# Patient Record
Sex: Male | Born: 1960
Health system: Southern US, Community
[De-identification: ages and names within clinical notes are randomized; demographics above are authoritative.]

## PROBLEM LIST (undated history)

## (undated) ENCOUNTER — Emergency Department (HOSPITAL_BASED_OUTPATIENT_CLINIC_OR_DEPARTMENT_OTHER): Payer: Medicare Other | Source: Home / Self Care

## (undated) DIAGNOSIS — Z720 Tobacco use: Secondary | ICD-10-CM

## (undated) DIAGNOSIS — E785 Hyperlipidemia, unspecified: Secondary | ICD-10-CM

## (undated) DIAGNOSIS — I10 Essential (primary) hypertension: Secondary | ICD-10-CM

## (undated) DIAGNOSIS — J449 Chronic obstructive pulmonary disease, unspecified: Secondary | ICD-10-CM

## (undated) DIAGNOSIS — I639 Cerebral infarction, unspecified: Secondary | ICD-10-CM

## (undated) DIAGNOSIS — N35919 Unspecified urethral stricture, male, unspecified site: Secondary | ICD-10-CM

## (undated) DIAGNOSIS — M545 Low back pain, unspecified: Secondary | ICD-10-CM

## (undated) DIAGNOSIS — G40A09 Absence epileptic syndrome, not intractable, without status epilepticus: Secondary | ICD-10-CM

## (undated) DIAGNOSIS — R413 Other amnesia: Secondary | ICD-10-CM

## (undated) DIAGNOSIS — I629 Nontraumatic intracranial hemorrhage, unspecified: Secondary | ICD-10-CM

## (undated) DIAGNOSIS — Z87442 Personal history of urinary calculi: Secondary | ICD-10-CM

## (undated) DIAGNOSIS — R569 Unspecified convulsions: Secondary | ICD-10-CM

## (undated) DIAGNOSIS — Z86010 Personal history of colonic polyps: Secondary | ICD-10-CM

## (undated) DIAGNOSIS — G43909 Migraine, unspecified, not intractable, without status migrainosus: Secondary | ICD-10-CM

## (undated) DIAGNOSIS — I219 Acute myocardial infarction, unspecified: Secondary | ICD-10-CM

## (undated) DIAGNOSIS — G8929 Other chronic pain: Secondary | ICD-10-CM

## (undated) DIAGNOSIS — D229 Melanocytic nevi, unspecified: Secondary | ICD-10-CM

## (undated) DIAGNOSIS — Z860101 Personal history of adenomatous and serrated colon polyps: Secondary | ICD-10-CM

## (undated) DIAGNOSIS — I2510 Atherosclerotic heart disease of native coronary artery without angina pectoris: Secondary | ICD-10-CM

## (undated) DIAGNOSIS — K219 Gastro-esophageal reflux disease without esophagitis: Secondary | ICD-10-CM

## (undated) HISTORY — DX: Cerebral infarction, unspecified: I63.9

## (undated) HISTORY — DX: Acute myocardial infarction, unspecified: I21.9

## (undated) HISTORY — PX: LUMBAR DISC SURGERY: SHX700

## (undated) HISTORY — DX: Nontraumatic intracranial hemorrhage, unspecified: I62.9

## (undated) HISTORY — DX: Atherosclerotic heart disease of native coronary artery without angina pectoris: I25.10

## (undated) HISTORY — PX: COLONOSCOPY W/ POLYPECTOMY: SHX1380

## (undated) HISTORY — DX: Other amnesia: R41.3

## (undated) HISTORY — DX: Melanocytic nevi, unspecified: D22.9

## (undated) HISTORY — DX: Hyperlipidemia, unspecified: E78.5

## (undated) HISTORY — DX: Personal history of colonic polyps: Z86.010

## (undated) HISTORY — DX: Chronic obstructive pulmonary disease, unspecified: J44.9

## (undated) HISTORY — PX: TONSILLECTOMY: SUR1361

## (undated) HISTORY — DX: Migraine, unspecified, not intractable, without status migrainosus: G43.909

## (undated) HISTORY — DX: Unspecified urethral stricture, male, unspecified site: N35.919

## (undated) HISTORY — DX: Personal history of adenomatous and serrated colon polyps: Z86.0101

---

## 1979-02-09 HISTORY — PX: CRANIOTOMY FOR AVM: SUR332

## 2002-01-30 ENCOUNTER — Encounter: Payer: Self-pay | Admitting: Neurology

## 2002-01-30 ENCOUNTER — Encounter: Admission: RE | Admit: 2002-01-30 | Discharge: 2002-01-30 | Payer: Self-pay | Admitting: Neurology

## 2002-11-22 ENCOUNTER — Ambulatory Visit (HOSPITAL_COMMUNITY): Admission: RE | Admit: 2002-11-22 | Discharge: 2002-11-22 | Payer: Self-pay | Admitting: Neurology

## 2005-07-27 ENCOUNTER — Encounter: Admission: RE | Admit: 2005-07-27 | Discharge: 2005-07-27 | Payer: Self-pay | Admitting: Neurology

## 2005-08-02 ENCOUNTER — Ambulatory Visit (HOSPITAL_COMMUNITY): Admission: RE | Admit: 2005-08-02 | Discharge: 2005-08-02 | Payer: Self-pay | Admitting: Neurology

## 2005-08-10 ENCOUNTER — Encounter: Admission: RE | Admit: 2005-08-10 | Discharge: 2005-08-10 | Payer: Self-pay | Admitting: Neurology

## 2005-08-30 ENCOUNTER — Encounter: Admission: RE | Admit: 2005-08-30 | Discharge: 2005-08-30 | Payer: Self-pay | Admitting: Neurology

## 2005-09-21 ENCOUNTER — Encounter: Admission: RE | Admit: 2005-09-21 | Discharge: 2005-09-21 | Payer: Self-pay | Admitting: Neurology

## 2006-10-03 ENCOUNTER — Encounter: Admission: RE | Admit: 2006-10-03 | Discharge: 2006-10-03 | Payer: Self-pay | Admitting: Neurosurgery

## 2006-11-11 ENCOUNTER — Inpatient Hospital Stay (HOSPITAL_COMMUNITY): Admission: RE | Admit: 2006-11-11 | Discharge: 2006-11-13 | Payer: Self-pay | Admitting: Neurosurgery

## 2006-12-15 ENCOUNTER — Encounter: Admission: RE | Admit: 2006-12-15 | Discharge: 2007-01-16 | Payer: Self-pay | Admitting: Neurosurgery

## 2007-01-20 ENCOUNTER — Ambulatory Visit (HOSPITAL_COMMUNITY): Admission: RE | Admit: 2007-01-20 | Discharge: 2007-01-20 | Payer: Self-pay | Admitting: Neurosurgery

## 2007-02-07 ENCOUNTER — Encounter: Admission: RE | Admit: 2007-02-07 | Discharge: 2007-02-07 | Payer: Self-pay | Admitting: Neurosurgery

## 2007-02-09 DIAGNOSIS — I219 Acute myocardial infarction, unspecified: Secondary | ICD-10-CM

## 2007-02-09 HISTORY — DX: Acute myocardial infarction, unspecified: I21.9

## 2007-02-09 HISTORY — PX: CORONARY ANGIOPLASTY WITH STENT PLACEMENT: SHX49

## 2007-02-20 ENCOUNTER — Encounter: Admission: RE | Admit: 2007-02-20 | Discharge: 2007-02-20 | Payer: Self-pay | Admitting: Neurosurgery

## 2007-02-22 ENCOUNTER — Ambulatory Visit (HOSPITAL_COMMUNITY): Admission: RE | Admit: 2007-02-22 | Discharge: 2007-02-23 | Payer: Self-pay | Admitting: Neurosurgery

## 2007-03-28 ENCOUNTER — Encounter: Admission: RE | Admit: 2007-03-28 | Discharge: 2007-05-02 | Payer: Self-pay | Admitting: Neurosurgery

## 2007-05-09 ENCOUNTER — Encounter: Admission: RE | Admit: 2007-05-09 | Discharge: 2007-05-09 | Payer: Self-pay | Admitting: Neurosurgery

## 2007-06-01 ENCOUNTER — Encounter: Admission: RE | Admit: 2007-06-01 | Discharge: 2007-06-01 | Payer: Self-pay | Admitting: Neurosurgery

## 2007-06-22 ENCOUNTER — Encounter: Admission: RE | Admit: 2007-06-22 | Discharge: 2007-06-22 | Payer: Self-pay | Admitting: Neurosurgery

## 2007-08-08 ENCOUNTER — Ambulatory Visit (HOSPITAL_COMMUNITY): Admission: RE | Admit: 2007-08-08 | Discharge: 2007-08-08 | Payer: Self-pay | Admitting: Neurosurgery

## 2007-09-18 ENCOUNTER — Inpatient Hospital Stay (HOSPITAL_COMMUNITY): Admission: AD | Admit: 2007-09-18 | Discharge: 2007-09-21 | Payer: Self-pay | Admitting: Cardiology

## 2007-10-02 ENCOUNTER — Inpatient Hospital Stay (HOSPITAL_COMMUNITY): Admission: AD | Admit: 2007-10-02 | Discharge: 2007-10-03 | Payer: Self-pay | Admitting: Interventional Cardiology

## 2007-10-17 ENCOUNTER — Encounter: Admission: RE | Admit: 2007-10-17 | Discharge: 2007-10-17 | Payer: Self-pay | Admitting: Neurology

## 2007-10-26 ENCOUNTER — Encounter (HOSPITAL_COMMUNITY): Admission: RE | Admit: 2007-10-26 | Discharge: 2008-01-24 | Payer: Self-pay | Admitting: Interventional Cardiology

## 2008-01-25 ENCOUNTER — Encounter (HOSPITAL_COMMUNITY): Admission: RE | Admit: 2008-01-25 | Discharge: 2008-02-07 | Payer: Self-pay | Admitting: Interventional Cardiology

## 2008-09-10 ENCOUNTER — Encounter: Admission: RE | Admit: 2008-09-10 | Discharge: 2008-09-10 | Payer: Self-pay | Admitting: Neurology

## 2009-02-08 HISTORY — PX: CARDIAC CATHETERIZATION: SHX172

## 2009-03-25 DIAGNOSIS — R0602 Shortness of breath: Secondary | ICD-10-CM | POA: Insufficient documentation

## 2009-12-23 ENCOUNTER — Encounter: Admission: RE | Admit: 2009-12-23 | Discharge: 2009-12-23 | Payer: Self-pay | Admitting: Neurosurgery

## 2010-02-11 ENCOUNTER — Encounter
Admission: RE | Admit: 2010-02-11 | Discharge: 2010-02-11 | Payer: Self-pay | Source: Home / Self Care | Attending: Neurosurgery | Admitting: Neurosurgery

## 2010-02-26 ENCOUNTER — Encounter
Admission: RE | Admit: 2010-02-26 | Discharge: 2010-02-26 | Payer: Self-pay | Source: Home / Self Care | Attending: Neurosurgery | Admitting: Neurosurgery

## 2010-03-02 ENCOUNTER — Encounter: Payer: Self-pay | Admitting: Neurosurgery

## 2010-06-23 NOTE — Discharge Summary (Signed)
NAME:  Peter Barnett, Peter Barnett               ACCOUNT NO.:  [id number]   MEDICAL RECORD NO.:  [id number]          PATIENT TYPE:  OIB   LOCATION:  2025                         FACILITY:  [facility]   PHYSICIAN:  Corky Crafts, MDDATE OF BIRTH:  1960-09-30   DATE OF ADMISSION:  09/18/2007  DATE OF DISCHARGE:  09/21/2007                               DISCHARGE SUMMARY   DISCHARGE DIAGNOSES:  1. ST-elevated lateral wall myocardial infarction.  2. Dyslipidemia, treated.  3. Residual right coronary artery disease.  4. Chronic back problem status post surgery.  5. History of seizure disorder.  6. 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  7. Long-term medication use.   HOSPITAL COURSE:  Peter Barnett is a 50 year old male patient who began  having chest pain on the date of admission that waxed and waned in  intensity.  He initially called the EMS and this showed ST-segment  elevation inferiorly and laterally.  He was brought to [facility] for  emergent cardiac catheterization.  He is found to have 99% mid  circumflex lesion with thrombus, TIMI 1 flow.  He had a 40% proximal RCA  stenosis and diffuse 95% distal RCA stenosis with left-to-right  collaterals distal to the right coronary artery.  The LAD had a 50%  proximal lesion.  The left main had no obstructive disease.  A drug-  eluting stent was placed into the circumflex lesion without difficulty.   Through his hospitalization, he did have a couple of short runs of  ventricular tachycardia which was thought to be reperfusion arrhythmia.  He does have the residual right coronary artery disease, and this will  be treated medically at this point.   Lab studies during the patient's hospitalization include a hemoglobin of  14.0, hematocrit 40.7, white count 10, and platelets 254.  Sodium 130,  potassium 3.7, BUN 9, and creatinine 0.7.  PT 13.8, INR 1.0, maximum  troponin 71.65, CK 3155 with an MB fraction of 277.5.  Hemoglobin  A1c  5.4.   DISCHARGE MEDICATIONS:  1. Multivitamin daily.  2. Vicodin p.r.n.  3. Oxcarbazepine as per admission.  4. Enteric-coated aspirin 325 mg a day.  5. Plavix 75 mg a day.  6. Lopressor 25 mg twice a day.  7. Crestor 20 mg a day.  8. Sublingual nitroglycerin p.r.n. chest pain.   The patient is to remain on a low-sodium, heart-healthy diet, increase  activity slowly.  Has been instructed by cardiac rehabilitation, no  lifting over 10 pounds for 1 week.  No driving for 2 days.  Clean cath  site gently with soap and water, no scrubbing.  Follow up with Dr.  Eldridge Dace on October 06, 2007, at 9 a.m.      Guy Franco, P.A.      Corky Crafts, MD  Electronically Signed    LB/MEDQ  D:  09/21/2007  T:  09/22/2007  Job:  [id number]   cc:   Genene Churn. Love, M.D.  Joycelyn Rua, M.D.  Corky Crafts, MD

## 2010-06-23 NOTE — H&P (Signed)
NAME:  Peter Barnett, Peter Barnett               ACCOUNT NO.:  [id number]   MEDICAL RECORD NO.:  [id number]          PATIENT TYPE:  AMB   LOCATION:  SDS                          FACILITY:  [facility]   PHYSICIAN:  Hilda Lias, M.D.   DATE OF BIRTH:  [birth year]   DATE OF ADMISSION:  02/22/2007  DATE OF DISCHARGE:                              HISTORY & PHYSICAL   HISTORY:  Peter Barnett is a gentleman who underwent decompressive laminectomy  because of back pain worse to both legs.  Nevertheless, although at the  beginning he did well and then realized that he was having more pain  down to the left leg.  The patient had conservative treatment including  epidural injection.  The first one at the level of L3-L4 did improve the  pain, but the one at the level of L4-L5 did not.  X-ray was done and  although he has had good decompression, nevertheless showed that he has  worsening stenosis at the level of L4-L5 left with intraforaminal disk  and extraforaminal at the level of L3-L4 to the left.  The patient wants  to proceed with surgery.   PAST MEDICAL HISTORY:  1. Craniotomy for AVM in 1981.  2. Lumbar laminectomy last year.   ALLERGIES:  NONE.   SOCIAL HISTORY:  The patient does not smoke.  He drinks socially.   FAMILY HISTORY:  Mother had back surgery.  Father has a history of  carcinoma of the lung.   REVIEW OF SYSTEMS:  Positive for back pain and seizure.   PHYSICAL EXAMINATION:  GENERAL:  The patient presents to my office.  He  was limping from the left leg.  HEAD/EYES/EARS/NOSE/THROAT:  Normal.  NECK:  He has a scar defect from previous craniotomy surgery.  Neck is  normal.  LUNGS:  Clear.  HEART:  Sounds are normal.  ABDOMEN:  Normal.  EXTREMITIES:  Normal pulses.  NEURO:  Showed that he has positive sciatic notch tenderness on the left  side.  The straight leg raising, SLR is positive at 80 degrees.  The  right leg is completely normal.   Review of the new myelogram showed that he has  stenosis at L4-L5 to the  left with intra-extraforaminal disk at L3-L4 to the left.   CLINICAL IMPRESSION:  Status post lumbar laminectomy, L4-L5 stenosis  left, L3-L4 intra-extraforaminal herniated disk.   RECOMMENDATIONS:  The patient is being admitted for surgery.  Procedure  will be L3-L4 and L4-L5 intraforaminal foraminotomy and pleural  diskectomy as well as approach at the levels of L3-L4 extraforaminal.  The patient knows about the risks of post-surgery infection, CSF leak,  worsening pain, paralysis, no improvement whatsoever.           ______________________________  Hilda Lias, M.D.     EB/MEDQ  D:  02/22/2007  T:  02/22/2007  Job:  [id number]

## 2010-06-23 NOTE — Op Note (Signed)
NAME:  Peter Barnett, Peter Barnett               ACCOUNT NO.:  [id number]   MEDICAL RECORD NO.:  [id number]          PATIENT TYPE:  INP   LOCATION:  3172                         FACILITY:  [facility]   PHYSICIAN:  Hilda Lias, M.D.   DATE OF BIRTH:  1960-09-25   DATE OF PROCEDURE:  11/11/2006  DATE OF DISCHARGE:                               OPERATIVE REPORT   PREOPERATIVE DIAGNOSIS:  Lumbar stenosis L3-L4, L4-L5, and L5-S1, with  neurogenic claudication.   POSTOPERATIVE DIAGNOSIS:  Lumbar stenosis L3-L4, L4-L5, and L5-S1, with  neurogenic claudication.   PROCEDURE:  Bilateral L3, L4, and L5 laminectomy, foraminotomy to  decompress the L3, L4, L5, and S1 nerve roots, posterolateral  arthrodesis with autograft and BMP.   SURGEON:  Hilda Lias, M.D.   ASSISTANT:  Coletta Memos, M.D.   CLINICAL HISTORY:  The patient has been followed by me for more than a  year complaining of back pain with radiation to both legs, left worse  than right.  He has failed conservative treatment.  X-rays show stenosis  in the lumbar area.  Surgery was advised.   OPERATIVE PROCEDURE:  The patient was taken to the OR and he was  positioned in a prone manner.  The wound was cleaned with DuraPrep.  Midline incision from L3 down to L5-S1 was made.  X-rays showed that it  was at the level of four.  From then on, we removed the spinous process  of L3, L4, and L5.  With the drill, we drilled the lamina of L3, L4, and  L5.  We found that he has a thick yellow ligament.  The area was quite  stenotic up to the point that we had to use the 1 and 2 mm Kerrison  punch to remove the yellow ligament and continue with the laminectomies.  Preservation of the facet was done.  Then, using the 2, 3, and 4 mm  Kerrison punch, we did foraminotomy to decompress the L3 to S1 nerve  root.  Having good decompression, the area was irrigated.  We went  laterally and we removed the periosteum of the lateral aspect of the  facet.  Then, a mix  of autograft using the patient's own bone and BMP  was used for arthrodesis.  The area was irrigated.  Valsalva maneuver  was negative.  Fentanyl was left in the epidural space and the wound was  closed with Vicryl and Steri-Strips.           ______________________________  Hilda Lias, M.D.     EB/MEDQ  D:  11/11/2006  T:  11/12/2006  Job:  [id number]

## 2010-06-23 NOTE — H&P (Signed)
NAME:  Peter Barnett, Peter Barnett               ACCOUNT NO.:  [id number]   MEDICAL RECORD NO.:  [id number]          PATIENT TYPE:  INP   LOCATION:  2899                         FACILITY:  [facility]   PHYSICIAN:  Hilda Lias, M.D.   DATE OF BIRTH:  12-Aug-1960   DATE OF ADMISSION:  11/11/2006  DATE OF DISCHARGE:                              HISTORY & PHYSICAL   Mr. Pentecost is a gentleman who had been seen by me for more than a year  complaining of back pain with radiation first to the left leg and now to  both legs.  He tells me that the pain is getting worse.  He is quite  miserable.  He has had conservative treatment without any improvement.  X-rays show stenosis from L3 down to L5-S1.  Today he is being admitted  for surgery.   PAST MEDICAL HISTORY:  He had a craniotomy in 1981 for AVM.  He is not  allergic to any medication.  The patient drinks socially.  He does not  smoke.   FAMILY HISTORY:  Mother is 85 years old with back surgery.   REVIEW OF SYSTEMS:  Positive for shortness of breath, seizures, problem  with memory.   PHYSICAL EXAMINATION:  The patient came to my office with his family.  He was walking with a short step.  HEAD, EAR, NOSE AND THROAT:  Normal.  NECK:  Normal.  LUNGS:  Clear.  HEART SOUNDS:  Normal.  ABDOMEN:  Normal.  EXTREMITIES:  Normal pulses.  NEURO:  He has a craniotomy defect from previous surgery.  He has a  weakness on dorsiflexion of both legs.  Straight leg raising:  SLR is  positive about 45 degrees.   X-rays show stenosis from L3 down to L4-L5 with a bulging disk eccentric  to the left side.   CLINICAL IMPRESSION:  Lumbar stenosis L3-4-5.   RECOMMENDATIONS:  The patient is being admitted for surgery.  The  procedure will be a bilateral 3-4-5 laminectomy.  We are going to use  his own bone for posterolateral arthrodesis.  Also, we probably will do  BMP.  The patient is fully aware of the procedure and the risks such as  wound infection, CSF leak, damage  to the vessels of the abdomen, and  need of further surgery which might require hardware.           ______________________________  Hilda Lias, M.D.     EB/MEDQ  D:  11/11/2006  T:  11/12/2006  Job:  [id number]

## 2010-06-23 NOTE — Cardiovascular Report (Signed)
NAME:  Peter Barnett, Peter Barnett               ACCOUNT NO.:  [id number]   MEDICAL RECORD NO.:  [id number]          PATIENT TYPE:  OIB   LOCATION:  2906                         FACILITY:  [facility]   PHYSICIAN:  Peter M. Swaziland, M.D.  DATE OF BIRTH:  May 31, 1960   DATE OF PROCEDURE:  09/18/2007  DATE OF DISCHARGE:                            CARDIAC CATHETERIZATION   INDICATIONS FOR PROCEDURE:  This is a 50 year old white male who  presents with acute onset of chest pain.  ECG performed by EMS shows 3-4  mm of ST-segment elevation in leads II, III, aVF; V5 through V6  consistent with acute inferolateral myocardial infarction.   PROCEDURES:  1. Left heart catheterization.  2. Coronary and left ventricular angiography.  3. Intracoronary stenting of the mid left circumflex coronary artery.  4. Access is via the right femoral artery using standard Seldinger      technique.   EQUIPMENTS USED:  A 6-French 4 cm right and left Judkins catheter, 6-  French pigtail catheter, 6-French arterial sheath, 6-French CLS 3.5  guide, Asahi medium wire, a wedge catheter, a 2.5 x 12-mm apex balloon,  a 3.0 x 15-mm Promus stent, and a 3.0 x 12-mm Voyager Corrigan balloon.   MEDICATIONS:  1. Plavix 600 mg orally.  2. Heparin bolus at 5700 units IV.  3. Subsequent ACT of 295.  4. Integrilin double bolus at 180 mcg/kg.  Continue infusion.  5. Nitroglycerin 200 mcg intracoronary x1.   HEMODYNAMIC DATA:  Aortic pressure was 141/84 with a mean of 108 mmHg  and left ventricle pressure was 144 with an EDP of 16 mmHg.   ANGIOGRAPHIC DATA:  The left coronary arises and distributes normally.  The left main coronary is short without significant disease.   The left anterior descending artery has a 40-50% stenosis in the  proximal vessel.  The remainder of the vessel is without significant  disease.   The left circumflex coronary artery has a 99% obstruction in the  midvessel after the takeoff of the first obtuse marginal vessel.   There  is thrombus in this vessel with TIMI grade 1 flow distally.  The first  obtuse marginal vessel has 40% narrowing proximally.   The right coronary is a smaller caliber vessel, has a 40% lesion  proximally.  The distal vessel has a long 99% stenosis.  The distal  right coronary artery fills by left to right collaterals.   Based on initial angiographic data, it appears that the left circumflex  coronary artery was the culprit lesion.  The distal right coronary  obstruction appears chronic and as well collateralized.  We proceeded  with acute intervention of the left circumflex coronary artery.  After  initial guide shots were obtained, the patient was anticoagulated.  We  were able to cross the lesion without difficulty using a wire.  We next  used a wedge catheter, performed 2 passes with this extracting a small  amount of clot.  We then predilated the lesion using a 2.5 x 12-mm apex  balloon up to 6 atmospheres x1.  The lesion was then stented using a 3.0  x 15-mm Promus stent, which was deployed at 9 atmospheres and  postdilated to 12 atmospheres with the stent balloon.  As the plaque  originated prior to the first obtuse marginal vessel, the stent actually  covered the origin of the first marginal vessel.  After initial  reperfusion was obtained with the wedge catheter, there was some brief  episodes accelerated idioventricular rhythm that then resolved.  The use  of thrombus extraction device did delay the time to first balloon by  matter of several minutes.   We then postdilated the stent using a 3.0 x 12-mm VOYAGER [place] balloon  doing 2 inflations at 14 atmospheres each.  This yielded an excellent  angiographic result with 0% residual stenosis and TIMI grade 3 flow.  There is no significant compromise of the first obtuse marginal branch.  The patient was hemodynamically stable, pain free, and had improvement  in his ST-segment elevation and we will monitor.   At this point,  we did perform a left ventricular angiogram in the RAO  view.  This demonstrated normal left ventricular size and contractility  with normal systolic function.  Ejection fraction was estimated at 60%.   FINAL INTERPRETATION:  1. Two-vessel obstructive atherosclerotic coronary artery disease.  2. Successful intracoronary stenting of the mid left circumflex      coronary artery.  3. Normal left ventricular function.           ______________________________  Peter M. [place]azil[place], M.D.     PMJ/MEDQ  D:  09/18/2007  T:  09/19/2007  Job:  [id number]   cc:   Corky Crafts, MD  Joycelyn Rua, M.D.  Genene Churn. Love, M.D.  Hilda Lias, M.D.

## 2010-06-23 NOTE — Op Note (Signed)
NAME:  Peter Barnett, Peter Barnett               ACCOUNT NO.:  [id number]   MEDICAL RECORD NO.:  [id number]          PATIENT TYPE:  OIB   LOCATION:  3022                         FACILITY:  [facility]   PHYSICIAN:  Hilda Lias, M.D.   DATE OF BIRTH:  12/22/1960   DATE OF PROCEDURE:  02/22/2007  DATE OF DISCHARGE:                               OPERATIVE REPORT   PREOPERATIVE DIAGNOSIS:  Left L3-4, L4-5 foraminal stenosis with  herniated disk status post lumbar laminectomy.   POSTOPERATIVE DIAGNOSIS:  Left L3-4, L4-5 foraminal stenosis with  herniated disk status post lumbar laminectomy.   PROCEDURE:  Left L3-4 extraforaminal diskectomy, left L4-5 and L3-4  intraforaminal decompression.  Microscopic video.   SURGEON:  Hilda Lias, M.D.   ASSISTANT:  Dr. Venetia Maxon.   CLINICAL HISTORY:  The patient underwent a lumbar laminectomy several  months ago because of claudication.  Although he did well now he is  complaining of worsening of the pain down to the left leg.  He had  failed conservative treatment including epidural injection.  X-rays  showed probable worsening of his herniated disk at the foramen at the L3-  4 with a foraminal stenosis at the L4-5.  Surgery was advised.   PROCEDURE IN DETAIL:  The patient was taken to the OR and he was  positioned in a prone manner.  The skin was cleaned with DuraPrep.  Midline incision following the previous wound was made and the muscle  retracted on the left side.  With the microscope we were able to  identify the opening in the midline and we did our retraction laterally.  X-rays showed that indeed we were at the level of L3-4 and  intraforaminal at the L4-5.  With the microscope we drilled the lamina  of L4-5, we identified the L4 and L5 nerve root.  There was quite some  narrowing compromising the L5 nerve root.  Using the 1 and 2 mm Kerrison  punch and later on a 3 mm we were able to decompress the L4 and L5 nerve  root.  Then our attention was at the  intraforaminal space at the level  of L3-4.  Retraction was made and the inferior transverse ligament was  also excised.  The L3 nerve root was found.  Indeed there were two  components, one calcified and one soft, at that level compromising the  takeoff of L3.  Incision was made and diskectomy was achieved to  decompress the L3 nerve root.  At the end we had a good decompression.  The area was irrigated.  Fentanyl and Depo-Medrol were left in the  pleural space and the wound was closed with Vicryl and Steri-Strips.           ______________________________  Hilda Lias, M.D.     EB/MEDQ  D:  02/22/2007  T:  02/22/2007  Job:  [id number]

## 2010-06-23 NOTE — H&P (Signed)
NAME:  Peter Barnett, Peter Barnett               ACCOUNT NO.:  [id number]   MEDICAL RECORD NO.:  [id number]          PATIENT TYPE:  OIB   LOCATION:  2906                         FACILITY:  [facility]   PHYSICIAN:  Peter M. Swaziland, M.D.  DATE OF BIRTH:  06-Jan-1961   DATE OF ADMISSION:  09/18/2007  DATE OF DISCHARGE:                              HISTORY & PHYSICAL   HISTORY OF PRESENT ILLNESS:  Peter Barnett is a 50 year old white male, who  presented by [facility] today with an acute episode of chest  pain.  The pain started while he was watching a race this afternoon  approximately 3:00 p.m.  This pain waxed and waned in intensity.  His  pain was central his chest radiating to his left arm.  Initial ECG by  EMS showed mild ST elevation inferiorly.  When the patient got into the  ambulance, his pain worsened and ECG showed 3-4 mm of ST-segment  elevation in leads II, III, aVF, V5, and V6.  His pain on the way to  emergency room subsequently subsided, was 1/10 on arrival to the cath  lab.  The patient apparently had a stress test done approximately 2  years ago, some abnormality was seen.  There was discussion about  possible cardiac catheterization, but this was apparently not pursued  due to some concern over his neurologic status.   PAST MEDICAL HISTORY:  He has had previous surgery to clip an AV  malformation in his brain in [place] in 1981.  He has a history of  seizure disorder.  He has had two back surgeries this past year.   MEDICATIONS:  Vicodin 5/500 mg t.i.d., oxcarbazepine 600 mg t.i.d.,  multivitamin daily, aspirin daily.  The patient apparently stopped  Topamax 2 weeks ago.  He saw Dr. Sandria Manly today and was started on new  medication, but he has not yet filled it.   ALLERGIES:  He has no known allergies.   SOCIAL HISTORY:  The patient is disabled due to his back.  He denies  tobacco or alcohol use.  He is married.   FAMILY HISTORY:  Unknown.   REVIEW OF SYSTEMS:  He has had  some recent diarrhea.  Wife reports that  his cholesterol was as high as 250.  The patient has had recent problems  with short-term memory loss.   PHYSICAL EXAMINATION:  The patient is a well-developed white male in no  apparent distress.  Blood pressure 130/70, pulse 75 and regular,  temperature is 99.4.  His HEENT is unremarkable.  He has no JVD or  bruits.  Lungs are clear.  Cardiac exam reveals regular rate and rhythm  without gallop, murmur, rub, or click.  Abdomen is soft and nontender.  He has no masses or bruits.  Femoral and pedal pulses were 2+ and  symmetric.  Neurologic exam is nonfocal.   LABORATORY DATA:  ECG is as noted in HPI.   IMPRESSION:  1. Acute inferolateral myocardial infarction.  2. 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  3. History of seizure disorder.  4. Hypercholesterolemia.  5.  Chronic back problems status post surgery.   PLAN:  We will undergo emergent cardiac catheterization with potential  percutaneous catheter intervention.  The patient will be treated with  aggressive lipid-lowering therapy, aspirin, Plavix, and beta blocker.           ______________________________  Peter M. [place], M.D.     PMJ/MEDQ  D:  09/18/2007  T:  09/19/2007  Job:  [id number]   cc:   Genene Churn. Love, M.D.  Joycelyn Rua, M.D.  Corky Crafts, MD

## 2010-10-01 ENCOUNTER — Other Ambulatory Visit: Payer: Self-pay | Admitting: Neurosurgery

## 2010-10-01 DIAGNOSIS — M5410 Radiculopathy, site unspecified: Secondary | ICD-10-CM

## 2010-10-05 ENCOUNTER — Ambulatory Visit
Admission: RE | Admit: 2010-10-05 | Discharge: 2010-10-05 | Disposition: A | Discharge status: Home / Self Care | Payer: Medicare Other | Source: Ambulatory Visit | Attending: Neurosurgery | Admitting: Neurosurgery

## 2010-10-05 ENCOUNTER — Other Ambulatory Visit: Payer: Self-pay | Admitting: Neurosurgery

## 2010-10-05 DIAGNOSIS — M5410 Radiculopathy, site unspecified: Secondary | ICD-10-CM

## 2010-10-05 MED ORDER — IOHEXOL 300 MG/ML  SOLN: 1.0000 mL | Freq: Once | INTRAMUSCULAR | Status: DC | PRN

## 2010-10-05 MED ORDER — METHYLPREDNISOLONE ACETATE 40 MG/ML INJ SUSP (RADIOLOG
120.0000 mg | Freq: Once | INTRAMUSCULAR | Status: AC
  Administered 2010-10-05: 120 mg via EPIDURAL

## 2010-10-05 MED ORDER — IOHEXOL 180 MG/ML  SOLN
1.0000 mL | Freq: Once | INTRAMUSCULAR | Status: AC | PRN
  Administered 2010-10-05: 1 mL via EPIDURAL

## 2010-10-05 MED ORDER — TRIAMCINOLONE ACETONIDE 40 MG/ML IJ SUSP (RADIOLOGY): 40.0000 mg | Freq: Once | INTRAMUSCULAR | Status: DC

## 2010-10-28 LAB — CBC
MCHC: 34.8
RBC: 5.21
WBC: 9.1

## 2010-11-06 LAB — COMPREHENSIVE METABOLIC PANEL
ALT: 55 — ABNORMAL HIGH
AST: 198 — ABNORMAL HIGH
CO2: 21
Calcium: 8.5
GFR calc Af Amer: >60
GFR calc non Af Amer: >60
Potassium: 3.6
Sodium: 131 — ABNORMAL LOW

## 2010-11-06 LAB — CBC
HCT: 41.6
Hemoglobin: 14.2
MCHC: 34.3
MCV: 88.8
RBC: 4.68
RDW: 13.5
WBC: 10.1

## 2010-11-06 LAB — TROPONIN I
Troponin I: 32.65
Troponin I: 71.65

## 2010-11-06 LAB — BASIC METABOLIC PANEL
CO2: 23
CO2: 24
Calcium: 8.9
Chloride: 100
Creatinine, Ser: 0.7
Creatinine, Ser: 0.76
GFR calc Af Amer: >60
Glucose, Bld: 118 — ABNORMAL HIGH
Potassium: 3.7

## 2010-11-06 LAB — LIPID PANEL
HDL: 32 — ABNORMAL LOW
Triglycerides: 156 — ABNORMAL HIGH
VLDL: 31

## 2010-11-06 LAB — CK TOTAL AND CKMB (NOT AT ARMC)
CK, MB: 234 — ABNORMAL HIGH
Total CK: 1764 — ABNORMAL HIGH
Total CK: 3155 — ABNORMAL HIGH
Total CK: 974 — ABNORMAL HIGH

## 2010-11-06 LAB — URINE CULTURE
Colony Count: NO GROWTH
Special Requests: NEGATIVE

## 2010-11-06 LAB — URINALYSIS, ROUTINE W REFLEX MICROSCOPIC
Nitrite: NEGATIVE
Specific Gravity, Urine: 1.023
pH: 6

## 2010-11-19 LAB — CBC
HCT: 49.6
Hemoglobin: 17
MCV: 89
Platelets: 231
WBC: 7.6

## 2010-11-19 LAB — BASIC METABOLIC PANEL
BUN: 10
Chloride: 110
GFR calc non Af Amer: >60
Potassium: 4.5
Sodium: 137

## 2010-12-29 ENCOUNTER — Other Ambulatory Visit: Payer: Self-pay | Admitting: Neurosurgery

## 2010-12-29 DIAGNOSIS — M5410 Radiculopathy, site unspecified: Secondary | ICD-10-CM

## 2011-01-04 ENCOUNTER — Ambulatory Visit
Admission: RE | Admit: 2011-01-04 | Discharge: 2011-01-04 | Disposition: A | Discharge status: Home / Self Care | Payer: Medicare Other | Source: Ambulatory Visit | Attending: Neurosurgery | Admitting: Neurosurgery

## 2011-01-04 DIAGNOSIS — M5410 Radiculopathy, site unspecified: Secondary | ICD-10-CM

## 2011-01-04 MED ORDER — METHYLPREDNISOLONE ACETATE 40 MG/ML INJ SUSP (RADIOLOG
120.0000 mg | Freq: Once | INTRAMUSCULAR | Status: AC
  Administered 2011-01-04: 120 mg via EPIDURAL

## 2011-01-04 MED ORDER — IOHEXOL 180 MG/ML  SOLN
1.0000 mL | Freq: Once | INTRAMUSCULAR | Status: AC | PRN
  Administered 2011-01-04: 1 mL via EPIDURAL

## 2011-01-14 DIAGNOSIS — I2510 Atherosclerotic heart disease of native coronary artery without angina pectoris: Secondary | ICD-10-CM | POA: Insufficient documentation

## 2011-01-14 DIAGNOSIS — Q282 Arteriovenous malformation of cerebral vessels: Secondary | ICD-10-CM | POA: Insufficient documentation

## 2011-01-14 DIAGNOSIS — I6789 Other cerebrovascular disease: Secondary | ICD-10-CM

## 2011-01-14 DIAGNOSIS — G40909 Epilepsy, unspecified, not intractable, without status epilepticus: Secondary | ICD-10-CM | POA: Insufficient documentation

## 2011-01-14 DIAGNOSIS — K635 Polyp of colon: Secondary | ICD-10-CM | POA: Insufficient documentation

## 2011-01-14 DIAGNOSIS — Z9889 Other specified postprocedural states: Secondary | ICD-10-CM | POA: Insufficient documentation

## 2011-01-14 DIAGNOSIS — Z8774 Personal history of (corrected) congenital malformations of heart and circulatory system: Secondary | ICD-10-CM | POA: Insufficient documentation

## 2011-10-07 ENCOUNTER — Emergency Department (HOSPITAL_COMMUNITY): Payer: Medicare Other

## 2011-10-07 ENCOUNTER — Observation Stay (HOSPITAL_COMMUNITY)
Admission: EM | Admit: 2011-10-07 | Discharge: 2011-10-08 | Disposition: A | Discharge status: Home / Self Care | Payer: Medicare Other | Attending: Interventional Cardiology | Admitting: Interventional Cardiology

## 2011-10-07 ENCOUNTER — Encounter (HOSPITAL_COMMUNITY): Payer: Self-pay

## 2011-10-07 DIAGNOSIS — M545 Low back pain, unspecified: Secondary | ICD-10-CM | POA: Insufficient documentation

## 2011-10-07 DIAGNOSIS — R0989 Other specified symptoms and signs involving the circulatory and respiratory systems: Secondary | ICD-10-CM | POA: Insufficient documentation

## 2011-10-07 DIAGNOSIS — R202 Paresthesia of skin: Secondary | ICD-10-CM | POA: Diagnosis present

## 2011-10-07 DIAGNOSIS — I10 Essential (primary) hypertension: Secondary | ICD-10-CM | POA: Insufficient documentation

## 2011-10-07 DIAGNOSIS — G40909 Epilepsy, unspecified, not intractable, without status epilepticus: Secondary | ICD-10-CM | POA: Insufficient documentation

## 2011-10-07 DIAGNOSIS — I2510 Atherosclerotic heart disease of native coronary artery without angina pectoris: Principal | ICD-10-CM | POA: Insufficient documentation

## 2011-10-07 DIAGNOSIS — G8929 Other chronic pain: Secondary | ICD-10-CM | POA: Insufficient documentation

## 2011-10-07 DIAGNOSIS — M79602 Pain in left arm: Secondary | ICD-10-CM | POA: Diagnosis present

## 2011-10-07 DIAGNOSIS — G43909 Migraine, unspecified, not intractable, without status migrainosus: Secondary | ICD-10-CM

## 2011-10-07 DIAGNOSIS — I252 Old myocardial infarction: Secondary | ICD-10-CM | POA: Insufficient documentation

## 2011-10-07 DIAGNOSIS — R0609 Other forms of dyspnea: Secondary | ICD-10-CM | POA: Insufficient documentation

## 2011-10-07 DIAGNOSIS — R413 Other amnesia: Secondary | ICD-10-CM

## 2011-10-07 DIAGNOSIS — E7800 Pure hypercholesterolemia, unspecified: Secondary | ICD-10-CM | POA: Insufficient documentation

## 2011-10-07 HISTORY — DX: Other chronic pain: G89.29

## 2011-10-07 HISTORY — DX: Acute myocardial infarction, unspecified: I21.9

## 2011-10-07 HISTORY — DX: Other amnesia: R41.3

## 2011-10-07 HISTORY — DX: Low back pain: M54.5

## 2011-10-07 HISTORY — DX: Absence epileptic syndrome, not intractable, without status epilepticus: G40.A09

## 2011-10-07 HISTORY — DX: Low back pain, unspecified: M54.50

## 2011-10-07 HISTORY — DX: Migraine, unspecified, not intractable, without status migrainosus: G43.909

## 2011-10-07 HISTORY — DX: Essential (primary) hypertension: I10

## 2011-10-07 HISTORY — DX: Unspecified convulsions: R56.9

## 2011-10-07 LAB — CBC WITH DIFFERENTIAL/PLATELET
Basophils Absolute: 0 10*3/uL (ref 0.0–0.1)
Basophils Relative: 0 % (ref 0–1)
Eosinophils Absolute: 0.1 10*3/uL (ref 0.0–0.7)
Eosinophils Relative: 1 % (ref 0–5)
HCT: 44.3 % (ref 39.0–52.0)
Hemoglobin: 15.6 g/dL (ref 13.0–17.0)
MCH: 30.2 pg (ref 26.0–34.0)
MCHC: 35.2 g/dL (ref 30.0–36.0)
MCV: 85.9 fL (ref 78.0–100.0)
Monocytes Absolute: 1.2 10*3/uL — ABNORMAL HIGH (ref 0.1–1.0)
Monocytes Relative: 14 % — ABNORMAL HIGH (ref 3–12)
Neutro Abs: 6 10*3/uL (ref 1.7–7.7)
RDW: 13.1 % (ref 11.5–15.5)

## 2011-10-07 LAB — COMPREHENSIVE METABOLIC PANEL
AST: 21 U/L (ref 0–37)
Albumin: 4 g/dL (ref 3.5–5.2)
BUN: 10 mg/dL (ref 6–23)
Calcium: 9.9 mg/dL (ref 8.4–10.5)
Chloride: 100 mEq/L (ref 96–112)
Creatinine, Ser: 0.92 mg/dL (ref 0.50–1.35)
Total Bilirubin: 0.3 mg/dL (ref 0.3–1.2)

## 2011-10-07 LAB — POCT I-STAT TROPONIN I: Troponin i, poc: 0 ng/mL (ref 0.00–0.08)

## 2011-10-07 MED ORDER — HYDROCODONE-ACETAMINOPHEN 5-325 MG PO TABS
2.0000 | ORAL_TABLET | Freq: Once | ORAL | Status: AC
  Administered 2011-10-07: 2 via ORAL
  Filled 2011-10-07: qty 2

## 2011-10-07 MED ORDER — NITROGLYCERIN 0.4 MG SL SUBL
0.4000 mg | SUBLINGUAL_TABLET | SUBLINGUAL | Status: DC | PRN
  Administered 2011-10-07: 0.4 mg via SUBLINGUAL

## 2011-10-07 MED ORDER — HYDROCODONE-ACETAMINOPHEN 10-325 MG PO TABS: 1.0000 | ORAL_TABLET | ORAL | Status: DC

## 2011-10-07 NOTE — ED Notes (Signed)
Per Dr. Romeo Apple, resident - pt to receive nitro in attempt to alleviated pt's c/o left arm numbness. Will continue to monitor.

## 2011-10-07 NOTE — ED Provider Notes (Signed)
History     CSN: [id number]  Arrival date & time 10/07/11  1804   None     Chief Complaint  Patient presents with  . Numbness    "tingling and numbness" to LUE - states similar to his previous MI (2008 or 2009 - pt unsure); denies any other symptoms at this time; states took ASA 325 mg PTA of EMS to residence     (Consider location/radiation/quality/duration/timing/severity/associated sxs/prior treatment) The history is provided by the patient.   the patient is a 51 year old male with a history of MI status post PCI who presents with left arm pain and numbness/tingling consistent with his previous heart attack. The patient notes that his symptoms began at approximately 3:30 PM this afternoon while at rest on his couch. He notes that the pain has persisted until now. He rates his pain as a 4/10. He states that his symptoms are mild to moderate severity and constant in duration. He has associated symptoms of nausea and lightheadedness. He denies any relieving symptoms.  Past Medical History  Diagnosis Date  . HTN (hypertension)   . High cholesterol   . MI (myocardial infarction) 2009    "just had arm pain; never any chest pain"  . Exertional dyspnea 10/07/2011  . 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 Headache 10/07/2011    "3-4 times/month"  . Pneumonia ~ 2010    "2 years in a row; May both times"  . Seizures 1981- present    petit mal  . Petit-mal epilepsy   . Chronic lower back pain     Past Surgical History  Procedure Date  . Brain surgery   . Craniotomy for avm 1981  . Tonsillectomy     "I was little"  . Lumbar disc surgery 2008; 2009  . Coronary angioplasty with stent placement 2009    "3"    History reviewed. No pertinent family history.  History  Substance Use Topics  . Smoking status: Former Smoker -- 3.0 packs/day for 27 years    Types: Cigarettes    Quit date: 02/08/2001  . Smokeless tobacco: Never Used  . Alcohol  Use: 4.2 oz/week    7 Cans of beer per week      Review of Systems  Constitutional: Negative for fever.  HENT: Negative for rhinorrhea, drooling and neck pain.   Eyes: Negative for pain.  Respiratory: Negative for cough and shortness of breath.   Cardiovascular: Negative for chest pain and leg swelling.  Gastrointestinal: Positive for nausea. Negative for vomiting, abdominal pain and diarrhea.  Genitourinary: Negative for dysuria and hematuria.  Musculoskeletal: Negative for gait problem.  Skin: Negative for color change.  Neurological: Positive for light-headedness. Negative for numbness and headaches.  Hematological: Negative for adenopathy.  Psychiatric/Behavioral: Negative for behavioral problems.  All other systems reviewed and are negative.    Allergies  Review of patient's allergies indicates no known allergies.  Home Medications   Current Outpatient Rx  Name Route Sig Dispense Refill  . ASPIRIN 325 MG PO TABS Oral Take 325 mg by mouth daily.    Marland Kitchen VITAMIN D 1000 UNITS PO TABS Oral Take 1,000 Units by mouth daily.    Marland Kitchen HYDROCODONE-ACETAMINOPHEN 10-500 MG PO TABS Oral Take 1 tablet by mouth every 6 (six) hours as needed. For pain    . METOPROLOL TARTRATE 25 MG PO TABS Oral Take 25 mg by mouth 2 (two) times daily.    Marland Kitchen OXCARBAZEPINE  600 MG PO TABS Oral Take 600 mg by mouth 3 (three) times daily.    Marland Kitchen SIMVASTATIN 40 MG PO TABS Oral Take 40 mg by mouth every evening.      BP 106/68  Pulse 49  Temp 98.8 F (37.1 C) (Oral)  Resp 16  SpO2 97%  Physical Exam  Nursing note and vitals reviewed. Constitutional: He is oriented to person, place, and time. He appears well-developed and well-nourished.  HENT:  Head: Normocephalic and atraumatic.  Right Ear: External ear normal.  Left Ear: External ear normal.  Nose: Nose normal.  Mouth/Throat: Oropharynx is clear and moist. No oropharyngeal exudate.  Eyes: Conjunctivae and EOM are normal. Pupils are equal, round, and  reactive to light.  Neck: Normal range of motion. Neck supple.  Cardiovascular: Normal rate, regular rhythm, normal heart sounds and intact distal pulses.  Exam reveals no gallop and no friction rub.   No murmur heard. Pulmonary/Chest: Effort normal and breath sounds normal. No respiratory distress. He has no wheezes.  Abdominal: Soft. Bowel sounds are normal. He exhibits no distension. There is no tenderness. There is no rebound and no guarding.  Musculoskeletal: Normal range of motion. He exhibits no edema and no tenderness.       No ttp of LUE. 2+ distal pulses.  Neurological: He is alert and oriented to person, place, and time.  Skin: Skin is warm and dry.  Psychiatric: He has a normal mood and affect. His behavior is normal.    ED Course  Procedures (including critical care time)  Labs Reviewed  CBC WITH DIFFERENTIAL - Abnormal; Notable for the following:    Monocytes Relative 14 (*)     Monocytes Absolute 1.2 (*)     All other components within normal limits  COMPREHENSIVE METABOLIC PANEL - Abnormal; Notable for the following:    Sodium 134 (*)     Glucose, Bld 117 (*)     All other components within normal limits  POCT I-STAT TROPONIN I   Dg Chest Port 1 View  10/07/2011  *RADIOLOGY REPORT*  Clinical Data: Pain.  PORTABLE CHEST - 1 VIEW  Comparison: Plain film chest 09/18/2007.  Findings: Lungs clear.  Heart size normal.  No pneumothorax or pleural effusion.  IMPRESSION: No acute disease.   Original Report Authenticated By: Bernadene Bell. D'ALESSIO, M.D.      1. Left arm pain   2. Arm paresthesia, left       Date: 10/07/2011  Rate: 67  Rhythm: normal sinus rhythm  QRS Axis: left  Intervals: normal  ST/T Wave abnormalities: normal  Conduction Disutrbances:none  Narrative Interpretation: No ST or T wave changes cw ischemia.   Old EKG Reviewed: unchanged   MDM  11:40 PM 51 y.o. male w hx of HTN, MI s/p PCI (3 stents) on ASA who pw left arm aching and numbness that  began at approx 3:30 pm today while at rest watching tv. Pt notes pain has persisted. Pt AFVSS here, 4/10 left arm pain. He chewed 325mg  ASA at home pta. Will get labs, nitro tabs, plan for cp r/o.   Labs thus far non-contrib. Consulted cards who will admit for cp r/o.   Clinical Impression 1. Left arm pain   2. Arm paresthesia, left            Purvis Sheffield, MD 10/07/11 2341

## 2011-10-07 NOTE — ED Notes (Addendum)
Pt presents w/ c/o left arm numbness and generalized body aches, pt states this feeling is similar to that he experienced in 2008 w/ his last MI. Pt admits nausea - no vomiting - and dizziness/lightheadedness. Pt A&Ox4, skin warm and dry - pt states reports feeling much better at present, states he took an ASA at 1600. Pt denies any needs/requests at this time. Cardiac monitor intact.

## 2011-10-07 NOTE — ED Provider Notes (Signed)
I saw and evaluated the patient, reviewed the resident's note and I agree with the findings including ECG and plan.  Patient essentially asymptomatic again now however have symptoms strikingly similar to his prior heart attack.[address] Cards Eldridge Dace in past  Hurman Horn, MD 10/09/11 [phone]

## 2011-10-07 NOTE — H&P (Signed)
History and Physical  Patient ID: Peter Barnett MRN: [id number], SOB: Jul 15, 1960 51 y.o. Date of Encounter: 10/07/2011, 9:37 PM  Primary Physician: Shawnie Pons, MD Primary Cardiologist: Dr. Eldridge Dace  Chief Complaint: left arm pain  HPI: 51 y.o. male w/ PMHx significant for CAD s/p STEMI in 09/2007 who presented to [facility] on 10/07/2011 with complaints of left arm pain. His admission in 09/2007 was similar in that in was an episode of left arm pain that led to the STEMI and subsequent stenting of his CFX. His current episode started at 3:00 pm after eating a large meal of pasta. Left arm pain and then subsequent "tingling" feeling all over. Mild lightheadedness assoc with symptoms. Also felt mildly nauseated. Left arm pain continued until arrival in the ED when it was abating. Completely resolved with 1 nitro. Currently feels back to baseline.  He reports approx 2 years ago he was admitted to [facility] center with symptoms of shortness of breath (not present with this episode) and underwent a diagnostic catheterization ("normal" per pt, no stents placed).   He also reports have a pharm stress test done at Dr. Hoyle Barr office around 2009-2010.  No exertional symptoms recently. No CHF symptoms. No LE swelling. Compliant with his medications. Hasn't seen a cardiologist in over 2 years.  Last seizure (petit mal) a few weeks ago.  EKG revealed NSR with no acute ST changes, relatively unchanged from prior in 2009. CXR was without acute cardiopulmonary abnormalities. Initial labs benign.  PMH: 1. CAD s/p STEMI in 09/2007, emergently cath'd and found to have 40% prox LAD, 40% prox and 99% distal RCA with collaterals, and 99% LCX which was felt to be the culprit lesion --> thrombectomy and Promus stent. EF 60% by LVgram Reports staged procedure with subsequent stent 3 weeks later, unable to see cath report in EPIC. Cath done ~2011 at [facility] which was "normal" per pt. 2. HTN 3.  Seizure d/o, petite mal 4. S/p craniotomy 1981 for AVM 5. Lower back pain s/p laminectomy and injections    Home Meds: Prior to Admission medications   Medication Sig Start Date End Date Taking? Authorizing Provider  aspirin 325 MG tablet Take 325 mg by mouth daily.   Yes Historical Provider, MD  cholecalciferol (VITAMIN D) 1000 UNITS tablet Take 1,000 Units by mouth daily.   Yes Historical Provider, MD  HYDROcodone-acetaminophen (LORTAB) 10-500 MG per tablet Take 1 tablet by mouth every 6 (six) hours as needed. For pain   Yes Historical Provider, MD  metoprolol tartrate (LOPRESSOR) 25 MG tablet Take 25 mg by mouth 2 (two) times daily.   Yes Historical Provider, MD  oxcarbazepine (TRILEPTAL) 600 MG tablet Take 600 mg by mouth 3 (three) times daily.   Yes Historical Provider, MD  simvastatin (ZOCOR) 40 MG tablet Take 40 mg by mouth every evening.   Yes Historical Provider, MD    Allergies: No Known Allergies  SH: Married, on disability for seizures Quit smoking 10 yrs ago 1 beer/night No illicits  History reviewed. No pertinent family history. Father died from mesothelioma, had CAD on autopsy M without CAD Siblings without CAD  Review of Systems: General: negative for chills, fever, night sweats or weight changes.  Cardiovascular: see HPI. No orthopnea, PND, LE swelling  Dermatological: negative for rash Respiratory: negative for cough or wheezing Urologic: negative for hematuria Abdominal: negative for nausea, vomiting, diarrhea, bright red blood per rectum, melena, or hematemesis Neurologic: negative for visual changes, syncope, or dizziness All other  systems reviewed and are otherwise negative except as noted above.  Labs:   Lab Results  Component Value Date   WBC 8.5 10/07/2011   HGB 15.6 10/07/2011   HCT 44.3 10/07/2011   MCV 85.9 10/07/2011   PLT 162 10/07/2011    Lab 10/07/11 1845  NA 134*  K 3.6  CL 100  CO2 24  BUN 10  CREATININE 0.92  CALCIUM 9.9  PROT  7.0  BILITOT 0.3  ALKPHOS 98  ALT 32  AST 21  GLUCOSE 117*   No results found for this basename: CKTOTAL:4,CKMB:4,TROPONINI:4 in the last 72 hours Lab Results  Component Value Date   CHOL  Value: 196        ATP III CLASSIFICATION:  <200     mg/dL   Desirable  161-096  mg/dL   Borderline High  >=045    mg/dL   High 05/17/8117   HDL 32* 09/19/2007   LDLCALC  Value: 133        Total Cholesterol/HDL:CHD Risk Coronary Heart Disease Risk Table                     Men   Women  1/2 Average Risk   3.4   3.3* 09/19/2007   TRIG 156* 09/19/2007   No results found for this basename: DDIMER    Radiology/Studies:  Dg Chest Port 1 View  10/07/2011  *RADIOLOGY REPORT*  Clinical Data: Pain.  PORTABLE CHEST - 1 VIEW  Comparison: Plain film chest 09/18/2007.  Findings: Lungs clear.  Heart size normal.  No pneumothorax or pleural effusion.  IMPRESSION: No acute disease.   Original Report Authenticated By: Bernadene Bell. D'ALESSIO, M.D.      EKG: sinus, LAD, PRWP with q in V6, no ST or TW changes, unchanged from 2009  Physical Exam: Blood pressure 106/68, pulse 58, temperature 98.8 F (37.1 C), temperature source Oral, resp. rate 13, SpO2 97.00%. General: Well developed, well nourished, in no acute distress. Sitting comfortably Head: Normocephalic, atraumatic, sclera non-icteric, nares are without discharge Neck: Supple. Negative for carotid bruits. JVD not elevated. Lungs: Clear bilaterally to auscultation without wheezes, rales, or rhonchi. Breathing is unlabored. Heart: RRR with S1 S2. No murmurs, rubs, or gallops appreciated. Abdomen: Soft, non-tender, non-distended with normoactive bowel sounds. No rebound/guarding. No obvious abdominal masses. Msk:  Strength and tone appear normal for age. Extremities: No edema. No clubbing or cyanosis. Distal pedal pulses are 2+ and equal bilaterally. Neuro: Alert and oriented X 3. Moves all extremities spontaneously. Psych:  Responds to questions appropriately with a  normal affect.   Problem List 1. L arm pain, r/o ACS 2. CAD s/p STEMI 3. HTN 4. Hyperlipidemia 5. H/o seizure disorder 6. H/o LBP  ASSESSMENT AND PLAN:  51 yo with h/o CAD s/p STEMI presenting with left arm pain, similar symptoms to his STEMI in 2009. Encouragingly, initial troponin and EKG do not demonstrate active ischemia. Currently chest pain free and back to baseline.  Will rule out active ischemia with serial ekgs and troponin. Continue ASA, statin (repeat lipid panel), beta blocker. Do not feel that anti-coagulation is currently indicated so will hold heparin/full lovenox for now but will start if recurrent symptoms, EKG changes, or + troponin.  Continue home pain medication for LBP and anti-seizure medication.  NPO at midnight for possible procedure in AM (pharmacologic stress MPI/echo or cath) depending on overnight events and labs.  [facility] to follow up in the AM.  Signed, Akacia Boltz C.  MD 10/07/2011, 9:37 PM

## 2011-10-08 LAB — CK TOTAL AND CKMB (NOT AT ARMC)
Relative Index: INVALID (ref 0.0–2.5)
Total CK: 73 U/L (ref 7–232)

## 2011-10-08 LAB — BASIC METABOLIC PANEL
BUN: 13 mg/dL (ref 6–23)
CO2: 20 mEq/L (ref 19–32)
Chloride: 102 mEq/L (ref 96–112)
Creatinine, Ser: 0.87 mg/dL (ref 0.50–1.35)

## 2011-10-08 LAB — LIPID PANEL
Cholesterol: 153 mg/dL (ref 0–200)
HDL: 41 mg/dL (ref >39)
Total CHOL/HDL Ratio: 3.7 RATIO

## 2011-10-08 LAB — TROPONIN I: Troponin I: <0.3 ng/mL (ref <0.30)

## 2011-10-08 MED ORDER — NITROGLYCERIN 0.4 MG SL SUBL: 0.4000 mg | SUBLINGUAL_TABLET | SUBLINGUAL | Status: DC | PRN

## 2011-10-08 MED ORDER — ACETAMINOPHEN 325 MG PO TABS: 650.0000 mg | ORAL_TABLET | ORAL | Status: DC | PRN

## 2011-10-08 MED ORDER — SODIUM CHLORIDE 0.9 % IJ SOLN: 3.0000 mL | INTRAMUSCULAR | Status: DC | PRN

## 2011-10-08 MED ORDER — VITAMIN D3 25 MCG (1000 UNIT) PO TABS
1000.0000 [IU] | ORAL_TABLET | Freq: Every day | ORAL | Status: DC
  Administered 2011-10-08: 1000 [IU] via ORAL
  Filled 2011-10-08: qty 1

## 2011-10-08 MED ORDER — SODIUM CHLORIDE 0.9 % IV SOLN: 250.0000 mL | INTRAVENOUS | Status: DC | PRN

## 2011-10-08 MED ORDER — SIMVASTATIN 40 MG PO TABS
40.0000 mg | ORAL_TABLET | Freq: Every evening | ORAL | Status: DC
  Filled 2011-10-08: qty 1

## 2011-10-08 MED ORDER — ASPIRIN 325 MG PO TABS
325.0000 mg | ORAL_TABLET | Freq: Every day | ORAL | Status: DC
  Administered 2011-10-08: 325 mg via ORAL
  Filled 2011-10-08: qty 1

## 2011-10-08 MED ORDER — ONDANSETRON HCL 4 MG/2ML IJ SOLN: 4.0000 mg | Freq: Four times a day (QID) | INTRAMUSCULAR | Status: DC | PRN

## 2011-10-08 MED ORDER — SODIUM CHLORIDE 0.9 % IJ SOLN
3.0000 mL | Freq: Two times a day (BID) | INTRAMUSCULAR | Status: DC
  Administered 2011-10-08: 3 mL via INTRAVENOUS

## 2011-10-08 MED ORDER — HYDROCODONE-ACETAMINOPHEN 10-325 MG PO TABS: 1.0000 | ORAL_TABLET | Freq: Four times a day (QID) | ORAL | Status: DC | PRN

## 2011-10-08 MED ORDER — OXCARBAZEPINE 300 MG PO TABS
600.0000 mg | ORAL_TABLET | Freq: Three times a day (TID) | ORAL | Status: DC
  Administered 2011-10-08: 600 mg via ORAL
  Filled 2011-10-08 (×3): qty 2

## 2011-10-08 MED ORDER — METOPROLOL TARTRATE 25 MG PO TABS
25.0000 mg | ORAL_TABLET | Freq: Two times a day (BID) | ORAL | Status: DC
  Filled 2011-10-08 (×3): qty 1

## 2011-10-08 MED ORDER — ENOXAPARIN SODIUM 40 MG/0.4ML ~~LOC~~ SOLN
40.0000 mg | SUBCUTANEOUS | Status: DC
  Administered 2011-10-08: 40 mg via SUBCUTANEOUS
  Filled 2011-10-08: qty 0.4

## 2011-10-08 NOTE — Progress Notes (Signed)
Pts assessment unchanged from this am. He is stable for d/c. Instructions reviewed with wife. D/c'd to private vehicle via wheelchair

## 2011-10-08 NOTE — Discharge Summary (Signed)
Patient ID: KIN GALBRAITH MRN: [id number] DOB/AGE: July 23, 1960 51 y.o.  Admit date: 10/07/2011 Discharge date: 10/08/2011  Primary Discharge Diagnosis CAD Secondary Discharge Diagnosis:chest tightness, prior MI  Significant Diagnostic Studies: none  Consults: None  Hospital Course: 51 y/o who has a history of MI and prior multivessel stents.  He had sonme arm pain and "tingling" in his body that prompted the visit to the ER.  The arm pain was similar to his prior angina.  THe tingling was different.  His sx resolved.  He thinks he may have had some improvement with NTG.  While in the hospital, he has not had any sx.  Prior to admission, he has been walking regularly and working hard in the yard without any issues.  He had a cath a few years ago at another hospital.  He was told that a small stent was occluded but he had developed collaterals.  The large vessels were patent and his sx have been managed well,.  He is willing to have a stress test but prefers to have it as an outpatient.  He wants to leave as soon as possible.  LDL 96.  He should have LDL closer to 70.  He has been following with a different cardiologist.  He will call our office if he has any further symptoms.  Discharge Exam: Blood pressure 114/73, pulse 53, temperature 97.6 F (36.4 C), temperature source Oral, resp. rate 17, SpO2 96.00%.   [place]/AT RRR, S1S2 CTA bilaterally Obese No edema No focal neurologic deficits  Labs:   Lab Results  Component Value Date   WBC 8.5 10/07/2011   HGB 15.6 10/07/2011   HCT 44.3 10/07/2011   MCV 85.9 10/07/2011   PLT 162 10/07/2011    Lab 10/08/11 0545 10/07/11 1845  NA 136 --  K 4.1 --  CL 102 --  CO2 20 --  BUN 13 --  CREATININE 0.87 --  CALCIUM 9.5 --  PROT -- 7.0  BILITOT -- 0.3  ALKPHOS -- 98  ALT -- 32  AST -- 21  GLUCOSE 95 --   Lab Results  Component Value Date   CKTOTAL 73 10/08/2011   CKMB 1.9 10/08/2011   TROPONINI <0.30 10/08/2011    Lab Results    Component Value Date   CHOL 153 10/08/2011   CHOL  Value: 196        ATP III CLASSIFICATION:  <200     mg/dL   Desirable  409-811  mg/dL   Borderline High  >=914    mg/dL   High 7/82/9562   Lab Results  Component Value Date   HDL 41 10/08/2011   HDL 32* 09/19/2007   Lab Results  Component Value Date   LDLCALC 96 10/08/2011   LDLCALC  Value: 133        Total Cholesterol/HDL:CHD Risk Coronary Heart Disease Risk Table                     Men   Women  1/2 Average Risk   3.4   3.3* 09/19/2007   Lab Results  Component Value Date   TRIG 79 10/08/2011   TRIG 156* 09/19/2007   Lab Results  Component Value Date   CHOLHDL 3.7 10/08/2011   CHOLHDL 6.1 09/19/2007   No results found for this basename: LDLDIRECT      Radiology:CXR shows no active disease EKG: NSR, no significant ST segment changes  FOLLOW UP PLANS AND APPOINTMENTS  Medication List  As  of 10/08/2011 11:08 AM   TAKE these medications         aspirin 325 MG tablet   Take 325 mg by mouth daily.      cholecalciferol 1000 UNITS tablet   Commonly known as: VITAMIN D   Take 1,000 Units by mouth daily.      HYDROcodone-acetaminophen 10-500 MG per tablet   Commonly known as: LORTAB   Take 1 tablet by mouth every 6 (six) hours as needed. For pain      metoprolol tartrate 25 MG tablet   Commonly known as: LOPRESSOR   Take 25 mg by mouth 2 (two) times daily.      nitroGLYCERIN 0.4 MG SL tablet   Commonly known as: NITROSTAT   Place 1 tablet (0.4 mg total) under the tongue every 5 (five) minutes as needed for chest pain (CP or SOB).      oxcarbazepine 600 MG tablet   Commonly known as: TRILEPTAL   Take 600 mg by mouth 3 (three) times daily.      simvastatin 40 MG tablet   Commonly known as: ZOCOR   Take 40 mg by mouth every evening.           Follow-up Information    Follow up with Corky Crafts., MD. (for stress test -9/3 8:30)    Contact information:   [address] [place]  [address] [phone]          BRING ALL MEDICATIONS WITH YOU TO FOLLOW UP APPOINTMENTS  Time spent with patient to include physician time:25 minutes Signed: Rosealynn Mateus S. 10/08/2011, 11:08 AM

## 2012-02-25 ENCOUNTER — Other Ambulatory Visit: Payer: Self-pay | Admitting: Neurosurgery

## 2012-02-25 DIAGNOSIS — M549 Dorsalgia, unspecified: Secondary | ICD-10-CM

## 2012-02-29 ENCOUNTER — Other Ambulatory Visit: Payer: Self-pay | Admitting: Neurosurgery

## 2012-02-29 ENCOUNTER — Ambulatory Visit
Admission: RE | Admit: 2012-02-29 | Discharge: 2012-02-29 | Disposition: A | Discharge status: Home / Self Care | Payer: Medicare Other | Source: Ambulatory Visit | Attending: Neurosurgery | Admitting: Neurosurgery

## 2012-02-29 DIAGNOSIS — M549 Dorsalgia, unspecified: Secondary | ICD-10-CM

## 2012-02-29 MED ORDER — IOHEXOL 180 MG/ML  SOLN
1.0000 mL | Freq: Once | INTRAMUSCULAR | Status: AC | PRN
  Administered 2012-02-29: 1 mL via EPIDURAL

## 2012-02-29 MED ORDER — METHYLPREDNISOLONE ACETATE 40 MG/ML INJ SUSP (RADIOLOG
120.0000 mg | Freq: Once | INTRAMUSCULAR | Status: AC
  Administered 2012-02-29: 120 mg via EPIDURAL

## 2012-03-16 ENCOUNTER — Other Ambulatory Visit: Payer: Self-pay | Admitting: Neurosurgery

## 2012-03-16 DIAGNOSIS — M549 Dorsalgia, unspecified: Secondary | ICD-10-CM

## 2012-03-24 ENCOUNTER — Other Ambulatory Visit: Payer: Medicare Other

## 2012-03-31 ENCOUNTER — Other Ambulatory Visit: Payer: Self-pay | Admitting: Neurosurgery

## 2012-03-31 ENCOUNTER — Ambulatory Visit
Admission: RE | Admit: 2012-03-31 | Discharge: 2012-03-31 | Disposition: A | Discharge status: Home / Self Care | Payer: Medicare Other | Source: Ambulatory Visit | Attending: Neurosurgery | Admitting: Neurosurgery

## 2012-03-31 DIAGNOSIS — M549 Dorsalgia, unspecified: Secondary | ICD-10-CM

## 2012-03-31 MED ORDER — METHYLPREDNISOLONE ACETATE 40 MG/ML INJ SUSP (RADIOLOG: 120.0000 mg | Freq: Once | INTRAMUSCULAR | Status: DC

## 2012-03-31 MED ORDER — IOHEXOL 180 MG/ML  SOLN: 1.0000 mL | Freq: Once | INTRAMUSCULAR | Status: AC | PRN

## 2012-04-10 ENCOUNTER — Other Ambulatory Visit: Payer: Self-pay | Admitting: Neurology

## 2012-04-10 DIAGNOSIS — M8580 Other specified disorders of bone density and structure, unspecified site: Secondary | ICD-10-CM

## 2012-04-21 ENCOUNTER — Ambulatory Visit
Admission: RE | Admit: 2012-04-21 | Discharge: 2012-04-21 | Disposition: A | Discharge status: Home / Self Care | Payer: Medicare Other | Source: Ambulatory Visit | Attending: Neurology | Admitting: Neurology

## 2012-04-21 DIAGNOSIS — M8580 Other specified disorders of bone density and structure, unspecified site: Secondary | ICD-10-CM

## 2012-10-06 ENCOUNTER — Ambulatory Visit (INDEPENDENT_AMBULATORY_CARE_PROVIDER_SITE_OTHER): Payer: Medicare Other | Admitting: Neurology

## 2012-10-06 ENCOUNTER — Encounter: Payer: Self-pay | Admitting: Neurology

## 2012-10-06 VITALS — BP 139/83 | HR 58 | Ht 71.0 in | Wt 253.0 lb

## 2012-10-06 DIAGNOSIS — Z9889 Other specified postprocedural states: Secondary | ICD-10-CM

## 2012-10-06 DIAGNOSIS — R569 Unspecified convulsions: Secondary | ICD-10-CM

## 2012-10-06 DIAGNOSIS — R413 Other amnesia: Secondary | ICD-10-CM | POA: Insufficient documentation

## 2012-10-06 NOTE — Progress Notes (Signed)
History of Present Illness:  Peter Barnett is a 52 years old right-handed Caucasian male, accompanied by his wife of 12 years, he was patient's of Dr. Sandria Manly, last clinical visit was in February 2014, he has been followed by our clinic for partial seizure,   He suffered complex partial seizure, in 1981, was diagnosed with right frontal AV malformation, had a right frontal craniotomy by Dr. Ronelle Nigh at [facility] in 1981, he seizure has been under good control, but he continued to have occasional staring spells, which was considered due to simple partial or complex partial seizure, over the years, he has tried different medications, Dilantin caused gingiva hypertrophy, Keppra cause mood disorder, Topamax complains of memory trouble,  He has 3 staring spell in 6 months per wife, no body shaking movement. This morning,  he woke up noticed his left leg was jumping, there was no post event confusion, this is the first time, he experienced similar complaints, reported previous abnormal EEG  He has been taking oxcarbazepine, currently taking 600 mg 3 times during the daytime, half tablets at nighttime, most recent laboratory evaluation was in April 10 2012, 23,  normal range was 10-35,  He could not have MRI of the brain because mental plate at right frontal craniotomy site, he was also seen by Dr. Marcelino Freestone for his chronic low back pain, left lumbar radiculopathy, he continued to complains of shooting pain from his left lower back to his left leg, worsening by prolonged walking, he had 2 lumbar surgery, most recent one was in 2010, this was evaluated by lumbar myelogram.  He is receiving some epidural injection which has been beneficial  He had 10 years of education, quit high school because he never was good at school, and did not like school, works at [employer] job all his life, went on disability since 1981 after his right frontal craniotomy, he stays at home, doing household chores, patient stated he  never had good memory all his life, wife noticed worsening memory over the past few years, especially over the past 1 year, he forgot why he went to kitchen, he could not get all the items on the list for grocery shopping, after shower, he could not remember whether he has shampooed his hair or not, but he is still driving, not getting lost, but need direction from his wife, wife continued to notice occasionally staring spells, about 3 spells in 6 months, he staring off into space, there was no body jerking movement. There was no family history of memory loss   Review of Systems  Out of a complete 14 system review, the patient complains of only the following symptoms, and all other reviewed systems are negative.  Cardiovascular: Chest pain   Neurological: Memory loss   Confusion   Numbness     Social History  He  does not work. He lives at home with his wife of  12  years, Peter Barnett. He has two children. Consumes 2 cups of coffee a day. He quit smoking in 2002.  Consumes 5 beer weekly, denies drug use. He is right handed.  Inhaled Tobacco Use: former smoker  Family History  Mother is living. Father died at 83 with mesothelioma.  Past Medical History  Positive for heart disease  with MI 09/18/2007 and PTCA x3., Chronic back pain with lumbar spinal stenosis at L4-5 status post surgery x2., and high cholesterol.  History of seizures with his last generalized major motor seizure in October 2006 which was nocturnal.  Surgical History    Right craniotomy for AVM in 1981 With multiple clips.Marland Kitchen PTCA and stenting 09/18/2007. Lumbar spine surgery x2, 11/11/2006 02/22/2007 and a heart cath in January 2011.  PHYSICAL EXAMINATOINS:  Generalized: In no acute distress  Neck: Supple, no carotid bruits   Cardiac: Regular rate rhythm  Pulmonary: Clear to auscultation bilaterally  Musculoskeletal: No deformity  Neurological examination  Mentation: Alert oriented to time, place, history taking, and causual  conversation, MMSE was 22/30, he is not oriented to day and office name, missed 1/3 recall, has difficulty spell WORLD backward, could not write a full sentence.  Cranial nerve II-XII: Pupils were equal round reactive to light extraocular movements were full, visual field were full on confrontational test. facial sensation and strength were normal. hearing was intact to finger rubbing bilaterally. Uvula tongue midline.  head turning and shoulder shrug and were normal and symmetric.Tongue protrusion into cheek strength was normal.  Motor: normal tone, bulk and strength.  Sensory: Intact to fine touch, pinprick, preserved vibratory sensation, and proprioception at toes.  Coordination: Normal finger to nose, heel-to-shin bilaterally there was no truncal ataxia  Gait: Rising up from seated position without assistance, normal stance, without trunk ataxia, moderate stride, good arm swing, smooth turning, mild difficulty with left tiptoe walking  Romberg signs: Negative  Deep tendon reflexes: Brachioradialis 2/2, biceps 2/2, triceps 2/2, patellar 2/2, Achilles 2/2, plantar responses were flexor bilaterally.  Assessment and plan:  52 Years old right-handed Caucasian male, with past medical history of right AVM, status post right frontal craniotomy history of simple vs. complex partial seizure, now with a worsening memory trouble, continued occasionally staring spells,   1. His worsening memory trouble could due to history of right frontal craniotomy, complex partial seizure. 2  Laboratory evaluation for treatment cause,  including thyroid function,.   3 EEG  4 return to clinic with Peter Barnett in 6-9 months.

## 2012-10-11 ENCOUNTER — Other Ambulatory Visit (INDEPENDENT_AMBULATORY_CARE_PROVIDER_SITE_OTHER): Payer: Self-pay

## 2012-10-11 ENCOUNTER — Ambulatory Visit (INDEPENDENT_AMBULATORY_CARE_PROVIDER_SITE_OTHER): Payer: Medicare Other | Admitting: Radiology

## 2012-10-11 DIAGNOSIS — Z9889 Other specified postprocedural states: Secondary | ICD-10-CM

## 2012-10-11 DIAGNOSIS — R413 Other amnesia: Secondary | ICD-10-CM

## 2012-10-11 DIAGNOSIS — R569 Unspecified convulsions: Secondary | ICD-10-CM

## 2012-10-11 DIAGNOSIS — Z0289 Encounter for other administrative examinations: Secondary | ICD-10-CM

## 2012-10-13 LAB — THYROID PANEL WITH TSH
T4, Total: 7.3 ug/dL (ref 4.5–12.0)
TSH: 1.27 u[IU]/mL (ref 0.450–4.500)

## 2012-10-13 LAB — CBC
HCT: 42 % (ref 37.5–51.0)
MCH: 29.6 pg (ref 26.6–33.0)
MCV: 86 fL (ref 79–97)
Platelets: 153 10*3/uL (ref 150–379)
RBC: 4.86 x10E6/uL (ref 4.14–5.80)
RDW: 14 % (ref 12.3–15.4)
WBC: 7.7 10*3/uL (ref 3.4–10.8)

## 2012-10-13 LAB — COMPREHENSIVE METABOLIC PANEL
ALT: 27 IU/L (ref 0–44)
AST: 13 IU/L (ref 0–40)
Albumin/Globulin Ratio: 2.4 (ref 1.1–2.5)
Alkaline Phosphatase: 95 IU/L (ref 39–117)
BUN/Creatinine Ratio: 14 (ref 9–20)
Calcium: 9.3 mg/dL (ref 8.7–10.2)
Creatinine, Ser: 0.72 mg/dL — ABNORMAL LOW (ref 0.76–1.27)
GFR calc non Af Amer: 107 mL/min/{1.73_m2} (ref >59)
Globulin, Total: 1.7 g/dL (ref 1.5–4.5)
Potassium: 4.4 mmol/L (ref 3.5–5.2)
Sodium: 133 mmol/L — ABNORMAL LOW (ref 134–144)

## 2012-10-13 LAB — RPR: RPR: NONREACTIVE

## 2012-10-13 LAB — VITAMIN B12: Vitamin B-12: 339 pg/mL (ref 211–946)

## 2012-10-16 NOTE — Progress Notes (Signed)
Quick Note:  I called pt with normal lab results. He asked about EEG results. (relayed not available as yet). ______

## 2012-10-20 NOTE — Procedures (Signed)
HISTORY: 52 year old male, with past medical history of right AVM, status post right frontal craniectomy with history of simple vs. complex partial seizure, now with worsening memory trouble, continue with occasionally staring spells,  TECHNIQUE:  16 channel EEG was performed based on standard 10-16 international system. One channel was dedicated to EKG, which has demonstrates sinus rhythm of 54 beats per minutes.  Upon awakening, the posterior background activity was in alpha range 8Hz , with amplitude of 25 microvoltage, reactive to eye opening and closure.  There was no evidence of epilepsy for discharge.  Photic stimulation was performed, which induced a symmetric photic driving.  Hyperventilation was not performed   No sleep was achieved.  CONCLUSION: This is a  normal awake EEG.  There is no electrodiagnostic evidence of epileptiform discharge

## 2012-11-06 ENCOUNTER — Telehealth: Payer: Self-pay | Admitting: Neurology

## 2012-11-07 NOTE — Telephone Encounter (Signed)
Patient calling for results of EEG.

## 2012-11-08 NOTE — Telephone Encounter (Signed)
Please call pt for normal EEG

## 2012-11-13 NOTE — Telephone Encounter (Signed)
Called patient and let him know is EEG was normal.

## 2012-12-29 ENCOUNTER — Telehealth: Payer: Self-pay

## 2012-12-29 MED ORDER — METOPROLOL TARTRATE 25 MG PO TABS: 25.0000 mg | ORAL_TABLET | Freq: Two times a day (BID) | ORAL | Status: DC

## 2012-12-29 NOTE — Telephone Encounter (Signed)
Refilled

## 2013-04-09 ENCOUNTER — Ambulatory Visit: Payer: Medicare Other | Admitting: Nurse Practitioner

## 2013-04-19 ENCOUNTER — Ambulatory Visit: Payer: Medicare Other | Admitting: Nurse Practitioner

## 2013-04-28 ENCOUNTER — Encounter (HOSPITAL_BASED_OUTPATIENT_CLINIC_OR_DEPARTMENT_OTHER): Payer: Self-pay | Admitting: Emergency Medicine

## 2013-04-28 ENCOUNTER — Emergency Department (HOSPITAL_BASED_OUTPATIENT_CLINIC_OR_DEPARTMENT_OTHER)
Admission: EM | Admit: 2013-04-28 | Discharge: 2013-04-29 | Disposition: A | Discharge status: Home / Self Care | Payer: Medicare Other | Attending: Emergency Medicine | Admitting: Emergency Medicine

## 2013-04-28 DIAGNOSIS — E7800 Pure hypercholesterolemia, unspecified: Secondary | ICD-10-CM | POA: Insufficient documentation

## 2013-04-28 DIAGNOSIS — Z7982 Long term (current) use of aspirin: Secondary | ICD-10-CM | POA: Insufficient documentation

## 2013-04-28 DIAGNOSIS — I10 Essential (primary) hypertension: Secondary | ICD-10-CM | POA: Insufficient documentation

## 2013-04-28 DIAGNOSIS — Z9861 Coronary angioplasty status: Secondary | ICD-10-CM | POA: Insufficient documentation

## 2013-04-28 DIAGNOSIS — Z8669 Personal history of other diseases of the nervous system and sense organs: Secondary | ICD-10-CM | POA: Insufficient documentation

## 2013-04-28 DIAGNOSIS — I252 Old myocardial infarction: Secondary | ICD-10-CM | POA: Insufficient documentation

## 2013-04-28 DIAGNOSIS — Z87891 Personal history of nicotine dependence: Secondary | ICD-10-CM | POA: Insufficient documentation

## 2013-04-28 DIAGNOSIS — Z8701 Personal history of pneumonia (recurrent): Secondary | ICD-10-CM | POA: Insufficient documentation

## 2013-04-28 DIAGNOSIS — Z79899 Other long term (current) drug therapy: Secondary | ICD-10-CM | POA: Insufficient documentation

## 2013-04-28 DIAGNOSIS — N390 Urinary tract infection, site not specified: Secondary | ICD-10-CM

## 2013-04-28 DIAGNOSIS — G8929 Other chronic pain: Secondary | ICD-10-CM | POA: Insufficient documentation

## 2013-04-28 LAB — CBC WITH DIFFERENTIAL/PLATELET
BASOS PCT: 0 % (ref 0–1)
Basophils Absolute: 0 10*3/uL (ref 0.0–0.1)
EOS ABS: 0 10*3/uL (ref 0.0–0.7)
EOS PCT: 0 % (ref 0–5)
HCT: 47.5 % (ref 39.0–52.0)
Hemoglobin: 16.3 g/dL (ref 13.0–17.0)
LYMPHS ABS: 1.1 10*3/uL (ref 0.7–4.0)
Lymphocytes Relative: 8 % — ABNORMAL LOW (ref 12–46)
MCH: 30.5 pg (ref 26.0–34.0)
MCHC: 34.3 g/dL (ref 30.0–36.0)
MCV: 88.8 fL (ref 78.0–100.0)
MONOS PCT: 11 % (ref 3–12)
Monocytes Absolute: 1.6 10*3/uL — ABNORMAL HIGH (ref 0.1–1.0)
NEUTROS PCT: 81 % — AB (ref 43–77)
Neutro Abs: 12.2 10*3/uL — ABNORMAL HIGH (ref 1.7–7.7)
PLATELETS: 174 10*3/uL (ref 150–400)
RBC: 5.35 MIL/uL (ref 4.22–5.81)
RDW: 13.5 % (ref 11.5–15.5)
WBC: 15 10*3/uL — ABNORMAL HIGH (ref 4.0–10.5)

## 2013-04-28 LAB — URINALYSIS, ROUTINE W REFLEX MICROSCOPIC
GLUCOSE, UA: NEGATIVE mg/dL
KETONES UR: 40 mg/dL — AB
Nitrite: POSITIVE — AB
PROTEIN: 100 mg/dL — AB
Specific Gravity, Urine: 1.019 (ref 1.005–1.030)
UROBILINOGEN UA: 1 mg/dL (ref 0.0–1.0)
pH: 7 (ref 5.0–8.0)

## 2013-04-28 LAB — BASIC METABOLIC PANEL
BUN: 14 mg/dL (ref 6–23)
CALCIUM: 9.8 mg/dL (ref 8.4–10.5)
CO2: 24 meq/L (ref 19–32)
Chloride: 101 mEq/L (ref 96–112)
Creatinine, Ser: 0.9 mg/dL (ref 0.50–1.35)
GFR calc Af Amer: >90 mL/min (ref >90)
GFR calc non Af Amer: >90 mL/min (ref >90)
GLUCOSE: 92 mg/dL (ref 70–99)
Potassium: 4.2 mEq/L (ref 3.7–5.3)
SODIUM: 140 meq/L (ref 137–147)

## 2013-04-28 LAB — URINE MICROSCOPIC-ADD ON

## 2013-04-28 MED ORDER — CEPHALEXIN 500 MG PO CAPS: 500.0000 mg | ORAL_CAPSULE | Freq: Four times a day (QID) | ORAL | Status: DC

## 2013-04-28 MED ORDER — DEXTROSE 5 % IV SOLN
1.0000 g | Freq: Once | INTRAVENOUS | Status: AC
  Administered 2013-04-28: 1 g via INTRAVENOUS

## 2013-04-28 MED ORDER — CEFTRIAXONE SODIUM 1 G IJ SOLR
INTRAMUSCULAR | Status: AC
  Filled 2013-04-28: qty 10

## 2013-04-28 MED ORDER — SODIUM CHLORIDE 0.9 % IV BOLUS (SEPSIS)
500.0000 mL | Freq: Once | INTRAVENOUS | Status: AC
  Administered 2013-04-28: 500 mL via INTRAVENOUS

## 2013-04-28 NOTE — Discharge Instructions (Signed)
Return if you are worse especially increased pain, inability to keep down antibiotics, or uncontrolled fever. Otherwise followup with Dr. Karsten Ro as scheduled.    Urinary Tract Infection Urinary tract infections (UTIs) can develop anywhere along your urinary tract. Your urinary tract is your body's drainage system for removing wastes and extra water. Your urinary tract includes two kidneys, two ureters, a bladder, and a urethra. Your kidneys are a pair of bean-shaped organs. Each kidney is about the size of your fist. They are located below your ribs, one on each side of your spine. CAUSES Infections are caused by microbes, which are microscopic organisms, including fungi, viruses, and bacteria. These organisms are so small that they can only be seen through a microscope. Bacteria are the microbes that most commonly cause UTIs. SYMPTOMS  Symptoms of UTIs may vary by age and gender of the patient and by the location of the infection. Symptoms in young women typically include a frequent and intense urge to urinate and a painful, burning feeling in the bladder or urethra during urination. Older women and men are more likely to be tired, shaky, and weak and have muscle aches and abdominal pain. A fever may mean the infection is in your kidneys. Other symptoms of a kidney infection include pain in your back or sides below the ribs, nausea, and vomiting. DIAGNOSIS To diagnose a UTI, your caregiver will ask you about your symptoms. Your caregiver also will ask to provide a urine sample. The urine sample will be tested for bacteria and white blood cells. White blood cells are made by your body to help fight infection. TREATMENT  Typically, UTIs can be treated with medication. Because most UTIs are caused by a bacterial infection, they usually can be treated with the use of antibiotics. The choice of antibiotic and length of treatment depend on your symptoms and the type of bacteria causing your infection. HOME  CARE INSTRUCTIONS  If you were prescribed antibiotics, take them exactly as your caregiver instructs you. Finish the medication even if you feel better after you have only taken some of the medication.  Drink enough water and fluids to keep your urine clear or pale yellow.  Avoid caffeine, tea, and carbonated beverages. They tend to irritate your bladder.  Empty your bladder often. Avoid holding urine for long periods of time.  Empty your bladder before and after sexual intercourse.  After a bowel movement, women should cleanse from front to back. Use each tissue only once. SEEK MEDICAL CARE IF:   You have back pain.  You develop a fever.  Your symptoms do not begin to resolve within 3 days. SEEK IMMEDIATE MEDICAL CARE IF:   You have severe back pain or lower abdominal pain.  You develop chills.  You have nausea or vomiting.  You have continued burning or discomfort with urination. MAKE SURE YOU:   Understand these instructions.  Will watch your condition.  Will get help right away if you are not doing well or get worse. Document Released: 11/04/2004 Document Revised: 07/27/2011 Document Reviewed: 03/05/2011 [facility] Patient Information 2014 [place].

## 2013-04-28 NOTE — ED Notes (Signed)
Pt reports freq urination and pain that has progress to blood in urine hx urethreal stricture

## 2013-04-28 NOTE — ED Provider Notes (Signed)
CSN: [id number]     Arrival date & time 04/28/13  2003 History   This chart was scribed for Shaune Pollack, MD by Maree Erie, ED Scribe. The patient was seen in room MH05/MH05. Patient's care was started at 9:22 PM.      Chief Complaint  Patient presents with  . Dysuria      Patient is a 53 y.o. male presenting with hematuria. The history is provided by the patient. No language interpreter was used.  Hematuria This is a new problem. The current episode started 2 days ago. The problem occurs constantly. The problem has been gradually worsening. Nothing relieves the symptoms.    HPI Comments: Peter Barnett is a 53 y.o. male who presents to the Emergency Department complaining of worsening, constant hematuria that began two days ago. He describes the blood as dark as well as some episodes of bright red blood. He reports associated suprapubic abdominal pain and difficulty initiating urination. He was seen by Dr. Karsten Ro yesterday who checked the ureter and stated that he had some scar tissue that should have been cleared up by the light but did not need a stricture. He has had his ureter stretched in the past when he has had similar symptoms. The hematuria worsened today and he was told by his Urologist to come to the ED or UC. He denies being put on antibiotics. He takes aspirin daily but denies taking any other anticoagulants. He denies testicular pain. He has a past medical history of HTN, hypercholesteremia, Mi, brain tumor and petit-mal epilepsy.   Past Medical History  Diagnosis Date  . HTN (hypertension)   . High cholesterol   . MI (myocardial infarction) 2009    "just had arm pain; never any chest pain"  . Exertional dyspnea 10/07/2011  . 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 Headache(784.0) 10/07/2011    "3-4 times/month"  . Pneumonia ~ 2010    "2 years in a row; May both times"  . Seizures 1981- present    petit mal  . Petit-mal epilepsy    . Chronic lower back pain    Past Surgical History  Procedure Laterality Date  . Brain surgery    . Craniotomy for avm  1981  . Tonsillectomy      "I was little"  . Lumbar disc surgery  2008; 2009  . Coronary angioplasty with stent placement  2009    "3"   History reviewed. No pertinent family history. History  Substance Use Topics  . Smoking status: Former Smoker -- 3.00 packs/day for 27 years    Types: Cigarettes    Quit date: 02/08/2001  . Smokeless tobacco: Never Used  . Alcohol Use: 0.6 oz/week    1 Cans of beer per week    Review of Systems  Constitutional: Positive for fever.  Genitourinary: Positive for dysuria, hematuria and difficulty urinating.  All other systems reviewed and are negative.      Allergies  Review of patient's allergies indicates no known allergies.  Home Medications   Current Outpatient Rx  Name  Route  Sig  Dispense  Refill  . aspirin 325 MG tablet   Oral   Take 325 mg by mouth daily.         . cholecalciferol (VITAMIN D) 1000 UNITS tablet   Oral   Take 1,000 Units by mouth daily.         Marland Kitchen HYDROcodone-acetaminophen (LORTAB) 10-500 MG  per tablet   Oral   Take 1 tablet by mouth every 6 (six) hours as needed. For pain         . metoprolol tartrate (LOPRESSOR) 25 MG tablet   Oral   Take 1 tablet (25 mg total) by mouth 2 (two) times daily.   60 tablet   11   . EXPIRED: nitroGLYCERIN (NITROSTAT) 0.4 MG SL tablet   Sublingual   Place 1 tablet (0.4 mg total) under the tongue every 5 (five) minutes as needed for chest pain (CP or SOB).   25 tablet   6   . oxcarbazepine (TRILEPTAL) 600 MG tablet   Oral   Take 600 mg by mouth 3 (three) times daily.         . simvastatin (ZOCOR) 40 MG tablet   Oral   Take 40 mg by mouth every evening.          Triage Vitals: BP 144/79  Pulse 96  Temp(Src) 100.5 F (38.1 C) (Oral)  Resp 18  Wt 250 lb (113.399 kg)  SpO2 100%  Physical Exam  Nursing note and vitals  reviewed. Constitutional: He is oriented to person, place, and time. He appears well-developed and well-nourished.  HENT:  Head: Normocephalic and atraumatic.  Eyes: Conjunctivae and EOM are normal. Pupils are equal, round, and reactive to light.  Neck: Normal range of motion. Neck supple.  Cardiovascular: Normal rate, regular rhythm and normal heart sounds.   Pulmonary/Chest: Effort normal and breath sounds normal. No respiratory distress. He has no wheezes. He has no rales. He exhibits no tenderness.  Abdominal: Soft. Bowel sounds are normal. There is no tenderness. There is no rebound and no guarding.  Musculoskeletal: Normal range of motion. He exhibits no edema.  Lymphadenopathy:    He has no cervical adenopathy.  Neurological: He is alert and oriented to person, place, and time.  Skin: Skin is warm and dry. No rash noted.  Psychiatric: He has a normal mood and affect.    ED Course  Procedures (including critical care time)  DIAGNOSTIC STUDIES: Oxygen Saturation is 100% on room air, normal by my interpretation.    COORDINATION OF CARE: 9:30 PM -Will order IV fluids, blood culture, CBC, BMP, and Urinalysis with urine microscopic-add on. Patient verbalizes understanding and agrees with treatment plan.    Labs Review Labs Reviewed  URINALYSIS, ROUTINE W REFLEX MICROSCOPIC - Abnormal; Notable for the following:    Color, Urine RED (*)    APPearance TURBID (*)    Hgb urine dipstick LARGE (*)    Bilirubin Urine LARGE (*)    Ketones, ur 40 (*)    Protein, ur 100 (*)    Nitrite POSITIVE (*)    Leukocytes, UA LARGE (*)    All other components within normal limits  CBC WITH DIFFERENTIAL - Abnormal; Notable for the following:    WBC 15.0 (*)    Neutrophils Relative % 81 (*)    Neutro Abs 12.2 (*)    Lymphocytes Relative 8 (*)    Monocytes Absolute 1.6 (*)    All other components within normal limits  URINE MICROSCOPIC-ADD ON - Abnormal; Notable for the following:     Bacteria, UA FIELD OBSCURED BY RBC'S (*)    All other components within normal limits  CULTURE, BLOOD (ROUTINE X 2)  CULTURE, BLOOD (ROUTINE X 2)  BASIC METABOLIC PANEL   Imaging Review No results found.   EKG Interpretation None      MDM  Final diagnoses:  None  53 y.o. Male with uti symptoms and recent instrumentation by urology.  Now with fever and pain with urination.  Urinalysis c.w. uti with tntc rbc and 21-50 wbc.  Patient given iv hydration and rocephin.  Patient feels improved.  Taking po, first dose of antibiotics in and urine being cultured.  Given recent instrumentation, I will discuss with urology.  Discussed with Dr. Joesphine Bare and plan op tx with keflex.  Patient to f/u with urology and patient advised regarding return precautions.  Patient and wife voice understanding.   I personally performed the services described in this documentation, which was scribed in my presence. The recorded information has been reviewed and considered.   Shaune Pollack, MD 04/29/13 [id number]

## 2013-04-29 NOTE — ED Notes (Signed)
rx x 1 given for keflex- d/c home with ride

## 2013-05-01 ENCOUNTER — Telehealth (HOSPITAL_BASED_OUTPATIENT_CLINIC_OR_DEPARTMENT_OTHER): Payer: Self-pay | Admitting: Emergency Medicine

## 2013-05-02 LAB — URINE CULTURE: SPECIAL REQUESTS: NORMAL

## 2013-05-05 LAB — CULTURE, BLOOD (ROUTINE X 2)
Culture: NO GROWTH
Culture: NO GROWTH

## 2013-05-12 ENCOUNTER — Ambulatory Visit (INDEPENDENT_AMBULATORY_CARE_PROVIDER_SITE_OTHER): Payer: Medicare Other | Admitting: Internal Medicine

## 2013-05-12 VITALS — BP 136/78 | HR 56 | Temp 97.6°F | Resp 16 | Ht 73.0 in | Wt 251.0 lb

## 2013-05-12 DIAGNOSIS — R319 Hematuria, unspecified: Secondary | ICD-10-CM

## 2013-05-12 DIAGNOSIS — R109 Unspecified abdominal pain: Secondary | ICD-10-CM

## 2013-05-12 DIAGNOSIS — N390 Urinary tract infection, site not specified: Secondary | ICD-10-CM

## 2013-05-12 LAB — POCT CBC
GRANULOCYTE PERCENT: 76.8 % (ref 37–80)
HCT, POC: 43.8 % (ref 43.5–53.7)
Hemoglobin: 14.2 g/dL (ref 14.1–18.1)
Lymph, poc: 2 (ref 0.6–3.4)
MCH: 29.6 pg (ref 27–31.2)
MCHC: 32.4 g/dL (ref 31.8–35.4)
MCV: 91.4 fL (ref 80–97)
MID (cbc): 0.8 (ref 0–0.9)
MPV: 7 fL (ref 0–99.8)
PLATELET COUNT, POC: 313 10*3/uL (ref 142–424)
POC Granulocyte: 9.1 — AB (ref 2–6.9)
POC LYMPH %: 16.6 % (ref 10–50)
POC MID %: 6.6 % (ref 0–12)
RBC: 4.79 M/uL (ref 4.69–6.13)
RDW, POC: 13.4 %
WBC: 11.9 10*3/uL — AB (ref 4.6–10.2)

## 2013-05-12 LAB — POCT URINALYSIS DIPSTICK
BILIRUBIN UA: NEGATIVE
Glucose, UA: NEGATIVE
Ketones, UA: NEGATIVE
Nitrite, UA: POSITIVE
Protein, UA: 100
SPEC GRAV UA: 1.02
Urobilinogen, UA: 0.2
pH, UA: 6.5

## 2013-05-12 LAB — POCT UA - MICROSCOPIC ONLY
Casts, Ur, LPF, POC: NEGATIVE
Crystals, Ur, HPF, POC: NEGATIVE
Mucus, UA: NEGATIVE
Yeast, UA: NEGATIVE

## 2013-05-12 MED ORDER — HYDROCODONE-ACETAMINOPHEN 5-325 MG PO TABS: 1.0000 | ORAL_TABLET | Freq: Four times a day (QID) | ORAL | Status: DC | PRN

## 2013-05-12 MED ORDER — CEFTRIAXONE SODIUM 1 G IJ SOLR
1.0000 g | Freq: Once | INTRAMUSCULAR | Status: AC
  Administered 2013-05-12: 1 g via INTRAMUSCULAR

## 2013-05-12 MED ORDER — CIPROFLOXACIN HCL 500 MG PO TABS: 500.0000 mg | ORAL_TABLET | Freq: Two times a day (BID) | ORAL | Status: DC

## 2013-05-12 NOTE — Patient Instructions (Signed)
Urinary Tract Infection  Urinary tract infections (UTIs) can develop anywhere along your urinary tract. Your urinary tract is your body's drainage system for removing wastes and extra water. Your urinary tract includes two kidneys, two ureters, a bladder, and a urethra. Your kidneys are a pair of bean-shaped organs. Each kidney is about the size of your fist. They are located below your ribs, one on each side of your spine.  CAUSES  Infections are caused by microbes, which are microscopic organisms, including fungi, viruses, and bacteria. These organisms are so small that they can only be seen through a microscope. Bacteria are the microbes that most commonly cause UTIs.  SYMPTOMS   Symptoms of UTIs may vary by age and gender of the patient and by the location of the infection. Symptoms in young women typically include a frequent and intense urge to urinate and a painful, burning feeling in the bladder or urethra during urination. Older women and men are more likely to be tired, shaky, and weak and have muscle aches and abdominal pain. A fever may mean the infection is in your kidneys. Other symptoms of a kidney infection include pain in your back or sides below the ribs, nausea, and vomiting.  DIAGNOSIS  To diagnose a UTI, your caregiver will ask you about your symptoms. Your caregiver also will ask to provide a urine sample. The urine sample will be tested for bacteria and white blood cells. White blood cells are made by your body to help fight infection.  TREATMENT   Typically, UTIs can be treated with medication. Because most UTIs are caused by a bacterial infection, they usually can be treated with the use of antibiotics. The choice of antibiotic and length of treatment depend on your symptoms and the type of bacteria causing your infection.  HOME CARE INSTRUCTIONS   If you were prescribed antibiotics, take them exactly as your caregiver instructs you. Finish the medication even if you feel better after you  have only taken some of the medication.   Drink enough water and fluids to keep your urine clear or pale yellow.   Avoid caffeine, tea, and carbonated beverages. They tend to irritate your bladder.   Empty your bladder often. Avoid holding urine for long periods of time.   Empty your bladder before and after sexual intercourse.   After a bowel movement, women should cleanse from front to back. Use each tissue only once.  SEEK MEDICAL CARE IF:    You have back pain.   You develop a fever.   Your symptoms do not begin to resolve within 3 days.  SEEK IMMEDIATE MEDICAL CARE IF:    You have severe back pain or lower abdominal pain.   You develop chills.   You have nausea or vomiting.   You have continued burning or discomfort with urination.  MAKE SURE YOU:    Understand these instructions.   Will watch your condition.   Will get help right away if you are not doing well or get worse.  Document Released: 11/04/2004 Document Revised: 07/27/2011 Document Reviewed: 03/05/2011  [employer] Patient Information 2014 [employer].

## 2013-05-12 NOTE — Progress Notes (Signed)
Subjective:    Patient ID: Peter Barnett, male    DOB: [birth year], 53 y.o.   MRN: [id number]  HPI Patient presents with bi-lateral flank pain since Thursday night.  Patient complains of kidney pain, dark urine, pain and burning with urination.  Patient passed some kidney stones on his own approximately 14 years ago.  Patient was seen by urologist 2 weeks ago.  Urologist said he had urethral stricture.  The next day he went to ER with fever, pain, and bloody urine.  ER gave patient  IV rocephin and patient was sent home with oral antibiotic.  Patient still seeing blood with urine and having pain. Aches in back and over bladder and has painful urination. Has hx of urethral stricture. Last culture grew E.Coli, sensitive to all Review of Systems     Objective:   Physical Exam  Constitutional: He is oriented to person, place, and time. He appears well-developed and well-nourished. No distress.  HENT:  Head: Normocephalic.  Right Ear: External ear normal.  Left Ear: External ear normal.  Eyes: EOM are normal. No scleral icterus.  Neck: Normal range of motion.  Cardiovascular: Normal rate, regular rhythm and normal heart sounds.   Pulmonary/Chest: Effort normal and breath sounds normal.  Abdominal: Soft. Bowel sounds are normal.  Neurological: He is alert and oriented to person, place, and time. No cranial nerve deficit. He exhibits normal muscle tone. Coordination normal.  Psychiatric: He has a normal mood and affect.   Results for orders placed in visit on 05/12/13  POCT URINALYSIS DIPSTICK      Result Value Ref Range   Color, UA yellow     Clarity, UA cloudy     Glucose, UA neg     Bilirubin, UA neg     Ketones, UA neg     Spec Grav, UA 1.020     Blood, UA large     pH, UA 6.5     Protein, UA 100     Urobilinogen, UA 0.2     Nitrite, UA positive     Leukocytes, UA moderate (2+)    POCT UA - MICROSCOPIC ONLY      Result Value Ref Range   WBC, Ur, HPF, POC TNTC     RBC,  urine, microscopic 20-25     Bacteria, U Microscopic 1+     Mucus, UA neg     Epithelial cells, urine per micros 2-4     Crystals, Ur, HPF, POC neg     Casts, Ur, LPF, POC neg     Yeast, UA neg    POCT CBC      Result Value Ref Range   WBC 11.9 (*) 4.6 - 10.2 K/uL   Lymph, poc 2.0  0.6 - 3.4   POC LYMPH PERCENT 16.6  10 - 50 %L   MID (cbc) 0.8  0 - 0.9   POC MID % 6.6  0 - 12 %M   POC Granulocyte 9.1 (*) 2 - 6.9   Granulocyte percent 76.8  37 - 80 %G   RBC 4.79  4.69 - 6.13 M/uL   Hemoglobin 14.2  14.1 - 18.1 g/dL   HCT, POC 43.8  43.5 - 53.7 %   MCV 91.4  80 - 97 fL   MCH, POC 29.6  27 - 31.2 pg   MCHC 32.4  31.8 - 35.4 g/dL   RDW, POC 13.4     Platelet Count, POC 313  142 - 424 K/uL  MPV 7.0  0 - 99.8 fL   CS urine       Assessment & Plan:  UTI/Early Pyelonephritis Rocephin/Cipro/HC See urology Monday

## 2013-05-15 LAB — URINE CULTURE

## 2013-06-01 ENCOUNTER — Telehealth: Payer: Self-pay | Admitting: *Deleted

## 2013-06-01 MED ORDER — OXCARBAZEPINE 600 MG PO TABS: 600.0000 mg | ORAL_TABLET | Freq: Three times a day (TID) | ORAL | Status: DC

## 2013-06-01 NOTE — Telephone Encounter (Signed)
I called the patient back.  He said he was a Dr Erling Cruz patient and the pharmacy was trying to submit it under Dr Tressia Danas name, so it did not go through the system electronically.  He says he does not use [facility], he uses [facility].  I verified drug and dose with the patient.  Rx has been sent.  Patient is aware.

## 2013-06-01 NOTE — Telephone Encounter (Signed)
Patient at [facility] still waiting on RX for seizure medication.  Please call pt and advise..thanks

## 2013-08-09 ENCOUNTER — Encounter: Payer: Self-pay | Admitting: Nurse Practitioner

## 2013-08-09 ENCOUNTER — Ambulatory Visit (INDEPENDENT_AMBULATORY_CARE_PROVIDER_SITE_OTHER): Payer: Medicare Other | Admitting: Nurse Practitioner

## 2013-08-09 VITALS — BP 124/78 | HR 49 | Ht 71.0 in | Wt 254.6 lb

## 2013-08-09 DIAGNOSIS — R569 Unspecified convulsions: Secondary | ICD-10-CM

## 2013-08-09 DIAGNOSIS — Z9889 Other specified postprocedural states: Secondary | ICD-10-CM

## 2013-08-09 DIAGNOSIS — Z5181 Encounter for therapeutic drug level monitoring: Secondary | ICD-10-CM

## 2013-08-09 DIAGNOSIS — R413 Other amnesia: Secondary | ICD-10-CM

## 2013-08-09 LAB — COMPREHENSIVE METABOLIC PANEL
A/G RATIO: 1.7 (ref 1.1–2.5)
ALT: 32 IU/L (ref 0–44)
AST: 26 IU/L (ref 0–40)
Albumin: 4.1 g/dL (ref 3.5–5.5)
Alkaline Phosphatase: 100 IU/L (ref 39–117)
BUN/Creatinine Ratio: 14 (ref 9–20)
BUN: 10 mg/dL (ref 6–24)
CHLORIDE: 103 mmol/L (ref 96–108)
CO2: 21 mmol/L (ref 18–29)
Calcium: 9.5 mg/dL (ref 8.7–10.2)
Creatinine, Ser: 0.74 mg/dL — ABNORMAL LOW (ref 0.76–1.27)
GFR calc Af Amer: 121 mL/min/{1.73_m2} (ref >59)
GFR, EST NON AFRICAN AMERICAN: 105 mL/min/{1.73_m2} (ref >59)
GLUCOSE: 112 mg/dL — AB (ref 65–99)
Globulin, Total: 2.4 g/dL (ref 1.5–4.5)
POTASSIUM: 4.9 mmol/L (ref 3.5–5.2)
SODIUM: 134 mmol/L (ref 134–144)
TOTAL PROTEIN: 6.5 g/dL (ref 6.0–8.5)
Total Bilirubin: 0.3 mg/dL (ref 0.0–1.2)

## 2013-08-09 LAB — CBC WITH DIFFERENTIAL/PLATELET
BASOS ABS: 0 10*3/uL (ref 0.0–0.2)
Basos: 0 %
EOS ABS: 0.2 10*3/uL (ref 0.0–0.4)
Eos: 2 %
HEMATOCRIT: 43.1 % (ref 37.5–51.0)
Hemoglobin: 15.3 g/dL (ref 12.6–17.7)
LYMPHS ABS: 2 10*3/uL (ref 0.7–3.1)
Lymphs: 25 %
MCH: 30.6 pg (ref 26.6–33.0)
MCHC: 35.5 g/dL (ref 31.5–35.7)
MCV: 86 fL (ref 79–97)
Monocytes Absolute: 0.8 10*3/uL (ref 0.1–0.9)
Monocytes: 9 %
NEUTROS ABS: 5.1 10*3/uL (ref 1.4–7.0)
Neutrophils Relative %: 64 %
RBC: 5 x10E6/uL (ref 4.14–5.80)
RDW: 14.8 % (ref 12.3–15.4)
WBC: 8 10*3/uL (ref 3.4–10.8)

## 2013-08-09 MED ORDER — OXCARBAZEPINE 600 MG PO TABS: 600.0000 mg | ORAL_TABLET | Freq: Three times a day (TID) | ORAL | Status: DC

## 2013-08-09 NOTE — Patient Instructions (Signed)
Continue Trileptal at current dose will refill Check labs today Followup yearly and when necessary

## 2013-08-09 NOTE — Progress Notes (Signed)
[facility]  PATIENT: Peter Barnett DOB: 03/18/1960   REASON FOR VISIT:follow up for seizure disorder, memory loss,    HISTORY OF PRESENT ILLNESS:Peter Barnett is a 53 years old right-handed Caucasian male, accompanied by his wife of 13 years, he was patient's of Dr. Erling Cruz, last clinical visit was in February 2014, he has been followed by our clinic for partial seizure,  He suffered complex partial seizure, in 1981, was diagnosed with right frontal AV malformation, had a right frontal craniotomy by Dr. Lesly Dukes at [facility] in 1981, he seizure has been under good control, but he continued to have occasional staring spells, which was considered due to simple partial or complex partial seizure, over the years, he has tried different medications, Dilantin caused gingiva hypertrophy, Keppra cause mood disorder, Topamax complains of memory trouble,  He has 3 staring spell in 6 months per wife, no body shaking movement. This morning, he woke up noticed his left leg was jumping, there was no post event confusion, this is the first time, he experienced similar complaints, reported previous abnormal EEG  He has been taking oxcarbazepine, currently taking 600 mg 3 times during the daytime, half tablets at nighttime, most recent laboratory evaluation was in April 10 2012, 23, normal range was 10-35,  He could not have MRI of the brain because mental plate at right frontal craniotomy site, he was also seen by Dr. Joya Salm for his chronic low back pain, left lumbar radiculopathy, he continued to complains of shooting pain from his left lower back to his left leg, worsening by prolonged walking, he had 2 lumbar surgery, most recent one was in 2010, this was evaluated by lumbar myelogram. He is receiving some epidural injection which has been beneficial  He had 10 years of education, quit high school because he never was good at school, and did not like school, works at [employer] job all  his life, went on disability since 1981 after his right frontal craniotomy, he stays at home, doing household chores, patient stated he never had good memory all his life, wife noticed worsening memory over the past few years, especially over the past 1 year, he forgot why he went to kitchen, he could not get all the items on the list for grocery shopping, after shower, he could not remember whether he has shampooed his hair or not, but he is still driving, not getting lost, but need direction from his wife, wife continued to notice occasionally staring spells, about 3 spells in 6 months, he staring off into space, there was no body jerking movement. There was no family history of memory loss  UPDATE: 08/09/2013 patient returns for followup. He has not had any recent seizure activity. EEG done last year was read as normal. He denies any side effects of Trileptal. 2 UTIs in the last 6 months. Labs to rule out organic causes of memory loss were negative. He continues to complain with memory loss however his memory score is stable. Has history of craniotomy in 1981. He has been on disability for many years. He returns for reevaluation  REVIEW OF SYSTEMS: Full 14 system review of systems performed and notable only for those listed, all others are neg:  Constitutional: N/A  Cardiovascular: N/A  Ear/Nose/Throat: N/A  Skin: N/A  Eyes: N/A  Respiratory: N/A  Gastroitestinal: N/A  Hematology/Lymphatic: N/A  Endocrine: N/A Musculoskeletal:N/A  Allergy/Immunology: N/A  Neurological: Memory loss Psychiatric: N/A Sleep : NA   ALLERGIES: No Known  Allergies  HOME MEDICATIONS: Outpatient Prescriptions Prior to Visit  Medication Sig Dispense Refill  . aspirin 325 MG tablet Take 325 mg by mouth daily.      . cholecalciferol (VITAMIN D) 1000 UNITS tablet Take 1,000 Units by mouth daily.      Marland Kitchen HYDROcodone-acetaminophen (LORTAB) 10-500 MG per tablet Take 1 tablet by mouth every 6 (six) hours as needed. For  pain      . metoprolol tartrate (LOPRESSOR) 25 MG tablet Take 1 tablet (25 mg total) by mouth 2 (two) times daily.  60 tablet  11  . oxcarbazepine (TRILEPTAL) 600 MG tablet Take 1 tablet (600 mg total) by mouth 3 (three) times daily. And one half at bedtime  105 tablet  3  . simvastatin (ZOCOR) 40 MG tablet Take 40 mg by mouth every evening.      . nitroGLYCERIN (NITROSTAT) 0.4 MG SL tablet Place 1 tablet (0.4 mg total) under the tongue every 5 (five) minutes as needed for chest pain (CP or SOB).  25 tablet  6  . cephALEXin (KEFLEX) 500 MG capsule Take 1 capsule (500 mg total) by mouth 4 (four) times daily.  20 capsule  0  . ciprofloxacin (CIPRO) 500 MG tablet Take 1 tablet (500 mg total) by mouth 2 (two) times daily.  20 tablet  0  . HYDROcodone-acetaminophen (NORCO/VICODIN) 5-325 MG per tablet Take 1 tablet by mouth every 6 (six) hours as needed.  30 tablet  0   No facility-administered medications prior to visit.    PAST MEDICAL HISTORY: Past Medical History  Diagnosis Date  . HTN (hypertension)   . High cholesterol   . MI (myocardial infarction) 2009    "just had arm pain; never any chest pain"  . Exertional dyspnea 10/07/2011  . 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 Headache(784.0) 10/07/2011    "3-4 times/month"  . Pneumonia ~ 2010    "2 years in a row; May both times"  . Seizures 1981- present    petit mal  . Petit-mal epilepsy   . Chronic lower back pain     PAST SURGICAL HISTORY: Past Surgical History  Procedure Laterality Date  . Brain surgery    . Craniotomy for avm  1981  . Tonsillectomy      "I was little"  . Lumbar disc surgery  2008; 2009  . Coronary angioplasty with stent placement  2009    "3"    FAMILY HISTORY: Family History  Problem Relation Age of Onset  . Hyperlipidemia Mother   . Cancer Father     SOCIAL HISTORY: History   Social History  . Marital Status: Married    Spouse Name: [name]    Number of  Children: 2  . Years of Education: 10 th   Occupational History  .      retired   Social History Main Topics  . Smoking status: Former Smoker -- 3.00 packs/day for 27 years    Types: Cigarettes    Quit date: 02/08/2001  . Smokeless tobacco: Never Used  . Alcohol Use: 0.6 oz/week    1 Cans of beer per week  . Drug Use: No  . Sexual Activity: Yes   Other Topics Concern  . Not on file   Social History Narrative   Patient lives at home with his wife [name]).    Retired.   Education 10th grade.   Right handed.   Caffeine two cups of  tea and two cups of coffee.     PHYSICAL EXAM  Filed Vitals:   08/09/13 1047  BP: 124/78  Pulse: 49  Height: 5\' 11"  (1.803 m)  Weight: 254 lb 9.6 oz (115.486 kg)   Body mass index is 35.53 kg/(m^2). Generalized: In no acute distress  Neck: Supple, no carotid bruits  Cardiac: Regular rate rhythm  Pulmonary: Clear to auscultation bilaterally  Musculoskeletal: No deformity  Neurological examination  Mentation: Alert oriented to time, place, history taking, and causual conversation, MMSE was 22/30, missing items in calculation and 3 of 3 recall .  Cranial nerve II-XII: Pupils were equal round reactive to light extraocular movements were full, visual field were full on confrontational test. facial sensation and strength were normal. hearing was intact to finger rubbing bilaterally. Uvula tongue midline. head turning and shoulder shrug and were normal and symmetric.Tongue protrusion into cheek strength was normal.  Motor: normal tone, bulk and strength. No focal weakness Sensory: Intact to fine touch, pinprick, preserved vibratory sensation, and proprioception at toes.  Coordination: Normal finger to nose, heel-to-shin bilaterally  Gait: Rising up from seated position without assistance, normal stance, without trunk ataxia, moderate stride, good arm swing, smooth turning, mild difficulty with left tiptoe walking , no assistive device Romberg signs:  Negative  Deep tendon reflexes: Brachioradialis 2/2, biceps 2/2, triceps 2/2, patellar 2/2, Achilles 2/2, plantar responses were flexor bilaterally.   DIAGNOSTIC DATA (LABS, IMAGING, TESTING) - I reviewed patient records, labs, notes, testing and imaging myself where available.  Lab Results  Component Value Date   WBC 11.9* 05/12/2013   HGB 14.2 05/12/2013   HCT 43.8 05/12/2013   MCV 91.4 05/12/2013   PLT 174 04/28/2013      Component Value Date/Time   NA 140 04/28/2013 2150   NA 133* 10/11/2012 0818   K 4.2 04/28/2013 2150   CL 101 04/28/2013 2150   CO2 24 04/28/2013 2150   GLUCOSE 92 04/28/2013 2150   GLUCOSE 99 10/11/2012 0818   BUN 14 04/28/2013 2150   BUN 10 10/11/2012 0818   CREATININE 0.90 04/28/2013 2150   CALCIUM 9.8 04/28/2013 2150   PROT 5.8* 10/11/2012 0818   PROT 7.0 10/07/2011 1845   ALBUMIN 4.0 10/07/2011 1845   AST 13 10/11/2012 0818   ALT 27 10/11/2012 0818   ALKPHOS 95 10/11/2012 0818   BILITOT 0.3 10/11/2012 0818   GFRNONAA >90 04/28/2013 2150   GFRAA >90 04/28/2013 2150    Lab Results  Component Value Date   VITAMINB12 339 10/11/2012   Lab Results  Component Value Date   TSH 1.270 10/11/2012      ASSESSMENT AND PLAN  53 y.o. year old male  has a past medical history of right AVM, status post right craniotomy, history of complex partial seizures and memory trouble. Labs to evaluate memory loss were  normal. Memory score is stable from last year. No seizure activity since last seen.  Continue Trileptal at current dose will refill Check labs today Followup yearly and when necessary Peter Barnett, [facility], [facility], APRN  [facility] [address] [place], [place] [address] [phone]

## 2013-08-22 ENCOUNTER — Encounter: Payer: Self-pay | Admitting: *Deleted

## 2013-08-24 ENCOUNTER — Encounter: Payer: Self-pay | Admitting: Interventional Cardiology

## 2013-08-29 ENCOUNTER — Encounter: Payer: Self-pay | Admitting: Family Medicine

## 2013-08-29 ENCOUNTER — Ambulatory Visit (INDEPENDENT_AMBULATORY_CARE_PROVIDER_SITE_OTHER): Payer: Medicare Other | Admitting: Family Medicine

## 2013-08-29 VITALS — BP 130/81 | HR 54 | Temp 98.0°F | Resp 18 | Ht 73.0 in | Wt 250.0 lb

## 2013-08-29 DIAGNOSIS — Z860100 Personal history of colon polyps, unspecified: Secondary | ICD-10-CM

## 2013-08-29 DIAGNOSIS — I872 Venous insufficiency (chronic) (peripheral): Secondary | ICD-10-CM

## 2013-08-29 DIAGNOSIS — E785 Hyperlipidemia, unspecified: Secondary | ICD-10-CM | POA: Insufficient documentation

## 2013-08-29 DIAGNOSIS — R569 Unspecified convulsions: Secondary | ICD-10-CM

## 2013-08-29 DIAGNOSIS — I10 Essential (primary) hypertension: Secondary | ICD-10-CM

## 2013-08-29 DIAGNOSIS — Z860101 Personal history of adenomatous and serrated colon polyps: Secondary | ICD-10-CM | POA: Insufficient documentation

## 2013-08-29 NOTE — Progress Notes (Signed)
Pre visit review using our clinic review tool, if applicable. No additional management support is needed unless otherwise documented below in the visit note. 

## 2013-08-29 NOTE — Progress Notes (Addendum)
Office Note 08/29/2013  CC:  Chief Complaint  Patient presents with  . Establish Care   HPI:  Peter Barnett is a 53 y.o. White male who is here to establish care. Patient's most recent primary MD: [facility] in [place]..  Dr. Irish Lack is cardiologist. Neuro: Dr. Krista Blue at [facility].  Neurosurgeon: Dr. Joya Salm.  Urologist: [facility]. GI: [facility] in [place].  Old records in EPIC/HL EMR reviewed prior to or during today's visit. His only acute complaint is mention of some intermittent mild swelling in both lower legs, worse towards end of day, goes down at night.  No SOB.  No cough.  No edema in UE's.  Past Medical History  Diagnosis Date  . HTN (hypertension)   . High cholesterol   . MI (myocardial infarction) 2009    "just had arm pain; never any chest pain"  . Exertional dyspnea 10/07/2011  . 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 Headache(784.0) 10/07/2011    "3-4 times/month"  . Pneumonia ~ 2010    "2 years in a row; May both times"  . Seizures 1981- present    petit mal  . Petit-mal epilepsy   . Chronic lower back pain     Still with recurrent left sided LBP with paresthesias down left leg --primarily with activities: pain pill use is avg <90 tabs per mo  . Urethral stricture     Recurrent dilation has been required [facility] urology)    Past Surgical History  Procedure Laterality Date  . Craniotomy for avm  1981    Dx'd after pt began having seizures.  . Tonsillectomy      "I was little"  . Lumbar disc surgery  2008; 2009    Dr. Joya Salm  . Coronary angioplasty with stent placement  2009    "3"  . Colonoscopy w/ polypectomy  age 25    [facility] MD--repeat due     Family History  Problem Relation Age of Onset  . Hyperlipidemia Mother   . Cancer Father     History   Social History  . Marital Status: Married    Spouse Name: [name]    Number of Children: 2  . Years of Education:  10 th   Occupational History  .      retired   Social History Main Topics  . Smoking status: Former Smoker -- 3.00 packs/day for 27 years    Types: Cigarettes    Quit date: 02/08/2001  . Smokeless tobacco: Never Used  . Alcohol Use: 0.6 oz/week    1 Cans of beer per week  . Drug Use: No  . Sexual Activity: Yes   Other Topics Concern  . Not on file   Social History Narrative   Patient lives at home with his wife [name]).   Two adult children.   Retired from [employer] approx age 88 due to medical reasons (disabled due to memory issues, seizure d/o and chronic LBP).   Education 10th grade.   Right handed.   Caffeine two cups of tea and two cups of coffee.   Tobacco 45 pack-yr hx, quit approx age 46.  Alcohol: 1-2 beers per night.     No hx of alc or drug problems.   No exercise.   Diet: regular    Outpatient Encounter Prescriptions as of 08/29/2013  Medication Sig  . aspirin 325 MG tablet Take 325 mg by mouth daily.  . cholecalciferol (  VITAMIN D) 1000 UNITS tablet Take 1,000 Units by mouth daily.  Marland Kitchen HYDROcodone-acetaminophen (LORTAB) 10-500 MG per tablet Take 1 tablet by mouth every 6 (six) hours as needed. For pain  . metoprolol tartrate (LOPRESSOR) 25 MG tablet Take 1 tablet (25 mg total) by mouth 2 (two) times daily.  Marland Kitchen oxcarbazepine (TRILEPTAL) 600 MG tablet Take 1 tablet (600 mg total) by mouth 3 (three) times daily. And one half at bedtime  . simvastatin (ZOCOR) 40 MG tablet Take 40 mg by mouth every evening.  . nitroGLYCERIN (NITROSTAT) 0.4 MG SL tablet Place 1 tablet (0.4 mg total) under the tongue every 5 (five) minutes as needed for chest pain (CP or SOB).    No Known Allergies  ROS Review of Systems  Constitutional: Negative for fever and fatigue.  HENT: Negative for congestion and sore throat.   Eyes: Negative for visual disturbance.  Respiratory: Negative for cough.   Cardiovascular: Negative for chest pain.  Gastrointestinal: Negative for nausea and  abdominal pain.  Genitourinary: Negative for dysuria.  Musculoskeletal: Negative for back pain and joint swelling.  Skin: Negative for rash.  Neurological: Negative for weakness and headaches.  Hematological: Negative for adenopathy.  Psychiatric/Behavioral: Negative for sleep disturbance.    PE; Blood pressure 130/81, pulse 54, temperature 98 F (36.7 C), temperature source Temporal, resp. rate 18, height 6\' 1"  (1.854 m), weight 250 lb (113.399 kg), SpO2 94.00%. Gen: Alert, well appearing.  Patient is oriented to person, place, time, and situation. QAS:TMHD: no injection, icteris, swelling, or exudate.  EOMI, PERRLA. Mouth: lips without lesion/swelling.  Oral mucosa pink and moist. Oropharynx without erythema, exudate, or swelling.  CV: RRR, no m/r/g.   LUNGS: CTA bilat, nonlabored resps, good aeration in all lung fields. EXT: no clubbing or cyanosis.  Trace pitting edema in pretibial regions and ankles bilat.  No rash, no skin breakdown, no tenderness.  No asymmetry.   Pertinent labs:  None today Recent labs:   Chemistry      Component Value Date/Time   NA 134 08/09/2013 1127   NA 140 04/28/2013 2150   K 4.9 08/09/2013 1127   CL 103 08/09/2013 1127   CO2 21 08/09/2013 1127   BUN 10 08/09/2013 1127   BUN 14 04/28/2013 2150   CREATININE 0.74* 08/09/2013 1127      Component Value Date/Time   CALCIUM 9.5 08/09/2013 1127   ALKPHOS 100 08/09/2013 1127   AST 26 08/09/2013 1127   ALT 32 08/09/2013 1127   BILITOT 0.3 08/09/2013 1127     Lab Results  Component Value Date   WBC 8.0 08/09/2013   HGB 15.3 08/09/2013   HCT 43.1 08/09/2013   MCV 86 08/09/2013   PLT 174 04/28/2013     ASSESSMENT AND PLAN:   New pt, here to establish care.  Obtain old PCP and GI records.  1) HTN; The current medical regimen is effective;  continue present plan and medications.  2) Seizure d/o: petit mal.  Approp control and f/u by neuro.  Continue trileptal.   Recent monitoring labs normal.  3) CAD: asymptomatic.   Continue aspirin, beta blocker, statin.  4) LE venous insufficiency edema: very mild. Discussed low sodium diet today.  5) hx of adenomatous colon polyps: pt unclear on exactly when his colonoscopy was/when recall is supposed to be. Will review records when they are sent.  An After Visit Summary was printed and given to the patient.  Return for appt for CPE 2 months (fasting).

## 2013-08-30 ENCOUNTER — Telehealth: Payer: Self-pay | Admitting: Family Medicine

## 2013-08-30 NOTE — Telephone Encounter (Signed)
Relevant patient education assigned to patient using Emmi. ° °

## 2013-09-03 ENCOUNTER — Encounter: Payer: Self-pay | Admitting: Family Medicine

## 2013-10-22 ENCOUNTER — Ambulatory Visit: Payer: Medicare Other | Admitting: Interventional Cardiology

## 2013-10-25 ENCOUNTER — Encounter: Payer: Self-pay | Admitting: Interventional Cardiology

## 2013-10-25 ENCOUNTER — Ambulatory Visit (INDEPENDENT_AMBULATORY_CARE_PROVIDER_SITE_OTHER): Payer: Medicare Other | Admitting: Interventional Cardiology

## 2013-10-25 VITALS — BP 122/70 | HR 52 | Ht 70.0 in | Wt 244.0 lb

## 2013-10-25 DIAGNOSIS — I252 Old myocardial infarction: Secondary | ICD-10-CM | POA: Insufficient documentation

## 2013-10-25 DIAGNOSIS — R5381 Other malaise: Secondary | ICD-10-CM

## 2013-10-25 DIAGNOSIS — I2510 Atherosclerotic heart disease of native coronary artery without angina pectoris: Secondary | ICD-10-CM | POA: Insufficient documentation

## 2013-10-25 DIAGNOSIS — R5383 Other fatigue: Secondary | ICD-10-CM

## 2013-10-25 DIAGNOSIS — E785 Hyperlipidemia, unspecified: Secondary | ICD-10-CM

## 2013-10-25 DIAGNOSIS — I10 Essential (primary) hypertension: Secondary | ICD-10-CM

## 2013-10-25 MED ORDER — METOPROLOL TARTRATE 25 MG PO TABS: 12.5000 mg | ORAL_TABLET | Freq: Two times a day (BID) | ORAL | Status: DC

## 2013-10-25 NOTE — Patient Instructions (Addendum)
Your physician has recommended you make the following change in your medication:   1. Decrease Metoprolol 25 mg to 1/2 tablet twice a day.  Your physician wants you to follow-up in: [place] DR. VARANASI. You will receive a reminder letter in the mail two months in advance. If you don't receive a letter, please call our office to schedule the follow-up appointment.

## 2013-10-25 NOTE — Progress Notes (Signed)
Patient ID: [name], male   DOB: [birth year], 53 y.o.   MRN: [id number]    [place], [place]  [address] Phone: [phone] Fax:  [phone]  Date:  10/25/2013   ID:  Peter Barnett, DOB 1960/11/23, MRN [id number]  PCP:  Tammi Sou, MD      History of Present Illness: Peter Barnett is a 53 y.o. male with premature atherosclerosis. He has had several DES. He has had several episodes of sharp pain. Chest Pain:  Mild DOE with exercise. Limited by back pain. One episode of chest pain that lasted an hour. Used NTG with relief. c/o Chest pain.  c/o Leg edema improving.  Denies : Palpitations Dizziness.  Trouble with meds.  Cough.  Heartburn.    [employer] work is most strenuous activity.  He has fatigue.    Wt Readings from Last 3 Encounters:  10/25/13 244 lb (110.678 kg)  08/29/13 250 lb (113.399 kg)  08/09/13 254 lb 9.6 oz (115.486 kg)     Past Medical History  Diagnosis Date  . HTN (hypertension)   . High cholesterol   . MI (myocardial infarction) 2009    "just had arm pain; never any chest pain"  . Exertional dyspnea 10/07/2011  . 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 Headache(784.0) 10/07/2011     a couple per month  . Pneumonia ~ 2010    "2 years in a row; May both times"  . Petit-mal epilepsy 1981-present  . Chronic lower back pain     Still with recurrent left sided LBP with paresthesias down left leg --primarily with activities: pain pill use is avg <90 tabs per mo  . Urethral stricture since 1981    Recurrent dilation has been required [facility] urology)  . History of adenomatous polyp of colon 02/2009    Recall 5 yrs ([facility])    Current Outpatient Prescriptions  Medication Sig Dispense Refill  . docusate sodium (COLACE) 250 MG capsule Take 250 mg by mouth daily.      Marland Kitchen aspirin 325 MG tablet Take 325 mg by mouth daily.      . cholecalciferol (VITAMIN D) 1000 UNITS  tablet Take 1,000 Units by mouth daily.      Marland Kitchen HYDROcodone-acetaminophen (LORTAB) 10-500 MG per tablet Take 1 tablet by mouth every 6 (six) hours as needed. For pain      . metoprolol tartrate (LOPRESSOR) 25 MG tablet Take 1 tablet (25 mg total) by mouth 2 (two) times daily.  60 tablet  11  . nitroGLYCERIN (NITROSTAT) 0.4 MG SL tablet Place 1 tablet (0.4 mg total) under the tongue every 5 (five) minutes as needed for chest pain (CP or SOB).  25 tablet  6  . oxcarbazepine (TRILEPTAL) 600 MG tablet Take 1 tablet (600 mg total) by mouth 3 (three) times daily. And one half at bedtime  105 tablet  11  . simvastatin (ZOCOR) 40 MG tablet Take 40 mg by mouth every evening.       No current facility-administered medications for this visit.    Allergies:   No Known Allergies  Social History:  The patient  reports that he quit smoking about 12 years ago. His smoking use included Cigarettes. He has a 81 pack-year smoking history. He has never used smokeless tobacco. He reports that he drinks about .6 ounces of alcohol per week. He reports that he does not  use illicit drugs.   Family History:  The patient's family history includes Cancer in his father; Hyperlipidemia in his mother.   ROS:  Please see the history of present illness.  No nausea, vomiting.  No fevers, chills.  No focal weakness.  No dysuria.    All other systems reviewed and negative.   PHYSICAL EXAM: VS:  BP 122/70  Pulse 52  Ht 5\' 10"  (1.778 m)  Wt 244 lb (110.678 kg)  BMI 35.01 kg/m2 Well nourished, well developed, in no acute distress HEENT: normal Neck: no JVD, no carotid bruits Cardiac:  normal S1, S2; RRR;  Lungs:  clear to auscultation bilaterally, no wheezing, rhonchi or rales Abd: soft, nontender, no hepatomegaly Ext: no edema Skin: warm and dry Neuro:   no focal abnormalities noted  EKG:  NSR, NSST     ASSESSMENT AND PLAN:  Coronary atherosclerosis of native coronary artery  Refill Nitroglycerin Tablet Sublingual,  0.4 MG, 1 tablet under the tongue, Sublingual, as directed., 30 days, 25, Refills 3 IMAGING: EKG    Stegall,Amy 10/19/2012 08:38:31 AM > Wiley Magan,JAY 10/19/2012 09:08:06 AM > NSR, LAD, no ST segment changes   Notes: No angina recently. Used NTG once several months ago but none since. KNown distal RCA/PDA occlusion from prior cath.   If NTG use gets more ffrequent, would plan on cardaic cath.  Known CTO of distal RCA.     2. Mixed hyperlipidemia  Continue Zetia Tablet, 10 MG, 1 tablet, Orally, Once a day Continue Crestor Tablet, 20 MG, TAKE 1 TABLET BY MOUTH EVERY DAY Notes: LDL target <100. To be checked by PMD.     3. Leg pain  IMAGING: EC Lower Extremity Doppler 2014 showed no significant LE arterial diesase.  Likely from back problem.       Notes: Eval for PAD given h/o early atherosclerosis.    4. Bilateral leg edema  Notes: Elevate legs at night.    5.  Fatigue: Decrease metoprolol to 12.5 mg BID.   Preventive Medicine  Adult topics discussed:  Exercise: 5 days a week, at least 30 minutes of aerobic exercise.      Signed, Mina Marble, MD, [facility] 10/25/2013 2:10 PM

## 2013-10-30 ENCOUNTER — Encounter: Payer: Self-pay | Admitting: Family Medicine

## 2013-10-31 ENCOUNTER — Encounter: Payer: Medicare Other | Admitting: Family Medicine

## 2013-10-31 ENCOUNTER — Encounter: Payer: Self-pay | Admitting: Nurse Practitioner

## 2013-10-31 ENCOUNTER — Ambulatory Visit (INDEPENDENT_AMBULATORY_CARE_PROVIDER_SITE_OTHER): Payer: Medicare Other | Admitting: Nurse Practitioner

## 2013-10-31 VITALS — BP 133/82 | HR 50 | Temp 98.5°F | Resp 18 | Ht 70.0 in | Wt 247.0 lb

## 2013-10-31 DIAGNOSIS — Z0000 Encounter for general adult medical examination without abnormal findings: Secondary | ICD-10-CM

## 2013-10-31 DIAGNOSIS — E785 Hyperlipidemia, unspecified: Secondary | ICD-10-CM

## 2013-10-31 DIAGNOSIS — Z23 Encounter for immunization: Secondary | ICD-10-CM

## 2013-10-31 DIAGNOSIS — I252 Old myocardial infarction: Secondary | ICD-10-CM

## 2013-10-31 DIAGNOSIS — I10 Essential (primary) hypertension: Secondary | ICD-10-CM

## 2013-10-31 DIAGNOSIS — Z6835 Body mass index (BMI) 35.0-35.9, adult: Secondary | ICD-10-CM

## 2013-10-31 DIAGNOSIS — Z87891 Personal history of nicotine dependence: Secondary | ICD-10-CM

## 2013-10-31 LAB — LIPID PANEL
CHOLESTEROL: 158 mg/dL (ref 0–200)
HDL: 37.8 mg/dL — ABNORMAL LOW (ref >39.00)
LDL Cholesterol: 95 mg/dL (ref 0–99)
NONHDL: 120.2
TRIGLYCERIDES: 128 mg/dL (ref 0.0–149.0)
Total CHOL/HDL Ratio: 4
VLDL: 25.6 mg/dL (ref 0.0–40.0)

## 2013-10-31 LAB — PSA: PSA: 1.23 ng/mL (ref 0.10–4.00)

## 2013-10-31 LAB — VITAMIN B12: Vitamin B-12: 317 pg/mL (ref 211–911)

## 2013-10-31 LAB — VITAMIN D 25 HYDROXY (VIT D DEFICIENCY, FRACTURES): VITD: 36.01 ng/mL (ref 30.00–100.00)

## 2013-10-31 LAB — HEMOGLOBIN A1C: HEMOGLOBIN A1C: 5.8 % (ref 4.6–6.5)

## 2013-10-31 LAB — MAGNESIUM: Magnesium: 2 mg/dL (ref 1.5–2.5)

## 2013-10-31 LAB — TSH: TSH: 1.33 u[IU]/mL (ref 0.35–4.50)

## 2013-10-31 MED ORDER — COQ10 50 MG PO CAPS: 50.0000 mg | ORAL_CAPSULE | Freq: Every day | ORAL | Status: DC

## 2013-10-31 NOTE — Progress Notes (Signed)
Pre visit review using our clinic review tool, if applicable. No additional management support is needed unless otherwise documented below in the visit note. 

## 2013-10-31 NOTE — Patient Instructions (Signed)
Start CoQ10.  Please make diet changes for best health. See handout. Do it for 6 weeks & come back for weight check. You will feel better.  Develop lifelong habits of exercise most days of the week: take a 30 minute walk. The benefits include weight loss, lower risk for heart disease, diabetes, stroke, high blood pressure, lower rates of depression & dementia, better sleep quality & bone health.  See [facility].  Schedule colonoscopy f/u for next year.  We will call with lab results.  Nice to meet you.

## 2013-11-01 ENCOUNTER — Telehealth: Payer: Self-pay | Admitting: Nurse Practitioner

## 2013-11-01 LAB — HIV ANTIBODY (ROUTINE TESTING W REFLEX): HIV: NONREACTIVE

## 2013-11-01 LAB — HEPATITIS C ANTIBODY: HCV AB: NEGATIVE

## 2013-11-01 NOTE — Progress Notes (Signed)
Subjective:     Peter Barnett is a 53 y.o. male and is here for a comprehensive physical exam. The patient reports no problems. He is a new patient to Dr Anitra Lauth. I am seeing him today in Dr Idelle Leech absence. Peter Barnett is accompanied by his wife. 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 He is followed by cardiology who also treats HTN.  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 He has had seizures since then, but no episodes in last year. He takes trileptal & is followed by neurology. 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 He smoked for >25 yrs, 2 ppd: Quit in "03. He does not exercise, but is active a couple days /week doing yard work.  He lost a few pounds since last visit  2 mos ago. He is retired.  History   Social History  . Marital Status: Married    Spouse Name: Peter Barnett    Number of Children: 2  . Years of Education: 10 th   Occupational History  .      retired   Social History Main Topics  . Smoking status: Former Smoker -- 3.00 packs/day for 27 years    Types: Cigarettes    Quit date: 02/08/2001  . Smokeless tobacco: Never Used  . Alcohol Use: 0.6 oz/week    1 Cans of beer per week  . Drug Use: No  . Sexual Activity: Yes   Other Topics Concern  . Not on file   Social History Narrative   Patient lives at home with his wife Peter Barnett).   Two adult children.   Retired from [employer] approx age 23 due to medical reasons (disabled due to memory issues, seizure d/o and chronic LBP).   Education 10th grade.   Right handed.   Caffeine two cups of tea and two cups of coffee.   Tobacco 45 pack-yr hx, quit approx age 89.  Alcohol: 1-2 beers per night.     No hx of alc or drug problems.   No exercise.   Diet: regular   Health Maintenance  Topic Date Due  . Influenza Vaccine  09/09/2014  . Colonoscopy  02/12/2019  . Tetanus/tdap  11/01/2023    The following portions of the patient's history were reviewed  and updated as appropriate: allergies, current medications, past family history, past medical history, past social history, past surgical history and problem list.  Review of Systems Pertinent items are noted in HPI.   Objective:    BP 133/82  Pulse 50  Temp(Src) 98.5 F (36.9 C) (Oral)  Resp 18  Ht 5\' 10"  (1.778 m)  Wt 247 lb (112.038 kg)  BMI 35.44 kg/m2  SpO2 95% General appearance: alert, cooperative, appears stated age and no distress Head: Normocephalic, without obvious abnormality, atraumatic Eyes: negative findings: lids and lashes normal, conjunctivae and sclerae normal, corneas clear and pupils equal, round, reactive to light and accomodation Ears: normal TM's and external ear canals both ears Throat: lips, mucosa, and tongue normal; teeth and gums normal Neck: no adenopathy, no carotid bruit, supple, symmetrical, trachea midline and thyroid not enlarged, symmetric, no tenderness/mass/nodules Lungs: clear to auscultation bilaterally Heart: regular rate and rhythm, S1, S2 normal, no murmur, click, rub or gallop Abdomen: soft, non-tender; bowel sounds normal; no masses,  no organomegaly Extremities: edema +1 pitting edema bilat LE, from feet to few inches above ankles. Pulses: 2+ and symmetric Skin: .5 cm lesion back-multi-colored  light & dark brown-looks like seborrheic keratosis. Cluster of dk brown nevi L lower back about 2 cm. Lymph nodes: Cervical, supraclavicular, and axillary nodes normal. Neurologic: Grossly normal    Assessment:  1. Other and unspecified hyperlipidemia - Lipid panel - Coenzyme Q10 (COQ10) 50 MG CAPS; Take 50 mg by mouth daily.  Dispense: 90 capsule; Refill: 2  2. Essential hypertension, benign - Microalbumin / creatinine urine ratio Daily exercise, weight loss  3. Preventative health care ! Needs prostate exam at f/u appt. - Lipid panel - Vitamin B12 - Vit D  25 hydroxy (rtn osteoporosis monitoring) - Magnesium - TSH - Hemoglobin A1c -  HIV antibody - Hepatitis C antibody - PSA - Microalbumin / creatinine urine ratio  4. BMI 35.0-35.9,adult - Lipid panel - TSH - Hemoglobin A1c Gave diet plan w/grocery list F/u 6 weeks.  5. History of smoking 25-50 pack years - CT CHEST LOW DOSE SCREENING W/O CM; Future  6. History of MI (myocardial infarction) On statin - Coenzyme Q10 (COQ10) 50 MG CAPS; Take 50 mg by mouth daily.  Dispense: 90 capsule; Refill: 2  7. Need for prophylactic vaccination and inoculation against influenza - Tdap vaccine greater than or equal to 7yo IM  F/u 6 weeks-will need prostate/genitalia exam-did not do today.

## 2013-11-01 NOTE — Telephone Encounter (Signed)
pls call pt: Advise needs to start Vit D 1000 D3 daily with meal. Start B complex daily. He is pre-diabetic, so diet changes are important to prevent becoming diabetic. Make changes discussed in office. All other labs look good.

## 2013-11-06 NOTE — Telephone Encounter (Signed)
Spoke with pt, advised lab results. Pt understood. 

## 2013-11-06 NOTE — Telephone Encounter (Signed)
Left message for pt to call back  °

## 2013-11-12 ENCOUNTER — Ambulatory Visit
Admission: RE | Admit: 2013-11-12 | Discharge: 2013-11-12 | Disposition: A | Discharge status: Home / Self Care | Payer: Medicare Other | Source: Ambulatory Visit | Attending: Nurse Practitioner | Admitting: Nurse Practitioner

## 2013-11-12 DIAGNOSIS — Z87891 Personal history of nicotine dependence: Secondary | ICD-10-CM

## 2013-11-14 ENCOUNTER — Encounter: Payer: Self-pay | Admitting: Family Medicine

## 2013-12-05 ENCOUNTER — Encounter: Payer: Self-pay | Admitting: Family Medicine

## 2013-12-05 ENCOUNTER — Ambulatory Visit (INDEPENDENT_AMBULATORY_CARE_PROVIDER_SITE_OTHER): Payer: Medicare Other | Admitting: Family Medicine

## 2013-12-05 VITALS — BP 128/79 | HR 56 | Temp 98.4°F | Resp 18 | Ht 70.0 in | Wt 246.0 lb

## 2013-12-05 DIAGNOSIS — Z125 Encounter for screening for malignant neoplasm of prostate: Secondary | ICD-10-CM

## 2013-12-05 NOTE — Assessment & Plan Note (Signed)
DRE normal today. Recent PSA normal. This completes his annual health maintenance exam: no charge today.

## 2013-12-05 NOTE — Progress Notes (Signed)
Pre visit review using our clinic review tool, if applicable. No additional management support is needed unless otherwise documented below in the visit note. 

## 2013-12-05 NOTE — Progress Notes (Signed)
OFFICE VISIT  12/05/2013   CC:  Chief Complaint  Patient presents with  . Follow-up    finish CPE, needs prostate exam.     HPI:    Patient is a 53 y.o. Caucasian male who presents for prostate cancer screening to finish his recent annual health maintenance exam.  He had a full CPE 10/31/13 in my absence--was seen by FNP Nicky Pugh and DRE was deferred at that time. Lab Results  Component Value Date   PSA 1.23 10/31/2013   Extensive routine fasting labs at time of CPE were all normal.  Has intermittent sx's of urethral stricture that he gets dilatation for sometimes.  No sx's of this currently.  Nocturia x 2.  No urinary frequency.  He does have some urinary hesitancy, starts dribbling---but then says this is not a consistent symptom.  No dysuria, no urgency, no hematuria.  Past Medical History  Diagnosis Date  . HTN (hypertension)   . CAD (coronary artery disease)     Premature; stents + known distal RCA/PDA occlusion  . MI (myocardial infarction) 2009    "just had arm pain; never any chest pain"  . Exertional dyspnea 10/07/2011  . 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 Headache(784.0) 10/07/2011     a couple per month  . Pneumonia ~ 2010    "2 years in a row; May both times"  . Petit-mal epilepsy 1981-present  . Chronic lower back pain     Still with recurrent left sided LBP with paresthesias down left leg --primarily with activities: pain pill use is avg <90 tabs per mo  . Urethral stricture since 1981    Recurrent dilation has been required [facility] urology)  . History of adenomatous polyp of colon 02/2009    Recall 5 yrs ([facility])  . COPD (chronic obstructive pulmonary disease)     NOTED on CT chest 11/2013 done for lung ca screening    Past Surgical History  Procedure Laterality Date  . Craniotomy for avm  1981    Dx'd after pt began having seizures.  . Tonsillectomy      "I was little"  . Lumbar  disc surgery  2008; 2009    Dr. Joya Salm  . Coronary angioplasty with stent placement  2009    3 DES; still with known distal RCA/PDA occlusion  . Colonoscopy w/ polypectomy  02/2009    Recall 5 yr (aden poly, hyperplast polyp, int and ext hemorr)    Outpatient Prescriptions Prior to Visit  Medication Sig Dispense Refill  . aspirin 325 MG tablet Take 325 mg by mouth daily.      . cholecalciferol (VITAMIN D) 1000 UNITS tablet Take 1,000 Units by mouth daily.      . Coenzyme Q10 (COQ10) 50 MG CAPS Take 50 mg by mouth daily.  90 capsule  2  . docusate sodium (COLACE) 250 MG capsule Take 250 mg by mouth daily.      Marland Kitchen HYDROcodone-acetaminophen (LORTAB) 10-500 MG per tablet Take 1 tablet by mouth every 6 (six) hours as needed. For pain      . metoprolol tartrate (LOPRESSOR) 25 MG tablet Take 0.5 tablets (12.5 mg total) by mouth 2 (two) times daily.  60 tablet  11  . oxcarbazepine (TRILEPTAL) 600 MG tablet Take 1 tablet (600 mg total) by mouth 3 (three) times daily. And one half at bedtime  105 tablet  11  . simvastatin (ZOCOR) 40 MG  tablet Take 40 mg by mouth every evening.      . nitroGLYCERIN (NITROSTAT) 0.4 MG SL tablet Place 1 tablet (0.4 mg total) under the tongue every 5 (five) minutes as needed for chest pain (CP or SOB).  25 tablet  6   No facility-administered medications prior to visit.    No Known Allergies  ROS As per HPI  PE: Blood pressure 128/79, pulse 56, temperature 98.4 F (36.9 C), temperature source Temporal, resp. rate 18, height 5\' 10"  (1.778 m), weight 246 lb (111.585 kg), SpO2 96.00%. Gen: Alert, well appearing.  Patient is oriented to person, place, time, and situation. Rectal exam: negative without mass, lesions or tenderness, PROSTATE EXAM: smooth and symmetric without nodules or tenderness.   IMPRESSION AND PLAN:  Prostate cancer screening DRE normal today. Recent PSA normal. This completes his annual health maintenance exam: no charge today.  An After  Visit Summary was printed and given to the patient.  FOLLOW UP: Return in about 1 year (around 12/06/2014) for annual CPE (fasting).

## 2014-01-02 ENCOUNTER — Encounter: Payer: Self-pay | Admitting: Neurology

## 2014-02-26 ENCOUNTER — Encounter: Payer: Self-pay | Admitting: Family Medicine

## 2014-03-02 ENCOUNTER — Encounter: Payer: Self-pay | Admitting: Family Medicine

## 2014-03-20 ENCOUNTER — Ambulatory Visit (INDEPENDENT_AMBULATORY_CARE_PROVIDER_SITE_OTHER): Payer: Managed Care, Other (non HMO) | Admitting: Family Medicine

## 2014-03-20 VITALS — BP 124/80 | HR 73 | Temp 98.7°F | Resp 16 | Ht 70.0 in | Wt 245.5 lb

## 2014-03-20 DIAGNOSIS — R071 Chest pain on breathing: Secondary | ICD-10-CM

## 2014-03-20 DIAGNOSIS — R0789 Other chest pain: Secondary | ICD-10-CM

## 2014-03-20 MED ORDER — PREDNISONE 20 MG PO TABS: 20.0000 mg | ORAL_TABLET | Freq: Every day | ORAL | Status: DC

## 2014-03-20 MED ORDER — CYCLOBENZAPRINE HCL 10 MG PO TABS: ORAL_TABLET | ORAL | Status: DC

## 2014-03-20 NOTE — Progress Notes (Addendum)
Peter Barnett - 54 y.o. male MRN [id number]  Date of birth: 08-06-1960  SUBJECTIVE:  Including CC & ROS.  Patient is a pleasant 54 year old male presenting today with bilateral axillary chest wall pain. Pain started on Saturday after lifting approximately 20 pound box care in approximately 10 feet. Shreds pain as a sharp sensation with certain movements. Feels more comfortable sitting up and leaning forward. Has attempted to take some hydrocodone with minimal pain relief. Pain was exacerbated again yesterday after lifting another object. Past medical history and risk factors include coronary artery disease status post LAD and RCA stents in 2009, hyperlipidemia, hypertension, former tobacco abuser. Patient denies that this current pain feels anything like his previous MI. He reports his previous he did not have any chest pain and only had discomfort in his left arm. Concerning cardiac associated symptoms patient denies any shortness of breath, central chest pain, radiation of pain to his left arm or jaw, no nausea, no diaphoresis, or dizziness.   ROS:  Constitutional:  No fever, chills, or fatigue.  Respiratory:  No shortness of breath, cough, or wheezing Cardiovascular:  No palpitations, chest pain or syncope Gastrointestinal:  No nausea, no abdominal pain Review of systems otherwise negative except for what is stated in HPI  HISTORY: Past Medical, Surgical, Social, and Family History Reviewed & Updated per EMR. Pertinent Historical Findings include: See HPI  PHYSICAL EXAM:  VS: BP:124/80 mmHg  HR:73bpm  TEMP:98.7 F (37.1 C)(Oral)  RESP:97 %  HT:5\' 10"  (177.8 cm)   WT:245 lb 8 oz (111.358 kg)  BMI:35.3 PHYSICAL EXAM: General:  Alert and oriented, No acute distress.   HENT:  Normocephalic, Oral mucosa is moist.   Respiratory:  Lungs are clear to auscultation, Respirations are non-labored, Symmetrical chest wall expansion.   Cardiovascular:  Normal rate, Regular rhythm, No murmur, Good  pulses equal in all extremities, No edema.   Gastrointestinal:  Soft, Non-tender, Non-distended, Normal bowel sounds, No organomegaly.   Integumentary:  Warm, Dry, No rash.   MSK:  Patient has mild tenderness to palpation over lateral ribs 7,8,9  Bilaterally. He has mild pain with thoracic rotation particularly on the left side. Mild sourness with laying flat and arms over head.  Neurologic:  Alert, Oriented, No focal defects Psychiatric:  Cooperative, Appropriate mood & affect.     ASSESSMENT & PLAN:  Patient's a 54 year old gentleman who presented today with bilateral intercostal and latissimus dorsi soreness has been present for the past 5 days. Patient related his pain to started right after doing some lifting over the weekend. Denied any associated cardiac symptoms of shortness of breath, central chest pain, radiation of pain. Patient's symptoms were really exacerbated by doing some other lifting on Tuesday. He is tried pain medication with no significant relief. Given the patient's history of cardiac disease recommended ordering an   EKG to evaluate heart. EKG showed normal sinus rhythm with no ST segment elevation or T-wave abnormalities. Compared to 10/2013  This point in time as suspect the majority of his symptoms are from muster skeletal strain. In order to avoid NSAIDs place patient on a 5 day prednisone  burst 20 mg a day. Also provided when necessary muscle relaxers for additional symptomatic relief. Advised patient if symptoms do not continue to improve or resolve over the next week I recommend seeing his cardiologist for possible stress test versus LHC.   We spent greater than 50% of our 25 minute office visit in counseling education regarding the patient current clinical problem,  risks and benefits of treatment options, and recommend plans for treatment or further evaluation

## 2014-03-28 ENCOUNTER — Other Ambulatory Visit: Payer: Self-pay | Admitting: Family Medicine

## 2014-03-28 ENCOUNTER — Ambulatory Visit (INDEPENDENT_AMBULATORY_CARE_PROVIDER_SITE_OTHER): Payer: Managed Care, Other (non HMO) | Admitting: Family Medicine

## 2014-03-28 ENCOUNTER — Telehealth: Payer: Self-pay | Admitting: Family Medicine

## 2014-03-28 ENCOUNTER — Ambulatory Visit (HOSPITAL_BASED_OUTPATIENT_CLINIC_OR_DEPARTMENT_OTHER)
Admission: RE | Admit: 2014-03-28 | Discharge: 2014-03-28 | Disposition: A | Discharge status: Home / Self Care | Payer: Medicare HMO | Source: Ambulatory Visit | Attending: Family Medicine | Admitting: Family Medicine

## 2014-03-28 ENCOUNTER — Encounter: Payer: Self-pay | Admitting: Family Medicine

## 2014-03-28 VITALS — BP 139/85 | HR 57 | Temp 97.8°F | Resp 18 | Ht 70.0 in | Wt 245.0 lb

## 2014-03-28 DIAGNOSIS — N200 Calculus of kidney: Secondary | ICD-10-CM | POA: Diagnosis not present

## 2014-03-28 DIAGNOSIS — M7918 Myalgia, other site: Secondary | ICD-10-CM

## 2014-03-28 DIAGNOSIS — I252 Old myocardial infarction: Secondary | ICD-10-CM | POA: Diagnosis not present

## 2014-03-28 DIAGNOSIS — R109 Unspecified abdominal pain: Secondary | ICD-10-CM

## 2014-03-28 DIAGNOSIS — R1011 Right upper quadrant pain: Secondary | ICD-10-CM | POA: Diagnosis not present

## 2014-03-28 LAB — CBC WITH DIFFERENTIAL/PLATELET
Basophils Absolute: 0 10*3/uL (ref 0.0–0.1)
Basophils Relative: 0.5 % (ref 0.0–3.0)
Eosinophils Absolute: 0.1 10*3/uL (ref 0.0–0.7)
Eosinophils Relative: 1.3 % (ref 0.0–5.0)
HCT: 46.6 % (ref 39.0–52.0)
Hemoglobin: 15.6 g/dL (ref 13.0–17.0)
Lymphocytes Relative: 28.1 % (ref 12.0–46.0)
Lymphs Abs: 2.9 10*3/uL (ref 0.7–4.0)
MCHC: 33.6 g/dL (ref 30.0–36.0)
MCV: 88.8 fl (ref 78.0–100.0)
MONOS PCT: 9.7 % (ref 3.0–12.0)
Monocytes Absolute: 1 10*3/uL (ref 0.1–1.0)
NEUTROS PCT: 60.4 % (ref 43.0–77.0)
Neutro Abs: 6.2 10*3/uL (ref 1.4–7.7)
Platelets: 228 10*3/uL (ref 150.0–400.0)
RBC: 5.24 Mil/uL (ref 4.22–5.81)
RDW: 13.9 % (ref 11.5–15.5)
WBC: 10.3 10*3/uL (ref 4.0–10.5)

## 2014-03-28 LAB — COMPREHENSIVE METABOLIC PANEL
ALT: 32 U/L (ref 0–53)
AST: 18 U/L (ref 0–37)
Albumin: 4.2 g/dL (ref 3.5–5.2)
Alkaline Phosphatase: 78 U/L (ref 39–117)
BILIRUBIN TOTAL: 0.5 mg/dL (ref 0.2–1.2)
BUN: 12 mg/dL (ref 6–23)
CO2: 27 mEq/L (ref 19–32)
CREATININE: 0.92 mg/dL (ref 0.40–1.50)
Calcium: 9.5 mg/dL (ref 8.4–10.5)
Chloride: 103 mEq/L (ref 96–112)
GFR: 90.91 mL/min (ref >60.00)
Glucose, Bld: 83 mg/dL (ref 70–99)
Potassium: 4 mEq/L (ref 3.5–5.1)
Sodium: 137 mEq/L (ref 135–145)
Total Protein: 6.6 g/dL (ref 6.0–8.3)

## 2014-03-28 MED ORDER — METOPROLOL TARTRATE 25 MG PO TABS: 12.5000 mg | ORAL_TABLET | Freq: Two times a day (BID) | ORAL | Status: DC

## 2014-03-28 MED ORDER — OXYCODONE HCL 5 MG PO TABS: ORAL_TABLET | ORAL | Status: DC

## 2014-03-28 NOTE — Assessment & Plan Note (Signed)
From an exam standpoint this seems to be musculoskeletal in origin, but there is no history of trauma or strain (he states he had lifted a "small box" a couple of days before feeling the onset of his pain, but it was nothing that he felt a strain after).   No skin changes suggestive of shingles. Would like to have UA but pt unable to urinate for me today. Will check noncontrast CT to r/o stone in renal pelvis. Check CBC w/diff and CMET today as well. I do not find anything to support any lung or heart abnormality as the etiology of this pain.

## 2014-03-28 NOTE — Progress Notes (Signed)
OFFICE VISIT  03/28/2014   CC:  Chief Complaint  Patient presents with  . Follow-up  . Flank Pain    saw at Urgent but still hurting on R side.    HPI:    Patient is a 54 y.o. Caucasian male who presents for pain on right side of body. Saw MD at [facility] on [place] on 03/20/14 and I reviewed this note and associated w/u today. Had onset of constant pain in both anterior inferior rib areas about 10d ago "out of the blue", R>>L, constant, severity is "above 10/10", seems to keep getting worse, hurts to take a deep breath.  The patient is difficult historian--can't seem to answer any question with certainty.  NO nausea, no diaphoresis, no sense of SOB or DOE or worsening of the CP with walking.   No fevers.  No skin changes.  No change in his chronic back pain.  No abd pain.  Past Medical History  Diagnosis Date  . HTN (hypertension)   . CAD (coronary artery disease)     Premature; stents + known distal RCA/PDA occlusion  . MI (myocardial infarction) 2009    "just had arm pain; never any chest pain"  . Exertional dyspnea 10/07/2011  . 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 Headache(784.0) 10/07/2011     a couple per month  . Pneumonia ~ 2010    "2 years in a row; May both times"  . Petit-mal epilepsy 1981-present  . Chronic lower back pain     Still with recurrent left sided LBP with paresthesias down left leg --primarily with activities: pain pill use is avg <90 tabs per mo  . Urethral stricture since 1981    Recurrent dilation has been required [facility] urology)  . History of adenomatous polyp of colon 02/2009;02/2014    Recall 5 yrs ([facility])  . COPD (chronic obstructive pulmonary disease)     NOTED on CT chest 11/2013 done for lung ca screening  . Hyperlipidemia     Past Surgical History  Procedure Laterality Date  . Craniotomy for avm  1981    Dx'd after pt began having seizures.  . Tonsillectomy      "I was little"  .  Lumbar disc surgery  2008; 2009    Dr. Joya Salm  . Coronary angioplasty with stent placement  2009    3 DES; still with known distal RCA/PDA occlusion  . Colonoscopy w/ polypectomy  02/2009;02/2014    Recall 5 yr (aden poly, hyperplast polyp, int and ext hemorr).  Next recall is 02/2019.    Outpatient Prescriptions Prior to Visit  Medication Sig Dispense Refill  . aspirin 325 MG tablet Take 325 mg by mouth daily.    . cholecalciferol (VITAMIN D) 1000 UNITS tablet Take 1,000 Units by mouth daily.    Marland Kitchen docusate sodium (COLACE) 250 MG capsule Take 250 mg by mouth daily.    Marland Kitchen HYDROcodone-acetaminophen (LORTAB) 10-500 MG per tablet Take 1 tablet by mouth every 6 (six) hours as needed. For pain    . metoprolol tartrate (LOPRESSOR) 25 MG tablet Take 0.5 tablets (12.5 mg total) by mouth 2 (two) times daily. 60 tablet 11  . oxcarbazepine (TRILEPTAL) 600 MG tablet Take 1 tablet (600 mg total) by mouth 3 (three) times daily. And one half at bedtime 105 tablet 11  . simvastatin (ZOCOR) 40 MG tablet Take 40 mg by mouth every evening.    . Coenzyme Q10 (  COQ10) 50 MG CAPS Take 50 mg by mouth daily. (Patient not taking: Reported on 03/28/2014) 90 capsule 2  . cyclobenzaprine (FLEXERIL) 10 MG tablet 1/2 to 1 tablet twice daily PRN muscle spasm (Patient not taking: Reported on 03/28/2014) 20 tablet 0  . nitroGLYCERIN (NITROSTAT) 0.4 MG SL tablet Place 1 tablet (0.4 mg total) under the tongue every 5 (five) minutes as needed for chest pain (CP or SOB). 25 tablet 6  . predniSONE (DELTASONE) 20 MG tablet Take 1 tablet (20 mg total) by mouth daily with breakfast. (Patient not taking: Reported on 03/28/2014) 7 tablet 0   No facility-administered medications prior to visit.    No Known Allergies  ROS As per HPI  PE: Blood pressure 139/85, pulse 57, temperature 97.8 F (36.6 C), temperature source Temporal, resp. rate 18, height 5\' 10"  (1.778 m), weight 245 lb (111.131 kg), SpO2 96 %. Gen: Alert, well appearing.   Patient is oriented to person, place, time, and situation. Sits leaning over a little bit, leaning a bit towards the left CV: RRR, no m/r/g.   LUNGS: CTA bilat, nonlabored resps, good aeration in all lung fields. Chest wall: no tenderness or rash. He has a small area of mild tenderness in RUQ of abdomen and right flank inferior to the lower ribs, but he repeatedly says "it feels deep".  His tenderness resolved with lying supine.   ABD: soft, w/out distention. BS normal, without mass, HSM, or bruit.  LABS:  None today (patient unable to produce a urine sample)  IMPRESSION AND PLAN:  Right flank pain From an exam standpoint this seems to be musculoskeletal in origin, but there is no history of trauma or strain (he states he had lifted a "small box" a couple of days before feeling the onset of his pain, but it was nothing that he felt a strain after).   No skin changes suggestive of shingles. Would like to have UA but pt unable to urinate for me today. Will check noncontrast CT to r/o stone in renal pelvis. Check CBC w/diff and CMET today as well. I do not find anything to support any lung or heart abnormality as the etiology of this pain.    An After Visit Summary was printed and given to the patient.  FOLLOW UP: Return for to be determined based on w/u.

## 2014-03-28 NOTE — Telephone Encounter (Signed)
SW patient, he is okay with going to a sports medicine doctor or orthopedist

## 2014-03-28 NOTE — Progress Notes (Signed)
Pre visit review using our clinic review tool, if applicable. No additional management support is needed unless otherwise documented below in the visit note. 

## 2014-03-28 NOTE — Telephone Encounter (Signed)
Patient would also like Oxycodone for short term pain.

## 2014-03-28 NOTE — Telephone Encounter (Signed)
Oxycodone rx printed. Referral to Dr. Tamala Julian in Sports [facility] ordered.

## 2014-03-29 ENCOUNTER — Other Ambulatory Visit: Payer: Self-pay | Admitting: *Deleted

## 2014-03-29 MED ORDER — METOPROLOL TARTRATE 25 MG PO TABS: 12.5000 mg | ORAL_TABLET | Freq: Two times a day (BID) | ORAL | Status: DC

## 2014-03-31 ENCOUNTER — Encounter: Payer: Self-pay | Admitting: Family Medicine

## 2014-04-02 NOTE — Telephone Encounter (Signed)
LM for patient to CB to schedule appt with Dr. Tamala Julian

## 2014-04-03 ENCOUNTER — Other Ambulatory Visit: Payer: Self-pay | Admitting: Family Medicine

## 2014-04-03 MED ORDER — METOPROLOL TARTRATE 25 MG PO TABS: 12.5000 mg | ORAL_TABLET | Freq: Two times a day (BID) | ORAL | Status: DC

## 2014-04-05 ENCOUNTER — Other Ambulatory Visit: Payer: Self-pay | Admitting: Nurse Practitioner

## 2014-05-16 ENCOUNTER — Other Ambulatory Visit: Payer: Self-pay | Admitting: Neurosurgery

## 2014-05-16 DIAGNOSIS — M48061 Spinal stenosis, lumbar region without neurogenic claudication: Secondary | ICD-10-CM

## 2014-06-05 ENCOUNTER — Encounter: Payer: Self-pay | Admitting: Radiology

## 2014-06-05 ENCOUNTER — Ambulatory Visit
Admission: RE | Admit: 2014-06-05 | Discharge: 2014-06-05 | Disposition: A | Discharge status: Home / Self Care | Payer: Medicare HMO | Source: Ambulatory Visit | Attending: Neurosurgery | Admitting: Neurosurgery

## 2014-06-05 ENCOUNTER — Other Ambulatory Visit: Payer: Self-pay | Admitting: Radiology

## 2014-06-05 VITALS — BP 107/60 | HR 56

## 2014-06-05 DIAGNOSIS — M48061 Spinal stenosis, lumbar region without neurogenic claudication: Secondary | ICD-10-CM

## 2014-06-05 DIAGNOSIS — Z9889 Other specified postprocedural states: Secondary | ICD-10-CM

## 2014-06-05 MED ORDER — DIAZEPAM 5 MG PO TABS
10.0000 mg | ORAL_TABLET | Freq: Once | ORAL | Status: AC
  Administered 2014-06-05: 10 mg via ORAL

## 2014-06-05 MED ORDER — ONDANSETRON HCL 4 MG/2ML IJ SOLN
4.0000 mg | Freq: Once | INTRAMUSCULAR | Status: AC
  Administered 2014-06-05: 4 mg via INTRAMUSCULAR

## 2014-06-05 MED ORDER — MEPERIDINE HCL 100 MG/ML IJ SOLN
100.0000 mg | Freq: Once | INTRAMUSCULAR | Status: AC
  Administered 2014-06-05: 100 mg via INTRAMUSCULAR

## 2014-06-05 MED ORDER — IOHEXOL 180 MG/ML  SOLN
20.0000 mL | Freq: Once | INTRAMUSCULAR | Status: AC | PRN
  Administered 2014-06-05: 20 mL via INTRATHECAL

## 2014-06-05 NOTE — Discharge Instructions (Signed)

## 2014-06-20 ENCOUNTER — Telehealth: Payer: Self-pay | Admitting: *Deleted

## 2014-06-20 MED ORDER — LORAZEPAM 0.5 MG PO TABS: ORAL_TABLET | ORAL | Status: DC

## 2014-06-20 NOTE — Telephone Encounter (Signed)
Lorazepam rx printed.  Pls fax to pt's pharmacy and notify pt.-thx

## 2014-06-20 NOTE — Telephone Encounter (Signed)
Pt LMOM request something for his nerves. He states that his wife is going to be having open heart surgery next week and he is very nervous about this. Please advised. Thanks.

## 2014-06-21 NOTE — Telephone Encounter (Signed)
Rx faxed to [place] per pt request.

## 2014-08-12 ENCOUNTER — Encounter: Payer: Self-pay | Admitting: Family Medicine

## 2014-08-13 ENCOUNTER — Ambulatory Visit: Payer: Medicare Other | Admitting: Nurse Practitioner

## 2014-08-15 ENCOUNTER — Ambulatory Visit (INDEPENDENT_AMBULATORY_CARE_PROVIDER_SITE_OTHER): Payer: Medicare HMO | Admitting: Nurse Practitioner

## 2014-08-15 ENCOUNTER — Encounter: Payer: Self-pay | Admitting: Nurse Practitioner

## 2014-08-15 VITALS — BP 122/83 | HR 49 | Ht 71.0 in | Wt 244.4 lb

## 2014-08-15 DIAGNOSIS — Z9889 Other specified postprocedural states: Secondary | ICD-10-CM | POA: Diagnosis not present

## 2014-08-15 DIAGNOSIS — R413 Other amnesia: Secondary | ICD-10-CM

## 2014-08-15 DIAGNOSIS — I6789 Other cerebrovascular disease: Principal | ICD-10-CM

## 2014-08-15 DIAGNOSIS — R071 Chest pain on breathing: Secondary | ICD-10-CM

## 2014-08-15 DIAGNOSIS — G40909 Epilepsy, unspecified, not intractable, without status epilepticus: Secondary | ICD-10-CM | POA: Diagnosis not present

## 2014-08-15 DIAGNOSIS — R0789 Other chest pain: Secondary | ICD-10-CM

## 2014-08-15 MED ORDER — OXCARBAZEPINE 600 MG PO TABS: 600.0000 mg | ORAL_TABLET | Freq: Three times a day (TID) | ORAL | Status: DC

## 2014-08-15 MED ORDER — CYCLOBENZAPRINE HCL 10 MG PO TABS: 10.0000 mg | ORAL_TABLET | Freq: Every day | ORAL | Status: DC

## 2014-08-15 NOTE — Patient Instructions (Addendum)
Continue Trileptal at current dose will refill Try Flexeril for neck pain and headache at night Reviewed recent labs CBC CMP within normal limits Call for any seizure activity Follow-up yearly and when necessary

## 2014-08-15 NOTE — Progress Notes (Signed)
[facility]  PATIENT: Peter Barnett DOB: 1960-11-06   REASON FOR VISIT: Follow-up for seizure disorder, new complaint of headache and neck pain  HISTORY FROM: Patient and wife    HISTORY OF PRESENT ILLNESS:Peter Barnett is a 54 years old right-handed Caucasian male, accompanied by his wife of 13 years, he was patient's of Dr. Erling Cruz, last clinical visit was in February 2014, he has been followed by our clinic for partial seizure,  He suffered complex partial seizure, in 1981, was diagnosed with right frontal AV malformation, had a right frontal craniotomy by Dr. Lesly Dukes at [facility] in 1981, he seizure has been under good control, but he continued to have occasional staring spells, which was considered due to simple partial or complex partial seizure, over the years, he has tried different medications, Dilantin caused gingiva hypertrophy, Keppra cause mood disorder, Topamax complains of memory trouble,  He has 3 staring spell in 6 months per wife, no body shaking movement. This morning, he woke up noticed his left leg was jumping, there was no post event confusion, this is the first time, he experienced similar complaints, reported previous abnormal EEG  He has been taking oxcarbazepine, currently taking 600 mg 3 times during the daytime, half tablets at nighttime, most recent laboratory evaluation was in April 10 2012, 23, normal range was 10-35,  He could not have MRI of the brain because mental plate at right frontal craniotomy site, he was also seen by Dr. Joya Salm for his chronic low back pain, left lumbar radiculopathy, he continued to complains of shooting pain from his left lower back to his left leg, worsening by prolonged walking, he had 2 lumbar surgery, most recent one was in 2010, this was evaluated by lumbar myelogram. He is receiving some epidural injection which has been beneficial  He had 10 years of education, quit high school because he never was good  at school, and did not like school, works at [employer] job all his life, went on disability since 1981 after his right frontal craniotomy, he stays at home, doing household chores, patient stated he never had good memory all his life, wife noticed worsening memory over the past few years, especially over the past 1 year, he forgot why he went to kitchen, he could not get all the items on the list for grocery shopping, after shower, he could not remember whether he has shampooed his hair or not, but he is still driving, not getting lost, but need direction from his wife, wife continued to notice occasionally staring spells, about 3 spells in 6 months, he staring off into space, there was no body jerking movement. There was no family history of memory loss  UPDATE: 08/15/2014 Peter Barnett, 54 year old male returns for yearly follow-up. He has history of seizure disorder with no seizures in several years  EEG done 2014 was read as normal. He denies any side effects of Trileptal. Labs to rule out organic causes of memory loss were negative. Has history of craniotomy in 1981. He has been on disability for many years. He has a new complaint today of headache for about 3 weeks in the left occipital area, there is no nausea or vomiting, no photophobia or phonophobia. He also has  neck pain. He has been taken Tylenol with some relief. He is due to have back surgery by Dr. Joya Salm in September. He has significant lumbar spinal stenosis according to the patient. Recent labs at primary care within normal limits,  CBC CMP. He returns for reevaluation   REVIEW OF SYSTEMS: Full 14 system review of systems performed and notable only for those listed, all others are neg:  Constitutional: neg  Cardiovascular: neg Ear/Nose/Throat: neg  Skin: neg Eyes: neg Respiratory: neg Gastroitestinal: neg  Hematology/Lymphatic: neg  Endocrine: neg Musculoskeletal: Back pain, neck pain Allergy/Immunology: neg Neurological: Memory loss,  headache Psychiatric: neg Sleep : neg   ALLERGIES: No Known Allergies  HOME MEDICATIONS: Outpatient Prescriptions Prior to Visit  Medication Sig Dispense Refill  . aspirin 325 MG tablet Take 325 mg by mouth daily.    . Coenzyme Q10 (COQ10) 50 MG CAPS Take 50 mg by mouth daily. 90 capsule 2  . cyclobenzaprine (FLEXERIL) 10 MG tablet 1/2 to 1 tablet twice daily PRN muscle spasm 20 tablet 0  . docusate sodium (COLACE) 250 MG capsule Take 250 mg by mouth daily.    Marland Kitchen LORazepam (ATIVAN) 0.5 MG tablet 1-2 tabs po bid prn anxiety 20 tablet 0  . metoprolol tartrate (LOPRESSOR) 25 MG tablet Take 0.5 tablets (12.5 mg total) by mouth 2 (two) times daily. 30 tablet 3  . oxcarbazepine (TRILEPTAL) 600 MG tablet Take 1 tablet (600 mg total) by mouth 3 (three) times daily. And one half at bedtime (Patient taking differently: Take 600 mg by mouth 3 (three) times daily. And one half at bedtime.  Pt stated he is not taking the 1/2 tab at bedtime.  08-15-14) 105 tablet 11  . simvastatin (ZOCOR) 40 MG tablet Take 40 mg by mouth every evening.    . cholecalciferol (VITAMIN D) 1000 UNITS tablet Take 1,000 Units by mouth daily.    Marland Kitchen HYDROcodone-acetaminophen (LORTAB) 10-500 MG per tablet Take 1 tablet by mouth every 6 (six) hours as needed. For pain    . oxyCODONE (OXY IR/ROXICODONE) 5 MG immediate release tablet 1-2 tabs po q6h prn pain 40 tablet 0  . nitroGLYCERIN (NITROSTAT) 0.4 MG SL tablet Place 1 tablet (0.4 mg total) under the tongue every 5 (five) minutes as needed for chest pain (CP or SOB). 25 tablet 6   No facility-administered medications prior to visit.    PAST MEDICAL HISTORY: Past Medical History  Diagnosis Date  . HTN (hypertension)   . CAD (coronary artery disease)     Premature; stents + known distal RCA/PDA occlusion  . MI (myocardial infarction) 2009    "just had arm pain; never any chest pain"  . Exertional dyspnea 10/07/2011  . Short-term memory loss 10/07/2011    "post brain OR & from  his seizures"  . Headache(784.0) 10/07/2011     a couple per month  . Pneumonia ~ 2010    "2 years in a row; May both times"  . Petit-mal epilepsy 1981-present  . Chronic lower back pain     Still with recurrent left sided LBP with paresthesias down left leg --primarily with activities: pain pill use is avg <90 tabs per mo  . Urethral stricture since 1981    Recurrent dilation has been required [facility]rology)  . History of adenomatous polyp of colon 02/2009;02/2014    Recall 5 yrs ([facility])  . COPD (chronic obstructive pulmonary disease)     NOTED on CT chest 11/2013 done for lung ca screening  . Hyperlipidemia   . Nephrolithiasis 03/2014    Bilateral (76mm) nonobstructing    PAST SURGICAL HISTORY: Past Surgical History  Procedure Laterality Date  . Craniotomy for avm  1981    Dx'd after pt began having  seizures.  . Tonsillectomy      "I was little"  . Lumbar disc surgery  2008; 2009    Dr. Joya Salm  . Coronary angioplasty with stent placement  2009    3 DES to LCx; still with known distal RCA/PDA occlusion.  EF 60% by LV-gram.  . Colonoscopy w/ polypectomy  02/2009;02/2014    Recall 5 yr (aden poly, hyperplast polyp, int and ext hemorr).  Next recall is 02/2019.  . Cardiac catheterization  2011    [place], records unavailable: pt reports "normal".    FAMILY HISTORY: Family History  Problem Relation Age of Onset  . Hyperlipidemia Mother   . Cancer Father     SOCIAL HISTORY: History   Social History  . Marital Status: Married    Spouse Name: [name]  . Number of Children: 2  . Years of Education: 10 th   Occupational History  .      retired   Social History Main Topics  . Smoking status: Former Smoker -- 3.00 packs/day for 27 years    Types: Cigarettes    Quit date: 02/08/2001  . Smokeless tobacco: Never Used  . Alcohol Use: 0.6 oz/week    1 Cans of beer per week  . Drug Use: No  . Sexual Activity: Yes   Other Topics Concern  . Not on  file   Social History Narrative   Patient lives at home with his wife [name]).   Two adult children.   Retired from [employer] approx age 49 due to medical reasons (disabled due to memory issues, seizure d/o and chronic LBP).   Education 10th grade.   Right handed.   Caffeine two cups of tea and two cups of coffee.   Tobacco 45 pack-yr hx, quit approx age 55.  Alcohol: 1-2 beers per night.     No hx of alc or drug problems.   No exercise.   Diet: regular     PHYSICAL EXAM  Filed Vitals:   08/15/14 1037  BP: 122/83  Pulse: 49  Height: 5\' 11"  (1.803 m)  Weight: 244 lb 6.4 oz (110.859 kg)   Body mass index is 34.1 kg/(m^2). Generalized: In no acute distress  Neck: Supple, no carotid bruits  Cardiac: Regular rate rhythm  Pulmonary: Clear to auscultation bilaterally  Musculoskeletal: No deformity  Neurological examination  Mentation: Alert oriented to time, place, history taking, and causual conversation,.  Cranial nerve II-XII: Pupils were equal round reactive to light extraocular movements were full, visual field were full on confrontational test. facial sensation and strength were normal. hearing was intact to finger rubbing bilaterally. Uvula tongue midline. head turning and shoulder shrug and were normal and symmetric.Tongue protrusion into cheek strength was normal.  Motor: normal tone, bulk and strength. No focal weakness Sensory: Intact to fine touch, pinprick, preserved vibratory sensation, and proprioception at toes.  Coordination: Normal finger to nose, heel-to-shin bilaterally  Gait: Rising up from seated position without assistance, normal stance, without trunk ataxia, moderate stride, good arm swing, smooth turning, no assistive device Romberg signs: Negative  Deep tendon reflexes: Symmetric upper and lower, plantar responses were flexor bilaterally.    DIAGNOSTIC DATA (LABS, IMAGING, TESTING) - I reviewed patient records, labs, notes, testing and imaging  myself where available.  Lab Results  Component Value Date   WBC 10.3 03/28/2014   HGB 15.6 03/28/2014   HCT 46.6 03/28/2014   MCV 88.8 03/28/2014   PLT 228.0 03/28/2014      Component Value Date/Time  NA 137 03/28/2014 0834   NA 134 08/09/2013 1127   K 4.0 03/28/2014 0834   CL 103 03/28/2014 0834   CO2 27 03/28/2014 0834   GLUCOSE 83 03/28/2014 0834   GLUCOSE 112* 08/09/2013 1127   BUN 12 03/28/2014 0834   BUN 10 08/09/2013 1127   CREATININE 0.92 03/28/2014 0834   CALCIUM 9.5 03/28/2014 0834   PROT 6.6 03/28/2014 0834   PROT 6.5 08/09/2013 1127   ALBUMIN 4.2 03/28/2014 0834   AST 18 03/28/2014 0834   ALT 32 03/28/2014 0834   ALKPHOS 78 03/28/2014 0834   BILITOT 0.5 03/28/2014 0834   GFRNONAA 105 08/09/2013 1127   GFRAA 121 08/09/2013 1127     ASSESSMENT AND PLAN  54 y.o. year old male  has a past medical history of right AVM, status post post right craniotomy history of complex partial seizures and memory trouble. Labs to evaluate memory loss in the past were normal. No seizure activity in several years.New complaint of headache and neck pain. The patient is a current patient of Dr. Krista Blue  who is out of the office today . This note is sent to the work in doctor.     Continue Trileptal at current dose will refill Try Flexeril for neck pain and headache at night Reviewed recent labs CBC CMP within normal limits Call for any seizure activity Follow-up yearly and when necessary Dennie Bible, [facility], [facility], [facility] [address], [place], [place] [address] [phone]

## 2014-08-16 NOTE — Progress Notes (Signed)
I agree with the above plan 

## 2014-09-04 ENCOUNTER — Other Ambulatory Visit: Payer: Self-pay | Admitting: Family Medicine

## 2014-09-04 NOTE — Telephone Encounter (Signed)
RF request for metoprolol LOV: 12/05/13 Next ov: None Last written: 04/03/14 #30 w/ 3RF

## 2014-10-21 ENCOUNTER — Ambulatory Visit: Payer: Medicare HMO | Admitting: Interventional Cardiology

## 2014-11-28 ENCOUNTER — Encounter: Payer: Self-pay | Admitting: Family Medicine

## 2014-11-28 ENCOUNTER — Ambulatory Visit (INDEPENDENT_AMBULATORY_CARE_PROVIDER_SITE_OTHER): Payer: Medicare HMO | Admitting: Family Medicine

## 2014-11-28 ENCOUNTER — Ambulatory Visit (INDEPENDENT_AMBULATORY_CARE_PROVIDER_SITE_OTHER): Payer: Medicare HMO

## 2014-11-28 VITALS — BP 137/85 | HR 55 | Temp 98.3°F | Resp 20 | Ht 71.0 in | Wt 243.0 lb

## 2014-11-28 DIAGNOSIS — R509 Fever, unspecified: Secondary | ICD-10-CM | POA: Diagnosis not present

## 2014-11-28 DIAGNOSIS — R059 Cough, unspecified: Secondary | ICD-10-CM

## 2014-11-28 DIAGNOSIS — R05 Cough: Secondary | ICD-10-CM

## 2014-11-28 MED ORDER — LEVOFLOXACIN 500 MG PO TABS: 500.0000 mg | ORAL_TABLET | Freq: Every day | ORAL | Status: DC

## 2014-11-28 MED ORDER — HYDROCODONE-HOMATROPINE 5-1.5 MG/5ML PO SYRP: 5.0000 mL | ORAL_SOLUTION | Freq: Three times a day (TID) | ORAL | Status: DC | PRN

## 2014-11-28 MED ORDER — BENZONATATE 200 MG PO CAPS: 200.0000 mg | ORAL_CAPSULE | Freq: Two times a day (BID) | ORAL | Status: DC | PRN

## 2014-11-28 NOTE — Patient Instructions (Signed)

## 2014-11-28 NOTE — Progress Notes (Signed)
Subjective:    Patient ID: Peter Barnett, male    DOB: 08-14-1960, 54 y.o.   MRN: [id number]  HPI  Cough with fever: Patient presents for an acute visit for cough with a fever of MAXIMUM TEMPERATURE 100.7. He states the last few nights he has had a fever. He has noticed the symptoms started approximately 5 days ago with a productive cough and fatigue. Patient states he has had pneumonia a few times over the last few years and this feels similar. He has a achy sensation in bilateral lower lungs. He denies nausea, vomit, diarrhea or rash. He denies sick contacts. He states he is eating and drinking well, denies myalgias or sore throat. Does admit to nasal congestion or rhinorrhea. He has tried cough drops and has been taking Tylenol for his fever. Patient does not have an asthma history. He is a former smoker, and states he has been diagnosed with COPD.  Past Medical History  Diagnosis Date  . HTN (hypertension)   . CAD (coronary artery disease)     Premature; stents + known distal RCA/PDA occlusion  . MI (myocardial infarction) ([place]) 2009    "just had arm pain; never any chest pain"  . Exertional dyspnea 10/07/2011  . 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 Headache(784.0) 10/07/2011     a couple per month  . Pneumonia ~ 2010    "2 years in a row; May both times"  . Petit-mal epilepsy ([place]) 1981-present  . Chronic lower back pain     Still with recurrent left sided LBP with paresthesias down left leg --primarily with activities: pain pill use is avg <90 tabs per mo  . Urethral stricture since 1981    Recurrent dilation has been required [facility] urology)  . History of adenomatous polyp of colon 02/2009;02/2014    Recall 5 yrs ([facility])  . COPD (chronic obstructive pulmonary disease) ([place])     NOTED on CT chest 11/2013 done for lung ca screening  . Hyperlipidemia   . Nephrolithiasis 03/2014    Bilateral (59mm) nonobstructing    Family History  Problem Relation Age of Onset  . Hyperlipidemia Mother   . Cancer Father    Social History   Social History  . Marital Status: Married    Spouse Name: [name]  . Number of Children: 2  . Years of Education: 10 th   Occupational History  .      retired   Social History Main Topics  . Smoking status: Former Smoker -- 3.00 packs/day for 27 years    Types: Cigarettes    Quit date: 02/08/2001  . Smokeless tobacco: Never Used  . Alcohol Use: 0.6 oz/week    1 Cans of beer per week  . Drug Use: No  . Sexual Activity: Yes   Other Topics Concern  . Not on file   Social History Narrative   Patient lives at home with his wife [name]).   Two adult children.   Retired from [employer] approx age 76 due to medical reasons (disabled due to memory issues, seizure d/o and chronic LBP).   Education 10th grade.   Right handed.   Caffeine two cups of tea and two cups of coffee.   Tobacco 45 pack-yr hx, quit approx age 79.  Alcohol: 1-2 beers per night.     No hx of alc or drug problems.   No exercise.   Diet: regular  Review of Systems Negative, with the exception of above mentioned in HPI     Objective:   Physical Exam BP 137/85 mmHg  Pulse 55  Temp(Src) 98.3 F (36.8 C) (Oral)  Resp 20  Ht 5\' 11"  (1.803 m)  Wt 243 lb (110.224 kg)  BMI 33.91 kg/m2  SpO2 95% Gen: Afebrile. No acute distress. Fatigued. Pleasant male.  HENT: AT. [place]. Bilateral TM visualized and normal in appearance. MMM. Bilateral nares with erythema and no swelling Throat without erythema or exudates. Mild maxillary tenderness. Cough present on exam. Mild hoarseness on exam.  Eyes:Pupils Equal Round Reactive to light, Extraocular movements intact,  Conjunctiva without redness, discharge or icterus. Neck/lymp/endocrine: Supple,right anterior cervical lymphadenopathy CV: RRR  Chest: Crackle LLL, otherwise no wheezing or rhonchi Abd: Soft. obese. NTND. BS present. No Masses palpated.  Skin: No  rashes, purpura or petechiae.     Assessment & Plan:  1. Cough/fever- Will treat as early PNA secondary to exam. Obtain imaging. Pt encouraged to rest, hydrate, abx and advil/tylenol for fever.  - benzonatate (TESSALON) 200 MG capsule; Take 1 capsule (200 mg total) by mouth 2 (two) times daily as needed for cough.  Dispense: 20 capsule; Refill: 0 - HYDROcodone-homatropine (HYCODAN) 5-1.5 MG/5ML syrup; Take 5 mLs by mouth every 8 (eight) hours as needed for cough.  Dispense: 120 mL; Refill: 0 - DG Chest 2 View; Future - levofloxacin (LEVAQUIN) 500 MG tablet; Take 1 tablet (500 mg total) by mouth daily.  Dispense: 7 tablet; Refill: 0  F/U PRN

## 2014-11-29 ENCOUNTER — Encounter: Payer: Self-pay | Admitting: Family Medicine

## 2014-11-29 ENCOUNTER — Telehealth: Payer: Self-pay | Admitting: Family Medicine

## 2014-11-29 DIAGNOSIS — R509 Fever, unspecified: Secondary | ICD-10-CM | POA: Insufficient documentation

## 2014-11-29 MED ORDER — AZITHROMYCIN 250 MG PO TABS: ORAL_TABLET | ORAL | Status: DC

## 2014-11-29 NOTE — Telephone Encounter (Addendum)
Please call pt: - His chest xray did not show obvious signs of pneumonia. I would like to treat him with the abx, as I feel he may have an early pneumonia from our exam. I had called this medication in to his pharmacy yesterday afternoon.

## 2014-11-29 NOTE — Telephone Encounter (Signed)
Patient aware of results and medication at pharmacy.  Pt already picked up Rx. Pt had no questions at this time.

## 2014-12-05 ENCOUNTER — Ambulatory Visit (INDEPENDENT_AMBULATORY_CARE_PROVIDER_SITE_OTHER): Payer: Medicare HMO | Admitting: Interventional Cardiology

## 2014-12-05 ENCOUNTER — Encounter: Payer: Self-pay | Admitting: Interventional Cardiology

## 2014-12-05 VITALS — Ht 71.0 in | Wt 238.0 lb

## 2014-12-05 DIAGNOSIS — I2510 Atherosclerotic heart disease of native coronary artery without angina pectoris: Secondary | ICD-10-CM | POA: Diagnosis not present

## 2014-12-05 DIAGNOSIS — R0609 Other forms of dyspnea: Secondary | ICD-10-CM

## 2014-12-05 DIAGNOSIS — E785 Hyperlipidemia, unspecified: Secondary | ICD-10-CM | POA: Diagnosis not present

## 2014-12-05 DIAGNOSIS — R0600 Dyspnea, unspecified: Secondary | ICD-10-CM | POA: Insufficient documentation

## 2014-12-05 DIAGNOSIS — R0602 Shortness of breath: Secondary | ICD-10-CM | POA: Diagnosis not present

## 2014-12-05 LAB — BASIC METABOLIC PANEL
BUN: 12 mg/dL (ref 7–25)
CALCIUM: 9.9 mg/dL (ref 8.6–10.3)
CHLORIDE: 102 mmol/L (ref 98–110)
CO2: 20 mmol/L (ref 20–31)
Creat: 0.91 mg/dL (ref 0.70–1.33)
Glucose, Bld: 90 mg/dL (ref 65–99)
Potassium: 3.9 mmol/L (ref 3.5–5.3)
Sodium: 132 mmol/L — ABNORMAL LOW (ref 135–146)

## 2014-12-05 LAB — BRAIN NATRIURETIC PEPTIDE: Brain Natriuretic Peptide: 6.4 pg/mL (ref 0.0–100.0)

## 2014-12-05 MED ORDER — ASPIRIN 81 MG PO TABS: 81.0000 mg | ORAL_TABLET | Freq: Every day | ORAL | Status: DC

## 2014-12-05 MED ORDER — NITROGLYCERIN 0.4 MG SL SUBL: 0.4000 mg | SUBLINGUAL_TABLET | SUBLINGUAL | Status: DC | PRN

## 2014-12-05 NOTE — Addendum Note (Signed)
Addended by: Dennie Fetters on: 12/05/2014 11:02 AM   Modules accepted: Orders

## 2014-12-05 NOTE — Patient Instructions (Signed)
**Note De-Identified Danniela Mcbrearty Obfuscation** Medication Instructions:  Same-no changes  Labwork: BMET and BNP today  Testing/Procedures: None  Follow-Up: Your physician wants you to follow-up in: 1 year. You will receive a reminder letter in the mail two months in advance. If you don't receive a letter, please call our office to schedule the follow-up appointment.      If you need a refill on your cardiac medications before your next appointment, please call your pharmacy.

## 2014-12-05 NOTE — Progress Notes (Signed)
Patient ID: Peter Barnett, male   DOB: [birth year], 54 y.o.   MRN: [id number]     Cardiology Office Note   Date:  12/05/2014   ID:  Kathyrn Drown Schweers, DOB 17-Jun-1960, MRN [id number]  PCP:  Tammi Sou, MD    No chief complaint on file. CAD   Wt Readings from Last 3 Encounters:  12/05/14 238 lb (107.956 kg)  11/28/14 243 lb (110.224 kg)  08/15/14 244 lb 6.4 oz (110.859 kg)       History of Present Illness: Peter Barnett is a 54 y.o. male  with premature atherosclerosis. He has had several DES.  Chest Pain:  Mild DOE with exercise. Limited by back pain.  c/o Chest pain.  c/o Leg edema improving.  Denies : Palpitations Dizziness.  Trouble with meds.  Cough.  Heartburn.    Cardiac rehabilitation was most strenuous activity. He has fatigue. He reports some shortness of breath. He recently was diagnosed with pneumonia. He has completed a course of antibiotics. He is lost a few pounds in the last week with a pneumonia. He had shortness of breath even prior to the pneumonia. No anginal chest discomfort. He has spinal stenosis. He has some bilateral ankle swelling. He may be having surgery for his spinal stenosis.   His wife had a valve replacement and thoracic aneurysm repair several weeks ago complicated by atrial fibrillation.    Past Medical History  Diagnosis Date  . HTN (hypertension)   . CAD (coronary artery disease)     Premature; stents + known distal RCA/PDA occlusion  . MI (myocardial infarction) ([place]) 2009    "just had arm pain; never any chest pain"  . Exertional dyspnea 10/07/2011  . 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 Headache(784.0) 10/07/2011     a couple per month  . Pneumonia ~ 2010    "2 years in a row; May both times"  . Petit-mal epilepsy ([place]) 1981-present  . Chronic lower back pain     Still with recurrent left sided LBP with paresthesias down left leg --primarily with activities: pain pill use is avg <90  tabs per mo  . Urethral stricture since 1981    Recurrent dilation has been required [facility] urology)  . History of adenomatous polyp of colon 02/2009;02/2014    Recall 5 yrs ([facility])  . COPD (chronic obstructive pulmonary disease) ([place])     NOTED on CT chest 11/2013 done for lung ca screening  . Hyperlipidemia   . Nephrolithiasis 03/2014    Bilateral (68mm) nonobstructing    Past Surgical History  Procedure Laterality Date  . Craniotomy for avm  1981    Dx'd after pt began having seizures.  . Tonsillectomy      "I was little"  . Lumbar disc surgery  2008; 2009    Dr. Joya Salm  . Coronary angioplasty with stent placement  2009    3 DES to LCx; still with known distal RCA/PDA occlusion.  EF 60% by LV-gram.  . Colonoscopy w/ polypectomy  02/2009;02/2014    Recall 5 yr (aden poly, hyperplast polyp, int and ext hemorr).  Next recall is 02/2019.  . Cardiac catheterization  2011    [facility], records unavailable: pt reports "normal".     Current Outpatient Prescriptions  Medication Sig Dispense Refill  . aspirin 325 MG tablet Take 325 mg by mouth daily.    . benzonatate (TESSALON) 200 MG capsule Take 1  capsule (200 mg total) by mouth 2 (two) times daily as needed for cough. 20 capsule 0  . Cholecalciferol (PA VITAMIN D-3) 2000 UNITS CAPS Take by mouth daily.     Marland Kitchen docusate sodium (COLACE) 250 MG capsule Take 250 mg by mouth daily.    . famotidine (PEPCID) 20 MG tablet Take 20 mg by mouth daily.    Marland Kitchen HYDROcodone-acetaminophen (NORCO) 10-325 MG per tablet Take 1 tablet by mouth every 6 (six) hours as needed (PAIN).     Marland Kitchen metoprolol tartrate (LOPRESSOR) 25 MG tablet TAKE 1/2 TABLET BY MOUTH TWICE DAILY 30 tablet 3  . nitroGLYCERIN (NITROSTAT) 0.4 MG SL tablet Place 1 tablet (0.4 mg total) under the tongue every 5 (five) minutes as needed for chest pain (CP or SOB). 25 tablet 3  . oxcarbazepine (TRILEPTAL) 600 MG tablet Take 1 tablet (600 mg total) by mouth 3 (three) times  daily. 90 tablet 11  . simvastatin (ZOCOR) 40 MG tablet Take 40 mg by mouth every evening.     No current facility-administered medications for this visit.    Allergies:   Review of patient's allergies indicates no known allergies.    Social History:  The patient  reports that he quit smoking about 13 years ago. His smoking use included Cigarettes. He has a 81 pack-year smoking history. He has never used smokeless tobacco. He reports that he drinks about 0.6 oz of alcohol per week. He reports that he does not use illicit drugs.   Family History:  The patient's family history includes Cancer in his father; Hyperlipidemia in his mother; Hypertension in his father. There is no history of Heart attack or Stroke.    ROS:  Please see the history of present illness.   Otherwise, review of systems are positive for back pain.   All other systems are reviewed and negative.    PHYSICAL EXAM: VS:  Ht 5\' 11"  (1.803 m)  Wt 238 lb (107.956 kg)  BMI 33.21 kg/m2 , BMI Body mass index is 33.21 kg/(m^2). GEN: Well nourished, well developed, in no acute distress HEENT: normal Neck: no JVD, carotid bruits, or masses Cardiac: RRR; no murmurs, rubs, or gallops,no edema  Respiratory:  clear to auscultation bilaterally, normal work of breathing GI: soft, nontender, nondistended, + BS MS: no deformity or atrophy Skin: warm and dry, no rash Neuro:  Strength and sensation are intact Psych: euthymic mood, full affect   EKG:   The ekg ordered 2/16 demonstrates no ischemic changes    Recent Labs: 03/28/2014: ALT 32; BUN 12; Creatinine, Ser 0.92; Hemoglobin 15.6; Platelets 228.0; Potassium 4.0; Sodium 137   Lipid Panel    Component Value Date/Time   CHOL 158 10/31/2013 0943   TRIG 128.0 10/31/2013 0943   HDL 37.80* 10/31/2013 0943   CHOLHDL 4 10/31/2013 0943   VLDL 25.6 10/31/2013 0943   LDLCALC 95 10/31/2013 0943     Other studies Reviewed: Additional studies/ records that were reviewed today  with results demonstrating: Cath report from 2009 reviewed .   ASSESSMENT AND PLAN:  1. CAD/Old MI: Continue aggressive secondary prevention.  Okay to decrease aspirin to 81 mg daily.  No angina at this time. If he does require back surgery, this would be a good time to do it. He is not requiring clopidogrel. 2. Dyspnea on exertion: Check for fluid overload given the lower extremity edema. Check BNP and be met. 3. Hyperlipidemia: Was controlled last year. He has labs coming up with Dr. 2010.  Current medicines are reviewed at length with the patient today.  The patient concerns regarding his medicines were addressed.  The following changes have been made:  Decrease aspirin 81 mg daily  Labs/ tests ordered today include:   Orders Placed This Encounter  Procedures  . Basic Metabolic Panel (BMET)  . B Nat Peptide    Recommend 150 minutes/week of aerobic exercise Low fat, low carb, high fiber diet recommended  Disposition:   FU in  1 year   Teresita Madura., MD  12/05/2014 10:56 AM    [facility] [place], [place], [place]  [address] Phone: [phone]; Fax: [phone]

## 2014-12-06 ENCOUNTER — Telehealth: Payer: Self-pay | Admitting: Interventional Cardiology

## 2014-12-06 NOTE — Telephone Encounter (Signed)
F/u  Pt returning phone call- labs; please call back and discuss.

## 2014-12-06 NOTE — Telephone Encounter (Signed)
Informed pt of lab results. Pt verbalized understanding. 

## 2014-12-07 ENCOUNTER — Observation Stay (HOSPITAL_COMMUNITY)
Admission: EM | Admit: 2014-12-07 | Discharge: 2014-12-08 | Disposition: A | Discharge status: Home / Self Care | Payer: Medicare HMO | Attending: Internal Medicine | Admitting: Internal Medicine

## 2014-12-07 ENCOUNTER — Encounter (HOSPITAL_COMMUNITY): Payer: Self-pay | Admitting: Neurology

## 2014-12-07 ENCOUNTER — Emergency Department (HOSPITAL_COMMUNITY): Payer: Medicare HMO

## 2014-12-07 DIAGNOSIS — Z955 Presence of coronary angioplasty implant and graft: Secondary | ICD-10-CM | POA: Insufficient documentation

## 2014-12-07 DIAGNOSIS — Z23 Encounter for immunization: Secondary | ICD-10-CM | POA: Insufficient documentation

## 2014-12-07 DIAGNOSIS — E785 Hyperlipidemia, unspecified: Secondary | ICD-10-CM | POA: Diagnosis not present

## 2014-12-07 DIAGNOSIS — I10 Essential (primary) hypertension: Secondary | ICD-10-CM | POA: Diagnosis not present

## 2014-12-07 DIAGNOSIS — I2510 Atherosclerotic heart disease of native coronary artery without angina pectoris: Secondary | ICD-10-CM | POA: Diagnosis not present

## 2014-12-07 DIAGNOSIS — Z87891 Personal history of nicotine dependence: Secondary | ICD-10-CM | POA: Insufficient documentation

## 2014-12-07 DIAGNOSIS — G40909 Epilepsy, unspecified, not intractable, without status epilepticus: Secondary | ICD-10-CM | POA: Insufficient documentation

## 2014-12-07 DIAGNOSIS — R079 Chest pain, unspecified: Secondary | ICD-10-CM | POA: Insufficient documentation

## 2014-12-07 DIAGNOSIS — Z8249 Family history of ischemic heart disease and other diseases of the circulatory system: Secondary | ICD-10-CM | POA: Diagnosis not present

## 2014-12-07 DIAGNOSIS — J441 Chronic obstructive pulmonary disease with (acute) exacerbation: Principal | ICD-10-CM | POA: Insufficient documentation

## 2014-12-07 DIAGNOSIS — I25119 Atherosclerotic heart disease of native coronary artery with unspecified angina pectoris: Secondary | ICD-10-CM

## 2014-12-07 DIAGNOSIS — I252 Old myocardial infarction: Secondary | ICD-10-CM | POA: Insufficient documentation

## 2014-12-07 DIAGNOSIS — R05 Cough: Secondary | ICD-10-CM | POA: Diagnosis not present

## 2014-12-07 DIAGNOSIS — Z7982 Long term (current) use of aspirin: Secondary | ICD-10-CM | POA: Diagnosis not present

## 2014-12-07 DIAGNOSIS — K219 Gastro-esophageal reflux disease without esophagitis: Secondary | ICD-10-CM | POA: Insufficient documentation

## 2014-12-07 DIAGNOSIS — R069 Unspecified abnormalities of breathing: Secondary | ICD-10-CM | POA: Diagnosis not present

## 2014-12-07 DIAGNOSIS — I208 Other forms of angina pectoris: Secondary | ICD-10-CM

## 2014-12-07 DIAGNOSIS — R569 Unspecified convulsions: Secondary | ICD-10-CM

## 2014-12-07 HISTORY — DX: Tobacco use: Z72.0

## 2014-12-07 HISTORY — PX: TRANSTHORACIC ECHOCARDIOGRAM: SHX275

## 2014-12-07 LAB — I-STAT CHEM 8, ED
BUN: 10 mg/dL (ref 6–20)
CREATININE: 1 mg/dL (ref 0.61–1.24)
Calcium, Ion: 1.08 mmol/L — ABNORMAL LOW (ref 1.12–1.23)
Chloride: 98 mmol/L — ABNORMAL LOW (ref 101–111)
Glucose, Bld: 134 mg/dL — ABNORMAL HIGH (ref 65–99)
HEMATOCRIT: 46 % (ref 39.0–52.0)
Hemoglobin: 15.6 g/dL (ref 13.0–17.0)
POTASSIUM: 3.7 mmol/L (ref 3.5–5.1)
SODIUM: 137 mmol/L (ref 135–145)
TCO2: 23 mmol/L (ref 0–100)

## 2014-12-07 LAB — TROPONIN I: Troponin I: <0.03 ng/mL (ref <0.031)

## 2014-12-07 LAB — I-STAT TROPONIN, ED: Troponin i, poc: 0 ng/mL (ref 0.00–0.08)

## 2014-12-07 MED ORDER — PREDNISONE 20 MG PO TABS
40.0000 mg | ORAL_TABLET | Freq: Every day | ORAL | Status: DC
  Administered 2014-12-08: 40 mg via ORAL
  Filled 2014-12-07 (×2): qty 2

## 2014-12-07 MED ORDER — PNEUMOCOCCAL VAC POLYVALENT 25 MCG/0.5ML IJ INJ
0.5000 mL | INJECTION | INTRAMUSCULAR | Status: AC
  Administered 2014-12-08: 0.5 mL via INTRAMUSCULAR
  Filled 2014-12-07: qty 0.5

## 2014-12-07 MED ORDER — ACETAMINOPHEN 325 MG PO TABS: 650.0000 mg | ORAL_TABLET | ORAL | Status: DC | PRN

## 2014-12-07 MED ORDER — PREDNISONE 20 MG PO TABS: 40.0000 mg | ORAL_TABLET | Freq: Every day | ORAL | Status: DC

## 2014-12-07 MED ORDER — GI COCKTAIL ~~LOC~~: 30.0000 mL | Freq: Four times a day (QID) | ORAL | Status: DC | PRN

## 2014-12-07 MED ORDER — IPRATROPIUM BROMIDE 0.02 % IN SOLN
0.5000 mg | Freq: Once | RESPIRATORY_TRACT | Status: AC
  Administered 2014-12-07: 0.5 mg via RESPIRATORY_TRACT
  Filled 2014-12-07: qty 2.5

## 2014-12-07 MED ORDER — HYDROCODONE-ACETAMINOPHEN 10-325 MG PO TABS: 1.0000 | ORAL_TABLET | Freq: Four times a day (QID) | ORAL | Status: DC | PRN

## 2014-12-07 MED ORDER — SIMVASTATIN 40 MG PO TABS
40.0000 mg | ORAL_TABLET | Freq: Every day | ORAL | Status: DC
  Administered 2014-12-07: 40 mg via ORAL
  Filled 2014-12-07: qty 1

## 2014-12-07 MED ORDER — HYDROCODONE-ACETAMINOPHEN 10-325 MG PO TABS
1.0000 | ORAL_TABLET | Freq: Four times a day (QID) | ORAL | Status: DC | PRN
  Administered 2014-12-07 – 2014-12-08 (×3): 1 via ORAL
  Filled 2014-12-07 (×3): qty 1

## 2014-12-07 MED ORDER — DOCUSATE SODIUM 100 MG PO CAPS
100.0000 mg | ORAL_CAPSULE | Freq: Two times a day (BID) | ORAL | Status: DC
  Administered 2014-12-07 – 2014-12-08 (×2): 100 mg via ORAL
  Filled 2014-12-07 (×2): qty 1

## 2014-12-07 MED ORDER — ASPIRIN 325 MG PO TABS
325.0000 mg | ORAL_TABLET | Freq: Every day | ORAL | Status: DC
  Administered 2014-12-08: 325 mg via ORAL
  Filled 2014-12-07: qty 1

## 2014-12-07 MED ORDER — ALBUTEROL SULFATE (2.5 MG/3ML) 0.083% IN NEBU
2.5000 mg | INHALATION_SOLUTION | RESPIRATORY_TRACT | Status: DC
  Administered 2014-12-07 – 2014-12-08 (×3): 2.5 mg via RESPIRATORY_TRACT
  Filled 2014-12-07 (×3): qty 3

## 2014-12-07 MED ORDER — PREDNISONE 20 MG PO TABS
60.0000 mg | ORAL_TABLET | Freq: Once | ORAL | Status: AC
  Administered 2014-12-07: 60 mg via ORAL
  Filled 2014-12-07: qty 3

## 2014-12-07 MED ORDER — METOPROLOL TARTRATE 12.5 MG HALF TABLET
12.5000 mg | ORAL_TABLET | Freq: Two times a day (BID) | ORAL | Status: DC
  Administered 2014-12-07 – 2014-12-08 (×2): 12.5 mg via ORAL
  Filled 2014-12-07 (×2): qty 1

## 2014-12-07 MED ORDER — FAMOTIDINE 20 MG PO TABS
20.0000 mg | ORAL_TABLET | Freq: Every day | ORAL | Status: DC
  Administered 2014-12-08: 20 mg via ORAL
  Filled 2014-12-07: qty 1

## 2014-12-07 MED ORDER — OXCARBAZEPINE 300 MG PO TABS
600.0000 mg | ORAL_TABLET | Freq: Three times a day (TID) | ORAL | Status: DC
  Administered 2014-12-07 – 2014-12-08 (×2): 600 mg via ORAL
  Filled 2014-12-07 (×4): qty 2

## 2014-12-07 MED ORDER — SIMVASTATIN 40 MG PO TABS: 40.0000 mg | ORAL_TABLET | Freq: Every day | ORAL | Status: DC

## 2014-12-07 MED ORDER — IPRATROPIUM-ALBUTEROL 0.5-2.5 (3) MG/3ML IN SOLN
3.0000 mL | Freq: Once | RESPIRATORY_TRACT | Status: AC
  Administered 2014-12-07: 3 mL via RESPIRATORY_TRACT
  Filled 2014-12-07: qty 3

## 2014-12-07 MED ORDER — ONDANSETRON HCL 4 MG/2ML IJ SOLN: 4.0000 mg | Freq: Four times a day (QID) | INTRAMUSCULAR | Status: DC | PRN

## 2014-12-07 MED ORDER — ALBUTEROL SULFATE (2.5 MG/3ML) 0.083% IN NEBU
5.0000 mg | INHALATION_SOLUTION | Freq: Once | RESPIRATORY_TRACT | Status: AC
  Administered 2014-12-07: 5 mg via RESPIRATORY_TRACT
  Filled 2014-12-07: qty 6

## 2014-12-07 NOTE — Progress Notes (Signed)
Peter Barnett [id number] Admitted to 5W30: 12/07/2014 8:54 PM Attending Provider: Edwin Dada, MD    Peter Barnett is a 54 y.o. male patient admitted from ED awake, alert  & orientated  X 3,  Full Code, VSS - Blood pressure 148/72, pulse 86, temperature 98.4 F (36.9 C), temperature source Oral, resp. rate 17, SpO2 94 %.RA, no c/o shortness of breath, no c/o chest pain, no distress noted. Tele # 25 placed and pt is currently running: SR   IV site WDL:  with a transparent dsg that's clean dry and intact.  Allergies:  No Known Allergies   Past Medical History  Diagnosis Date  . HTN (hypertension)   . CAD (coronary artery disease)     Premature; stents + known distal RCA/PDA occlusion  . MI (myocardial infarction) ([place]) 2009    "just had arm pain; never any chest pain"  . Exertional dyspnea 10/07/2011  . 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 Headache(784.0) 10/07/2011     a couple per month  . Pneumonia ~ 2010    "2 years in a row; May both times"  . 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 Chronic lower back pain     Still with recurrent left sided LBP with paresthesias down left leg --primarily with activities: pain pill use is avg <90 tabs per mo  . Urethral stricture since 1981    Recurrent dilation has been required [facility] urology)  . History of adenomatous polyp of colon 02/2009;02/2014    Recall 5 yrs ([facility])  . COPD (chronic obstructive pulmonary disease) ([place])     NOTED on CT chest 11/2013 done for lung ca screening  . Hyperlipidemia   . Nephrolithiasis 03/2014    Bilateral (71mm) nonobstructing    History:  obtained from patient  Pt orientation to unit, room and routine. Information packet given to patient and safety video refused.  Admission INP armband ID verified with patient, and in place. SR up x 2, fall risk assessment complete with Patient verbalizing understanding of risks associated with  falls. Pt verbalizes an understanding of how to use the call bell and to call for help before getting out of bed.  Skin, clean-dry- intact without evidence of bruising, or skin tears.   No evidence of skin break down noted on exam.   Will cont to monitor and assist as needed.  Parthenia Ames, RN 12/07/2014 8:54 PM

## 2014-12-07 NOTE — ED Provider Notes (Signed)
CSN: [id number]     Arrival date & time 12/07/14  1717 History   First MD Initiated Contact with Patient 12/07/14 1719     Chief Complaint  Patient presents with  . Shortness of Breath     Patient is a 54 y.o. male presenting with shortness of breath. The history is provided by the patient.  Shortness of Breath Severity:  Moderate Onset quality:  Sudden Timing:  Constant Progression:  Improving Chronicity:  New Relieved by: NTG and albuterol. Worsened by:  Nothing tried Associated symptoms: cough   Associated symptoms: no chest pain, no fever, no syncope and no vomiting   PT reports several hours ago he developed left arm "tingling" and SOB, he felt this was similar to prior episodes of MI so he took NTG - this improved his symptoms but did trigger HA He also reports jaw pain but also reports left LE "tingling" on lateral aspect only No facial/arm/leg weakness No visual changes No active CP He is now feeling improved  He reports recent diagnosis of pneumonia and was on outpatient antibiotics   Past Medical History  Diagnosis Date  . HTN (hypertension)   . CAD (coronary artery disease)     Premature; stents + known distal RCA/PDA occlusion  . MI (myocardial infarction) ([place]) 2009    "just had arm pain; never any chest pain"  . Exertional dyspnea 10/07/2011  . 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 Headache(784.0) 10/07/2011     a couple per month  . Pneumonia ~ 2010    "2 years in a row; May both times"  . 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 Chronic lower back pain     Still with recurrent left sided LBP with paresthesias down left leg --primarily with activities: pain pill use is avg <90 tabs per mo  . Urethral stricture since 1981    Recurrent dilation has been required [facility] urology)  . History of adenomatous polyp of colon 02/2009;02/2014    Recall 5 yrs ([facility])  . COPD (chronic obstructive pulmonary  disease) ([place])     NOTED on CT chest 11/2013 done for lung ca screening  . Hyperlipidemia   . Nephrolithiasis 03/2014    Bilateral (62mm) nonobstructing   Past Surgical History  Procedure Laterality Date  . Craniotomy for avm  1981    Dx'd after pt began having seizures.  . Tonsillectomy      "I was little"  . Lumbar disc surgery  2008; 2009    Dr. Joya Salm  . Coronary angioplasty with stent placement  2009    3 DES to LCx; still with known distal RCA/PDA occlusion.  EF 60% by LV-gram.  . Colonoscopy w/ polypectomy  02/2009;02/2014    Recall 5 yr (aden poly, hyperplast polyp, int and ext hemorr).  Next recall is 02/2019.  . Cardiac catheterization  2011    [facility], records unavailable: pt reports "normal".   Family History  Problem Relation Age of Onset  . Hyperlipidemia Mother   . Cancer Father   . Heart attack Neg Hx   . Hypertension Father   . Stroke Neg Hx    Social History  Substance Use Topics  . Smoking status: Former Smoker -- 3.00 packs/day for 27 years    Types: Cigarettes    Quit date: 02/08/2001  . Smokeless tobacco: Never Used  . Alcohol Use: 0.6 oz/week    1 Cans of beer per week  Review of Systems  Constitutional: Negative for fever.  Respiratory: Positive for cough and shortness of breath.   Cardiovascular: Negative for chest pain, leg swelling and syncope.  Gastrointestinal: Negative for vomiting.  Neurological: Negative for syncope.  All other systems reviewed and are negative.     Allergies  Review of patient's allergies indicates no known allergies.  Home Medications   Prior to Admission medications   Medication Sig Start Date End Date Taking? Authorizing Provider  aspirin 81 MG tablet Take 1 tablet (81 mg total) by mouth daily. 12/05/14   Jettie Booze, MD  benzonatate (TESSALON) 200 MG capsule Take 1 capsule (200 mg total) by mouth 2 (two) times daily as needed for cough. 11/28/14   Renee A Kuneff, DO  Cholecalciferol (PA VITAMIN D-3)  2000 UNITS CAPS Take by mouth daily.     Historical Provider, MD  docusate sodium (COLACE) 250 MG capsule Take 250 mg by mouth daily.    Historical Provider, MD  famotidine (PEPCID) 20 MG tablet Take 20 mg by mouth daily.    Historical Provider, MD  HYDROcodone-acetaminophen (NORCO) 10-325 MG per tablet Take 1 tablet by mouth every 6 (six) hours as needed (PAIN).  08/07/14   Historical Provider, MD  metoprolol tartrate (LOPRESSOR) 25 MG tablet TAKE 1/2 TABLET BY MOUTH TWICE DAILY 09/04/14   Tammi Sou, MD  nitroGLYCERIN (NITROSTAT) 0.4 MG SL tablet Place 1 tablet (0.4 mg total) under the tongue every 5 (five) minutes as needed for chest pain (CP or SOB). 12/05/14 02/01/18  Jettie Booze, MD  oxcarbazepine (TRILEPTAL) 600 MG tablet Take 1 tablet (600 mg total) by mouth 3 (three) times daily. 08/15/14   Dennie Bible, NP  simvastatin (ZOCOR) 40 MG tablet Take 40 mg by mouth every evening.    Historical Provider, MD   BP 165/64 mmHg  Pulse 74  Temp(Src) 98.8 F (37.1 C) (Oral)  Resp 20  SpO2 94% Physical Exam CONSTITUTIONAL: Well developed/well nourished HEAD: Normocephalic/atraumatic EYES: EOMI/PERRL ENMT: Mucous membranes moist NECK: supple no meningeal signs SPINE/BACK:entire spine nontender CV: S1/S2 noted, no murmurs/rubs/gallops noted LUNGS: coarse wheezing bilaterally, no apparent distress ABDOMEN: soft, nontender, no rebound or guarding, bowel sounds noted throughout abdomen GU:no cva tenderness NEURO: Pt is awake/alert/appropriate, moves all extremitiesx4.  No facial droop.  No arm/leg drift.  He reports numbness to lateral aspect of left LE but no weakness noted.  No other sensory deficits noted EXTREMITIES: pulses normal/equal, full ROM SKIN: warm, color normal PSYCH: no abnormalities of mood noted, alert and oriented to situation  ED Course  Procedures  6:14 PM His main issue appears to be cough/sob and concern for ACS given left arm pain/tingling similar to  prior MI Will order neb treatments for his wheeze here in the ED Recent outpatient evaluations did not reveal CHF He has no signs of stroke or acute neurologic emergency as cause of his "tingling" EMS gave patient nebulizer for his SOB and some improvement 7:16 PM Pt still with wheezing and on nebs at this time No new CP at this time Given h/o CAD with anginal equivalent (left arm pain/tingling and SOB) will admit as he is high risk He would also benefit from continued treatment for COPD Pt agreeable D/w dr danford will admit to tele OBS BP 133/66 mmHg  Pulse 81  Temp(Src) 98.8 F (37.1 C) (Oral)  Resp 16  SpO2 96%  Labs Review Labs Reviewed  I-STAT CHEM 8, ED - Abnormal; Notable for the following:  Chloride 98 (*)    Glucose, Bld 134 (*)    Calcium, Ion 1.08 (*)    All other components within normal limits  I-STAT TROPOININ, ED    Imaging Review Dg Chest 2 View  12/07/2014  CLINICAL DATA:  Tingling in arm/legs, cough EXAM: CHEST  2 VIEW COMPARISON:  11/28/2014 FINDINGS: Mild bibasilar opacities, likely atelectasis. No focal consolidation. No pleural effusion or pneumothorax. The heart is normal in size. Mild degenerative changes of the visualized thoracolumbar spine. IMPRESSION: Mild bibasilar opacities, likely atelectasis. No evidence of acute cardiopulmonary disease. Electronically Signed   By: Julian Hy M.D.   On: 12/07/2014 18:23   I have personally reviewed and evaluated these images and lab results as part of my medical decision-making.   EKG Interpretation   Date/Time:  Saturday December 07 2014 17:25:22 EDT Ventricular Rate:  87 PR Interval:  179 QRS Duration: 110 QT Interval:  376 QTC Calculation: 452 R Axis:   -36 Text Interpretation:  Sinus rhythm Probable left atrial enlargement Left  axis deviation Abnormal R-wave progression, early transition artifact  noted No significant change since last tracing Confirmed by Christy Gentles  MD,  Stefana Lodico ([id number]) on  12/07/2014 5:37:16 PM     Medications  albuterol (PROVENTIL) (2.5 MG/3ML) 0.083% nebulizer solution 5 mg (5 mg Nebulization Given 12/07/14 1840)  ipratropium (ATROVENT) nebulizer solution 0.5 mg (0.5 mg Nebulization Given 12/07/14 1840)  predniSONE (DELTASONE) tablet 60 mg (60 mg Oral Given 12/07/14 1839)    MDM   Final diagnoses:  Chronic obstructive pulmonary disease with acute exacerbation ([place])  Anginal equivalent ([place])    Nursing notes including past medical history and social history reviewed and considered in documentation xrays/imaging reviewed by myself and considered during evaluation Labs/vital reviewed myself and considered during evaluation Previous records reviewed and considered     Ripley Fraise, MD 12/07/14 [phone]

## 2014-12-07 NOTE — ED Notes (Signed)
Patient denies pain and is resting comfortably.  

## 2014-12-07 NOTE — ED Notes (Signed)
Per ems- today at 1400 had left arm tingling and numbness into left jaw. Has hx of MI, with similar presentation. EKG SR, HR 80, 146/84. Has been treated for pneumonia recent. Given 5 mg albuterol, took 325 aspirin, 1 nitro. Is a x 4

## 2014-12-07 NOTE — H&P (Signed)
History and Physical  Patient Name: Peter Barnett     YOV:[id number]    DOB: 08-29-1960    DOA: 12/07/2014 Referring physician: Nicola Girt, MD PCP: Tammi Sou, MD      Chief Complaint: Arm tingling and dyspnea/cough  HPI: Peter Barnett is a 54 y.o. 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  To be clear the patient is a long-time smoker who was noted to have CT findings of emphysema on a lung cancer screening CT 1 year ago but has never had PFTs or needed bronchodilators or had a COPD flare.  However, his wife notes that he has been increasingly dyspneic with exertion over the last several months.   Then around 2-1/2 weeks ago he had a URI and fever that developed into a lingering cough, chest congestion, and progressive dyspnea. He went to see his PCP one week ago and got Levaquin and cough syrup which did not help. Now in the last week his dyspnea and chest congestion continue and today he developed acute left arm and neck tingling which he now remembers to be identical to his angina from 2009 when he had PCI x2.    In the ED, the patient's ECG was unchanged from his previous and initial troponin was negative. His electrolytes were unremarkable, he was wheezing on exam.  His chest x-ray showed no focal opacity, just interstitial markings similar to 1 week ago when he was started on levofloxacin.  He felt better with nebulized bronchodilators, and [facility] were asked to admit for COPD exacerbation.     Review of Systems:  Pt complains of chest congestion, cough, dyspnea relieved with bronchodilators, hand tingling, leg tingling now resolved. Pt denies any fever, sputum production, hemoptysis, chest discomfort, chest pressure, nausea,  diaphoresis.  All other systems negative except as just noted or noted in the history of present illness.  No Known Allergies  Prior to Admission medications   Medication Sig Start Date End Date Taking? Authorizing Provider  acetaminophen (TYLENOL) 500 MG tablet Take 1,000 mg by mouth every 6 (six) hours as needed for headache.   Yes Historical Provider, MD  aspirin 325 MG tablet Take 325 mg by mouth daily.   Yes Historical Provider, MD  benzonatate (TESSALON) 200 MG capsule Take 1 capsule (200 mg total) by mouth 2 (two) times daily as needed for cough. 11/28/14  Yes Renee A Kuneff, DO  Cholecalciferol (PA VITAMIN D-3) 2000 UNITS CAPS Take 2,000 Units by mouth daily.    Yes Historical Provider, MD  docusate sodium (COLACE) 100 MG capsule Take 100 mg by mouth 2 (two) times daily.   Yes Historical Provider, MD  famotidine (PEPCID) 20 MG tablet Take 20 mg by mouth daily.   Yes Historical Provider, MD  HYDROcodone-acetaminophen (NORCO) 10-325 MG per tablet Take 1 tablet by mouth every 6 (six) hours as needed (pain).  08/07/14  Yes Historical Provider, MD  HYDROcodone-homatropine (HYDROMET) 5-1.5 MG/5ML syrup Take 5 mLs by mouth every 8 (eight) hours as needed for cough.   Yes Historical Provider, MD  metoprolol tartrate (LOPRESSOR) 25 MG tablet TAKE 1/2 TABLET BY MOUTH TWICE DAILY 09/04/14  Yes Tammi Sou, MD  nitroGLYCERIN (NITROSTAT) 0.4 MG SL tablet Place 1 tablet (0.4 mg  total) under the tongue every 5 (five) minutes as needed for chest pain (CP or SOB). Patient taking differently: Place 0.4 mg under the tongue every 5 (five) minutes as needed (chest pain or shortness of breath).  12/05/14 02/01/18 Yes Jettie Booze, MD  oxcarbazepine (TRILEPTAL) 600 MG tablet Take 1 tablet (600 mg total) by mouth 3 (three) times daily. 08/15/14  Yes Dennie Bible, NP  simvastatin (ZOCOR) 40 MG tablet Take 40 mg by mouth daily with supper.    Yes Historical Provider, MD  aspirin 81 MG tablet Take 1  tablet (81 mg total) by mouth daily. Patient not taking: Reported on 12/07/2014 12/05/14   Jettie Booze, MD    Past Medical History  Diagnosis Date  . HTN (hypertension)   . CAD (coronary artery disease)     Premature; stents + known distal RCA/PDA occlusion  . MI (myocardial infarction) ([place]) 2009    "just had arm pain; never any chest pain"  . Exertional dyspnea 10/07/2011  . 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 Headache(784.0) 10/07/2011     a couple per month  . Pneumonia ~ 2010    "2 years in a row; May both times"  . 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 Chronic lower back pain     Still with recurrent left sided LBP with paresthesias down left leg --primarily with activities: pain pill use is avg <90 tabs per mo  . Urethral stricture since 1981    Recurrent dilation has been required [facility] urology)  . History of adenomatous polyp of colon 02/2009;02/2014    Recall 5 yrs ([facility])  . COPD (chronic obstructive pulmonary disease) ([place])     NOTED on CT chest 11/2013 done for lung ca screening  . Hyperlipidemia   . Nephrolithiasis 03/2014    Bilateral (20mm) nonobstructing    Past Surgical History  Procedure Laterality Date  . Craniotomy for avm  1981    Dx'd after pt began having seizures.  . Tonsillectomy      "I was little"  . Lumbar disc surgery  2008; 2009    Dr. Joya Salm  . Coronary angioplasty with stent placement  2009    3 DES to LCx; still with known distal RCA/PDA occlusion.  EF 60% by LV-gram.  . Colonoscopy w/ polypectomy  02/2009;02/2014    Recall 5 yr (aden poly, hyperplast polyp, int and ext hemorr).  Next recall is 02/2019.  . Cardiac catheterization  2011    [place], records unavailable: pt reports "normal".    Family history: family history includes mesothelioma cancer in his father; Heart attack in his paternal uncles in their 39s; Hyperlipidemia in his mother; Hypertension in his  father. There is no history of Stroke.  Social History: Patient lives with his wife.  He is a former smoker, but smoke now makes him cough terribly.  He drinks one beer per day.       Physical Exam: BP 133/66 mmHg  Pulse 81  Temp(Src) 98.8 F (37.1 C) (Oral)  Resp 16  SpO2 96% General appearance: Well-developed, adult male, alert and in no acute distress.   Eyes: Slightly icteric, left exotropia at rest, small left conjunctival hemorrhage.   ENT: No nasal deformity, discharge, or epistaxis.  OP moist without lesions.   Skin: Warm and dry.  No jaundice.  No suspicious rashes or lesions. Cardiac: RRR, nl S1-S2, no murmurs appreciated.  Capillary refill is  brisk.  JVP normal.  No LE edema.  Radial pulses 2+ and symmetric. Respiratory: Normal respiratory rate and rhythm.  Coughs frequently.  Tight breath sounds with bilateral wheezes diffuse without rales. Abdomen: Abdomen soft without rigidity.  No TTP. No ascites, distension.   MSK: No deformities or effusions. Neuro: Sensorium intact and responding to questions, attention normal.  Speech is fluent.  Moves all extremities equally and with normal coordination.    Psych: Behavior appropriate.  Affect normal.  No evidence of aural or visual hallucinations or delusions.       Labs on Admission:  The metabolic panel shows normal sodium, potassium, serum creatinine. The initial troponin is negative. BNP 2 days ago was normal. The complete blood count shows no leukocytosis or anemia.   Radiological Exams on Admission: Personally reviewed: Dg Chest 2 View 12/07/2014   Trace bilateral markings. No focal opacities.    EKG: Independently reviewed. Normal sinus rhythm with old left axis, and no ST changes.    Assessment/Plan  1. COPD exacerbation:  This is new.  The patient has radiographic evidence of structural lung disease, but has never had a flare before nor does he take a maintenance inhaler.  His presentation is one of  subacute shortness of breath with exertion followed by a URI causing acute and persistent dyspnea and chest congestion with wheezing on exam.  -Prednisone 40 mg for 5 days -Nebulized albuterol-ipratropium once followed by albuterol every four hours and PRN -PPSV 23 vaccination  2. Chest pain:  The patient describes what he remembers is his anginal equivalent. In 2013 he was admitted for chest pain rule out and had a normal Myoview. At that time Dr. Hassell Done note suggests that he had arm tingling which was not anginal but arm discomfort/pain which may have been, but I don't draw that distinction from the patient today. -Admit to tele -Serial troponins -Telemetry -Echo is ordered -Consult to Cardiology for risk stratification for angina  3. HTN and coronary disease:  Continue home metoprolol and aspirin and statin.  4. GERD:  Stable -GI cocktail if needed and continue home H2RA  5. Seizure disorder:  Stable. No report of seizure like activity on admission.  This is presumably from his previous AVM. -Continue home Trileptal     DVT PPx: Low risk Diet: Regular Consultants: Cardiology Code Status: Full Family Communication: The patient's differential diagnosis, workup, and treatment plan were discussed with his wife at the present at the bedside. CODE STATUS was confirmed.  Medical decision making: What exists of the patient's previous chart was reviewed in depth and the case was discussed with Dr. Christy Gentles. Patient seen 7:42 PM on 12/07/2014.  Disposition Plan:  Admit for telemetry and serial troponins. Cardiac Consultation tomorrow for risk stratification given [place] anginal equivalent.  Treat for first COPD exacerbation. Prednisone and bronchodilators. Patient may benefit from PFTs as an outpatient and consideration of combined daily maintenance inhaler.     Edwin Dada [facility] Pager [phone]

## 2014-12-07 NOTE — ED Notes (Signed)
Attempted report.  Per Network engineer unaware of admit.  Requested I call back in a few minutes.

## 2014-12-08 ENCOUNTER — Observation Stay (HOSPITAL_BASED_OUTPATIENT_CLINIC_OR_DEPARTMENT_OTHER): Payer: Medicare HMO

## 2014-12-08 ENCOUNTER — Encounter (HOSPITAL_COMMUNITY): Payer: Self-pay | Admitting: Family Medicine

## 2014-12-08 DIAGNOSIS — R079 Chest pain, unspecified: Secondary | ICD-10-CM | POA: Diagnosis not present

## 2014-12-08 DIAGNOSIS — J441 Chronic obstructive pulmonary disease with (acute) exacerbation: Secondary | ICD-10-CM | POA: Diagnosis not present

## 2014-12-08 LAB — TROPONIN I
Troponin I: <0.03 ng/mL (ref <0.031)
Troponin I: <0.03 ng/mL (ref <0.031)

## 2014-12-08 LAB — COMPREHENSIVE METABOLIC PANEL
ALK PHOS: 81 U/L (ref 38–126)
ALT: 26 U/L (ref 17–63)
AST: 23 U/L (ref 15–41)
Albumin: 3.5 g/dL (ref 3.5–5.0)
Anion gap: 11 (ref 5–15)
BILIRUBIN TOTAL: 0.4 mg/dL (ref 0.3–1.2)
BUN: 8 mg/dL (ref 6–20)
CALCIUM: 9.3 mg/dL (ref 8.9–10.3)
CO2: 20 mmol/L — AB (ref 22–32)
CREATININE: 0.81 mg/dL (ref 0.61–1.24)
Chloride: 105 mmol/L (ref 101–111)
GFR calc non Af Amer: >60 mL/min (ref >60)
Glucose, Bld: 159 mg/dL — ABNORMAL HIGH (ref 65–99)
Potassium: 4.4 mmol/L (ref 3.5–5.1)
SODIUM: 136 mmol/L (ref 135–145)
Total Protein: 6.5 g/dL (ref 6.5–8.1)

## 2014-12-08 LAB — MAGNESIUM: Magnesium: 2.1 mg/dL (ref 1.7–2.4)

## 2014-12-08 MED ORDER — PREDNISONE 20 MG PO TABS: 40.0000 mg | ORAL_TABLET | Freq: Every day | ORAL | Status: DC

## 2014-12-08 MED ORDER — ASPIRIN EC 81 MG PO TBEC: 81.0000 mg | DELAYED_RELEASE_TABLET | Freq: Every day | ORAL | Status: DC

## 2014-12-08 MED ORDER — ALBUTEROL SULFATE HFA 108 (90 BASE) MCG/ACT IN AERS: 2.0000 | INHALATION_SPRAY | Freq: Four times a day (QID) | RESPIRATORY_TRACT | Status: AC | PRN

## 2014-12-08 MED ORDER — HYDROCODONE-HOMATROPINE 5-1.5 MG/5ML PO SYRP: 5.0000 mL | ORAL_SOLUTION | Freq: Three times a day (TID) | ORAL | Status: DC | PRN

## 2014-12-08 MED ORDER — ALBUTEROL SULFATE (2.5 MG/3ML) 0.083% IN NEBU: 2.5000 mg | INHALATION_SOLUTION | RESPIRATORY_TRACT | Status: DC | PRN

## 2014-12-08 NOTE — Progress Notes (Signed)
NURSING PROGRESS NOTE  Peter Barnett [id number] Discharge Data: 12/08/2014 1:05 PM Attending Provider: Reyne Dumas, MD FKC:[email or web address],YFVCBS H, MD     [name] to be D/C'd Home per MD order.  Discussed with the patient the After Visit Summary and all questions fully answered. All IV's discontinued with no bleeding noted. All belongings returned to patient for patient to take home.   Last Vital Signs:  Blood pressure 143/72, pulse 88, temperature 97.7 F (36.5 C), temperature source Oral, resp. rate 18, SpO2 95 %.  Discharge Medication List   Medication List    STOP taking these medications        aspirin 325 MG tablet  Replaced by:  aspirin EC 81 MG tablet      TAKE these medications        acetaminophen 500 MG tablet  Commonly known as:  TYLENOL  Take 1,000 mg by mouth every 6 (six) hours as needed for headache.     albuterol 108 (90 BASE) MCG/ACT inhaler  Commonly known as:  PROVENTIL HFA;VENTOLIN HFA  Inhale 2 puffs into the lungs every 6 (six) hours as needed for wheezing or shortness of breath.     aspirin EC 81 MG tablet  Take 1 tablet (81 mg total) by mouth daily.     benzonatate 200 MG capsule  Commonly known as:  TESSALON  Take 1 capsule (200 mg total) by mouth 2 (two) times daily as needed for cough.     docusate sodium 100 MG capsule  Commonly known as:  COLACE  Take 100 mg by mouth 2 (two) times daily.     famotidine 20 MG tablet  Commonly known as:  PEPCID  Take 20 mg by mouth daily.     HYDROcodone-acetaminophen 10-325 MG tablet  Commonly known as:  NORCO  Take 1 tablet by mouth every 6 (six) hours as needed (pain).     HYDROcodone-homatropine 5-1.5 MG/5ML syrup  Commonly known as:  HYDROMET  Take 5 mLs by mouth every 8 (eight) hours as needed for cough.     metoprolol tartrate 25 MG tablet  Commonly known as:  LOPRESSOR  TAKE 1/2 TABLET BY MOUTH TWICE DAILY     nitroGLYCERIN 0.4 MG SL tablet  Commonly known as:  NITROSTAT  Place 1  tablet (0.4 mg total) under the tongue every 5 (five) minutes as needed for chest pain (CP or SOB).     oxcarbazepine 600 MG tablet  Commonly known as:  TRILEPTAL  Take 1 tablet (600 mg total) by mouth 3 (three) times daily.     PA VITAMIN D-3 2000 UNITS Caps  Generic drug:  Cholecalciferol  Take 2,000 Units by mouth daily.     predniSONE 20 MG tablet  Commonly known as:  DELTASONE  Take 2 tablets (40 mg total) by mouth daily with breakfast.     simvastatin 40 MG tablet  Commonly known as:  ZOCOR  Take 40 mg by mouth daily with supper.         Charolette Child, RN

## 2014-12-08 NOTE — Progress Notes (Signed)
Physician Discharge Summary  [name] MRN: [id number] DOB/AGE: 07/09/1960 54 y.o.  PCP: Tammi Sou, MD   Admit date: 12/07/2014 Discharge date: 12/08/2014  Discharge Diagnoses:   Principal Problem:   COPD exacerbation ([place]) Active Problems:   Seizures ([place])   HTN (hypertension), benign   Hyperlipidemia   Coronary atherosclerosis of native coronary artery    Follow-up recommendations Follow-up with PCP in 3-5 days , including all  additional recommended appointments as below Follow-up CBC, CMP in 3-5 days Patient to follow-up with his cardiologist Jettie Booze, MD in 1-2 weeks     Medication List    STOP taking these medications        aspirin 325 MG tablet  Replaced by:  aspirin EC 81 MG tablet      TAKE these medications        acetaminophen 500 MG tablet  Commonly known as:  TYLENOL  Take 1,000 mg by mouth every 6 (six) hours as needed for headache.     aspirin EC 81 MG tablet  Take 1 tablet (81 mg total) by mouth daily.     benzonatate 200 MG capsule  Commonly known as:  TESSALON  Take 1 capsule (200 mg total) by mouth 2 (two) times daily as needed for cough.     docusate sodium 100 MG capsule  Commonly known as:  COLACE  Take 100 mg by mouth 2 (two) times daily.     famotidine 20 MG tablet  Commonly known as:  PEPCID  Take 20 mg by mouth daily.     HYDROcodone-acetaminophen 10-325 MG tablet  Commonly known as:  NORCO  Take 1 tablet by mouth every 6 (six) hours as needed (pain).     HYDROcodone-homatropine 5-1.5 MG/5ML syrup  Commonly known as:  HYDROMET  Take 5 mLs by mouth every 8 (eight) hours as needed for cough.     metoprolol tartrate 25 MG tablet  Commonly known as:  LOPRESSOR  TAKE 1/2 TABLET BY MOUTH TWICE DAILY     nitroGLYCERIN 0.4 MG SL tablet  Commonly known as:  NITROSTAT  Place 1 tablet (0.4 mg total) under the tongue every 5 (five) minutes as needed for chest pain (CP or SOB).     oxcarbazepine 600 MG  tablet  Commonly known as:  TRILEPTAL  Take 1 tablet (600 mg total) by mouth 3 (three) times daily.     PA VITAMIN D-3 2000 UNITS Caps  Generic drug:  Cholecalciferol  Take 2,000 Units by mouth daily.     predniSONE 20 MG tablet  Commonly known as:  DELTASONE  Take 2 tablets (40 mg total) by mouth daily with breakfast.     simvastatin 40 MG tablet  Commonly known as:  ZOCOR  Take 40 mg by mouth daily with supper.         Discharge Condition: *Stable Discharge Instructions       Discharge Instructions    Diet - low sodium heart healthy    Complete by:  As directed      Increase activity slowly    Complete by:  As directed            No Known Allergies    Disposition: 01-Home or Self Care   Consults: None    Significant Diagnostic Studies:  Dg Chest 2 View  12/07/2014  CLINICAL DATA:  Tingling in arm/legs, cough EXAM: CHEST  2 VIEW COMPARISON:  11/28/2014 FINDINGS: Mild bibasilar opacities, likely atelectasis. No focal consolidation. No  pleural effusion or pneumothorax. The heart is normal in size. Mild degenerative changes of the visualized thoracolumbar spine. IMPRESSION: Mild bibasilar opacities, likely atelectasis. No evidence of acute cardiopulmonary disease. Electronically Signed   By: Julian Hy M.D.   On: 12/07/2014 18:23   Dg Chest 2 View  11/28/2014  CLINICAL DATA:  Cough and fever for 1 week EXAM: CHEST - 2 VIEW COMPARISON:  10/07/2011 FINDINGS: Cardiac shadow is within normal limits. The lungs are well-aerated without focal infiltrate or sizable effusion. Nipple shadow is noted on the right over the anterior aspect of the right sixth rib. No acute bony abnormality is seen. IMPRESSION: No acute abnormality noted. Electronically Signed   By: [name] M.D.   On: 11/28/2014 16:46    2-D echo pending   There were no vitals filed for this visit.   Microbiology: No results found for this or any previous visit (from the past 240 hour(s)).      Blood Culture    Component Value Date/Time   SDES BLOOD LEFT HAND 04/28/2013 2200   SPECREQUEST  04/28/2013 2200    BOTTLES DRAWN AEROBIC AND ANAEROBIC AER 5cc ANA 4cc   CULT  04/28/2013 2200    NO GROWTH 5 DAYS Performed at [facility] 05/05/2013 FINAL 04/28/2013 2200      Labs: Results for orders placed or performed during the hospital encounter of 12/07/14 (from the past 48 hour(s))  I-stat troponin, ED     Status: None   Collection Time: 12/07/14  5:50 PM  Result Value Ref Range   Troponin i, poc 0.00 0.00 - 0.08 ng/mL   Comment 3            Comment: Due to the release kinetics of cTnI, a negative result within the first hours of the onset of symptoms does not rule out myocardial infarction with certainty. If myocardial infarction is still suspected, repeat the test at appropriate intervals.   I-stat chem 8, ed     Status: Abnormal   Collection Time: 12/07/14  5:52 PM  Result Value Ref Range   Sodium 137 135 - 145 mmol/L   Potassium 3.7 3.5 - 5.1 mmol/L   Chloride 98 (L) 101 - 111 mmol/L   BUN 10 6 - 20 mg/dL   Creatinine, Ser 1.00 0.61 - 1.24 mg/dL   Glucose, Bld 134 (H) 65 - 99 mg/dL   Calcium, Ion 1.08 (L) 1.12 - 1.23 mmol/L   TCO2 23 0 - 100 mmol/L   Hemoglobin 15.6 13.0 - 17.0 g/dL   HCT 46.0 39.0 - 52.0 %  Troponin I-serum (0, 3, 6 hours)     Status: None   Collection Time: 12/07/14  9:59 PM  Result Value Ref Range   Troponin I <0.03 <0.031 ng/mL    Comment:        NO INDICATION OF MYOCARDIAL INJURY.   Troponin I-serum (0, 3, 6 hours)     Status: None   Collection Time: 12/07/14 11:26 PM  Result Value Ref Range   Troponin I <0.03 <0.031 ng/mL    Comment:        NO INDICATION OF MYOCARDIAL INJURY.   Troponin I-serum (0, 3, 6 hours)     Status: None   Collection Time: 12/08/14  2:54 AM  Result Value Ref Range   Troponin I <0.03 <0.031 ng/mL    Comment:        NO INDICATION OF MYOCARDIAL INJURY.  Lipid Panel      Component Value Date/Time   CHOL 158 10/31/2013 0943   TRIG 128.0 10/31/2013 0943   HDL 37.80* 10/31/2013 0943   CHOLHDL 4 10/31/2013 0943   VLDL 25.6 10/31/2013 0943   LDLCALC 95 10/31/2013 0943     Lab Results  Component Value Date   HGBA1C 5.8 10/31/2013   HGBA1C  09/19/2007    5.4 (NOTE)   The ADA recommends the following therapeutic goal for glycemic   control related to Hgb A1C measurement:   Goal of Therapy:   < 7.0% Hgb A1C   Reference: American Diabetes Association: Clinical Practice   Recommendations 2008, Diabetes Care,  2008, 31:(Suppl 1).     Lab Results  Component Value Date   LDLCALC 95 10/31/2013   CREATININE 1.00 12/07/2014     HPI Peter Barnett is a 54 y.o. 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  To be clear the patient is a long-time smoker who was noted to have CT findings of emphysema on a lung cancer screening CT 1 year ago but has never had PFTs or needed bronchodilators or had a COPD flare. However, his wife notes that he has been increasingly dyspneic with exertion over the last several months.   Then around 2-1/2 weeks ago he had a URI and fever that developed into a lingering cough, chest congestion, and progressive dyspnea. He went to see his PCP one week ago and got Levaquin and cough syrup which did not help. Now in the last week his dyspnea and chest congestion continue and today he developed acute left arm and neck tingling which he now remembers to be identical to his angina from 2009 when he had PCI x2.   In the ED, the patient's ECG was unchanged from his previous and initial troponin was negative. His electrolytes were unremarkable, he was wheezing on exam. His chest x-ray showed no  focal opacity, just interstitial markings similar to 1 week ago when he was started on levofloxacin. He felt better with nebulized bronchodilators, and [facility] were asked to admit for COPD exacerbation.   Hospital course  1. COPD exacerbation:  This is new. The patient has radiographic evidence of structural lung disease, but has never had a flare before nor does he take a maintenance inhaler. His presentation is one of subacute shortness of breath with exertion followed by a URI causing acute and persistent dyspnea and chest congestion with wheezing on exam.  -Prednisone 40 mg for 5 days -Started on Nebulized albuterol-ipratropium once followed by albuterol every four hours and PRN Patient will go home on prednisone 40 mg 5 days as well as albuterol inhaler Symptomatically back to baseline and would like to go home  2. Chest pain:  The patient describes what he remembers is his anginal equivalent. In 2013 he was admitted for chest pain rule out and had a normal Myoview. At that time Dr. Hassell Done note suggests that he had arm tingling which was not anginal but arm discomfort/pain which may have been, but I don't draw that distinction from the , patient denies any numbness or tingling of his left upper extremity or right upper extremity today. His symptoms have resolved Telemetry uneventful, cardiac enzymes negative, 2-D echo  ordered and pending, patient did like to be discharged and follow-up with his outpatient cardiologist   3. HTN and coronary disease:  Continue home metoprolol and aspirin and statin.  4. GERD:  Stable -GI cocktail if needed and continue home H2RA  5. Seizure disorder:  Stable. No report of seizure like activity on admission. This is presumably from his previous AVM. -Continue home Trileptal      Discharge Exam:   Blood pressure 143/72, pulse 88, temperature 97.7 F (36.5 C), temperature source Oral, resp. rate 18, SpO2 95 %. Eyes: Slightly icteric, left  exotropia at rest, small left conjunctival hemorrhage.  ENT: No nasal deformity, discharge, or epistaxis. OP moist without lesions.  Skin: Warm and dry. No jaundice. No suspicious rashes or lesions. Cardiac: RRR, nl S1-S2, no murmurs appreciated. Capillary refill is brisk. JVP normal. No LE edema. Radial pulses 2+ and symmetric. Respiratory: Normal respiratory rate and rhythm. Coughs frequently. Tight breath sounds with bilateral wheezes diffuse without rales. Abdomen: Abdomen soft without rigidity. No TTP.No ascites, distension.  MSK: No deformities or effusions. Neuro: Sensorium intact and responding to questions, attention normal. Speech is fluent. Moves all extremities equally and with normal coordination.  Psych: Behavior appropriate. Affect normal. No evidence of aural or visual hallucinations or delusions.       Follow-up Information    Follow up with MCGOWEN,PHILIP H, MD. Schedule an appointment as soon as possible for a visit in 3 days.   Specialty:  Family Medicine   Contact information:   [id number] [place] [address] [phone]       Signed: Reyne Dumas 12/08/2014, 10:51 AM        Time spent >45 mins

## 2014-12-08 NOTE — Progress Notes (Signed)
Echocardiogram 2D Echocardiogram has been performed.  Peter Barnett 12/08/2014, 12:32 PM

## 2014-12-09 ENCOUNTER — Telehealth: Payer: Self-pay | Admitting: Family Medicine

## 2014-12-09 ENCOUNTER — Encounter: Payer: Self-pay | Admitting: Family Medicine

## 2014-12-09 NOTE — Telephone Encounter (Signed)
Patient never picked up Rx for [facility]. He is headed to the drug store now & wants to know if he should go ahead & get the Rx now. He can be reached until 10:45. Please advise

## 2014-12-09 NOTE — Telephone Encounter (Signed)
Transition Care Management Follow-up Telephone Call   Date discharged? 12/08/2014   How have you been since you were released from the hospital? Feeling better   Do you understand why you were in the hospital? yes   Do you understand the discharge instructions? yes   Where were you discharged to? home   Items Reviewed:  Medications reviewed: yes  Allergies reviewed: yes  Dietary changes reviewed: no  Referrals reviewed: no   Functional Questionnaire:   Activities of Daily Living (ADLs):   He states they are independent in the following: none States they require assistance with the following: none    Any transportation issues/concerns?: no   Any patient concerns? no   Confirmed importance and date/time of follow-up visits scheduled yes  Provider Appointment booked with 12/11/14 @ 10:30am.  Confirmed with patient if condition begins to worsen call PCP or go to the ER.  Patient was given the office number and encouraged to call back with question or concerns.  : yes

## 2014-12-09 NOTE — Telephone Encounter (Signed)
No abx needed at this time.

## 2014-12-09 NOTE — Discharge Summary (Signed)
Physician Discharge Summary  Peter Barnett MRN: [id number] DOB/AGE: Sep 07, 1960 54 y.o.  PCP: Jeoffrey Massed, MD   Admit date: 12/07/2014 Discharge date: 12/09/2014  Discharge Diagnoses:   Principal Problem:   COPD exacerbation (HCC) Active Problems:   Seizures (HCC)   HTN (hypertension), benign   Hyperlipidemia   Coronary atherosclerosis of native coronary artery    Follow-up recommendations Follow-up with PCP in 3-5 days , including all  additional recommended appointments as below Follow-up CBC, CMP in 3-5 days Patient to follow-up with his cardiologist Corky Crafts, MD in 1-2 weeks     Medication List    STOP taking these medications        aspirin 325 MG tablet  Replaced by:  aspirin EC 81 MG tablet      TAKE these medications        acetaminophen 500 MG tablet  Commonly known as:  TYLENOL  Take 1,000 mg by mouth every 6 (six) hours as needed for headache.     albuterol 108 (90 BASE) MCG/ACT inhaler  Commonly known as:  PROVENTIL HFA;VENTOLIN HFA  Inhale 2 puffs into the lungs every 6 (six) hours as needed for wheezing or shortness of breath.     aspirin EC 81 MG tablet  Take 1 tablet (81 mg total) by mouth daily.     benzonatate 200 MG capsule  Commonly known as:  TESSALON  Take 1 capsule (200 mg total) by mouth 2 (two) times daily as needed for cough.     docusate sodium 100 MG capsule  Commonly known as:  COLACE  Take 100 mg by mouth 2 (two) times daily.     famotidine 20 MG tablet  Commonly known as:  PEPCID  Take 20 mg by mouth daily.     HYDROcodone-acetaminophen 10-325 MG tablet  Commonly known as:  NORCO  Take 1 tablet by mouth every 6 (six) hours as needed (pain).     HYDROcodone-homatropine 5-1.5 MG/5ML syrup  Commonly known as:  HYDROMET  Take 5 mLs by mouth every 8 (eight) hours as needed for cough.     metoprolol tartrate 25 MG tablet  Commonly known as:  LOPRESSOR  TAKE 1/2 TABLET BY MOUTH TWICE DAILY     nitroGLYCERIN 0.4 MG SL tablet  Commonly known as:  NITROSTAT  Place 1 tablet (0.4 mg total) under the tongue every 5 (five) minutes as needed for chest pain (CP or SOB).     oxcarbazepine 600 MG tablet  Commonly known as:  TRILEPTAL  Take 1 tablet (600 mg total) by mouth 3 (three) times daily.     PA VITAMIN D-3 2000 UNITS Caps  Generic drug:  Cholecalciferol  Take 2,000 Units by mouth daily.     predniSONE 20 MG tablet  Commonly known as:  DELTASONE  Take 2 tablets (40 mg total) by mouth daily with breakfast.     simvastatin 40 MG tablet  Commonly known as:  ZOCOR  Take 40 mg by mouth daily with supper.         Discharge Condition: *Stable Discharge Instructions   Discharge Instructions    Diet - low sodium heart healthy    Complete by:  As directed      Increase activity slowly    Complete by:  As directed            No Known Allergies    Disposition: 01-Home or Self Care   Consults: None    Significant Diagnostic Studies:  Dg Chest 2 View  12/07/2014  CLINICAL DATA:  Tingling in arm/legs, cough EXAM: CHEST  2 VIEW COMPARISON:  11/28/2014 FINDINGS: Mild bibasilar opacities, likely atelectasis. No focal consolidation. No pleural effusion or pneumothorax. The heart is normal in size. Mild degenerative changes of the visualized thoracolumbar spine. IMPRESSION: Mild bibasilar opacities, likely atelectasis. No evidence of acute cardiopulmonary disease. Electronically Signed   By: Julian Hy M.D.   On: 12/07/2014 18:23   Dg Chest 2 View  11/28/2014  CLINICAL DATA:  Cough and fever for 1 week EXAM: CHEST - 2 VIEW COMPARISON:  10/07/2011 FINDINGS: Cardiac shadow is within normal limits. The lungs are well-aerated without focal infiltrate or sizable effusion. Nipple shadow is noted on the right over the anterior aspect of the right sixth rib. No acute bony abnormality is seen. IMPRESSION: No acute abnormality noted. Electronically Signed   By: [name]  M.D.   On: 11/28/2014 16:46    2-D echo pending   There were no vitals filed for this visit.   Microbiology: No results found for this or any previous visit (from the past 240 hour(s)).     Blood Culture    Component Value Date/Time   SDES BLOOD LEFT HAND 04/28/2013 2200   SPECREQUEST  04/28/2013 2200    BOTTLES DRAWN AEROBIC AND ANAEROBIC AER 5cc ANA 4cc   CULT  04/28/2013 2200    NO GROWTH 5 DAYS Performed at [facility] 05/05/2013 FINAL 04/28/2013 2200      Labs: Results for orders placed or performed during the hospital encounter of 12/07/14 (from the past 48 hour(s))  I-stat troponin, ED     Status: None   Collection Time: 12/07/14  5:50 PM  Result Value Ref Range   Troponin i, poc 0.00 0.00 - 0.08 ng/mL   Comment 3            Comment: Due to the release kinetics of cTnI, a negative result within the first hours of the onset of symptoms does not rule out myocardial infarction with certainty. If myocardial infarction is still suspected, repeat the test at appropriate intervals.   I-stat chem 8, ed     Status: Abnormal   Collection Time: 12/07/14  5:52 PM  Result Value Ref Range   Sodium 137 135 - 145 mmol/L   Potassium 3.7 3.5 - 5.1 mmol/L   Chloride 98 (L) 101 - 111 mmol/L   BUN 10 6 - 20 mg/dL   Creatinine, Ser 1.00 0.61 - 1.24 mg/dL   Glucose, Bld 134 (H) 65 - 99 mg/dL   Calcium, Ion 1.08 (L) 1.12 - 1.23 mmol/L   TCO2 23 0 - 100 mmol/L   Hemoglobin 15.6 13.0 - 17.0 g/dL   HCT 46.0 39.0 - 52.0 %  Troponin I-serum (0, 3, 6 hours)     Status: None   Collection Time: 12/07/14  9:59 PM  Result Value Ref Range   Troponin I <0.03 <0.031 ng/mL    Comment:        NO INDICATION OF MYOCARDIAL INJURY.   Troponin I-serum (0, 3, 6 hours)     Status: None   Collection Time: 12/07/14 11:26 PM  Result Value Ref Range   Troponin I <0.03 <0.031 ng/mL    Comment:        NO INDICATION OF MYOCARDIAL INJURY.   Troponin I-serum (0, 3, 6  hours)     Status: None   Collection Time: 12/08/14  2:54 AM  Result Value Ref Range   Troponin I <0.03 <0.031 ng/mL    Comment:        NO INDICATION OF MYOCARDIAL INJURY.   Comprehensive metabolic panel     Status: Abnormal   Collection Time: 12/08/14 11:40 AM  Result Value Ref Range   Sodium 136 135 - 145 mmol/L   Potassium 4.4 3.5 - 5.1 mmol/L   Chloride 105 101 - 111 mmol/L   CO2 20 (L) 22 - 32 mmol/L   Glucose, Bld 159 (H) 65 - 99 mg/dL   BUN 8 6 - 20 mg/dL   Creatinine, Ser 0.81 0.61 - 1.24 mg/dL   Calcium 9.3 8.9 - 10.3 mg/dL   Total Protein 6.5 6.5 - 8.1 g/dL   Albumin 3.5 3.5 - 5.0 g/dL   AST 23 15 - 41 U/L   ALT 26 17 - 63 U/L   Alkaline Phosphatase 81 38 - 126 U/L   Total Bilirubin 0.4 0.3 - 1.2 mg/dL   GFR calc non Af Amer >60 >60 mL/min   GFR calc Af Amer >60 >60 mL/min    Comment: (NOTE) The eGFR has been calculated using the CKD EPI equation. This calculation has not been validated in all clinical situations. eGFR's persistently <60 mL/min signify possible Chronic Kidney Disease.    Anion gap 11 5 - 15  Magnesium     Status: None   Collection Time: 12/08/14 11:40 AM  Result Value Ref Range   Magnesium 2.1 1.7 - 2.4 mg/dL     Lipid Panel     Component Value Date/Time   CHOL 158 10/31/2013 0943   TRIG 128.0 10/31/2013 0943   HDL 37.80* 10/31/2013 0943   CHOLHDL 4 10/31/2013 0943   VLDL 25.6 10/31/2013 0943   LDLCALC 95 10/31/2013 0943     Lab Results  Component Value Date   HGBA1C 5.8 10/31/2013   HGBA1C  09/19/2007    5.4 (NOTE)   The ADA recommends the following therapeutic goal for glycemic   control related to Hgb A1C measurement:   Goal of Therapy:   < 7.0% Hgb A1C   Reference: American Diabetes Association: Clinical Practice   Recommendations 2008, Diabetes Care,  2008, 31:(Suppl 1).     Lab Results  Component Value Date   LDLCALC 95 10/31/2013   CREATININE 0.81 12/08/2014     HPI Kaya Klausing Monty is a 54 y.o. 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  To be clear the patient is a long-time smoker who was noted to have CT findings of emphysema on a lung cancer screening CT 1 year ago but has never had PFTs or needed bronchodilators or had a COPD flare. However, his wife notes that he has been increasingly dyspneic with exertion over the last several months.   Then around 2-1/2 weeks ago he had a URI and fever that developed into a lingering cough, chest congestion, and progressive dyspnea. He went to see his PCP one week ago and got Levaquin and cough syrup which did not help. Now in the last week his dyspnea and chest congestion continue and today he developed acute left arm and neck tingling which he now remembers to be identical to his angina from 2009 when he  had PCI x2.   In the ED, the patient's ECG was unchanged from his previous and initial troponin was negative. His electrolytes were unremarkable, he was wheezing on exam. His chest x-ray showed no focal opacity, just interstitial markings similar to 1 week ago when he was started on levofloxacin. He felt better with nebulized bronchodilators, and [facility] were asked to admit for COPD exacerbation.   Hospital course  1. COPD exacerbation:  This is new. The patient has radiographic evidence of structural lung disease, but has never had a flare before nor does he take a maintenance inhaler. His presentation is one of subacute shortness of breath with exertion followed by a URI causing acute and persistent dyspnea and chest congestion with wheezing on exam.  -Prednisone 40 mg for 5 days -Started on Nebulized albuterol-ipratropium once followed by albuterol every four hours and PRN Patient will go home on prednisone  40 mg 5 days as well as albuterol inhaler Symptomatically back to baseline and would like to go home  2. Chest pain:  The patient describes what he remembers is his anginal equivalent. In 2013 he was admitted for chest pain rule out and had a normal Myoview. At that time Dr. Hassell Done note suggests that he had arm tingling which was not anginal but arm discomfort/pain which may have been, but I don't draw that distinction from the , patient denies any numbness or tingling of his left upper extremity or right upper extremity today. His symptoms have resolved Telemetry uneventful, cardiac enzymes negative, 2-D echo ordered and pending, patient did like to be discharged and follow-up with his outpatient cardiologist   3. HTN and coronary disease:  Continue home metoprolol and aspirin and statin.  4. GERD:  Stable -GI cocktail if needed and continue home H2RA  5. Seizure disorder:  Stable. No report of seizure like activity on admission. This is presumably from his previous AVM. -Continue home Trileptal      Discharge Exam:   Blood pressure 143/72, pulse 88, temperature 97.7 F (36.5 C), temperature source Oral, resp. rate 18, SpO2 95 %. Eyes: Slightly icteric, left exotropia at rest, small left conjunctival hemorrhage.  ENT: No nasal deformity, discharge, or epistaxis. OP moist without lesions.  Skin: Warm and dry. No jaundice. No suspicious rashes or lesions. Cardiac: RRR, nl S1-S2, no murmurs appreciated. Capillary refill is brisk. JVP normal. No LE edema. Radial pulses 2+ and symmetric. Respiratory: Normal respiratory rate and rhythm. Coughs frequently. Tight breath sounds with bilateral wheezes diffuse without rales. Abdomen: Abdomen soft without rigidity. No TTP.No ascites, distension.  MSK: No deformities or effusions. Neuro: Sensorium intact and responding to questions, attention normal. Speech is fluent. Moves all extremities equally and with normal  coordination.  Psych: Behavior appropriate. Affect normal. No evidence of aural or visual hallucinations or delusions.       Follow-up Information    Follow up with MCGOWEN,PHILIP H, MD. Schedule an appointment as soon as possible for a visit in 3 days.   Specialty:  Family Medicine   Contact information:   [id number] [place] [address] [phone]       Signed: Reyne Dumas 12/09/2014, 8:42 AM        Time spent >45 mins

## 2014-12-09 NOTE — Telephone Encounter (Signed)
Spoke to pt and his wife Peter Barnett. She stated that pt only took the Levaquin and did not start the zpack. She stated that when he was d/c from the hospital they just gave him an inhaler and told him to f/u with his PCP. She wants to know if pt should be taking any antibiotics. Please advise. Thanks.

## 2014-12-09 NOTE — Telephone Encounter (Signed)
Pt advised and voiced understanding.   

## 2014-12-10 DIAGNOSIS — I639 Cerebral infarction, unspecified: Secondary | ICD-10-CM

## 2014-12-10 HISTORY — DX: Cerebral infarction, unspecified: I63.9

## 2014-12-11 ENCOUNTER — Encounter: Payer: Self-pay | Admitting: Family Medicine

## 2014-12-11 ENCOUNTER — Ambulatory Visit (INDEPENDENT_AMBULATORY_CARE_PROVIDER_SITE_OTHER): Payer: Medicare HMO | Admitting: Family Medicine

## 2014-12-11 VITALS — BP 135/87 | HR 60 | Temp 98.5°F | Resp 16 | Ht 71.0 in | Wt 241.0 lb

## 2014-12-11 DIAGNOSIS — R202 Paresthesia of skin: Secondary | ICD-10-CM

## 2014-12-11 DIAGNOSIS — J438 Other emphysema: Secondary | ICD-10-CM

## 2014-12-11 DIAGNOSIS — R209 Unspecified disturbances of skin sensation: Secondary | ICD-10-CM

## 2014-12-11 DIAGNOSIS — Z87891 Personal history of nicotine dependence: Secondary | ICD-10-CM

## 2014-12-11 DIAGNOSIS — J441 Chronic obstructive pulmonary disease with (acute) exacerbation: Secondary | ICD-10-CM | POA: Diagnosis not present

## 2014-12-11 MED ORDER — PREDNISONE 20 MG PO TABS: ORAL_TABLET | ORAL | Status: DC

## 2014-12-11 NOTE — Progress Notes (Signed)
OFFICE VISIT  12/11/2014   CC:  Chief Complaint  Patient presents with  . Hospitalization Follow-up    COPD   HPI:    Patient is a 54 y.o. Caucasian male who presents for hospital f/u, COPD exacerbation. Hospitalized 10/29-10/31, 2016.  He ruled out for MI, echo was stable, CXR showed bibasilar atelectasis but no infiltrate, lab work unremarkable.  He felt better with steroids and bronchodilators and was able to ween off oxygen.. ASA dose changed to 81mg  qd at d/c, went home on prednisone to finish a 5d burst of 40mg  qd and albuterol inhaler. He had finished a 7d course of levaquin prior to/during hospitalization.  Feeling better, but still with signif cough and intermittent SOB and wheezing.  Using alb inhaler 2-3 times per day and gets some relief.  No fevers since d/c.    He had complained of gradually worsening DOE for months before the latest acute illness, has never been on any COPD med or had PFTs. Has had CT changes c/w COPD on lung ca screening CT.  Has never seen pulmonologist.  He also complains of subtle numbness/tingling sensation in area of left lower cheek last few weeks, also similar feeling intermittently in left arm.  Denies weakness, pain, dysarthria, dysphagia, or any similar feeling in lower extremities.  No vision complaints.    Past Medical History  Diagnosis Date  . HTN (hypertension)   . CAD (coronary artery disease)     Premature; stents + known distal RCA/PDA occlusion  . MI (myocardial infarction) ([facility]) 2009    "just had arm pain; never any chest pain"  . Exertional dyspnea 10/07/2011  . 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 Headache(784.0) 10/07/2011     a couple per month  . Pneumonia ~ 2010    "2 years in a row; May both times"  . 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 Chronic lower back pain     Still with recurrent left sided LBP with paresthesias down left leg --primarily with activities: pain pill use is  avg <90 tabs per mo  . Urethral stricture since 1981    Recurrent dilation has been required [facility] urology)  . History of adenomatous polyp of colon 02/2009;02/2014    Recall 5 yrs ([facility])  . COPD (chronic obstructive pulmonary disease) ([facility])     NOTED on CT chest 11/2013 done for lung ca screening.  Hosp for acute exac 11/2014.  Marland Kitchen Hyperlipidemia   . Nephrolithiasis 03/2014    Bilateral (27mm) nonobstructing  . Tobacco abuse     Past Surgical History  Procedure Laterality Date  . Craniotomy for avm  1981    Dx'd after pt began having seizures.  . Tonsillectomy      "I was little"  . Lumbar disc surgery  2008; 2009    Dr. Joya Salm  . Coronary angioplasty with stent placement  2009    3 DES to LCx; still with known distal RCA/PDA occlusion.  EF 60% by LV-gram.  . Colonoscopy w/ polypectomy  02/2009;02/2014    Recall 5 yr (aden poly, hyperplast polyp, int and ext hemorr).  Next recall is 02/2019.  . Cardiac catheterization  2011    [facility], records unavailable: pt reports "normal".  . Transthoracic echocardiogram  12/07/14    Normal LV size/fxn, EF 60%, normal valves.   Social History   Social History Narrative   Patient lives at home with his wife [name]).  Two adult children.   Retired from [employer] approx age 9 due to medical reasons (disabled due to memory issues, seizure d/o and chronic LBP).   Education 10th grade.   Right handed.   Caffeine two cups of tea and two cups of coffee.   Tobacco 45 pack-yr hx, quit approx age 81.  Alcohol: 1-2 beers per night.     No hx of alc or drug problems.   No exercise.   Diet: regular    Outpatient Prescriptions Prior to Visit  Medication Sig Dispense Refill  . acetaminophen (TYLENOL) 500 MG tablet Take 1,000 mg by mouth every 6 (six) hours as needed for headache.    . albuterol (PROVENTIL HFA;VENTOLIN HFA) 108 (90 BASE) MCG/ACT inhaler Inhale 2 puffs into the lungs every 6 (six) hours as needed for wheezing or  shortness of breath. 1 Inhaler 2  . aspirin EC 81 MG tablet Take 1 tablet (81 mg total) by mouth daily. 60 tablet 0  . Cholecalciferol (PA VITAMIN D-3) 2000 UNITS CAPS Take 2,000 Units by mouth daily.     Marland Kitchen docusate sodium (COLACE) 100 MG capsule Take 100 mg by mouth 2 (two) times daily.    . famotidine (PEPCID) 20 MG tablet Take 20 mg by mouth daily.    Marland Kitchen HYDROcodone-acetaminophen (NORCO) 10-325 MG per tablet Take 1 tablet by mouth every 6 (six) hours as needed (pain).     Marland Kitchen HYDROcodone-homatropine (HYDROMET) 5-1.5 MG/5ML syrup Take 5 mLs by mouth every 8 (eight) hours as needed for cough. 120 mL 0  . metoprolol tartrate (LOPRESSOR) 25 MG tablet TAKE 1/2 TABLET BY MOUTH TWICE DAILY 30 tablet 3  . nitroGLYCERIN (NITROSTAT) 0.4 MG SL tablet Place 1 tablet (0.4 mg total) under the tongue every 5 (five) minutes as needed for chest pain (CP or SOB). (Patient taking differently: Place 0.4 mg under the tongue every 5 (five) minutes as needed (chest pain or shortness of breath). ) 25 tablet 3  . oxcarbazepine (TRILEPTAL) 600 MG tablet Take 1 tablet (600 mg total) by mouth 3 (three) times daily. 90 tablet 11  . simvastatin (ZOCOR) 40 MG tablet Take 40 mg by mouth daily with supper.     . predniSONE (DELTASONE) 20 MG tablet Take 2 tablets (40 mg total) by mouth daily with breakfast. 5 tablet 0  . benzonatate (TESSALON) 200 MG capsule Take 1 capsule (200 mg total) by mouth 2 (two) times daily as needed for cough. (Patient not taking: Reported on 12/11/2014) 20 capsule 0   No facility-administered medications prior to visit.    No Known Allergies  ROS As per HPI  PE: Blood pressure 135/87, pulse 60, temperature 98.5 F (36.9 C), temperature source Oral, resp. rate 16, height 5\' 11"  (1.803 m), weight 241 lb (109.317 kg), SpO2 93 %. Gen: Alert, well appearing.  Patient is oriented to person, place, time, and situation. JXB:JYNW: no injection, icteris, swelling, or exudate.  EOMI, PERRLA. Mouth: lips  without lesion/swelling.  Oral mucosa pink and moist. Oropharynx without erythema, exudate, or swelling.  CV: RRR, no m/r/g LUNGS: no crackles.  He has diffuse bilat coarse exp wheezing and lots of post-exhalation coughing.  Breathing is nonlabored. ABD: soft, NT/ND EXT: no c/c/e Neuro: CN 2-12 intact bilaterally, strength 5/5 in proximal and distal upper extremities and lower extremities bilaterally.  No sensory deficits except he has mild decreased fine touch sensation in L lower cheek area.  No tremor.  No disdiadochokinesis.  No ataxia.  Upper extremity and  lower extremity DTRs symmetric.  No pronator drift.   LABS:    Chemistry      Component Value Date/Time   NA 136 12/08/2014 1140   NA 134 08/09/2013 1127   K 4.4 12/08/2014 1140   CL 105 12/08/2014 1140   CO2 20* 12/08/2014 1140   BUN 8 12/08/2014 1140   BUN 10 08/09/2013 1127   CREATININE 0.81 12/08/2014 1140   CREATININE 0.91 12/05/2014 1039      Component Value Date/Time   CALCIUM 9.3 12/08/2014 1140   ALKPHOS 81 12/08/2014 1140   AST 23 12/08/2014 1140   ALT 26 12/08/2014 1140   BILITOT 0.4 12/08/2014 1140     Lab Results  Component Value Date   WBC 10.3 03/28/2014   HGB 15.6 12/07/2014   HCT 46.0 12/07/2014   MCV 88.8 03/28/2014   PLT 228.0 03/28/2014   Lab Results  Component Value Date   CKTOTAL 73 10/08/2011   CKMB 1.9 10/08/2011   TROPONINI <0.03 12/08/2014     IMPRESSION AND PLAN:  1) COPD exacerbation.  He is slightly improved but needs more steroids. Prednisone 40mg  qd x 5d, then 20mg  qd x 5d, then 10mg  qd x 6d.  Continue q4h albuterol HFA prn. Will order referral to pulmonologist for further eval regarding new dx COPD.  2) Hx of tob abuse, lung cancer screening candidate.   He last had screening CT (his initial one) a year ago, so I'll order another now.  3) Paresthesias; left lower face and left arm. Looking back in his problem list, the left arm paresthesias was a dx back in 2013. I  reassured pt.  This may be an after-effect of his prior AVM repair. I recommended he make a f/u appt with his neurologist to go over these symptoms.  Medical decision making of high complexity used for this visit.  FOLLOW UP: Return in about 3 weeks (around 01/01/2015).

## 2014-12-11 NOTE — Progress Notes (Signed)
Pre visit review using our clinic review tool, if applicable. No additional management support is needed unless otherwise documented below in the visit note. 

## 2014-12-20 ENCOUNTER — Ambulatory Visit (HOSPITAL_BASED_OUTPATIENT_CLINIC_OR_DEPARTMENT_OTHER)
Admission: RE | Admit: 2014-12-20 | Discharge: 2014-12-20 | Disposition: A | Discharge status: Home / Self Care | Payer: Medicare HMO | Source: Ambulatory Visit | Attending: Family Medicine | Admitting: Family Medicine

## 2014-12-20 DIAGNOSIS — Z87891 Personal history of nicotine dependence: Secondary | ICD-10-CM | POA: Insufficient documentation

## 2014-12-20 DIAGNOSIS — Z122 Encounter for screening for malignant neoplasm of respiratory organs: Secondary | ICD-10-CM | POA: Diagnosis not present

## 2014-12-20 DIAGNOSIS — R918 Other nonspecific abnormal finding of lung field: Secondary | ICD-10-CM | POA: Diagnosis not present

## 2014-12-24 ENCOUNTER — Encounter (HOSPITAL_COMMUNITY): Payer: Self-pay | Admitting: Emergency Medicine

## 2014-12-24 ENCOUNTER — Emergency Department (HOSPITAL_COMMUNITY)
Admission: EM | Admit: 2014-12-24 | Discharge: 2014-12-24 | Disposition: A | Discharge status: Home / Self Care | Payer: Medicare HMO | Source: Home / Self Care | Attending: Emergency Medicine | Admitting: Emergency Medicine

## 2014-12-24 DIAGNOSIS — I252 Old myocardial infarction: Secondary | ICD-10-CM | POA: Insufficient documentation

## 2014-12-24 DIAGNOSIS — Z87891 Personal history of nicotine dependence: Secondary | ICD-10-CM | POA: Insufficient documentation

## 2014-12-24 DIAGNOSIS — Z87442 Personal history of urinary calculi: Secondary | ICD-10-CM | POA: Insufficient documentation

## 2014-12-24 DIAGNOSIS — I2510 Atherosclerotic heart disease of native coronary artery without angina pectoris: Secondary | ICD-10-CM | POA: Insufficient documentation

## 2014-12-24 DIAGNOSIS — J449 Chronic obstructive pulmonary disease, unspecified: Secondary | ICD-10-CM

## 2014-12-24 DIAGNOSIS — G8929 Other chronic pain: Secondary | ICD-10-CM | POA: Insufficient documentation

## 2014-12-24 DIAGNOSIS — Z79899 Other long term (current) drug therapy: Secondary | ICD-10-CM | POA: Insufficient documentation

## 2014-12-24 DIAGNOSIS — Z9861 Coronary angioplasty status: Secondary | ICD-10-CM

## 2014-12-24 DIAGNOSIS — G40909 Epilepsy, unspecified, not intractable, without status epilepticus: Secondary | ICD-10-CM | POA: Diagnosis not present

## 2014-12-24 DIAGNOSIS — Z86018 Personal history of other benign neoplasm: Secondary | ICD-10-CM | POA: Insufficient documentation

## 2014-12-24 DIAGNOSIS — Z87448 Personal history of other diseases of urinary system: Secondary | ICD-10-CM | POA: Insufficient documentation

## 2014-12-24 DIAGNOSIS — I10 Essential (primary) hypertension: Secondary | ICD-10-CM | POA: Insufficient documentation

## 2014-12-24 DIAGNOSIS — Z9889 Other specified postprocedural states: Secondary | ICD-10-CM | POA: Insufficient documentation

## 2014-12-24 DIAGNOSIS — Z8701 Personal history of pneumonia (recurrent): Secondary | ICD-10-CM

## 2014-12-24 DIAGNOSIS — R569 Unspecified convulsions: Secondary | ICD-10-CM | POA: Insufficient documentation

## 2014-12-24 DIAGNOSIS — Z7982 Long term (current) use of aspirin: Secondary | ICD-10-CM | POA: Insufficient documentation

## 2014-12-24 DIAGNOSIS — I611 Nontraumatic intracerebral hemorrhage in hemisphere, cortical: Secondary | ICD-10-CM | POA: Diagnosis not present

## 2014-12-24 NOTE — ED Provider Notes (Signed)
CSN: [id number]     Arrival date & time 12/24/14  1836 History   First MD Initiated Contact with Patient 12/24/14 1942     Chief Complaint  Patient presents with  . Seizures     (Consider location/radiation/quality/duration/timing/severity/associated sxs/prior Treatment) HPI Comments: Peter Barn[place] is a 54 y.o. male with a PMHx of petit mal seizures, HTN, CAD, MI, short-term memory loss, chronic headaches, chronic lower back pain, COPD, HLD, nephrolithiasis, and tobacco use, with a PSHx of craniotomy for AVM in 1981 who presents to the ED with complaints of seizure-like activity that occurred around 5:30 PM. Patient states that his symptoms were left arm shaking and head shaking, lasting approximately 5 minutes, with no postictal state, no prodromal symptoms, and with no memory loss or somnolence. He states initially he had a headache which resolved prior to arrival. He states that this seizure was different than his typical seizures given that typically he has staring episodes when he has seizures, but that he has had shaking in the remote past with seizures. He states in July he was decreased on his Trileptal, which he is compliant with taking 600 mg twice a day. [facility]. All symptoms have resolved prior to arrival. He did not take any medications PTA.   He denies any ongoing headache, vision changes, numbness, tingling, focal weakness, fevers, chills, chest pain, shortness breath, URI symptoms, dizziness, lightheadedness, abdominal pain, nausea, vomiting, diarrhea, constipation, dysuria, hematuria, syncope, illicit drug use, or any other symptoms. He admits to drinking one beer daily, last beer was last night.  Patient is a 53 y.o. male presenting with seizures. The history is provided by the patient and medical records. No language interpreter was used.  Seizures Seizure activity on arrival: no   Seizure type:  Myoclonic Preceding symptoms: no sensation of an  aura present, no headache, no numbness and no vision change   Initial focality:  Upper extremity Episode characteristics: focal shaking   Episode characteristics: no confusion, no disorientation, no incontinence, fully responsive, no stiffening and responsive   Postictal symptoms: no confusion, no memory loss and no somnolence   Return to baseline: yes   Severity:  Mild Duration:  5 minutes Timing:  Once Number of seizures this episode:  1 Progression:  Resolved Context: change in medication   Recent head injury:  No recent head injuries PTA treatment:  None History of seizures: yes     Past Medical History  Diagnosis Date  . HTN (hypertension)   . CAD (coronary artery disease)     Premature; stents + known distal RCA/PDA occlusion  . MI (myocardial infarction) ([place]) 2009    "just had arm pain; never any chest pain"  . Exertional dyspnea 10/07/2011  . 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 Headache(784.0) 10/07/2011     a couple per month  . Pneumonia ~ 2010    "2 years in a row; May both times"  . Petit-mal epilepsy ([place]) 1981-present  . Chronic lower back pain     Still with recurrent left sided LBP with paresthesias down left leg --primarily with activities: pain pill use is avg <90 tabs per mo  . Urethral stricture since 1981    Recurrent dilation has been required [facility] urology)  . History of adenomatous polyp of colon 02/2009;02/2014    Recall 5 yrs ([facility])  . COPD (chronic obstructive pulmonary disease) ([place])  NOTED on CT chest 11/2013 done for lung ca screening.  Hosp for acute exac 11/2014.  Marland Kitchen Hyperlipidemia   . Nephrolithiasis 03/2014    Bilateral (33mm) nonobstructing  . Tobacco abuse    Past Surgical History  Procedure Laterality Date  . Craniotomy for avm  1981    Dx'd after pt began having seizures.  . Tonsillectomy      "I was little"  . Lumbar disc surgery  2008; 2009    Dr. Joya Salm  .  Coronary angioplasty with stent placement  2009    3 DES to LCx; still with known distal RCA/PDA occlusion.  EF 60% by LV-gram.  . Colonoscopy w/ polypectomy  02/2009;02/2014    Recall 5 yr (aden poly, hyperplast polyp, int and ext hemorr).  Next recall is 02/2019.  . Cardiac catheterization  2011    [facility], records unavailable: pt reports "normal".  . Transthoracic echocardiogram  12/07/14    Normal LV size/fxn, EF 60%, normal valves.   Family History  Problem Relation Age of Onset  . Hyperlipidemia Mother   . Cancer Father     Mesothelioma  . Hypertension Father   . Stroke Neg Hx   . Heart attack Paternal Uncle     In 27s too   Social History  Substance Use Topics  . Smoking status: Former Smoker -- 3.00 packs/day for 27 years    Types: Cigarettes    Quit date: 02/08/2001  . Smokeless tobacco: Never Used  . Alcohol Use: 0.6 oz/week    1 Cans of beer per week     Comment: 3-4 times a week    Review of Systems  Constitutional: Negative for fever and chills.  HENT: Negative for ear discharge, ear pain, rhinorrhea and sore throat.   Eyes: Negative for visual disturbance.  Respiratory: Negative for shortness of breath.   Cardiovascular: Negative for chest pain.  Gastrointestinal: Negative for nausea, vomiting, abdominal pain, diarrhea and constipation.  Genitourinary: Negative for dysuria and hematuria.  Musculoskeletal: Negative for myalgias and arthralgias.  Skin: Negative for color change.  Allergic/Immunologic: Negative for immunocompromised state.  Neurological: Positive for seizures. Negative for dizziness, syncope, weakness, light-headedness, numbness and headaches.  Psychiatric/Behavioral: Negative for confusion.   10 Systems reviewed and are negative for acute change except as noted in the HPI.    Allergies  Review of patient's allergies indicates no known allergies.  Home Medications   Prior to Admission medications   Medication Sig Start Date End Date  Taking? Authorizing Provider  acetaminophen (TYLENOL) 500 MG tablet Take 1,000 mg by mouth every 6 (six) hours as needed for headache.    Historical Provider, MD  albuterol (PROVENTIL HFA;VENTOLIN HFA) 108 (90 BASE) MCG/ACT inhaler Inhale 2 puffs into the lungs every 6 (six) hours as needed for wheezing or shortness of breath. 12/08/14   Reyne Dumas, MD  aspirin EC 81 MG tablet Take 1 tablet (81 mg total) by mouth daily. 12/08/14   Reyne Dumas, MD  Cholecalciferol (PA VITAMIN D-3) 2000 UNITS CAPS Take 2,000 Units by mouth daily.     Historical Provider, MD  docusate sodium (COLACE) 100 MG capsule Take 100 mg by mouth 2 (two) times daily.    Historical Provider, MD  famotidine (PEPCID) 20 MG tablet Take 20 mg by mouth daily.    Historical Provider, MD  HYDROcodone-acetaminophen (NORCO) 10-325 MG per tablet Take 1 tablet by mouth every 6 (six) hours as needed (pain).  08/07/14   Historical Provider, MD  HYDROcodone-homatropine (  HYDROMET) 5-1.5 MG/5ML syrup Take 5 mLs by mouth every 8 (eight) hours as needed for cough. 12/08/14   Reyne Dumas, MD  metoprolol tartrate (LOPRESSOR) 25 MG tablet TAKE 1/2 TABLET BY MOUTH TWICE DAILY 09/04/14   Tammi Sou, MD  nitroGLYCERIN (NITROSTAT) 0.4 MG SL tablet Place 1 tablet (0.4 mg total) under the tongue every 5 (five) minutes as needed for chest pain (CP or SOB). Patient taking differently: Place 0.4 mg under the tongue every 5 (five) minutes as needed (chest pain or shortness of breath).  12/05/14 02/01/18  Jettie Booze, MD  oxcarbazepine (TRILEPTAL) 600 MG tablet Take 1 tablet (600 mg total) by mouth 3 (three) times daily. 08/15/14   Dennie Bible, NP  predniSONE (DELTASONE) 20 MG tablet 2 tabs po qd x 5d, then 1 tab po qd x 5d, then 1/2 tab po qd x 6d 12/11/14   Tammi Sou, MD  simvastatin (ZOCOR) 40 MG tablet Take 40 mg by mouth daily with supper.     Historical Provider, MD   BP 119/79 mmHg  Pulse 73  Temp(Src) 98.9 F (37.2 C)  (Oral)  Resp 21  SpO2 94% Physical Exam  Constitutional: He is oriented to person, place, and time. Vital signs are normal. He appears well-developed and well-nourished.  Non-toxic appearance. No distress.  Afebrile, nontoxic, NAD  HENT:  Head: Normocephalic and atraumatic.  Mouth/Throat: Oropharynx is clear and moist and mucous membranes are normal.  No facial asymmetry, tongue protrusion symmetric, uvula midline  Eyes: Conjunctivae and EOM are normal. Pupils are equal, round, and reactive to light. Right eye exhibits no discharge. Left eye exhibits no discharge.  PERRL, EOMI, no nystagmus, no visual field deficits   Neck: Normal range of motion. Neck supple.  Cardiovascular: Normal rate, regular rhythm, normal heart sounds and intact distal pulses.  Exam reveals no gallop and no friction rub.   No murmur heard. Pulmonary/Chest: Effort normal and breath sounds normal. No respiratory distress. He has no decreased breath sounds. He has no wheezes. He has no rhonchi. He has no rales.  Abdominal: Soft. Normal appearance and bowel sounds are normal. He exhibits no distension. There is no tenderness. There is no rigidity, no rebound and no guarding.  Musculoskeletal: Normal range of motion.  MAE x4 Strength and sensation grossly intact Distal pulses intact Gait steady  Neurological: He is alert and oriented to person, place, and time. He has normal strength. No cranial nerve deficit or sensory deficit. He displays a negative Romberg sign. Coordination and gait normal. GCS eye subscore is 4. GCS verbal subscore is 5. GCS motor subscore is 6.  CN 2-12 grossly intact A&O x4 GCS 15 Sensation and strength intact Gait nonataxic including with tandem walking Coordination with finger-to-nose WNL Neg romberg, neg pronator drift   Skin: Skin is warm, dry and intact. No rash noted.  Psychiatric: He has a normal mood and affect.  Nursing note and vitals reviewed.   ED Course  Procedures (including  critical care time) Labs Review Labs Reviewed - No data to display  Imaging Review No results found. I have personally reviewed and evaluated these images and lab results as part of my medical decision-making.   EKG Interpretation None      MDM   Final diagnoses:  Seizure ([place])  Seizure-like activity ([place])    54 y.o. male here with seizure-like activity prior to arrival which is completely resolved upon arrival. Patient states it is slightly different than his typical  seizure-like activity. No postictal state, no confusion, no altered mental status per family member. No ongoing symptoms. On exam, no focal neuro deficits. No signs or symptoms of infection. Patient was recently decreased on his medicine in July therefore could be that patient needs to have increase in dose. Given normal neuro exam, and close neurology follow-up, I feel patient is stable to be discharged home and follow-up tomorrow with [facility], doubt need for labs or imaging. Offered option of CT scan, which he declined. Discussed case with Dr. Lacinda Axon who agreed with this plan. I explained the diagnosis and have given explicit precautions to return to the ER including for any other new or worsening symptoms. The patient understands and accepts the medical plan as it's been dictated and I have answered their questions. Discharge instructions concerning home care and prescriptions have been given. The patient is STABLE and is discharged to home in good condition.  BP 119/79 mmHg  Pulse 73  Temp(Src) 98.9 F (37.2 C) (Oral)  Resp 21  SpO2 94%  No orders of the defined types were placed in this encounter.       [address] [place], PA-C 12/24/14 2014  Nat Christen, MD 12/27/14 [phone]

## 2014-12-24 NOTE — Discharge Instructions (Signed)
Your seizure like activity could indicate that your medications may need adjustment. Call your neurologist in the morning to schedule follow up tomorrow or the day after. Return to the ER for changes or worsening symptoms, particularly if episodes return and do not stop, or if patient becomes confused.    Seizure, Adult A seizure is abnormal electrical activity in the brain. Seizures usually last from 30 seconds to 2 minutes. There are various types of seizures. Before a seizure, you may have a warning sensation (aura) that a seizure is about to occur. An aura may include the following symptoms:   Fear or anxiety.  Nausea.  Feeling like the room is spinning (vertigo).  Vision changes, such as seeing flashing lights or spots. Common symptoms during a seizure include:  A change in attention or behavior (altered mental status).  Convulsions with rhythmic jerking movements.  Drooling.  Rapid eye movements.  Grunting.  Loss of bladder and bowel control.  Bitter taste in the mouth.  Tongue biting. After a seizure, you may feel confused and sleepy. You may also have an injury resulting from convulsions during the seizure. HOME CARE INSTRUCTIONS   If you are given medicines, take them exactly as prescribed by your health care provider.  Keep all follow-up appointments as directed by your health care provider.  Do not swim or drive or engage in risky activity during which a seizure could cause further injury to you or others until your health care provider says it is OK.  Get adequate rest.  Teach friends and family what to do if you have a seizure. They should:  Lay you on the ground to prevent a fall.  Put a cushion under your head.  Loosen any tight clothing around your neck.  Turn you on your side. If vomiting occurs, this helps keep your airway clear.  Stay with you until you recover.  Know whether or not you need emergency care. SEEK IMMEDIATE MEDICAL CARE  IF:  The seizure lasts longer than 5 minutes.  The seizure is severe or you do not wake up immediately after the seizure.  You have an altered mental status after the seizure.  You are having more frequent or worsening seizures. Someone should drive you to the emergency department or call local emergency services (911 in [place] MAKE SURE YOU:  Understand these instructions.  Will watch your condition.  Will get help right away if you are not doing well or get worse.   This information is not intended to replace advice given to you by your health care provider. Make sure you discuss any questions you have with your health care provider.   Document Released: 01/23/2000 Document Revised: 02/15/2014 Document Reviewed: 09/06/2012 Elsevier Interactive Patient Education [employer].

## 2014-12-24 NOTE — ED Notes (Signed)
Pt sts seizure with left arm and head shaking today; pt sts hx of partial seizures but today was different; pt sts AVM sx in 1981

## 2014-12-25 ENCOUNTER — Encounter: Payer: Self-pay | Admitting: Nurse Practitioner

## 2014-12-25 ENCOUNTER — Telehealth: Payer: Self-pay | Admitting: Neurology

## 2014-12-25 ENCOUNTER — Ambulatory Visit (INDEPENDENT_AMBULATORY_CARE_PROVIDER_SITE_OTHER): Payer: Medicare HMO | Admitting: Nurse Practitioner

## 2014-12-25 VITALS — BP 132/80 | HR 64 | Ht 71.0 in | Wt 241.0 lb

## 2014-12-25 DIAGNOSIS — R208 Other disturbances of skin sensation: Secondary | ICD-10-CM | POA: Diagnosis not present

## 2014-12-25 DIAGNOSIS — R569 Unspecified convulsions: Secondary | ICD-10-CM

## 2014-12-25 DIAGNOSIS — Z5181 Encounter for therapeutic drug level monitoring: Secondary | ICD-10-CM

## 2014-12-25 DIAGNOSIS — Z9889 Other specified postprocedural states: Secondary | ICD-10-CM | POA: Diagnosis not present

## 2014-12-25 DIAGNOSIS — R200 Anesthesia of skin: Secondary | ICD-10-CM

## 2014-12-25 NOTE — Progress Notes (Signed)
[facility]  PATIENT: Peter Barnett DOB: 1960-07-02   REASON FOR VISIT: Follow-up for seizure disorder, seizure occurred yesterday history of craniotomy, memory loss, new left facial numbness HISTORY FROM: Patient and wife    HISTORY OF PRESENT ILLNESS: HISTORY: Peter Barnett is a 54 years old right-handed Caucasian male, accompanied by his wife of 13 years, he was patient's of Dr. Erling Cruz, last clinical visit was in February 2014, he has been followed by our clinic for partial seizure,  He suffered complex partial seizure, in 1981, was diagnosed with right frontal AV malformation, had a right frontal craniotomy by Dr. Lesly Dukes at [facility] in 1981, he seizure has been under good control, but he continued to have occasional staring spells, which was considered due to simple partial or complex partial seizure, over the years, he has tried different medications, Dilantin caused gingiva hypertrophy, Keppra cause mood disorder, Topamax complains of memory trouble,  He has 3 staring spell in 6 months per wife, no body shaking movement. This morning, he woke up noticed his left leg was jumping, there was no post event confusion, this is the first time, he experienced similar complaints, reported previous abnormal EEG  He has been taking oxcarbazepine, currently taking 600 mg 3 times during the daytime, half tablets at nighttime, most recent laboratory evaluation was in April 10 2012, 23, normal range was 10-35,  He could not have MRI of the brain because mental plate at right frontal craniotomy site, he was also seen by Dr. Joya Salm for his chronic low back pain, left lumbar radiculopathy, he continued to complains of shooting pain from his left lower back to his left leg, worsening by prolonged walking, he had 2 lumbar surgery, most recent one was in 2010, this was evaluated by lumbar myelogram. He is receiving some epidural injection which has been beneficial  He had 10  years of education, quit high school because he never was good at school, and did not like school, works at [employer] job all his life, went on disability since 1981 after his right frontal craniotomy, he stays at home, doing household chores, patient stated he never had good memory all his life, wife noticed worsening memory over the past few years, especially over the past 1 year, he forgot why he went to kitchen, he could not get all the items on the list for grocery shopping, after shower, he could not remember whether he has shampooed his hair or not, but he is still driving, not getting lost, but need direction from his wife, wife continued to notice occasionally staring spells, about 3 spells in 6 months, he staring off into space, there was no body jerking movement. There was no family history of memory loss  UPDATE: 08/15/2014 Peter Barnett, 54 year old male returns for yearly follow-up. He has history of seizure disorder with no seizures in several years EEG done 2014 was read as normal. He denies any side effects of Trileptal. Labs to rule out organic causes of memory loss were negative. Has history of craniotomy in 1981. He has been on disability for many years. He has a new complaint today of headache for about 3 weeks in the left occipital area, there is no nausea or vomiting, no photophobia or phonophobia. He also has neck pain. He has been taken Tylenol with some relief. He is due to have back surgery by Dr. Joya Salm in September. He has significant lumbar spinal stenosis according to the patient. Recent labs at primary  care within normal limits, CBC CMP. He returns for reevaluation UPDATE 12/25/2014 Peter Barnett, 54 year old male returns for follow-up. He was last seen in the office 08/15/2014. He had an ER visit in October for chest pain studies negative for MI. Prior to yesterday he had no seizure events in several years. He apparently had some left hand shaking and  head shaking yesterday along with  some left facial numbness. He was seen in the emergency room and told to follow up with [place]. He states that the seizure he had yesterday was different from the ones in the past and that his typical seizure is staring episodes. He did not experience any confusion disorientation and incontinence after the event. He admits to missing several doses of his Trileptal last week on Tuesday and Thursday. He is also decreased his Trileptal dose on his own, from 3 capsules to 2 capsules daily . He did not have a level in the emergency room. He is currently being treated for pneumonia and has several more days of prednisone. He returns for reevaluation  REVIEW OF SYSTEMS: Full 14 system review of systems performed and notable only for those listed, all others are neg:  Constitutional: neg  Cardiovascular: neg Ear/Nose/Throat: neg  Skin: neg Eyes: neg Respiratory: COPD Gastroitestinal: neg  Hematology/Lymphatic: neg  Endocrine: neg Musculoskeletal: Back pain Allergy/Immunology: neg Neurological: Memory loss, left facial numbness, focal seizure Psychiatric: neg Sleep : neg   ALLERGIES: No Known Allergies  HOME MEDICATIONS: Outpatient Prescriptions Prior to Visit  Medication Sig Dispense Refill  . acetaminophen (TYLENOL) 500 MG tablet Take 1,000 mg by mouth every 6 (six) hours as needed for headache.    . albuterol (PROVENTIL HFA;VENTOLIN HFA) 108 (90 BASE) MCG/ACT inhaler Inhale 2 puffs into the lungs every 6 (six) hours as needed for wheezing or shortness of breath. 1 Inhaler 2  . aspirin EC 81 MG tablet Take 1 tablet (81 mg total) by mouth daily. 60 tablet 0  . Cholecalciferol (PA VITAMIN D-3) 2000 UNITS CAPS Take 2,000 Units by mouth daily.     Marland Kitchen docusate sodium (COLACE) 100 MG capsule Take 100 mg by mouth 2 (two) times daily.    . famotidine (PEPCID) 20 MG tablet Take 20 mg by mouth daily.    Marland Kitchen HYDROcodone-acetaminophen (NORCO) 10-325 MG per tablet Take 1 tablet by mouth every 6 (six) hours as  needed (pain).     . metoprolol tartrate (LOPRESSOR) 25 MG tablet TAKE 1/2 TABLET BY MOUTH TWICE DAILY 30 tablet 3  . nitroGLYCERIN (NITROSTAT) 0.4 MG SL tablet Place 1 tablet (0.4 mg total) under the tongue every 5 (five) minutes as needed for chest pain (CP or SOB). (Patient taking differently: Place 0.4 mg under the tongue every 5 (five) minutes as needed (chest pain or shortness of breath). ) 25 tablet 3  . oxcarbazepine (TRILEPTAL) 600 MG tablet Take 1 tablet (600 mg total) by mouth 3 (three) times daily. 90 tablet 11  . predniSONE (DELTASONE) 20 MG tablet 2 tabs po qd x 5d, then 1 tab po qd x 5d, then 1/2 tab po qd x 6d 18 tablet 0  . simvastatin (ZOCOR) 40 MG tablet Take 40 mg by mouth daily with supper.     Marland Kitchen HYDROcodone-homatropine (HYDROMET) 5-1.5 MG/5ML syrup Take 5 mLs by mouth every 8 (eight) hours as needed for cough. (Patient not taking: Reported on 12/25/2014) 120 mL 0   No facility-administered medications prior to visit.    PAST MEDICAL HISTORY: Past Medical History  Diagnosis Date  . HTN (hypertension)   . CAD (coronary artery disease)     Premature; stents + known distal RCA/PDA occlusion  . MI (myocardial infarction) ([place]) 2009    "just had arm pain; never any chest pain"  . Exertional dyspnea 10/07/2011  . 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 Headache(784.0) 10/07/2011     a couple per month  . Pneumonia ~ 2010    "2 years in a row; May both times"  . 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 Chronic lower back pain     Still with recurrent left sided LBP with paresthesias down left leg --primarily with activities: pain pill use is avg <90 tabs per mo  . Urethral stricture since 1981    Recurrent dilation has been required [facility] urology)  . History of adenomatous polyp of colon 02/2009;02/2014    Recall 5 yrs ([facility])  . COPD (chronic obstructive pulmonary disease) ([place])     NOTED on CT chest 11/2013  done for lung ca screening.  Hosp for acute exac 11/2014.  Marland Kitchen Hyperlipidemia   . Nephrolithiasis 03/2014    Bilateral (87mm) nonobstructing  . Tobacco abuse     PAST SURGICAL HISTORY: Past Surgical History  Procedure Laterality Date  . Craniotomy for avm  1981    Dx'd after pt began having seizures.  . Tonsillectomy      "I was little"  . Lumbar disc surgery  2008; 2009    Dr. Joya Salm  . Coronary angioplasty with stent placement  2009    3 DES to LCx; still with known distal RCA/PDA occlusion.  EF 60% by LV-gram.  . Colonoscopy w/ polypectomy  02/2009;02/2014    Recall 5 yr (aden poly, hyperplast polyp, int and ext hemorr).  Next recall is 02/2019.  . Cardiac catheterization  2011    [facility], records unavailable: pt reports "normal".  . Transthoracic echocardiogram  12/07/14    Normal LV size/fxn, EF 60%, normal valves.    FAMILY HISTORY: Family History  Problem Relation Age of Onset  . Hyperlipidemia Mother   . Cancer Father     Mesothelioma  . Hypertension Father   . Stroke Neg Hx   . Heart attack Paternal Uncle     In 82s too    SOCIAL HISTORY: Social History   Social History  . Marital Status: Married    Spouse Name: [name]  . Number of Children: 2  . Years of Education: 10 th   Occupational History  .      retired   Social History Main Topics  . Smoking status: Former Smoker -- 3.00 packs/day for 27 years    Types: Cigarettes    Quit date: 02/08/2001  . Smokeless tobacco: Never Used  . Alcohol Use: 0.6 oz/week    1 Cans of beer per week     Comment: 3-4 times a week  . Drug Use: No  . Sexual Activity: Yes   Other Topics Concern  . Not on file   Social History Narrative   Patient lives at home with his wife [name]).   Two adult children.   Retired from [employer] approx age 66 due to medical reasons (disabled due to memory issues, seizure d/o and chronic LBP).   Education 10th grade.   Right handed.   Caffeine two cups of tea and two cups of coffee.    Tobacco 45 pack-yr hx, quit approx age 29.  Alcohol: 1-2 beers per night.     No hx of alc or drug problems.   No exercise.   Diet: regular     PHYSICAL EXAM  Filed Vitals:   12/25/14 0952  BP: 132/80  Pulse: 64  Height: 5\' 11"  (1.803 m)  Weight: 241 lb (109.317 kg)   Body mass index is 33.63 kg/(m^2). Generalized: In no acute distress  Neck: Supple, no carotid bruits  Cardiac: Regular rate rhythm  Pulmonary: Clear to auscultation bilaterally  Musculoskeletal: No deformity  Neurological examination  Mentation: Alert oriented to time, place, history taking, and causual conversation,.  Cranial nerve II-XII: Pupils were equal round reactive to light extraocular movements were full, visual field were full on confrontational test. facial sensation and strength were normal. hearing was intact to finger rubbing bilaterally. Uvula tongue midline. head turning and shoulder shrug and were normal and symmetric.Tongue protrusion into cheek strength was normal.  Motor: normal tone, bulk and strength. No focal weakness Sensory: Intact to fine touch, pinprick, preserved vibratory sensation, and proprioception in upper and lower extremities Coordination: Normal finger to nose, heel-to-shin bilaterally  Gait: Rising up from seated position without assistance, normal stance, without trunk ataxia, moderate stride, good arm swing, smooth turning, no assistive device Romberg signs: Negative  Deep tendon reflexes: Symmetric upper and lower, plantar responses were flexor bilaterally.       DIAGNOSTIC DATA (LABS, IMAGING, TESTING) - I reviewed patient records, labs, notes, testing and imaging myself where available.  Lab Results  Component Value Date   WBC 10.3 03/28/2014   HGB 15.6 12/07/2014   HCT 46.0 12/07/2014   MCV 88.8 03/28/2014   PLT 228.0 03/28/2014      Component Value Date/Time   NA 136 12/08/2014 1140   NA 134 08/09/2013 1127   K 4.4 12/08/2014 1140   CL 105  12/08/2014 1140   CO2 20* 12/08/2014 1140   GLUCOSE 159* 12/08/2014 1140   GLUCOSE 112* 08/09/2013 1127   BUN 8 12/08/2014 1140   BUN 10 08/09/2013 1127   CREATININE 0.81 12/08/2014 1140   CREATININE 0.91 12/05/2014 1039   CALCIUM 9.3 12/08/2014 1140   PROT 6.5 12/08/2014 1140   PROT 6.5 08/09/2013 1127   ALBUMIN 3.5 12/08/2014 1140   ALBUMIN 4.1 08/09/2013 1127   AST 23 12/08/2014 1140   ALT 26 12/08/2014 1140   ALKPHOS 81 12/08/2014 1140   BILITOT 0.4 12/08/2014 1140   GFRNONAA >60 12/08/2014 1140   GFRAA >60 12/08/2014 1140     ASSESSMENT AND PLAN  54 y.o. year old male  has a past medical history of HTN (hypertension); CAD (coronary artery disease); MI (myocardial infarction) ([place]) (2009);  Short-term memory loss (10/07/2011); [id number]) (10/07/2011);  Petit-mal epilepsy ([place]) (1981-present); Chronic lower back pain;  COPD (chronic obstructive pulmonary disease) ([place]); Hyperlipidemia; and  ER visit yesterday for seizure. Patient now complaining of left facial numbness.  Trileptal level in the morning before dose Will set up for CT of the head with and without requires prior AUTHORIZATION(patient cannot have MRI due to metal plate right frontal craniotomy site)  Continue Trileptal at current dose for now Follow-up in 3 months next visit with Dr. Krista Blue Vst time 25 min Dennie Bible, [facility], [facility], [facility] [address] [place], [place] [address] [phone]

## 2014-12-25 NOTE — Telephone Encounter (Signed)
.   She had me speak to her husband who described that he had a seizure-like event that was reminiscent of seizure activity he had about 20 years ago. He described that he had been diagnosed with an arteriovenous malformation which was surgically treated. Before surgery he had seizure events and this spell after more than a decade reminded him of a seizure. He also states that for several weeks he has had left-sided numbness involving clumsiness and heaviness of the left body. He could not tell me where his AVM had been located. He believes that his AVM was actually treated in 1982. He had been followed by Dr. love at that time.  He was on the way to the emergency room and I asked him to make a follow-up appointment for today with his attending neurologist. He stated that this would be Dr. Krista Blue but that [facility] His wife wanted him to see the attending physician in this visit.CD

## 2014-12-25 NOTE — Telephone Encounter (Signed)
He was just seen by Hoyle Sauer NP in 12/25/2014 10am, the note by Dr. Brett Fairy was generate the same day on 5pm.  He did presented to [facility] in 11/15.  Please check on patient, if he is doing well now, keep scheduled visit, if needed may move up his follow up appt with me.

## 2014-12-25 NOTE — Patient Instructions (Signed)
Trileptal level in the morning dose Will set up for CT of the head with and without requires prior AUTHORIZATION Continue Trileptal at current dose for now Follow-up in 3 months next visit with Dr. Evelena Leyden

## 2014-12-26 ENCOUNTER — Other Ambulatory Visit (INDEPENDENT_AMBULATORY_CARE_PROVIDER_SITE_OTHER): Payer: Self-pay

## 2014-12-26 DIAGNOSIS — Z0289 Encounter for other administrative examinations: Secondary | ICD-10-CM

## 2014-12-26 DIAGNOSIS — Z5181 Encounter for therapeutic drug level monitoring: Secondary | ICD-10-CM

## 2014-12-26 DIAGNOSIS — R569 Unspecified convulsions: Secondary | ICD-10-CM

## 2014-12-26 NOTE — Telephone Encounter (Signed)
Patient is returning a call. °

## 2014-12-26 NOTE — Telephone Encounter (Signed)
LMTC./fim 

## 2014-12-26 NOTE — Telephone Encounter (Signed)
I have spoken with Webb--he saw [facility] yesterday, sts. was back in the office this am for labwork.  Waiting for CT head to be scheduled.  Sts.  he is feeling better, will keep reg. sched. f/u/fim

## 2014-12-27 ENCOUNTER — Encounter (HOSPITAL_COMMUNITY): Payer: Self-pay | Admitting: *Deleted

## 2014-12-27 ENCOUNTER — Ambulatory Visit (HOSPITAL_BASED_OUTPATIENT_CLINIC_OR_DEPARTMENT_OTHER)
Admission: RE | Admit: 2014-12-27 | Discharge: 2014-12-27 | Disposition: A | Discharge status: Home / Self Care | Payer: Medicare HMO | Source: Ambulatory Visit | Attending: Nurse Practitioner | Admitting: Nurse Practitioner

## 2014-12-27 ENCOUNTER — Inpatient Hospital Stay (HOSPITAL_COMMUNITY)
Admission: EM | Admit: 2014-12-27 | Discharge: 2014-12-28 | DRG: 065 | Disposition: A | Discharge status: Home / Self Care | Payer: Medicare HMO | Attending: Internal Medicine | Admitting: Internal Medicine

## 2014-12-27 ENCOUNTER — Emergency Department (HOSPITAL_COMMUNITY): Payer: Medicare HMO

## 2014-12-27 ENCOUNTER — Other Ambulatory Visit: Payer: Self-pay

## 2014-12-27 ENCOUNTER — Telehealth: Payer: Self-pay | Admitting: Neurology

## 2014-12-27 ENCOUNTER — Encounter (HOSPITAL_BASED_OUTPATIENT_CLINIC_OR_DEPARTMENT_OTHER): Payer: Self-pay

## 2014-12-27 DIAGNOSIS — I10 Essential (primary) hypertension: Secondary | ICD-10-CM | POA: Diagnosis present

## 2014-12-27 DIAGNOSIS — Z87891 Personal history of nicotine dependence: Secondary | ICD-10-CM

## 2014-12-27 DIAGNOSIS — I611 Nontraumatic intracerebral hemorrhage in hemisphere, cortical: Principal | ICD-10-CM | POA: Diagnosis present

## 2014-12-27 DIAGNOSIS — R569 Unspecified convulsions: Secondary | ICD-10-CM

## 2014-12-27 DIAGNOSIS — K5900 Constipation, unspecified: Secondary | ICD-10-CM | POA: Diagnosis present

## 2014-12-27 DIAGNOSIS — I252 Old myocardial infarction: Secondary | ICD-10-CM | POA: Diagnosis not present

## 2014-12-27 DIAGNOSIS — Q282 Arteriovenous malformation of cerebral vessels: Secondary | ICD-10-CM

## 2014-12-27 DIAGNOSIS — Z8249 Family history of ischemic heart disease and other diseases of the circulatory system: Secondary | ICD-10-CM | POA: Diagnosis not present

## 2014-12-27 DIAGNOSIS — D1802 Hemangioma of intracranial structures: Secondary | ICD-10-CM

## 2014-12-27 DIAGNOSIS — I25119 Atherosclerotic heart disease of native coronary artery with unspecified angina pectoris: Secondary | ICD-10-CM | POA: Diagnosis not present

## 2014-12-27 DIAGNOSIS — G40909 Epilepsy, unspecified, not intractable, without status epilepticus: Secondary | ICD-10-CM | POA: Diagnosis not present

## 2014-12-27 DIAGNOSIS — Z9889 Other specified postprocedural states: Secondary | ICD-10-CM

## 2014-12-27 DIAGNOSIS — I872 Venous insufficiency (chronic) (peripheral): Secondary | ICD-10-CM | POA: Diagnosis present

## 2014-12-27 DIAGNOSIS — K219 Gastro-esophageal reflux disease without esophagitis: Secondary | ICD-10-CM | POA: Diagnosis present

## 2014-12-27 DIAGNOSIS — Z955 Presence of coronary angioplasty implant and graft: Secondary | ICD-10-CM | POA: Diagnosis not present

## 2014-12-27 DIAGNOSIS — I629 Nontraumatic intracranial hemorrhage, unspecified: Secondary | ICD-10-CM | POA: Diagnosis not present

## 2014-12-27 DIAGNOSIS — Z7982 Long term (current) use of aspirin: Secondary | ICD-10-CM

## 2014-12-27 DIAGNOSIS — I619 Nontraumatic intracerebral hemorrhage, unspecified: Secondary | ICD-10-CM | POA: Diagnosis not present

## 2014-12-27 DIAGNOSIS — J449 Chronic obstructive pulmonary disease, unspecified: Secondary | ICD-10-CM | POA: Diagnosis present

## 2014-12-27 DIAGNOSIS — E871 Hypo-osmolality and hyponatremia: Secondary | ICD-10-CM | POA: Diagnosis present

## 2014-12-27 DIAGNOSIS — R200 Anesthesia of skin: Secondary | ICD-10-CM

## 2014-12-27 DIAGNOSIS — E785 Hyperlipidemia, unspecified: Secondary | ICD-10-CM | POA: Diagnosis present

## 2014-12-27 DIAGNOSIS — I610 Nontraumatic intracerebral hemorrhage in hemisphere, subcortical: Secondary | ICD-10-CM

## 2014-12-27 DIAGNOSIS — I2510 Atherosclerotic heart disease of native coronary artery without angina pectoris: Secondary | ICD-10-CM | POA: Diagnosis present

## 2014-12-27 DIAGNOSIS — G40509 Epileptic seizures related to external causes, not intractable, without status epilepticus: Secondary | ICD-10-CM

## 2014-12-27 HISTORY — DX: Nontraumatic intracranial hemorrhage, unspecified: I62.9

## 2014-12-27 LAB — COMPREHENSIVE METABOLIC PANEL
ALBUMIN: 3.8 g/dL (ref 3.5–5.0)
ALT: 32 U/L (ref 17–63)
AST: 19 U/L (ref 15–41)
Alkaline Phosphatase: 71 U/L (ref 38–126)
Anion gap: 9 (ref 5–15)
BUN: 10 mg/dL (ref 6–20)
CHLORIDE: 100 mmol/L — AB (ref 101–111)
CO2: 20 mmol/L — ABNORMAL LOW (ref 22–32)
CREATININE: 0.78 mg/dL (ref 0.61–1.24)
Calcium: 9 mg/dL (ref 8.9–10.3)
GFR calc Af Amer: >60 mL/min (ref >60)
GLUCOSE: 118 mg/dL — AB (ref 65–99)
Potassium: 4.1 mmol/L (ref 3.5–5.1)
Sodium: 129 mmol/L — ABNORMAL LOW (ref 135–145)
Total Bilirubin: 0.6 mg/dL (ref 0.3–1.2)
Total Protein: 6.7 g/dL (ref 6.5–8.1)

## 2014-12-27 LAB — CBG MONITORING, ED: Glucose-Capillary: 95 mg/dL (ref 65–99)

## 2014-12-27 LAB — I-STAT CHEM 8, ED
BUN: 11 mg/dL (ref 6–20)
CHLORIDE: 97 mmol/L — AB (ref 101–111)
Calcium, Ion: 1.11 mmol/L — ABNORMAL LOW (ref 1.12–1.23)
Creatinine, Ser: 0.7 mg/dL (ref 0.61–1.24)
GLUCOSE: 120 mg/dL — AB (ref 65–99)
HEMATOCRIT: 48 % (ref 39.0–52.0)
HEMOGLOBIN: 16.3 g/dL (ref 13.0–17.0)
POTASSIUM: 4 mmol/L (ref 3.5–5.1)
SODIUM: 130 mmol/L — AB (ref 135–145)
TCO2: 21 mmol/L (ref 0–100)

## 2014-12-27 LAB — CBC
HEMATOCRIT: 43.8 % (ref 39.0–52.0)
HEMOGLOBIN: 15 g/dL (ref 13.0–17.0)
MCH: 30.1 pg (ref 26.0–34.0)
MCHC: 34.2 g/dL (ref 30.0–36.0)
MCV: 88 fL (ref 78.0–100.0)
Platelets: 145 10*3/uL — ABNORMAL LOW (ref 150–400)
RBC: 4.98 MIL/uL (ref 4.22–5.81)
RDW: 13.1 % (ref 11.5–15.5)
WBC: 9.4 10*3/uL (ref 4.0–10.5)

## 2014-12-27 LAB — DIFFERENTIAL
BASOS ABS: 0 10*3/uL (ref 0.0–0.1)
BASOS PCT: 0 %
Eosinophils Absolute: 0 10*3/uL (ref 0.0–0.7)
Eosinophils Relative: 0 %
LYMPHS ABS: 1.1 10*3/uL (ref 0.7–4.0)
Lymphocytes Relative: 12 %
MONOS PCT: 10 %
Monocytes Absolute: 0.9 10*3/uL (ref 0.1–1.0)
NEUTROS ABS: 7.4 10*3/uL (ref 1.7–7.7)
Neutrophils Relative %: 78 %

## 2014-12-27 LAB — I-STAT TROPONIN, ED: TROPONIN I, POC: 0 ng/mL (ref 0.00–0.08)

## 2014-12-27 LAB — PROTIME-INR
INR: 1.09 (ref 0.00–1.49)
Prothrombin Time: 14.3 seconds (ref 11.6–15.2)

## 2014-12-27 LAB — APTT: APTT: 30 s (ref 24–37)

## 2014-12-27 MED ORDER — HYDROCODONE-ACETAMINOPHEN 5-325 MG PO TABS: 1.0000 | ORAL_TABLET | Freq: Once | ORAL | Status: DC

## 2014-12-27 MED ORDER — HYDROCODONE-ACETAMINOPHEN 10-325 MG PO TABS
1.0000 | ORAL_TABLET | Freq: Four times a day (QID) | ORAL | Status: DC | PRN
  Administered 2014-12-28 (×2): 1 via ORAL
  Filled 2014-12-27 (×2): qty 1

## 2014-12-27 MED ORDER — SODIUM CHLORIDE 0.9 % IJ SOLN
3.0000 mL | Freq: Two times a day (BID) | INTRAMUSCULAR | Status: DC
  Administered 2014-12-27: 3 mL via INTRAVENOUS

## 2014-12-27 MED ORDER — DOCUSATE SODIUM 100 MG PO CAPS
100.0000 mg | ORAL_CAPSULE | Freq: Two times a day (BID) | ORAL | Status: DC
  Administered 2014-12-28: 100 mg via ORAL
  Filled 2014-12-27: qty 1

## 2014-12-27 MED ORDER — HYDROCODONE-ACETAMINOPHEN 5-325 MG PO TABS
2.0000 | ORAL_TABLET | Freq: Once | ORAL | Status: AC
  Administered 2014-12-27: 2 via ORAL
  Filled 2014-12-27: qty 2

## 2014-12-27 MED ORDER — IOHEXOL 350 MG/ML SOLN
50.0000 mL | Freq: Once | INTRAVENOUS | Status: AC | PRN
  Administered 2014-12-27: 50 mL via INTRAVENOUS

## 2014-12-27 MED ORDER — NITROGLYCERIN 0.4 MG SL SUBL: 0.4000 mg | SUBLINGUAL_TABLET | SUBLINGUAL | Status: DC | PRN

## 2014-12-27 MED ORDER — LORAZEPAM 2 MG/ML IJ SOLN: 1.0000 mg | INTRAMUSCULAR | Status: DC | PRN

## 2014-12-27 MED ORDER — IOHEXOL 300 MG/ML  SOLN
75.0000 mL | Freq: Once | INTRAMUSCULAR | Status: AC | PRN
  Administered 2014-12-27: 75 mL via INTRAVENOUS

## 2014-12-27 MED ORDER — SODIUM CHLORIDE 0.9 % IV BOLUS (SEPSIS)
500.0000 mL | Freq: Once | INTRAVENOUS | Status: AC
  Administered 2014-12-27: 500 mL via INTRAVENOUS

## 2014-12-27 MED ORDER — FAMOTIDINE 20 MG PO TABS
20.0000 mg | ORAL_TABLET | Freq: Every day | ORAL | Status: DC
  Administered 2014-12-27 – 2014-12-28 (×2): 20 mg via ORAL
  Filled 2014-12-27 (×2): qty 1

## 2014-12-27 MED ORDER — STROKE: EARLY STAGES OF RECOVERY BOOK
Freq: Once | Status: AC
  Administered 2014-12-27
  Filled 2014-12-27: qty 1

## 2014-12-27 MED ORDER — DIVALPROEX SODIUM 250 MG PO DR TAB
250.0000 mg | DELAYED_RELEASE_TABLET | Freq: Two times a day (BID) | ORAL | Status: DC
  Administered 2014-12-27 – 2014-12-28 (×2): 250 mg via ORAL
  Filled 2014-12-27 (×3): qty 1

## 2014-12-27 MED ORDER — OXCARBAZEPINE 300 MG PO TABS
450.0000 mg | ORAL_TABLET | Freq: Three times a day (TID) | ORAL | Status: DC
  Administered 2014-12-27 – 2014-12-28 (×2): 450 mg via ORAL
  Filled 2014-12-27 (×5): qty 1

## 2014-12-27 MED ORDER — SIMVASTATIN 40 MG PO TABS: 40.0000 mg | ORAL_TABLET | Freq: Every day | ORAL | Status: DC

## 2014-12-27 MED ORDER — ACETAMINOPHEN 325 MG PO TABS: 650.0000 mg | ORAL_TABLET | Freq: Four times a day (QID) | ORAL | Status: DC | PRN

## 2014-12-27 MED ORDER — SODIUM CHLORIDE 0.9 % IV SOLN
INTRAVENOUS | Status: DC
  Administered 2014-12-27: 22:00:00 via INTRAVENOUS

## 2014-12-27 MED ORDER — VITAMIN D3 25 MCG (1000 UNIT) PO TABS
2000.0000 [IU] | ORAL_TABLET | Freq: Every day | ORAL | Status: DC
  Administered 2014-12-28: 2000 [IU] via ORAL
  Filled 2014-12-27 (×3): qty 2

## 2014-12-27 MED ORDER — ALBUTEROL SULFATE HFA 108 (90 BASE) MCG/ACT IN AERS: 2.0000 | INHALATION_SPRAY | Freq: Four times a day (QID) | RESPIRATORY_TRACT | Status: DC | PRN

## 2014-12-27 MED ORDER — METOPROLOL TARTRATE 12.5 MG HALF TABLET
12.5000 mg | ORAL_TABLET | Freq: Two times a day (BID) | ORAL | Status: DC
  Administered 2014-12-27 – 2014-12-28 (×2): 12.5 mg via ORAL
  Filled 2014-12-27 (×2): qty 1

## 2014-12-27 NOTE — Progress Notes (Signed)
Patient ID: Peter Barnett, male   DOB: May 29, 1960, 54 y.o.   MRN: [id number] Patients and ct head see. Note dictated. Will f/u  Spoke with him and wife.

## 2014-12-27 NOTE — Consult Note (Signed)
NEURO HOSPITALIST CONSULT NOTE   Requestig physician: Dr. 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 He had seizures postoperatively and has been on Trileptal 600 mg 3 times a day for several years. He had a breakthrough seizure 3 days ago and was seen in the emergency room. He followed with his regular neurologist in office and had a CT of the brain done. It showed small intracerebral hemorrhage in the right frontal lobe adjacent to a previous right AVM postsurgical clips. The etiology for this hemorrhage is not known at this time.  Patient does not have any new neurological symptoms attributable to this hemorrhage. He has chronic numbness on the lateral aspect of his left lower leg below knee but otherwise no focal neurological symptoms denies any new vision speech problems or any focal motor weakness or gait or balance problems. He has been having headaches with increased frequency lately.  Past Medical History  Diagnosis Date  . HTN (hypertension)   . CAD (coronary artery disease)     Premature; stents + known distal RCA/PDA occlusion  . MI (myocardial infarction) ([place]) 2009    "just had arm pain; never any chest pain"  . Exertional dyspnea 10/07/2011  . 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 Headache(784.0) 10/07/2011     a couple per month  . Pneumonia ~ 2010    "2 years in a row; May both times"  . 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 Chronic lower back pain     Still with recurrent left sided LBP with paresthesias down left leg --primarily with activities: pain pill use is avg <90 tabs per mo  . Urethral stricture since 1981    Recurrent  dilation has been required [facility])  . History of adenomatous polyp of colon 02/2009;02/2014    Recall 5 yrs ([facility])  . COPD (chronic obstructive pulmonary disease) ([place])     NOTED on CT chest 11/2013 done for lung ca screening.  Hosp for acute exac 11/2014.  Marland Kitchen Hyperlipidemia   . Nephrolithiasis 03/2014    Bilateral (3mm) nonobstructing  . Tobacco abuse   . Seizures [facility])     Past Surgical History  Procedure Laterality Date  . Craniotomy for avm  1981    Dx'd after pt began having seizures.  . Tonsillectomy      "I was little"  . Lumbar  disc surgery  2008; 2009    Dr. Joya Salm  . Coronary angioplasty with stent placement  2009    3 DES to LCx; still with known distal RCA/PDA occlusion.  EF 60% by LV-gram.  . Colonoscopy w/ polypectomy  02/2009;02/2014    Recall 5 yr (aden poly, hyperplast polyp, int and ext hemorr).  Next recall is 02/2019.  . Cardiac catheterization  2011    [facility], records unavailable: pt reports "normal".  . Transthoracic echocardiogram  12/07/14    Normal LV size/fxn, EF 60%, normal valves.    Family History  Problem Relation Age of Onset  . Hyperlipidemia Mother   . Cancer Father     Mesothelioma  . Hypertension Father   . Stroke Neg Hx   . Heart attack Paternal Uncle     In 83s too    Family History: CAD, HTN   Social History:  reports that he quit smoking about 13 years ago. His smoking use included Cigarettes. He has a 81 pack-year smoking history. He has never used smokeless tobacco. He reports that he drinks about 0.6 oz of alcohol per week. He reports that he does not use illicit drugs.  No Known Allergies  MEDICATIONS:                                                                                                                     Prior to Admission:  (Not in a hospital admission) Scheduled: . divalproex  250 mg Oral Q12H  . HYDROcodone-acetaminophen  1 tablet Oral Once  . HYDROcodone-acetaminophen  1 tablet  Oral Once   Continuous: . sodium chloride     Current Facility-Administered Medications  Medication Dose Route Frequency Provider Last Rate Last Dose  . divalproex (DEPAKOTE) DR tablet 250 mg  250 mg Oral Q12H Quintella Reichert, MD      . HYDROcodone-acetaminophen (NORCO/VICODIN) 5-325 MG per tablet 1 tablet  1 tablet Oral Once Quintella Reichert, MD      . HYDROcodone-acetaminophen (NORCO/VICODIN) 5-325 MG per tablet 1 tablet  1 tablet Oral Once Quintella Reichert, MD      . sodium chloride 0.9 % bolus 500 mL  500 mL Intravenous Once Quintella Reichert, MD       Current Outpatient Prescriptions  Medication Sig Dispense Refill  . acetaminophen (TYLENOL) 500 MG tablet Take 1,000 mg by mouth every 6 (six) hours as needed for headache.    . albuterol (PROVENTIL HFA;VENTOLIN HFA) 108 (90 BASE) MCG/ACT inhaler Inhale 2 puffs into the lungs every 6 (six) hours as needed for wheezing or shortness of breath. 1 Inhaler 2  . aspirin 325 MG EC tablet Take 325 mg by mouth daily.    . Cholecalciferol (PA VITAMIN D-3) 2000 UNITS CAPS Take 2,000 Units by mouth daily.     Marland Kitchen docusate sodium (COLACE) 100 MG capsule Take 100 mg by mouth 2 (two) times daily.    . famotidine (PEPCID) 20 MG tablet Take 20 mg by mouth daily.    Marland Kitchen  HYDROcodone-acetaminophen (NORCO) 10-325 MG per tablet Take 1 tablet by mouth every 6 (six) hours as needed (pain).     . metoprolol tartrate (LOPRESSOR) 25 MG tablet TAKE 1/2 TABLET BY MOUTH TWICE DAILY 30 tablet 3  . oxcarbazepine (TRILEPTAL) 600 MG tablet Take 1 tablet (600 mg total) by mouth 3 (three) times daily. 90 tablet 11  . simvastatin (ZOCOR) 40 MG tablet Take 40 mg by mouth daily with supper.     Marland Kitchen aspirin EC 81 MG tablet Take 1 tablet (81 mg total) by mouth daily. 60 tablet 0  . nitroGLYCERIN (NITROSTAT) 0.4 MG SL tablet Place 1 tablet (0.4 mg total) under the tongue every 5 (five) minutes as needed for chest pain (CP or SOB). (Patient taking differently: Place 0.4 mg under the tongue every  5 (five) minutes as needed (chest pain or shortness of breath). ) 25 tablet 3      ROS:                                                                                                                                       History obtained from the patient  General ROS: negative for - chills, fatigue, fever, night sweats, weight gain or weight loss Psychological ROS: negative for - behavioral disorder, hallucinations, memory difficulties, mood swings or suicidal ideation Ophthalmic ROS: negative for - blurry vision, double vision, eye pain or loss of vision ENT ROS: negative for - epistaxis, nasal discharge, oral lesions, sore throat, tinnitus or vertigo Allergy and Immunology ROS: negative for - hives or itchy/watery eyes Hematological and Lymphatic ROS: negative for - bleeding problems, bruising or swollen lymph nodes Endocrine ROS: negative for - galactorrhea, hair pattern changes, polydipsia/polyuria or temperature intolerance Respiratory ROS: negative for - cough, hemoptysis, shortness of breath or wheezing Cardiovascular ROS: negative for - chest pain, dyspnea on exertion, edema or irregular heartbeat Gastrointestinal ROS: negative for - abdominal pain, diarrhea, hematemesis, nausea/vomiting or stool incontinence Genito-Urinary ROS: negative for - dysuria, hematuria, incontinence or urinary frequency/urgency Musculoskeletal ROS: negative for - joint swelling or muscular weakness Neurological ROS: as noted in HPI Dermatological ROS: negative for rash and skin lesion changes   Blood pressure 137/79, pulse 75, temperature 98.3 F (36.8 C), temperature source Oral, resp. rate 18, height 5\' 10"  (1.778 m), weight 108.863 kg (240 lb), SpO2 96 %.   Neurologic Examination:  HEENT-  Normocephalic, no lesions, without obvious abnormality.  Normal external eye and conjunctiva.  Normal  external  ears. Normal external nose.   Neurological Examination Mental Status: Alert, oriented, thought content appropriate.  Speech fluent without evidence of aphasia.  Able to follow 3 step commands without difficulty. Cranial Nerves: II:  Visual fields grossly normal, pupils equal, round, reactive to light and accommodation III,IV, VI: ptosis not present, extra-ocular motions intact bilaterally V,VII: smile symmetric, facial light touch sensation normal bilaterally VIII: hearing normal bilaterally IX,X: uvula rises symmetrically XI: bilateral shoulder shrug XII: midline tongue extension Motor: Right : Upper extremity   5/5    Left:     Upper extremity   5/5  Lower extremity   5/5     Lower extremity   5/5 Tone and bulk:normal tone throughout; no atrophy noted Sensory: Pinprick and light touch intact throughout, bilaterally, except for mild reduced sensation to pinprick on the lateral aspect of his left leg below-knee in the peroneal sensory distribution.  Deep Tendon Reflexes: 2+ and symmetric throughout Plantars: Right: downgoing   Left: downgoing Cerebellar: normal finger-to-nose.  Gait: normal gait and station   Lab Results: Basic Metabolic Panel:  Recent Labs Lab 12/27/14 1444 12/27/14 1506  NA 129* 130*  K 4.1 4.0  CL 100* 97*  CO2 20*  --   GLUCOSE 118* 120*  BUN 10 11  CREATININE 0.78 0.70  CALCIUM 9.0  --     Liver Function Tests:  Recent Labs Lab 12/27/14 1444  AST 19  ALT 32  ALKPHOS 71  BILITOT 0.6  PROT 6.7  ALBUMIN 3.8   No results for input(s): LIPASE, AMYLASE in the last 168 hours. No results for input(s): AMMONIA in the last 168 hours.  CBC:  Recent Labs Lab 12/27/14 1444 12/27/14 1506  WBC 9.4  --   NEUTROABS 7.4  --   HGB 15.0 16.3  HCT 43.8 48.0  MCV 88.0  --   PLT 145*  --     Cardiac Enzymes: No results for input(s): CKTOTAL, CKMB, CKMBINDEX, TROPONINI in the last 168 hours.  Lipid Panel: No results for input(s): CHOL, TRIG,  HDL, CHOLHDL, VLDL, LDLCALC in the last 168 hours.  CBG:  Recent Labs Lab 12/27/14 1529  GLUCAP 95    Microbiology: Results for orders placed or performed in visit on 05/12/13  Urine culture     Status: None   Collection Time: 05/12/13 11:37 AM  Result Value Ref Range Status   Culture ESCHERICHIA COLI  Final   Colony Count >=100,000 COLONIES/ML  Final   Organism ID, Bacteria ESCHERICHIA COLI  Final      Susceptibility   Escherichia coli -  (no method available)    AMPICILLIN 4 Sensitive     AMOX/CLAVULANIC <=2 Sensitive     AMPICILLIN/SULBACTAM <=2 Sensitive     PIP/TAZO <=4 Sensitive     IMIPENEM <=0.25 Sensitive     CEFAZOLIN <=4 Sensitive     CEFTRIAXONE <=1 Sensitive     CEFTAZIDIME <=1 Sensitive     CEFEPIME <=1 Sensitive     GENTAMICIN <=1 Sensitive     TOBRAMYCIN <=1 Sensitive     CIPROFLOXACIN <=0.25 Sensitive     LEVOFLOXACIN <=0.12 Sensitive     NITROFURANTOIN 32 Sensitive     TRIMETH/SULFA <=20 Sensitive     Coagulation Studies:  Recent Labs  12/27/14 1444  LABPROT 14.3  INR 1.09    Imaging: Ct Head W Wo Contrast  12/27/2014  CLINICAL DATA:  Generalized seizure 12/24/2014. Left facial numbness. Remote brain surgery for AVM. Headache. EXAM: CT HEAD WITHOUT AND WITH CONTRAST TECHNIQUE: Contiguous axial images were obtained from the base of the skull through the vertex without and with intravenous contrast CONTRAST:  51mL OMNIPAQUE IOHEXOL 300 MG/ML  SOLN COMPARISON:  10/17/2007 FINDINGS: Sequelae of right frontal craniotomy are again identified presumably for treatment of an AVM given history. Multiple aneurysm clips are again seen as well as additional punctate radiopaque densities throughout the right cerebral hemisphere, also likely treatment related. There is an oblong focus of hyperattenuation in an area of gliotic medial right frontal lobe anterior and superior to the right lateral ventricle which is new from the prior study and consistent with acute  hemorrhage. This measures up to 11 mm in transverse thickness and extends over a craniocaudal length of at least 2 cm, with evaluation limited by streak artifact from adjacent aneurysm clips. Surrounding right frontal lobe parenchymal hypoattenuation is at most minimally more prominent than on the prior study and likely largely reflects chronic gliosis although there may be a small amount of superimposed acute vasogenic edema related to the hemorrhage. There is no evidence of acute intracranial hemorrhage elsewhere. 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 The there is also a tiny, chronic left cerebellar infarct. There is no midline shift or extra-axial fluid collection. No definite abnormal enhancement is identified. Orbits are unremarkable. Mild bilateral ethmoid air cell mucosal thickening is noted. The mastoid air cells are clear. IMPRESSION: 1. Small acute hemorrhage in an area of gliotic medial right frontal lobe. No significant mass effect or midline shift. 2. Chronic postoperative and ischemic changes as above. Critical Value/emergent results were called by telephone at the time of interpretation on 12/27/2014 at 11:54 am to Dr. Jannifer Franklin, who verbally acknowledged these results. Electronically Signed   By: Logan Bores M.D.   On: 12/27/2014 11:56     Assessment/Plan:   54 year old male patient with a past neurological history significant for a right frontal AVM surgical repair with a craniectomy and clip in 1981, history of epilepsy secondary to this and has been on chronic Trileptal 600 mg 3 times a day. He had a breakthrough seizure 3 days ago. Reported missing his nighttime dose of Trileptal couple of days prior to that although he said that it was not unusual for him to miss a few doses once in a while. He did not have a breakthrough seizure for several years prior to this. The CT brain incidentally showed a small intracerebral  hemorrhage adjacent to the postsurgical clip in the right frontal lobe. No definite evidence of any head trauma with seizure.  next  For further neurodiagnostic evaluation I recommend obtaining a CT angiogram of the head to evaluate for any residual or recurrent AVM or an aneurysm. Also recommend obtaining a neurosurgical consultation after completing a CT angiogram.  With regard to his seizure medication management, he has mild hyponatremia with sodium 129, could be contributed by Trileptal. I recommend reducing the dose of Trileptal to 450 mg 3 times a day and recommend adding   Depakote DR 250 mg tablets twice a day. Depakote may also be helpful to manage his headaches which have been increasing in frequency over the past several days. Discussed common side effects associated with Depakote .   He will be admitted to the hospitalist service for overnight observation and until completion of the  neurosurgical consultation. Neurology service will continue to follow during this hospitalization. please call for any further questions .

## 2014-12-27 NOTE — Progress Notes (Signed)
Patient arrived to [facility]M03 from the ED accompanied by family. Safety precautions and orders reviewed with patient/family. Patient denied any pain at this time. No other discomfort noticed. TELE applied an confirmed. Attending MD paged. Will continue to monitor,   Ave Filter, RN

## 2014-12-27 NOTE — ED Provider Notes (Signed)
CSN: [id number]     Arrival date & time 12/27/14  1403 History   First MD Initiated Contact with Patient 12/27/14 1504     Chief Complaint  Patient presents with  . Abnormal CT      The history is provided by the patient. No language interpreter was used.   Peter Barnett is a 54 y.o. male with a hx/o HTN, CAD, seizure d/o who presents to the Emergency Department complaining of abnormal head CT. He had a seizure 3 days ago at home. He was evaluated in the emergency department and discharged home. His neurologist ordered an outpatient CT scan of the head. He was referred to the emergency department when the CT scan shows evidence of a frontal lobe bleed. He states that 3 days ago he had shaking of his left upper extremity and his head. The episode lasted about 4 minutes and he had difficulty with speech and some blurred vision at that time. He had no postictal symptoms. This was different from his typical seizures that are described as staring spells. He takes 325 mg of aspirin daily for stents in his heart. He reported feeling well other than a headache that is across the front of his head. He states the headache has been there for many years. He also reports ongoing intermittent tingling of his left upper extremity and left lower extremity.  Past Medical History  Diagnosis Date  . HTN (hypertension)   . CAD (coronary artery disease)     Premature; stents + known distal RCA/PDA occlusion  . MI (myocardial infarction) ([place]) 2009    "just had arm pain; never any chest pain"  . Exertional dyspnea 10/07/2011  . 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 Headache(784.0) 10/07/2011     a couple per month  . Pneumonia ~ 2010    "2 years in a row; May both times"  . 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 Chronic lower back pain     Still with recurrent left sided LBP with paresthesias down left leg --primarily with activities: pain pill use is avg <90 tabs per mo   . Urethral stricture since 1981    Recurrent dilation has been required [facility])  . History of adenomatous polyp of colon 02/2009;02/2014    Recall 5 yrs ([facility])  . COPD (chronic obstructive pulmonary disease) ([place])     NOTED on CT chest 11/2013 done for lung ca screening.  Hosp for acute exac 11/2014.  Marland Kitchen Hyperlipidemia   . Nephrolithiasis 03/2014    Bilateral (44mm) nonobstructing  . Tobacco abuse   . Seizures [facility])    Past Surgical History  Procedure Laterality Date  . Craniotomy for avm  1981    Dx'd after pt began having seizures.  . Tonsillectomy      "I was little"  . Lumbar disc surgery  2008; 2009    Dr. Joya Salm  . Coronary angioplasty with stent placement  2009    3 DES to LCx; still with known distal RCA/PDA occlusion.  EF 60% by LV-gram.  . Colonoscopy w/ polypectomy  02/2009;02/2014    Recall 5 yr (aden poly, hyperplast polyp, int and ext hemorr).  Next recall is 02/2019.  . Cardiac catheterization  2011    [facility], records unavailable: pt reports "normal".  . Transthoracic echocardiogram  12/07/14    Normal LV size/fxn, EF 60%, normal valves.   Family History  Problem Relation Age  of Onset  . Hyperlipidemia Mother   . Cancer Father     Mesothelioma  . Hypertension Father   . Stroke Neg Hx   . Heart attack Paternal Uncle     In 72s too   Social History  Substance Use Topics  . Smoking status: Former Smoker -- 3.00 packs/day for 27 years    Types: Cigarettes    Quit date: 02/08/2001  . Smokeless tobacco: Never Used  . Alcohol Use: 0.6 oz/week    1 Cans of beer per week     Comment: 3-4 times a week    Review of Systems  All other systems reviewed and are negative.     Allergies  Review of patient's allergies indicates no known allergies.  Home Medications   Prior to Admission medications   Medication Sig Start Date End Date Taking? Authorizing Provider  acetaminophen (TYLENOL) 500 MG tablet Take 1,000 mg by mouth  every 6 (six) hours as needed for headache.    Historical Provider, MD  albuterol (PROVENTIL HFA;VENTOLIN HFA) 108 (90 BASE) MCG/ACT inhaler Inhale 2 puffs into the lungs every 6 (six) hours as needed for wheezing or shortness of breath. 12/08/14   Reyne Dumas, MD  aspirin EC 81 MG tablet Take 1 tablet (81 mg total) by mouth daily. 12/08/14   Reyne Dumas, MD  Cholecalciferol (PA VITAMIN D-3) 2000 UNITS CAPS Take 2,000 Units by mouth daily.     Historical Provider, MD  docusate sodium (COLACE) 100 MG capsule Take 100 mg by mouth 2 (two) times daily.    Historical Provider, MD  famotidine (PEPCID) 20 MG tablet Take 20 mg by mouth daily.    Historical Provider, MD  HYDROcodone-acetaminophen (NORCO) 10-325 MG per tablet Take 1 tablet by mouth every 6 (six) hours as needed (pain).  08/07/14   Historical Provider, MD  metoprolol tartrate (LOPRESSOR) 25 MG tablet TAKE 1/2 TABLET BY MOUTH TWICE DAILY 09/04/14   Tammi Sou, MD  nitroGLYCERIN (NITROSTAT) 0.4 MG SL tablet Place 1 tablet (0.4 mg total) under the tongue every 5 (five) minutes as needed for chest pain (CP or SOB). Patient taking differently: Place 0.4 mg under the tongue every 5 (five) minutes as needed (chest pain or shortness of breath).  12/05/14 02/01/18  Jettie Booze, MD  oxcarbazepine (TRILEPTAL) 600 MG tablet Take 1 tablet (600 mg total) by mouth 3 (three) times daily. 08/15/14   Dennie Bible, NP  predniSONE (DELTASONE) 20 MG tablet 2 tabs po qd x 5d, then 1 tab po qd x 5d, then 1/2 tab po qd x 6d 12/11/14   Tammi Sou, MD  simvastatin (ZOCOR) 40 MG tablet Take 40 mg by mouth daily with supper.     Historical Provider, MD   BP 142/87 mmHg  Pulse 66  Temp(Src) 98.3 F (36.8 C) (Oral)  Resp 18  Ht 5\' 10"  (1.778 m)  Wt 240 lb (108.863 kg)  BMI 34.44 kg/m2  SpO2 95% Physical Exam  Constitutional: He is oriented to person, place, and time. He appears well-developed and well-nourished.  HENT:  Head: Normocephalic  and atraumatic.  Eyes: EOM are normal. Pupils are equal, round, and reactive to light.  Cardiovascular: Normal rate and regular rhythm.   No murmur heard. Pulmonary/Chest: Effort normal and breath sounds normal. No respiratory distress.  Abdominal: Soft. There is no tenderness. There is no rebound and no guarding.  Musculoskeletal: He exhibits no edema or tenderness.  Neurological: He is alert and oriented  to person, place, and time. No cranial nerve deficit. Coordination normal.  5 out of 5 strength in all 4 extremities. Subjective decreased sensation to light touch over the left distal lower extremity.  Skin: Skin is warm and dry.  Psychiatric: He has a normal mood and affect. His behavior is normal.  Flat affect  Nursing note and vitals reviewed.   ED Course  Procedures (including critical care time) Labs Review Labs Reviewed  CBC - Abnormal; Notable for the following:    Platelets 145 (*)    All other components within normal limits  COMPREHENSIVE METABOLIC PANEL - Abnormal; Notable for the following:    Sodium 129 (*)    Chloride 100 (*)    CO2 20 (*)    Glucose, Bld 118 (*)    All other components within normal limits  I-STAT CHEM 8, ED - Abnormal; Notable for the following:    Sodium 130 (*)    Chloride 97 (*)    Glucose, Bld 120 (*)    Calcium, Ion 1.11 (*)    All other components within normal limits  PROTIME-INR  APTT  DIFFERENTIAL  BASIC METABOLIC PANEL  CBC  I-STAT TROPOININ, ED  CBG MONITORING, ED    Imaging Review Ct Head W Wo Contrast  12/27/2014  CLINICAL DATA:  Generalized seizure 12/24/2014. Left facial numbness. Remote brain surgery for AVM. Headache. EXAM: CT HEAD WITHOUT AND WITH CONTRAST TECHNIQUE: Contiguous axial images were obtained from the base of the skull through the vertex without and with intravenous contrast CONTRAST:  16mL OMNIPAQUE IOHEXOL 300 MG/ML  SOLN COMPARISON:  10/17/2007 FINDINGS: Sequelae of right frontal craniotomy are again  identified presumably for treatment of an AVM given history. Multiple aneurysm clips are again seen as well as additional punctate radiopaque densities throughout the right cerebral hemisphere, also likely treatment related. There is an oblong focus of hyperattenuation in an area of gliotic medial right frontal lobe anterior and superior to the right lateral ventricle which is new from the prior study and consistent with acute hemorrhage. This measures up to 11 mm in transverse thickness and extends over a craniocaudal length of at least 2 cm, with evaluation limited by streak artifact from adjacent aneurysm clips. Surrounding right frontal lobe parenchymal hypoattenuation is at most minimally more prominent than on the prior study and likely largely reflects chronic gliosis although there may be a small amount of superimposed acute vasogenic edema related to the hemorrhage. There is no evidence of acute intracranial hemorrhage elsewhere. 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 The there is also a tiny, chronic left cerebellar infarct. There is no midline shift or extra-axial fluid collection. No definite abnormal enhancement is identified. Orbits are unremarkable. Mild bilateral ethmoid air cell mucosal thickening is noted. The mastoid air cells are clear. IMPRESSION: 1. Small acute hemorrhage in an area of gliotic medial right frontal lobe. No significant mass effect or midline shift. 2. Chronic postoperative and ischemic changes as above. Critical Value/emergent results were called by telephone at the time of interpretation on 12/27/2014 at 11:54 am to Dr. Jannifer Franklin, who verbally acknowledged these results. Electronically Signed   By: Logan Bores M.D.   On: 12/27/2014 11:56   I have personally reviewed and evaluated these images and lab results as part of my medical decision-making.   EKG Interpretation None      MDM   Final  diagnoses:  Seizure ([place])  Patient referred for finding an intracranial hemorrhage on head CT was ordered as an outpatient. Discussed with neuro hospitalist who saw the patient in consult. CTA obtained without any evidence of AVM. Discussed with Dr. Joya Salm who will see the patient in consult. Discussed with the hospitalist regarding admission.    Quintella Reichert, MD 12/28/14 [phone]

## 2014-12-27 NOTE — ED Notes (Addendum)
Pt sent here by Dr Jannifer Franklin at [facility] for CT that showed bleed to the frontal lobe.  Pt was seen here for [facility] seizure on Tues and discharged. Hx of "staring" seizures.  Pt denies any change in symptoms from Tuesday.  No code stroke per Dr Billy Fischer.

## 2014-12-27 NOTE — Telephone Encounter (Signed)
I called the patient. He had onset of the left arm and head jerking 12/25/14:  CT of the head shows a small right frontal ICH. The patient has a history of a right frontal AVM resection, there is some gliosis in the area of the hemorrhage, but this is likely the source of the focal seizures. The patient appears to be doing fairly well otherwise, but I'll have the patient come to the emergency room for observation, he may require angiographic evaluation of the AVM again. I have contacted the emergency room the let them know that he is coming.

## 2014-12-27 NOTE — H&P (Signed)
[facility] History and Physical  Peter Barnett [id number] DOB: [birth year] DOA: 12/27/2014  Referring physician: ED physician PCP: Tammi Sou, MD  Specialists:   Chief Complaint: Seizures, HA and intracranial bleeding  HPI: Peter Barnett is a 54 y.o. male with PMH of right frontal AVM, s/p right frontal craniotomy, hypertension, hyperlipidemia, COPD, gerd, CAD, s/p of stent 2009, seizure disorder, chronic back pain, kidney stone, who presents with seizure, HA and intracranial bleeding.  Patient has history of seizure, currently is taking Trileptal. He states that he had another seizure on Tuesday, and was seen in the emergency room. He declined CT head in that visit. He was discharged home and followed-up with neurology, Dr Jannifer Franklin, who did CT-head for pt today, showing small acute hemorrhage in an area of gliotic medial right frontal lobe without significant mass effect or midline shift. Patient reports that he has chronic left leg numbness, which has not changed. He has new mild numbness in the left face today. He does not have new weakness in his extremities. Patient reports that he has been having a headache recently, which has almost resolved in the emergency room. No vision change or hearing loss. Patient does not have chest pain, shortness of breath, abdominal pain, diarrhea, symptoms of UTI, fever or chills. Neurology, Dr. Donneta Romberg was consulted by ED, who recommended CTA of head, which was done in ED, showing remote treatment for right frontal AVM, but no evidence of recurrent AVM or aneurysm.   In ED, patient was found to have INR 1.09, PTT 30, troponin negative, temperature normal, electrolytes and renal function okay. Patient is admitted to inpatient for further evaluation and treatment. Neurosurgeon was consulted by ED.  Where does patient live?   At home   Can patient participate in ADLs?  Yes    Review of Systems:   General: no fevers, chills, no changes in body  weight, has fatigue HEENT: no blurry vision, hearing changes or sore throat Pulm: no dyspnea, coughing, wheezing CV: no chest pain, palpitations Abd: no nausea, vomiting, abdominal pain, diarrhea, constipation GU: no dysuria, burning on urination, increased urinary frequency, hematuria  Ext: no leg edema Neuro: has numbness in left leg and left face, no vision change or hearing loss Skin: no rash MSK: No muscle spasm, no deformity, no limitation of range of movement in spin Heme: No easy bruising.  Travel history: No recent long distant travel.  Allergy: No Known Allergies  Past Medical History  Diagnosis Date  . HTN (hypertension)   . CAD (coronary artery disease)     Premature; stents + known distal RCA/PDA occlusion  . MI (myocardial infarction) ([place]) 2009    "just had arm pain; never any chest pain"  . Exertional dyspnea 10/07/2011  . 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 Headache(784.0) 10/07/2011     a couple per month  . Pneumonia ~ 2010    "2 years in a row; May both times"  . 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 Chronic lower back pain     Still with recurrent left sided LBP with paresthesias down left leg --primarily with activities: pain pill use is avg <90 tabs per mo  . Urethral stricture since 1981    Recurrent dilation has been required [facility] urology)  . History of adenomatous polyp of colon 02/2009;02/2014    Recall 5 yrs ([facility])  . COPD (chronic obstructive pulmonary disease) ([place])  NOTED on CT chest 11/2013 done for lung ca screening.  Hosp for acute exac 11/2014.  Marland Kitchen Hyperlipidemia   . Nephrolithiasis 03/2014    Bilateral (33mm) nonobstructing  . Tobacco abuse   . Seizures [facility])     Past Surgical History  Procedure Laterality Date  . Craniotomy for avm  1981    Dx'd after pt began having seizures.  . Tonsillectomy      "I was little"  . Lumbar disc surgery  2008; 2009    Dr. Joya Salm   . Coronary angioplasty with stent placement  2009    3 DES to LCx; still with known distal RCA/PDA occlusion.  EF 60% by LV-gram.  . Colonoscopy w/ polypectomy  02/2009;02/2014    Recall 5 yr (aden poly, hyperplast polyp, int and ext hemorr).  Next recall is 02/2019.  . Cardiac catheterization  2011    [facility], records unavailable: pt reports "normal".  . Transthoracic echocardiogram  12/07/14    Normal LV size/fxn, EF 60%, normal valves.    Social History:  reports that he quit smoking about 13 years ago. His smoking use included Cigarettes. He has a 81 pack-year smoking history. He has never used smokeless tobacco. He reports that he drinks about 0.6 oz of alcohol per week. He reports that he does not use illicit drugs.  Family History:  Family History  Problem Relation Age of Onset  . Hyperlipidemia Mother   . Cancer Father     Mesothelioma  . Hypertension Father   . Stroke Neg Hx   . Heart attack Paternal Uncle     In 61s too     Prior to Admission medications   Medication Sig Start Date End Date Taking? Authorizing Provider  acetaminophen (TYLENOL) 500 MG tablet Take 1,000 mg by mouth every 6 (six) hours as needed for headache.   Yes Historical Provider, MD  albuterol (PROVENTIL HFA;VENTOLIN HFA) 108 (90 BASE) MCG/ACT inhaler Inhale 2 puffs into the lungs every 6 (six) hours as needed for wheezing or shortness of breath. 12/08/14  Yes Reyne Dumas, MD  aspirin 325 MG EC tablet Take 325 mg by mouth daily.   Yes Historical Provider, MD  Cholecalciferol (PA VITAMIN D-3) 2000 UNITS CAPS Take 2,000 Units by mouth daily.    Yes Historical Provider, MD  docusate sodium (COLACE) 100 MG capsule Take 100 mg by mouth 2 (two) times daily.   Yes Historical Provider, MD  famotidine (PEPCID) 20 MG tablet Take 20 mg by mouth daily.   Yes Historical Provider, MD  HYDROcodone-acetaminophen (NORCO) 10-325 MG per tablet Take 1 tablet by mouth every 6 (six) hours as needed (pain).  08/07/14  Yes  Historical Provider, MD  metoprolol tartrate (LOPRESSOR) 25 MG tablet TAKE 1/2 TABLET BY MOUTH TWICE DAILY 09/04/14  Yes Tammi Sou, MD  oxcarbazepine (TRILEPTAL) 600 MG tablet Take 1 tablet (600 mg total) by mouth 3 (three) times daily. 08/15/14  Yes Dennie Bible, NP  simvastatin (ZOCOR) 40 MG tablet Take 40 mg by mouth daily with supper.    Yes Historical Provider, MD  aspirin EC 81 MG tablet Take 1 tablet (81 mg total) by mouth daily. 12/08/14   Reyne Dumas, MD  nitroGLYCERIN (NITROSTAT) 0.4 MG SL tablet Place 1 tablet (0.4 mg total) under the tongue every 5 (five) minutes as needed for chest pain (CP or SOB). Patient taking differently: Place 0.4 mg under the tongue every 5 (five) minutes as needed (chest pain or shortness of breath).  12/05/14 02/01/18  Jettie Booze, MD    Physical Exam: Filed Vitals:   12/27/14 2100 12/27/14 2115 12/27/14 2123 12/27/14 2130  BP: 114/60 134/82 134/82 131/80  Pulse: 63 72 71 67  Temp:      TempSrc:      Resp: 16 19  26   Height:      Weight:      SpO2: 96% 97%  94%   General: Not in acute distress HEENT:       Eyes: PERRL, EOMI, no scleral icterus.       ENT: No discharge from the ears and nose, no pharynx injection, no tonsillar enlargement.        Neck: No JVD, no bruit, no mass felt. Heme: No neck lymph node enlargement. Cardiac: S1/S2, RRR, No murmurs, No gallops or rubs. Pulm: Good air movement bilaterally. No rales, wheezing, rhonchi or rubs. Abd: Soft, nondistended, nontender, no rebound pain, no organomegaly, BS present. Ext: No pitting leg edema bilaterally. 2+DP/PT pulse bilaterally. Musculoskeletal: No joint deformities, No joint redness or warmth, no limitation of ROM in spin. Skin: No rashes.  Neuro: Alert, oriented X3, cranial nerves II-XII grossly intact, muscle strength 5/5 in all extremities, sensation to light touch is slightly decreased in the left leg in the face. Brachial reflex 1+ bilaterally. Knee reflex  1+ bilaterally. Negative Babinski's sign. Normal finger to nose test. Psych: Patient is not psychotic, no suicidal or hemocidal ideation.  Labs on Admission:  Basic Metabolic Panel:  Recent Labs Lab 12/27/14 1444 12/27/14 1506  NA 129* 130*  K 4.1 4.0  CL 100* 97*  CO2 20*  --   GLUCOSE 118* 120*  BUN 10 11  CREATININE 0.78 0.70  CALCIUM 9.0  --    Liver Function Tests:  Recent Labs Lab 12/27/14 1444  AST 19  ALT 32  ALKPHOS 71  BILITOT 0.6  PROT 6.7  ALBUMIN 3.8   No results for input(s): LIPASE, AMYLASE in the last 168 hours. No results for input(s): AMMONIA in the last 168 hours. CBC:  Recent Labs Lab 12/27/14 1444 12/27/14 1506  WBC 9.4  --   NEUTROABS 7.4  --   HGB 15.0 16.3  HCT 43.8 48.0  MCV 88.0  --   PLT 145*  --    Cardiac Enzymes: No results for input(s): CKTOTAL, CKMB, CKMBINDEX, TROPONINI in the last 168 hours.  BNP (last 3 results) No results for input(s): BNP in the last 8760 hours.  ProBNP (last 3 results) No results for input(s): PROBNP in the last 8760 hours.  CBG:  Recent Labs Lab 12/27/14 1529  GLUCAP 95    Radiological Exams on Admission: Ct Angio Head W/cm &/or Wo Cm  12/27/2014  CLINICAL DATA:  Seizure.  History of AVM treatment 1981 EXAM: CT ANGIOGRAPHY HEAD TECHNIQUE: Multidetector CT imaging of the head was performed using the standard protocol during bolus administration of intravenous contrast. Multiplanar CT image reconstructions and MIPs were obtained to evaluate the vascular anatomy. CONTRAST:  28mL OMNIPAQUE IOHEXOL 350 MG/ML SOLN COMPARISON:  CT head earlier today FINDINGS: CT HEAD Brain: Right frontal craniotomy. Treatment of right frontal AVM with embolic particles. Two clips are present along the right anterior falx. There is a surgical clip in the mid right frontal lobe and a surgical clip under the craniotomy flap anteriorly. There is encephalomalacia in the right frontal lobe. There is considerable streak  artifact due to embolic material and clips. There is hyperdensity anterior to the clip measuring  approximately 9 x 21 mm which is similar to the earlier study. There is also slight hyperdensity posterior to the encephalomalacia. These areas of hyperdensity are not apparent on a prior CT of 10/17/2007 and therefore suggestive of recent hemorrhage. These are unchanged from earlier today. Mild atrophy. Negative for hydrocephalus. Negative for acute infarct or mass. Calvarium and skull base: Right frontal craniotomy changes. No acute skeletal abnormality. Paranasal sinuses: Mucosal edema in the ethmoid sinuses bilaterally. Remaining sinuses clear. Orbits: Negative CTA HEAD Anterior circulation: Cavernous carotid is patent bilaterally with mild atherosclerotic calcification. Anterior cerebral arteries are patent bilaterally. Middle cerebral arteries are patent bilaterally without significant stenosis. Treatment of right frontal AVM with surgical clips and embolic material and prior surgery. No evidence of recurrent AVM. No enlarged feeding vessels and no enlarged draining vein. The areas of mild increased density around the surgical clips the left frontal lobe do not show abnormal enhancement and not appear to represent recurrent AVM. Posterior circulation: Left vertebral artery dominant. Right vertebral artery ends in PICA. PICA patent bilaterally. Basilar patent. Posterior cerebral artery and superior cerebellar artery patent bilaterally. Posterior communicating artery patent bilaterally. Venous sinuses: Patent Delayed phase:No enhancing mass lesion following contrast infusion. The areas of recent hemorrhage do not show abnormal enhancement on delayed imaging. IMPRESSION: Remote treatment for right frontal AVM. There has been right frontal craniotomy. There are multiple surgical clips as well as in the vascular embolic particles. There is considerable artifact in the area from the clips. There is encephalomalacia in  the right frontal lobe Anterior and posterior to the encephalomalacia are areas of increased density which were not present in 2009 and therefore indicative of recent mild hemorrhage. No evidence of recurrent AVM or aneurysm on the CTA. Catheter angiography is suggested for further evaluation. Electronically Signed   By: Franchot Gallo M.D.   On: 12/27/2014 20:07   Ct Head W Wo Contrast  12/27/2014  CLINICAL DATA:  Generalized seizure 12/24/2014. Left facial numbness. Remote brain surgery for AVM. Headache. EXAM: CT HEAD WITHOUT AND WITH CONTRAST TECHNIQUE: Contiguous axial images were obtained from the base of the skull through the vertex without and with intravenous contrast CONTRAST:  82mL OMNIPAQUE IOHEXOL 300 MG/ML  SOLN COMPARISON:  10/17/2007 FINDINGS: Sequelae of right frontal craniotomy are again identified presumably for treatment of an AVM given history. Multiple aneurysm clips are again seen as well as additional punctate radiopaque densities throughout the right cerebral hemisphere, also likely treatment related. There is an oblong focus of hyperattenuation in an area of gliotic medial right frontal lobe anterior and superior to the right lateral ventricle which is new from the prior study and consistent with acute hemorrhage. This measures up to 11 mm in transverse thickness and extends over a craniocaudal length of at least 2 cm, with evaluation limited by streak artifact from adjacent aneurysm clips. Surrounding right frontal lobe parenchymal hypoattenuation is at most minimally more prominent than on the prior study and likely largely reflects chronic gliosis although there may be a small amount of superimposed acute vasogenic edema related to the hemorrhage. There is no evidence of acute intracranial hemorrhage elsewhere. 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 The there is also a tiny, chronic left cerebellar infarct.  There is no midline shift or extra-axial fluid collection. No definite abnormal enhancement is identified. Orbits are unremarkable. Mild bilateral ethmoid air cell mucosal thickening is noted. The mastoid air cells are  clear. IMPRESSION: 1. Small acute hemorrhage in an area of gliotic medial right frontal lobe. No significant mass effect or midline shift. 2. Chronic postoperative and ischemic changes as above. Critical Value/emergent results were called by telephone at the time of interpretation on 12/27/2014 at 11:54 am to Dr. Jannifer Franklin, who verbally acknowledged these results. Electronically Signed   By: Logan Bores M.D.   On: 12/27/2014 11:56    EKG: Independently reviewed. QTC 425, LAD  Assessment/Plan Principal Problem:   Intracranial bleeding (HCC) Active Problems:   Seizures (HCC)   H/O craniotomy   HTN (hypertension), benign   Hyperlipidemia   Venous insufficiency of both lower extremities   Coronary atherosclerosis of native coronary artery   Arteriovenous malformation of cerebral vessels   H/O Spinal surgery   COPD (chronic obstructive pulmonary disease) (HCC)  Intracranial hemorrhage:  CT-head showed small acute hemorrhage in an area of gliotic medial right frontal lobe without significant mass effect or midline shift. CTA of head showed remote treatment for right frontal AVM, but no evidence of recurrent AVM or aneurysm. The only new symptom is mild left facial numbness. Neurology was consulted, Dr. Silverio Decamp saw patient. Neurosurgery was also consulted, will see patient in the morning.  -Will admit to tele bed -frequent neuro checks -Hold ASA -Avoid heparin PPx -NPO and SLP -NS 75 cc/h -Control Blood pressure -Colace for constipation -Appreciate neurology's recommendations -follow-up neurosurgeon's recommendations  Seizure: patient recurrent seizure is likely triggered by intracranial bleeding. Neurology was consulted, Dr. Silverio Decamp saw patient. -we follow-up Dr. Woodward Ku  recommendations as follows:  He has mild hyponatremia with sodium 129, could be contributed by Trileptal. I recommend reducing the dose of Trileptal to 450 mg 3 times a day  Adding Depakote DR 250 mg tablets twice a day. Depakote may also be helpful to manage his headaches which have been increasing in frequency over the past several days.   -seizure precaution -prn ativan for seizure  HTN: -On Metoprolol  HLD: Last LDL was 95 on 10/31/13 -Continue home medications: Zocor  CAD: s/p of stent in 2009. Used to be on aspirin 325 mg daily, which was decreased to 81 mg qd since last week. -Hold aspirin due to intracranial bleeding -Continue metoprolol and Zocor -When necessary nitroglycerin  COPD: stable. No signs of acute exacerbation -when necessary albuterol inhaler  GERD: -Pepcid    DVT ppx: SCD Code Status: Full code Family Communication:  Yes, patient's wife at bed side Disposition Plan: Admit to inpatient   Date of Service 12/27/2014    Ivor Costa [facility] Pager [phone]  If 7PM-7AM, please contact night-coverage [email or web address] Password [facility] 12/27/2014, 9:36 PM

## 2014-12-27 NOTE — ED Notes (Signed)
CBG 95 

## 2014-12-28 DIAGNOSIS — Z9889 Other specified postprocedural states: Secondary | ICD-10-CM

## 2014-12-28 DIAGNOSIS — I25119 Atherosclerotic heart disease of native coronary artery with unspecified angina pectoris: Secondary | ICD-10-CM

## 2014-12-28 DIAGNOSIS — I10 Essential (primary) hypertension: Secondary | ICD-10-CM

## 2014-12-28 DIAGNOSIS — E785 Hyperlipidemia, unspecified: Secondary | ICD-10-CM

## 2014-12-28 DIAGNOSIS — I629 Nontraumatic intracranial hemorrhage, unspecified: Secondary | ICD-10-CM

## 2014-12-28 LAB — BASIC METABOLIC PANEL
ANION GAP: 7 (ref 5–15)
BUN: 8 mg/dL (ref 6–20)
CALCIUM: 8.6 mg/dL — AB (ref 8.9–10.3)
CO2: 23 mmol/L (ref 22–32)
CREATININE: 0.81 mg/dL (ref 0.61–1.24)
Chloride: 102 mmol/L (ref 101–111)
Glucose, Bld: 111 mg/dL — ABNORMAL HIGH (ref 65–99)
Potassium: 3.5 mmol/L (ref 3.5–5.1)
SODIUM: 132 mmol/L — AB (ref 135–145)

## 2014-12-28 LAB — CBC
HCT: 40.7 % (ref 39.0–52.0)
HEMOGLOBIN: 13.8 g/dL (ref 13.0–17.0)
MCH: 29.9 pg (ref 26.0–34.0)
MCHC: 33.9 g/dL (ref 30.0–36.0)
MCV: 88.1 fL (ref 78.0–100.0)
PLATELETS: 144 10*3/uL — AB (ref 150–400)
RBC: 4.62 MIL/uL (ref 4.22–5.81)
RDW: 13.2 % (ref 11.5–15.5)
WBC: 8.3 10*3/uL (ref 4.0–10.5)

## 2014-12-28 LAB — GLUCOSE, CAPILLARY: GLUCOSE-CAPILLARY: 111 mg/dL — AB (ref 65–99)

## 2014-12-28 MED ORDER — DIVALPROEX SODIUM 250 MG PO DR TAB: 250.0000 mg | DELAYED_RELEASE_TABLET | Freq: Two times a day (BID) | ORAL | Status: DC

## 2014-12-28 MED ORDER — OXCARBAZEPINE 150 MG PO TABS
450.0000 mg | ORAL_TABLET | Freq: Three times a day (TID) | ORAL | Status: DC
Start: 2014-12-28 — End: 2015-06-17

## 2014-12-28 NOTE — Discharge Summary (Signed)
Physician Discharge Summary  Peter Barnett [id number] DOB: Jan 18, 1961 DOA: 12/27/2014  PCP: Tammi Sou, MD  Admit date: 12/27/2014 Discharge date: 12/28/2014  Time spent: 35 minutes  Recommendations for Outpatient Follow-up:  1. Outpatient angiogram- message left with secretary for scheduling   Discharge Diagnoses:  Principal Problem:   Intracranial bleeding ([place]) Active Problems:   Seizures ([place])   H/O craniotomy   HTN (hypertension), benign   Hyperlipidemia   Venous insufficiency of both lower extremities   Coronary atherosclerosis of native coronary artery   Arteriovenous malformation of cerebral vessels   H/O Spinal surgery   COPD (chronic obstructive pulmonary disease) (HCC)   GERD (gastroesophageal reflux disease)   Discharge Condition: improved  Diet recommendation: cardiac  Filed Weights   12/27/14 1430  Weight: 108.863 kg (240 lb)    History of present illness:  Peter Barnett is a 54 y.o. male with PMH of right frontal AVM, s/p right frontal craniotomy, hypertension, hyperlipidemia, COPD, gerd, CAD, s/p of stent 2009, seizure disorder, chronic back pain, kidney stone, who presents with seizure, HA and intracranial bleeding.  Patient has history of seizure, currently is taking Trileptal. He states that he had another seizure on Tuesday, and was seen in the emergency room. He declined CT head in that visit. He was discharged home and followed-up with neurology, Dr Jannifer Franklin, who did CT-head for pt today, showing small acute hemorrhage in an area of gliotic medial right frontal lobe without significant mass effect or midline shift. Patient reports that he has chronic left leg numbness, which has not changed. He has new mild numbness in the left face today. He does not have new weakness in his extremities. Patient reports that he has been having a headache recently, which has almost resolved in the emergency room. No vision change or hearing loss. Patient does  not have chest pain, shortness of breath, abdominal pain, diarrhea, symptoms of UTI, fever or chills. Neurology, Dr. Donneta Romberg was consulted by ED, who recommended CTA of head, which was done in ED, showing remote treatment for right frontal AVM, but no evidence of recurrent AVM or aneurysm.   In ED, patient was found to have INR 1.09, PTT 30, troponin negative, temperature normal, electrolytes and renal function okay. Patient is admitted to inpatient for further evaluation and treatment. Neurosurgeon was consulted by ED.  Hospital Course:  Seizure: patient recurrent seizure is likely triggered by intracranial bleeding. Neurology was consulted, Dr. Silverio Decamp saw patient. recommendations as follows:  He has mild hyponatremia with sodium 129, could be contributed by Trileptal. reducing the dose of Trileptal to 450 mg 3 times a day  Adding Depakote DR 250 mg tablets twice a day. Depakote may also be helpful to manage his headaches which have been increasing in frequency over the past several days.  -seizure precaution -outpatient cerebral angiogram- message left with IR scheduling Ok with neuro to d/c with outpatient follow up  HTN: -On Metoprolol  HLD: Last LDL was 95 on 10/31/13 -Continue home medications: Zocor  CAD: s/p of stent in 2009. Used to be on aspirin 325 mg daily, which was decreased to 81 mg qd since last week. -Hold aspirin due to intracranial bleeding -Continue metoprolol and Zocor -When necessary nitroglycerin Follow up with PCP regarding re-starting ASA  COPD: stable. No signs of acute exacerbation -when necessary albuterol inhaler  GERD: -Pepcid   Procedures:    Consultations:  NS  neuro  Discharge Exam: Filed Vitals:   12/28/14 0600 12/28/14 1022  BP:  114/71 127/98  Pulse: 59 59  Temp: 98.1 F (36.7 C) 97.8 F (36.6 C)  Resp: 20       Discharge Instructions   Discharge Instructions    Diet - low sodium heart healthy    Complete by:  As  directed      Discharge instructions    Complete by:  As directed   Outpatient cerebral angiogram     Increase activity slowly    Complete by:  As directed           Discharge Medication List as of 12/28/2014 11:39 AM    START taking these medications   Details  divalproex (DEPAKOTE) 250 MG DR tablet Take 1 tablet (250 mg total) by mouth every 12 (twelve) hours., Starting 12/28/2014, Until Discontinued, Print      CONTINUE these medications which have CHANGED   Details  OXcarbazepine (TRILEPTAL) 150 MG tablet Take 3 tablets (450 mg total) by mouth 3 (three) times daily., Starting 12/28/2014, Until Discontinued, Print      CONTINUE these medications which have NOT CHANGED   Details  acetaminophen (TYLENOL) 500 MG tablet Take 1,000 mg by mouth every 6 (six) hours as needed for headache., Until Discontinued, Historical Med    albuterol (PROVENTIL HFA;VENTOLIN HFA) 108 (90 BASE) MCG/ACT inhaler Inhale 2 puffs into the lungs every 6 (six) hours as needed for wheezing or shortness of breath., Starting 12/08/2014, Until Discontinued, Print    Cholecalciferol (PA VITAMIN D-3) 2000 UNITS CAPS Take 2,000 Units by mouth daily. , Until Discontinued, Historical Med    docusate sodium (COLACE) 100 MG capsule Take 100 mg by mouth 2 (two) times daily., Until Discontinued, Historical Med    famotidine (PEPCID) 20 MG tablet Take 20 mg by mouth daily., Until Discontinued, Historical Med    HYDROcodone-acetaminophen (NORCO) 10-325 MG per tablet Take 1 tablet by mouth every 6 (six) hours as needed (pain). , Starting 08/07/2014, Until Discontinued, Historical Med    metoprolol tartrate (LOPRESSOR) 25 MG tablet TAKE 1/2 TABLET BY MOUTH TWICE DAILY, Normal    simvastatin (ZOCOR) 40 MG tablet Take 40 mg by mouth daily with supper. , Until Discontinued, Historical Med    nitroGLYCERIN (NITROSTAT) 0.4 MG SL tablet Place 1 tablet (0.4 mg total) under the tongue every 5 (five) minutes as needed for chest  pain (CP or SOB)., Starting 12/05/2014, Until Wed 02/01/18, Normal      STOP taking these medications     aspirin 325 MG EC tablet      aspirin EC 81 MG tablet        No Known Allergies Follow-up Information    Follow up with MCGOWEN,PHILIP H, MD In 1 week.   Specialty:  Family Medicine   Contact information:   [address] [place] [address] [phone]       Follow up with Rob Hickman, MD.   Specialty:  Interventional Radiology   Why:  should hear from [facility] (scheduler) by Monday PM--- if not her number is [phone]   Contact information:   [address] [place] [address] [phone]       Follow up with Marcial Pacas, MD.   Specialty:  Neurology   Why:  for depakote management and labs   Contact information:   [place] [address] [phone]        The results of significant diagnostics from this hospitalization (including imaging, microbiology, ancillary and laboratory) are listed  below for reference.    Significant Diagnostic Studies: Ct Angio Head W/cm &/or Wo Cm  12/27/2014  CLINICAL DATA:  Seizure.  History of AVM treatment 1981 EXAM: CT ANGIOGRAPHY HEAD TECHNIQUE: Multidetector CT imaging of the head was performed using the standard protocol during bolus administration of intravenous contrast. Multiplanar CT image reconstructions and MIPs were obtained to evaluate the vascular anatomy. CONTRAST:  65mL OMNIPAQUE IOHEXOL 350 MG/ML SOLN COMPARISON:  CT head earlier today FINDINGS: CT HEAD Brain: Right frontal craniotomy. Treatment of right frontal AVM with embolic particles. Two clips are present along the right anterior falx. There is a surgical clip in the mid right frontal lobe and a surgical clip under the craniotomy flap anteriorly. There is encephalomalacia in the right frontal lobe. There is considerable streak artifact due to embolic material and clips. There is hyperdensity anterior to the clip  measuring approximately 9 x 21 mm which is similar to the earlier study. There is also slight hyperdensity posterior to the encephalomalacia. These areas of hyperdensity are not apparent on a prior CT of 10/17/2007 and therefore suggestive of recent hemorrhage. These are unchanged from earlier today. Mild atrophy. Negative for hydrocephalus. Negative for acute infarct or mass. Calvarium and skull base: Right frontal craniotomy changes. No acute skeletal abnormality. Paranasal sinuses: Mucosal edema in the ethmoid sinuses bilaterally. Remaining sinuses clear. Orbits: Negative CTA HEAD Anterior circulation: Cavernous carotid is patent bilaterally with mild atherosclerotic calcification. Anterior cerebral arteries are patent bilaterally. Middle cerebral arteries are patent bilaterally without significant stenosis. Treatment of right frontal AVM with surgical clips and embolic material and prior surgery. No evidence of recurrent AVM. No enlarged feeding vessels and no enlarged draining vein. The areas of mild increased density around the surgical clips the left frontal lobe do not show abnormal enhancement and not appear to represent recurrent AVM. Posterior circulation: Left vertebral artery dominant. Right vertebral artery ends in PICA. PICA patent bilaterally. Basilar patent. Posterior cerebral artery and superior cerebellar artery patent bilaterally. Posterior communicating artery patent bilaterally. Venous sinuses: Patent Delayed phase:No enhancing mass lesion following contrast infusion. The areas of recent hemorrhage do not show abnormal enhancement on delayed imaging. IMPRESSION: Remote treatment for right frontal AVM. There has been right frontal craniotomy. There are multiple surgical clips as well as in the vascular embolic particles. There is considerable artifact in the area from the clips. There is encephalomalacia in the right frontal lobe Anterior and posterior to the encephalomalacia are areas of  increased density which were not present in 2009 and therefore indicative of recent mild hemorrhage. No evidence of recurrent AVM or aneurysm on the CTA. Catheter angiography is suggested for further evaluation. Electronically Signed   By: Franchot Gallo M.D.   On: 12/27/2014 20:07   Dg Chest 2 View  12/07/2014  CLINICAL DATA:  Tingling in arm/legs, cough EXAM: CHEST  2 VIEW COMPARISON:  11/28/2014 FINDINGS: Mild bibasilar opacities, likely atelectasis. No focal consolidation. No pleural effusion or pneumothorax. The heart is normal in size. Mild degenerative changes of the visualized thoracolumbar spine. IMPRESSION: Mild bibasilar opacities, likely atelectasis. No evidence of acute cardiopulmonary disease. Electronically Signed   By: Julian Hy M.D.   On: 12/07/2014 18:23   Ct Head W Wo Contrast  12/27/2014  CLINICAL DATA:  Generalized seizure 12/24/2014. Left facial numbness. Remote brain surgery for AVM. Headache. EXAM: CT HEAD WITHOUT AND WITH CONTRAST TECHNIQUE: Contiguous axial images were obtained from the base of the skull through the vertex without and with intravenous  contrast CONTRAST:  48mL OMNIPAQUE IOHEXOL 300 MG/ML  SOLN COMPARISON:  10/17/2007 FINDINGS: Sequelae of right frontal craniotomy are again identified presumably for treatment of an AVM given history. Multiple aneurysm clips are again seen as well as additional punctate radiopaque densities throughout the right cerebral hemisphere, also likely treatment related. There is an oblong focus of hyperattenuation in an area of gliotic medial right frontal lobe anterior and superior to the right lateral ventricle which is new from the prior study and consistent with acute hemorrhage. This measures up to 11 mm in transverse thickness and extends over a craniocaudal length of at least 2 cm, with evaluation limited by streak artifact from adjacent aneurysm clips. Surrounding right frontal lobe parenchymal hypoattenuation is at most  minimally more prominent than on the prior study and likely largely reflects chronic gliosis although there may be a small amount of superimposed acute vasogenic edema related to the hemorrhage. There is no evidence of acute intracranial hemorrhage elsewhere. 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 The there is also a tiny, chronic left cerebellar infarct. There is no midline shift or extra-axial fluid collection. No definite abnormal enhancement is identified. Orbits are unremarkable. Mild bilateral ethmoid air cell mucosal thickening is noted. The mastoid air cells are clear. IMPRESSION: 1. Small acute hemorrhage in an area of gliotic medial right frontal lobe. No significant mass effect or midline shift. 2. Chronic postoperative and ischemic changes as above. Critical Value/emergent results were called by telephone at the time of interpretation on 12/27/2014 at 11:54 am to Dr. Jannifer Franklin, who verbally acknowledged these results. Electronically Signed   By: Logan Bores M.D.   On: 12/27/2014 11:56   Ct Chest Lung Ca Screen Low Dose W/o Cm  12/20/2014  CLINICAL DATA:  54 year old male, former smoker who quit 10 years ago, with 60 pack-year history, for follow-up lung cancer screening, EXAM: CT CHEST WITHOUT CONTRAST TECHNIQUE: Multidetector CT imaging of the chest was performed following the standard protocol without IV contrast. COMPARISON:  Lung cancer screening CT chest dated 11/12/2013 FINDINGS: Mediastinum/Nodes: The heart is normal in size. No pericardial effusion. Coronary atherosclerosis with coronary stents in the LAD and left circumflex. Mild atherosclerotic calcifications aortic arch. Small mediastinal lymph nodes which do not meet pathologic CT size criteria. Visualized thyroid is grossly unremarkable. Lungs/Pleura: 3.8 mm (volumetric mean) nodule in the central right middle lobe (series 3/ image 166), unchanged. Additional  scattered subpleural nodularity along the right major fissure measuring up to 4.5 mm (series 3/image 161), unchanged. No new/ suspicious pulmonary nodules. Mild centrilobular and paraseptal emphysematous changes. Scattered linear atelectasis with subpleural reticulation in the lingula and bilateral lower lobes. No focal consolidation. No pleural effusion or pneumothorax. Upper abdomen: Visualized upper abdomen is unremarkable. Musculoskeletal: Degenerative changes of the visualized thoracolumbar spine. IMPRESSION: Scattered pulmonary nodules measuring up to 4.5 mm, unchanged. Lung-RADS Category 2, benign appearance or behavior. Continue annual screening with low-dose chest CT without contrast in 12 months. Electronically Signed   By: Julian Hy M.D.   On: 12/20/2014 11:02    Microbiology: No results found for this or any previous visit (from the past 240 hour(s)).   Labs: Basic Metabolic Panel:  Recent Labs Lab 12/27/14 1444 12/27/14 1506 12/28/14 0213  NA 129* 130* 132*  K 4.1 4.0 3.5  CL 100* 97* 102  CO2 20*  --  23  GLUCOSE 118* 120* 111*  BUN 10 11 8  CREATININE 0.78 0.70 0.81  CALCIUM 9.0  --  8.6*   Liver Function Tests:  Recent Labs Lab 12/27/14 1444  AST 19  ALT 32  ALKPHOS 71  BILITOT 0.6  PROT 6.7  ALBUMIN 3.8   No results for input(s): LIPASE, AMYLASE in the last 168 hours. No results for input(s): AMMONIA in the last 168 hours. CBC:  Recent Labs Lab 12/27/14 1444 12/27/14 1506 12/28/14 0213  WBC 9.4  --  8.3  NEUTROABS 7.4  --   --   HGB 15.0 16.3 13.8  HCT 43.8 48.0 40.7  MCV 88.0  --  88.1  PLT 145*  --  144*   Cardiac Enzymes: No results for input(s): CKTOTAL, CKMB, CKMBINDEX, TROPONINI in the last 168 hours. BNP: BNP (last 3 results) No results for input(s): BNP in the last 8760 hours.  ProBNP (last 3 results) No results for input(s): PROBNP in the last 8760 hours.  CBG:  Recent Labs Lab 12/27/14 1529 12/28/14 0648  GLUCAP 95  111*       Signed:  Digby Groeneveld  [facility] 12/30/2014, 12:12 PM
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
## 2014-12-28 NOTE — Progress Notes (Signed)
Patient ready for discharge to home; discharge instructions given and reviewed; Rx's given; follow up appointments reviewed;patient accepted information; discharged out accompanied by family.

## 2014-12-28 NOTE — Progress Notes (Signed)
Orders received for swallow evaluation however per RN, patient has passed the RN stroke swallow screen and is tolerating diet. Defer formal evaluation per protocol. Please re-consult as needed.  Thank you  Duru Reiger MA, CCC-SLP [phone]   

## 2014-12-29 LAB — 10-HYDROXYCARBAZEPINE: Oxcarbazepine SerPl-Mcnc: 13 ug/mL (ref 10–35)

## 2014-12-30 ENCOUNTER — Other Ambulatory Visit: Payer: Self-pay | Admitting: Radiology

## 2014-12-30 ENCOUNTER — Other Ambulatory Visit (HOSPITAL_COMMUNITY): Payer: Self-pay | Admitting: Interventional Radiology

## 2014-12-30 DIAGNOSIS — Q282 Arteriovenous malformation of cerebral vessels: Secondary | ICD-10-CM

## 2014-12-30 DIAGNOSIS — R200 Anesthesia of skin: Secondary | ICD-10-CM

## 2014-12-30 DIAGNOSIS — I629 Nontraumatic intracranial hemorrhage, unspecified: Secondary | ICD-10-CM

## 2014-12-30 DIAGNOSIS — R569 Unspecified convulsions: Secondary | ICD-10-CM

## 2014-12-31 ENCOUNTER — Ambulatory Visit (INDEPENDENT_AMBULATORY_CARE_PROVIDER_SITE_OTHER): Payer: Medicare HMO | Admitting: Neurology

## 2014-12-31 ENCOUNTER — Ambulatory Visit (HOSPITAL_COMMUNITY)
Admission: RE | Admit: 2014-12-31 | Discharge: 2014-12-31 | Disposition: A | Discharge status: Home / Self Care | Payer: Medicare HMO | Source: Ambulatory Visit | Attending: Interventional Radiology | Admitting: Interventional Radiology

## 2014-12-31 ENCOUNTER — Encounter: Payer: Self-pay | Admitting: Neurology

## 2014-12-31 ENCOUNTER — Other Ambulatory Visit (HOSPITAL_COMMUNITY): Payer: Self-pay | Admitting: Interventional Radiology

## 2014-12-31 ENCOUNTER — Telehealth: Payer: Self-pay | Admitting: Neurology

## 2014-12-31 DIAGNOSIS — R569 Unspecified convulsions: Secondary | ICD-10-CM

## 2014-12-31 DIAGNOSIS — Q282 Arteriovenous malformation of cerebral vessels: Secondary | ICD-10-CM | POA: Diagnosis not present

## 2014-12-31 DIAGNOSIS — R200 Anesthesia of skin: Secondary | ICD-10-CM

## 2014-12-31 DIAGNOSIS — Z09 Encounter for follow-up examination after completed treatment for conditions other than malignant neoplasm: Secondary | ICD-10-CM | POA: Insufficient documentation

## 2014-12-31 DIAGNOSIS — Z87728 Personal history of other specified (corrected) congenital malformations of nervous system and sense organs: Secondary | ICD-10-CM | POA: Insufficient documentation

## 2014-12-31 DIAGNOSIS — Z9889 Other specified postprocedural states: Secondary | ICD-10-CM | POA: Diagnosis not present

## 2014-12-31 DIAGNOSIS — I629 Nontraumatic intracranial hemorrhage, unspecified: Secondary | ICD-10-CM

## 2014-12-31 DIAGNOSIS — R51 Headache: Secondary | ICD-10-CM | POA: Insufficient documentation

## 2014-12-31 HISTORY — PX: OTHER SURGICAL HISTORY: SHX169

## 2014-12-31 MED ORDER — HEPARIN SODIUM (PORCINE) 1000 UNIT/ML IJ SOLN
INTRAMUSCULAR | Status: AC | PRN
  Administered 2014-12-31: 1000 [IU] via INTRAVENOUS

## 2014-12-31 MED ORDER — FENTANYL CITRATE (PF) 100 MCG/2ML IJ SOLN
INTRAMUSCULAR | Status: AC
  Filled 2014-12-31: qty 2

## 2014-12-31 MED ORDER — FENTANYL CITRATE (PF) 100 MCG/2ML IJ SOLN
INTRAMUSCULAR | Status: AC | PRN
  Administered 2014-12-31 (×2): 25 ug via INTRAVENOUS

## 2014-12-31 MED ORDER — MIDAZOLAM HCL 2 MG/2ML IJ SOLN
INTRAMUSCULAR | Status: AC
  Filled 2014-12-31: qty 2

## 2014-12-31 MED ORDER — SODIUM CHLORIDE 0.9 % IV SOLN: INTRAVENOUS | Status: AC

## 2014-12-31 MED ORDER — HYDROCODONE-ACETAMINOPHEN 5-325 MG PO TABS
1.0000 | ORAL_TABLET | Freq: Four times a day (QID) | ORAL | Status: DC | PRN
  Administered 2014-12-31: 2 via ORAL

## 2014-12-31 MED ORDER — HYDROCODONE-ACETAMINOPHEN 5-325 MG PO TABS
ORAL_TABLET | ORAL | Status: AC
  Administered 2014-12-31: 2 via ORAL
  Filled 2014-12-31: qty 2

## 2014-12-31 MED ORDER — IOHEXOL 300 MG/ML  SOLN
200.0000 mL | Freq: Once | INTRAMUSCULAR | Status: DC | PRN
  Administered 2014-12-31: 80 mL via INTRAVENOUS
  Filled 2014-12-31: qty 200

## 2014-12-31 MED ORDER — HEPARIN SODIUM (PORCINE) 1000 UNIT/ML IJ SOLN
INTRAMUSCULAR | Status: AC
Start: 2014-12-31 — End: 2014-12-31
  Filled 2014-12-31: qty 1

## 2014-12-31 MED ORDER — DIVALPROEX SODIUM ER 500 MG PO TB24: 500.0000 mg | ORAL_TABLET | Freq: Every day | ORAL | Status: DC

## 2014-12-31 MED ORDER — MIDAZOLAM HCL 2 MG/2ML IJ SOLN
INTRAMUSCULAR | Status: AC | PRN
  Administered 2014-12-31: 1 mg via INTRAVENOUS

## 2014-12-31 NOTE — Telephone Encounter (Signed)
Test results still pending.

## 2014-12-31 NOTE — Discharge Instructions (Signed)

## 2014-12-31 NOTE — Procedures (Signed)
S/P 4 vessel cerebral arteriogram RT CFA approach. Findings. 1NO angio evidence of residual or recurrent AVM  Or of aneurysms . Venous drainage WNLs

## 2014-12-31 NOTE — Sedation Documentation (Signed)
IR holding pressure after exoseal placement

## 2014-12-31 NOTE — Telephone Encounter (Signed)
Call patient angiogram report once it is available

## 2014-12-31 NOTE — Progress Notes (Addendum)
PATIENT: Peter Barnett DOB: 1960/10/18  Chief Complaint  Patient presents with  . Seizures     HISTORICAL  GARVICE GURNEE is a 54 years old male, accompanied by his wife,   He had PMH of right frontal AVM, s/p right frontal craniotomy, hypertension, hyperlipidemia, COPD, gerd, CAD, s/p of stent 2009, seizure disorder, chronic back pain, kidney stone, who presents with seizure, HA and intracranial bleeding.  He suffered complex partial seizure, in 1981, was diagnosed with right frontal AV malformation, had a right frontal craniotomy by Dr. Lesly Dukes at [facility] in [facility], his seizure has been under good control, but he continued to have occasional staring spells, which was considered due to simple partial or complex partial seizure, over the years, he has tried different medications, Dilantin caused gingiva hypertrophy, Keppra cause mood disorder, Topamax complains of memory trouble,   He was also seen by Dr. Joya Salm for his lumbar stenosis, he had 2 lumbar surgery, most recent one was in 2010, this was evaluated by lumbar myelogram. He is receiving some epidural injection for his chronic low back pain which has been beneficial  He had 10 years of education, quit high school because he never was good at school, and did not like school, works at [employer] job all his life, went on disability since 1981 after his right frontal craniotomy, he stays at home, doing household chores, patient stated he never had good memory all his life, wife noticed worsening memory over the past few years. EEG was normal at 2014  He also has frequent headaches,  He presented with recurrent seizure in December 07 2014, left arm numbness, intermittent left facial numbness, December 24 2014, left arm uncontrollable shaking, mild confusion, difficulty talking,  CAT scan of the brain in December 27 2014 reviewed with patient: Small acute hemorrhage in an area of gliotic medial right frontal lobe.  Chronic postoperative and ischemic changes. I also personally reviewed CT angiogram: Remote treatment for right frontal AVM. There has been right frontal craniotomy. There are multiple surgical clips as well as in the vascular embolic particles.no evidence of recurrent AVM or aneurysm on the CTA. He will have four-vessel angiogram today  He was discharged home with Trileptal 150 mg 3 tablets 3 times a day, Depakote DR 250 mg twice a day, he complains of fatigue, dizziness with current dose of medications   I also reviewed laboratory in December 28 2014, low sodium 133, normal CBC with exception of low platelets 144  REVIEW OF SYSTEMS: Full 14 system review of systems performed and notable only for shortness of breath, memory loss, headaches, numbness, seizure, confusion  ALLERGIES: No Known Allergies  HOME MEDICATIONS: Current Outpatient Prescriptions  Medication Sig Dispense Refill  . acetaminophen (TYLENOL) 500 MG tablet Take 1,000 mg by mouth every 6 (six) hours as needed for headache.    . albuterol (PROVENTIL HFA;VENTOLIN HFA) 108 (90 BASE) MCG/ACT inhaler Inhale 2 puffs into the lungs every 6 (six) hours as needed for wheezing or shortness of breath. 1 Inhaler 2  . Cholecalciferol (PA VITAMIN D-3) 2000 UNITS CAPS Take 2,000 Units by mouth daily.     . divalproex (DEPAKOTE) 250 MG DR tablet Take 1 tablet (250 mg total) by mouth every 12 (twelve) hours. 60 tablet 0  . docusate sodium (COLACE) 100 MG capsule Take 100 mg by mouth 2 (two) times daily.    . famotidine (PEPCID) 20 MG tablet Take 20 mg by mouth daily.    Marland Kitchen  HYDROcodone-acetaminophen (NORCO) 10-325 MG per tablet Take 1 tablet by mouth every 6 (six) hours as needed (pain).     . metoprolol tartrate (LOPRESSOR) 25 MG tablet TAKE 1/2 TABLET BY MOUTH TWICE DAILY 30 tablet 3  . nitroGLYCERIN (NITROSTAT) 0.4 MG SL tablet Place 1 tablet (0.4 mg total) under the tongue every 5 (five) minutes as needed for chest pain (CP or SOB). (Patient  taking differently: Place 0.4 mg under the tongue every 5 (five) minutes as needed (chest pain or shortness of breath). ) 25 tablet 3  . OXcarbazepine (TRILEPTAL) 150 MG tablet Take 3 tablets (450 mg total) by mouth 3 (three) times daily. 270 tablet 0  . simvastatin (ZOCOR) 40 MG tablet Take 40 mg by mouth daily with supper.      No current facility-administered medications for this visit.    PAST MEDICAL HISTORY: Past Medical History  Diagnosis Date  . HTN (hypertension)   . CAD (coronary artery disease)     Premature; stents + known distal RCA/PDA occlusion  . MI (myocardial infarction) ([place]) 2009    "just had arm pain; never any chest pain"  . Exertional dyspnea 10/07/2011  . 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 Headache(784.0) 10/07/2011     a couple per month  . Pneumonia ~ 2010    "2 years in a row; May both times"  . 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 Chronic lower back pain     Still with recurrent left sided LBP with paresthesias down left leg --primarily with activities: pain pill use is avg <90 tabs per mo  . Urethral stricture since 1981    Recurrent dilation has been required [facility] urology)  . History of adenomatous polyp of colon 02/2009;02/2014    Recall 5 yrs ([facility])  . COPD (chronic obstructive pulmonary disease) ([place])     NOTED on CT chest 11/2013 done for lung ca screening.  Hosp for acute exac 11/2014.  Marland Kitchen Hyperlipidemia   . Nephrolithiasis 03/2014    Bilateral (38mm) nonobstructing  . Tobacco abuse   . Seizures ([place])     PAST SURGICAL HISTORY: Past Surgical History  Procedure Laterality Date  . Craniotomy for avm  1981    Dx'd after pt began having seizures.  . Tonsillectomy      "I was little"  . Lumbar disc surgery  2008; 2009    Dr. Joya Salm  . Coronary angioplasty with stent placement  2009    3 DES to LCx; still with known distal RCA/PDA occlusion.  EF 60% by LV-gram.  .  Colonoscopy w/ polypectomy  02/2009;02/2014    Recall 5 yr (aden poly, hyperplast polyp, int and ext hemorr).  Next recall is 02/2019.  . Cardiac catheterization  2011    [facility], records unavailable: pt reports "normal".  . Transthoracic echocardiogram  12/07/14    Normal LV size/fxn, EF 60%, normal valves.    FAMILY HISTORY: Family History  Problem Relation Age of Onset  . Hyperlipidemia Mother   . Cancer Father     Mesothelioma  . Hypertension Father   . Stroke Neg Hx   . Heart attack Paternal Uncle     In 34s too    SOCIAL HISTORY:  Social History   Social History  . Marital Status: Married    Spouse Name: [name]  . Number of Children: 2  . Years of Education: 10 th   Occupational History  .  retired   Social History Main Topics  . Smoking status: Former Smoker -- 3.00 packs/day for 27 years    Types: Cigarettes    Quit date: 02/08/2001  . Smokeless tobacco: Never Used  . Alcohol Use: 0.6 oz/week    1 Cans of beer per week     Comment: 3-4 times a week  . Drug Use: No  . Sexual Activity: Yes   Other Topics Concern  . Not on file   Social History Narrative   Patient lives at home with his wife [name]).   Two adult children.   Retired from [employer] approx age 70 due to medical reasons (disabled due to memory issues, seizure d/o and chronic LBP).   Education 10th grade.   Right handed.   Caffeine two cups of tea and two cups of coffee.   Tobacco 45 pack-yr hx, quit approx age 54.  Alcohol: 1-2 beers per night.     No hx of alc or drug problems.   No exercise.   Diet: regular     PHYSICAL EXAM   There were no vitals filed for this visit.  Not recorded      There is no weight on file to calculate BMI.  PHYSICAL EXAMNIATION:  Gen: NAD, conversant, well nourised, obese, well groomed                     Cardiovascular: Regular rate rhythm, no peripheral edema, warm, nontender. Eyes: Conjunctivae clear without exudates or hemorrhage Neck: Supple,  no carotid bruise. Pulmonary: Clear to auscultation bilaterally   NEUROLOGICAL EXAM:  MENTAL STATUS: Speech:    Speech is normal; fluent and spontaneous with normal comprehension.  Cognition:     Orientation to time, place and person     Normal recent and remote memory     Normal Attention span and concentration     Normal Language, naming, repeating,spontaneous speech     Fund of knowledge   CRANIAL NERVES: CN II: Visual fields are full to confrontation. Fundoscopic exam is normal with sharp discs and no vascular changes. Pupils are round equal and briskly reactive to light. CN III, IV, VI: extraocular movement are normal. No ptosis.End gaze horizontal nystagmus CN V: Facial sensation is intact to pinprick in all 3 divisions bilaterally. Corneal responses are intact.  CN VII: Face is symmetric with normal eye closure and smile. CN VIII: Hearing is normal to rubbing fingers CN IX, X: Palate elevates symmetrically. Phonation is normal. CN XI: Head turning and shoulder shrug are intact CN XII: Tongue is midline with normal movements and no atrophy.  MOTOR: There is no pronator drift of out-stretched arms. Muscle bulk and tone are normal. Muscle strength is normal.  REFLEXES: Reflexes are 2+ and symmetric at the biceps, triceps, knees, and ankles. Plantar responses are flexor.  SENSORY: Intact to light touch, pinprick, position sense, and vibration sense are intact in fingers and toes.  COORDINATION: Rapid alternating movements and fine finger movements are intact. There is no dysmetria on finger-to-nose and heel-knee-shin.    GAIT/STANCE: Posture is normal. Gait is steady with normal steps, base, arm swing, and turning. Heel and toe walking are normal. Tandem gait is normal.  Romberg is absent.   DIAGNOSTIC DATA (LABS, IMAGING, TESTING) - I reviewed patient records, labs, notes, testing and imaging myself where available.   ASSESSMENT AND PLAN  Jaason Tonn Candella is a 54  y.o. male   Complex partial seizure History of right frontal AVM, status  post right frontal craniectomy Recurrent right frontal area intracranial bleeding Chronic headaches   Will keep Trileptal at 150 mg 4 tablets twice a day= 600 mg twice a day, will check level at next visit  Add on Depakote ER 500 mg every night  Return to clinic in February   Marcial Pacas, M.D. Ph.D.  [facility] [address], [place], [place] [address] Ph: [phone] Fax: [phone]  [id number] Thurston Hole, MD

## 2014-12-31 NOTE — Sedation Documentation (Signed)
Pre procedure pedAL PULSES 1t

## 2015-01-01 ENCOUNTER — Encounter: Payer: Self-pay | Admitting: Family Medicine

## 2015-01-01 ENCOUNTER — Ambulatory Visit (INDEPENDENT_AMBULATORY_CARE_PROVIDER_SITE_OTHER): Payer: Medicare HMO | Admitting: Family Medicine

## 2015-01-01 VITALS — BP 125/85 | HR 66 | Temp 98.6°F | Resp 16 | Ht 71.0 in | Wt 242.0 lb

## 2015-01-01 DIAGNOSIS — J441 Chronic obstructive pulmonary disease with (acute) exacerbation: Secondary | ICD-10-CM

## 2015-01-01 DIAGNOSIS — I2510 Atherosclerotic heart disease of native coronary artery without angina pectoris: Secondary | ICD-10-CM | POA: Diagnosis not present

## 2015-01-01 DIAGNOSIS — I629 Nontraumatic intracranial hemorrhage, unspecified: Secondary | ICD-10-CM | POA: Diagnosis not present

## 2015-01-01 NOTE — Progress Notes (Signed)
Pre visit review using our clinic review tool, if applicable. No additional management support is needed unless otherwise documented below in the visit note. 

## 2015-01-01 NOTE — Telephone Encounter (Signed)
Test results still pending.

## 2015-01-01 NOTE — Progress Notes (Signed)
OFFICE VISIT  01/01/2015   CC:  Chief Complaint  Patient presents with  . Follow-up    ER visit     HPI:    Patient is a 54 y.o. Caucasian male who presents for 3 wk f/u COPD exacerbation. Last visit I put him on a 16d steroid taper and referred him to pulmonology.  He had appt arranged at [facility] pulm but had to reschedule due to illness-see below.  He feels much improved regarding cough and breathing.  Has not had to use rescue inhaler much lately.  In the interim he has had a seizure and presented to the ED where he was admitted and found on imaging to have new R frontal lobe small area of hemorrhage.  His ASA was stopped.  His sodium was a little low so his carbamazapine dose was adjusted down some and depakote was added.  Still having HA's daily in frontal/top of head, tylenol helps some and vicodin helps better.  Not having any NEW neuro complaints: still with some intermittent L lower facial tingling/numbness.   No further seizures since 12/24/14 when he presented to the ED: describes having involuntary L arm movement and head movement and inability to speak at that time.  No LOC.  No loss of bowel/bladder control.  This was different than his normal petit mal seizures. He had a CT angiogram which showed no stenosis or new blood vessel abnormality.  No recurrent AVM, no aneurism.   Past Medical History  Diagnosis Date  . HTN (hypertension)   . CAD (coronary artery disease)     Premature; stents + known distal RCA/PDA occlusion  . MI (myocardial infarction) ([place]) 2009    "just had arm pain; never any chest pain"  . Exertional dyspnea 10/07/2011  . 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 Headache(784.0) 10/07/2011     a couple per month  . Pneumonia ~ 2010    "2 years in a row; May both times"  . 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 Chronic lower back pain     Still with recurrent left sided LBP with paresthesias down left leg --primarily  with activities: pain pill use is avg <90 tabs per mo  . Urethral stricture since 1981    Recurrent dilation has been required [facility] urology)  . History of adenomatous polyp of colon 02/2009;02/2014    Recall 5 yrs ([facility])  . COPD (chronic obstructive pulmonary disease) ([place])     NOTED on CT chest 11/2013 done for lung ca screening.  Hosp for acute exac 11/2014.  Marland Kitchen Hyperlipidemia   . Nephrolithiasis 03/2014    Bilateral (63mm) nonobstructing  . Tobacco abuse   . Seizures [facility])     Past Surgical History  Procedure Laterality Date  . Craniotomy for avm  1981    Dx'd after pt began having seizures.  . Tonsillectomy      "I was little"  . Lumbar disc surgery  2008; 2009    Dr. Joya Salm  . Coronary angioplasty with stent placement  2009    3 DES to LCx; still with known distal RCA/PDA occlusion.  EF 60% by LV-gram.  . Colonoscopy w/ polypectomy  02/2009;02/2014    Recall 5 yr (aden poly, hyperplast polyp, int and ext hemorr).  Next recall is 02/2019.  . Cardiac catheterization  2011    [facility], records unavailable: pt reports "normal".  . Transthoracic echocardiogram  12/07/14  Normal LV size/fxn, EF 60%, normal valves.    Outpatient Prescriptions Prior to Visit  Medication Sig Dispense Refill  . acetaminophen (TYLENOL) 500 MG tablet Take 1,000 mg by mouth every 6 (six) hours as needed for headache.    . albuterol (PROVENTIL HFA;VENTOLIN HFA) 108 (90 BASE) MCG/ACT inhaler Inhale 2 puffs into the lungs every 6 (six) hours as needed for wheezing or shortness of breath. 1 Inhaler 2  . Cholecalciferol (PA VITAMIN D-3) 2000 UNITS CAPS Take 2,000 Units by mouth daily.     . divalproex (DEPAKOTE ER) 500 MG 24 hr tablet Take 1 tablet (500 mg total) by mouth at bedtime. 30 tablet 11  . docusate sodium (COLACE) 100 MG capsule Take 100 mg by mouth 2 (two) times daily.    . famotidine (PEPCID) 20 MG tablet Take 20 mg by mouth daily.    Marland Kitchen HYDROcodone-acetaminophen (NORCO)  10-325 MG per tablet Take 1 tablet by mouth every 6 (six) hours as needed (pain).     . metoprolol tartrate (LOPRESSOR) 25 MG tablet TAKE 1/2 TABLET BY MOUTH TWICE DAILY 30 tablet 3  . nitroGLYCERIN (NITROSTAT) 0.4 MG SL tablet Place 1 tablet (0.4 mg total) under the tongue every 5 (five) minutes as needed for chest pain (CP or SOB). (Patient taking differently: Place 0.4 mg under the tongue every 5 (five) minutes as needed (chest pain or shortness of breath). ) 25 tablet 3  . OXcarbazepine (TRILEPTAL) 150 MG tablet Take 3 tablets (450 mg total) by mouth 3 (three) times daily. 270 tablet 0  . simvastatin (ZOCOR) 40 MG tablet Take 40 mg by mouth daily with supper.      No facility-administered medications prior to visit.    No Known Allergies  ROS As per HPI  PE: Blood pressure 125/85, pulse 66, temperature 98.6 F (37 C), temperature source Oral, resp. rate 16, height 5\' 11"  (1.803 m), weight 242 lb (109.77 kg), SpO2 94 %. Gen: Alert, well appearing.  Patient is oriented to person, place, time, and situation. [id number]: no injection, icteris, swelling, or exudate.  EOMI, PERRLA. Mouth: lips without lesion/swelling.  Oral mucosa pink and moist. Oropharynx without erythema, exudate, or swelling.  CV: RRR, no m/r/g.   LUNGS: CTA bilat, nonlabored resps, good aeration in all lung fields. Neuro: CN 2-12 intact bilaterally, strength 5/5 in proximal and distal upper extremities and lower extremities bilaterally.  No sensory deficits.  No tremor.  No ataxia.  Upper extremity and lower extremity DTRs symmetric.   LABS:  None today Lab Results  Component Value Date   WBC 8.3 12/28/2014   HGB 13.8 12/28/2014   HCT 40.7 12/28/2014   MCV 88.1 12/28/2014   PLT 144* 12/28/2014     Chemistry      Component Value Date/Time   NA 132* 12/28/2014 0213   NA 134 08/09/2013 1127   K 3.5 12/28/2014 0213   CL 102 12/28/2014 0213   CO2 23 12/28/2014 0213   BUN 8 12/28/2014 0213   BUN 10 08/09/2013  1127   CREATININE 0.81 12/28/2014 0213   CREATININE 0.91 12/05/2014 1039      Component Value Date/Time   CALCIUM 8.6* 12/28/2014 0213   ALKPHOS 71 12/27/2014 1444   AST 19 12/27/2014 1444   ALT 32 12/27/2014 1444   BILITOT 0.6 12/27/2014 1444      IMPRESSION AND PLAN:  1) COPD, recent exacerbation resolved. First exacerbation.  New dx COPD based on CT changes and this illness. Pulm referral  made and he'll either go to his [place] pulm appt or reschedule with [facility] pulm dept. Continue albuterol HFA qid prn.  2) Recent R frontal lobe intracerebral hemorrhage, with HA's lately presumed to be coming from this. Intermittent L lower facial tingling/numbness was also occuring but seems to have stopped currently. Seizure 12/27/14 likely due to this bleeding, as it was different from his typical petit mal seizure. His seizure meds have been changed/adjusted by neurologist and he has been taken off of his aspirin. He has plans to f/u with neurology and get repeat of his CT scan February 2017. Until then he'll remain off of ASA.  3) CAD, with stent placement approx 7 yrs ago: asymptomatic.  Continue beta blocker and statin but he'll have to remain off ASA for now due to recent intracranial hemorrhage.  An After Visit Summary was printed and given to the patient.  FOLLOW UP: Return in about 6 months (around 07/01/2015) for annual CPE (fasting).

## 2015-01-01 NOTE — Progress Notes (Signed)
I have reviewed and agreed above plan. 

## 2015-01-05 ENCOUNTER — Encounter (HOSPITAL_COMMUNITY): Payer: Self-pay

## 2015-01-06 NOTE — Telephone Encounter (Signed)
The results are final and the ordering physician has called the family to discuss the results with them.

## 2015-01-30 ENCOUNTER — Other Ambulatory Visit: Payer: Self-pay | Admitting: *Deleted

## 2015-01-30 MED ORDER — METOPROLOL TARTRATE 25 MG PO TABS: 12.5000 mg | ORAL_TABLET | Freq: Two times a day (BID) | ORAL | Status: DC

## 2015-01-30 NOTE — Telephone Encounter (Signed)
RF request for metoprolol LOV: 01/01/15  Next ov: 07/14/15 Last written: 09/04/14 #30 w/ 3RF

## 2015-02-18 ENCOUNTER — Other Ambulatory Visit: Payer: Self-pay | Admitting: Family Medicine

## 2015-02-18 MED ORDER — SIMVASTATIN 40 MG PO TABS: 40.0000 mg | ORAL_TABLET | Freq: Every day | ORAL | Status: DC

## 2015-02-20 ENCOUNTER — Encounter: Payer: Self-pay | Admitting: Pulmonary Disease

## 2015-02-20 ENCOUNTER — Ambulatory Visit (INDEPENDENT_AMBULATORY_CARE_PROVIDER_SITE_OTHER): Payer: Medicare HMO | Admitting: Pulmonary Disease

## 2015-02-20 VITALS — BP 110/72 | HR 69 | Ht 70.0 in | Wt 250.0 lb

## 2015-02-20 DIAGNOSIS — R0600 Dyspnea, unspecified: Secondary | ICD-10-CM

## 2015-02-20 DIAGNOSIS — R0609 Other forms of dyspnea: Secondary | ICD-10-CM

## 2015-02-20 MED ORDER — TIOTROPIUM BROMIDE MONOHYDRATE 2.5 MCG/ACT IN AERS: 1.0000 | INHALATION_SPRAY | Freq: Every day | RESPIRATORY_TRACT | Status: DC

## 2015-02-20 NOTE — Patient Instructions (Signed)
Lung capacity is about 65% Trial of spiriva once daily - call us for Rx if this works You may have obstructive sleep apnea

## 2015-02-20 NOTE — Assessment & Plan Note (Signed)
The reason for dyspnea is not very clear on today's evaluation. He does have significant history of smoking  CT scan shows mild emphysema-however spirometry fairly shows restrictive lung disease,there is no evidence of airway obstruction. There is some coving of the flow volume loop-hence have given him a treatment trial of Spiriva. He will call back if this is helpful and we will provide him with a Prescription. He will continue using albuterol as needed  i feel it is very likely that he has obstructive sleep apnea due to his history of orthopnea but he does not want to get tested at this time

## 2015-02-20 NOTE — Progress Notes (Signed)
Subjective:    Patient ID: Peter Barnett, male    DOB: [birth year], 55 y.o.   MRN: [id number]  HPI  Chief Complaint  Patient presents with  . Consult    referred by Peter Barnett for SOB. Pt SOB any activity and sinus congestion. Denies any chest congestin/tightness, sinus drainage, fever, nausea or vomiting.    55 year old ex-smoker presents for evaluation of dyspnea and exertion. 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 He has seizure disorder, multiple back surgeries. He has cardiac stents (Peter Barnett) , echo in 11/2014 showed normal LV function. Is accompanied by Peter wife who provides most of the history. He reports dyspnea on walking long distances or climbing stairs. THere is no history of wheezing or frequent chest colds. He denies a chronic cough.albuterol provides some relief He smoked about 50 pack years before he quit in 2003.   He reports a brief hospitalization in 11/2014 for pneumonia-review of chest x-ray shows bibasal atelectasis He has been sleeping in the recliner for the past 6 months due to feeling of choking. 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 HTN (hypertension)   . CAD (coronary artery disease)     Premature; stents + known distal RCA/PDA occlusion  . MI (myocardial infarction) (Peter Barnett) 2009    "just had arm pain; never any chest pain"  . Exertional dyspnea 10/07/2011  . Short-term memory loss 10/07/2011    "post brain OR & from Peter seizures"  . Headache(784.0) 10/07/2011     a couple per month  . Pneumonia ~ 2010    "2 years in a row; May both times"    . Petit-mal epilepsy (Peter Barnett) 1981-present  . Chronic lower back pain     Still with recurrent left sided LBP with paresthesias down left leg --primarily with activities: pain pill use is avg <90 tabs per mo  . Urethral stricture since 1981    Recurrent dilation has been required [facility] u[facility])  . History of adenomatous polyp of colon 02/2009;02/2014    Recall 5 yrs (Peter Barnett)  . COPD (chronic obstructive pulmonary disease) (Peter Barnett)     NOTED on CT chest 11/2013 done for lung ca screening.  Hosp for acute exac 11/2014.  Marland Kitchen Hyperlipidemia   . Nephrolithiasis 03/2014    Bilateral (77mm) nonobstructing  . Tobacco abuse   . Intracranial hemorrhage (Peter Barnett) 12/27/14    small acute hemorrhage in gliotic medial R frontal lobe   Past Surgical History  Procedure Laterality Date  . Craniotomy for avm  1981    Dx'd after pt began having seizures.  . Tonsillectomy      "I was little"  . Lumbar disc surgery  2008; 2009    Dr. Joya Barnett  . Coronary angioplasty with stent placement  2009    3 DES to LCx; still with known distal RCA/PDA occlusion.  EF 60% by LV-gram.  . Colonoscopy w/ polypectomy  02/2009;02/2014    Recall 5 yr (aden poly, hyperplast polyp, int and ext hemorr).  Next recall is 02/2019.  . Cardiac catheterization  2011    Peter Barnett, records unavailable:  pt reports "normal".  . Transthoracic echocardiogram  12/07/14    Normal LV size/fxn, EF 60%, normal valves.  . Ct angiogram (cerebral)  12/31/14    NORMAL--plan per neuro is to repeat this in 3 mo     No Known Allergies  Social History   Social History  . Marital Status: Married    Spouse Name: Peter Barnett  . Number of Children: 2  . Years of Education: 10 th   Occupational History  . retired      retired   Social History Main Topics  . Smoking status: Former Smoker -- 3.00 packs/day for 27 years    Types: Cigarettes    Quit date: 02/08/2001  . Smokeless tobacco: Never Used  . Alcohol Use: 0.6 oz/week    1 Cans  of beer per week     Comment: 3-4 times a week  . Drug Use: No  . Sexual Activity: Yes   Other Topics Concern  . Not on file   Social History Narrative   Patient lives at home with Peter Barnett).   Two adult children.   Retired from [employer] approx age 73 due to medical reasons (disabled due to memory issues, seizure d/o and chronic LBP).   Education 10th grade.   Right handed.   Caffeine two cups of tea and two cups of coffee.   Tobacco 45 pack-yr hx, quit approx age 12.  Alcohol: 1-2 beers per night.     No hx of alc or drug problems.   No exercise.   Diet: regular    Family History  Problem Relation Age of Onset  . Hyperlipidemia Mother   . Cancer Father     Mesothelioma  . Hypertension Father   . Stroke Neg Hx   . Heart attack Paternal Uncle     In 72s too  . Asthma Father      Review of Systems  Constitutional: Positive for fatigue. Negative for fever.  HENT: Positive for sinus pressure. Negative for congestion, dental problem, ear pain, mouth sores, nosebleeds, postnasal drip, rhinorrhea, sneezing, sore throat and trouble swallowing.   Eyes: Negative for redness and itching.  Respiratory: Positive for shortness of breath. Negative for cough, chest tightness and wheezing.   Cardiovascular: Negative for palpitations and leg swelling.  Gastrointestinal: Negative for nausea and vomiting.  Genitourinary: Negative for dysuria.  Musculoskeletal: Negative for joint swelling.  Skin: Negative for rash.  Neurological: Positive for headaches.  Hematological: Does not bruise/bleed easily.  Psychiatric/Behavioral: Negative for dysphoric mood.       Objective:   Physical Exam   Gen. Pleasant, obese, in no distress, normal affect ENT - no lesions, no post nasal drip, class 2-3 airway Neck: No JVD, no thyromegaly, no carotid bruits Lungs: no use of accessory muscles, no dullness to percussion, decreased without rales or rhonchi  Cardiovascular: Rhythm regular, heart  sounds  normal, no murmurs or gallops, no peripheral edema Abdomen: soft and non-tender, no hepatosplenomegaly, BS normal. Musculoskeletal: No deformities, no cyanosis or clubbing Neuro:  alert, non focal, no tremors        Assessment & Plan:

## 2015-02-24 ENCOUNTER — Encounter: Payer: Self-pay | Admitting: Family Medicine

## 2015-02-25 ENCOUNTER — Telehealth: Payer: Self-pay | Admitting: Neurology

## 2015-02-25 ENCOUNTER — Other Ambulatory Visit: Payer: Self-pay | Admitting: *Deleted

## 2015-02-25 MED ORDER — TRAMADOL HCL 50 MG PO TABS: 50.0000 mg | ORAL_TABLET | Freq: Three times a day (TID) | ORAL | Status: DC | PRN

## 2015-02-25 MED ORDER — BUTALBITAL-APAP-CAFFEINE 50-325-40 MG PO TABS: 1.0000 | ORAL_TABLET | Freq: Four times a day (QID) | ORAL | Status: DC | PRN

## 2015-02-25 NOTE — Telephone Encounter (Signed)
He has been having intermittent headaches for the last 3-4 weeks.  Headaches are occurring 3-4 days per week, lasting around 2 hours each.  He has been using Tylenol for pain with only minimal relief. His current pending appt is 03/31/15.

## 2015-02-25 NOTE — Telephone Encounter (Signed)
NKDA listed in chart.  Patient remembered that Tramadol has caused him to have "staring spells" in the past.  I have added it to his allergy list.

## 2015-02-25 NOTE — Telephone Encounter (Signed)
Patient's wife is calling and states that her husband is having headaches and so bad he has been awakened from sleep a few times recently.  She states the pain is in the same area as the brain bleed before and would like a call back.  Thanks!

## 2015-02-25 NOTE — Telephone Encounter (Signed)
Wife [name] called to advise Tramadol was sent into pharmacy and patient can't take Tramadol.

## 2015-02-25 NOTE — Telephone Encounter (Signed)
Faxed and confirmed to [facility] at [phone].  Patient aware and instructed to call back with any further concerns.

## 2015-02-25 NOTE — Telephone Encounter (Signed)
I have called in tramadol 50mg  q8 hours prn for headaches, please let patient know

## 2015-02-25 NOTE — Addendum Note (Signed)
Addended by: Marcial Pacas on: 02/25/2015 12:31 PM   Modules accepted: Orders

## 2015-02-25 NOTE — Telephone Encounter (Addendum)
Per vo by Dr. Felecia Shelling - provide Fioricet #12 x 0 and keep appt with Dr. Krista Blue - pt aware.  Called the pharmacy to let them know - they did not have Tramadol as an allergy either - it has been added to his profile there.

## 2015-03-17 ENCOUNTER — Telehealth: Payer: Self-pay | Admitting: *Deleted

## 2015-03-17 ENCOUNTER — Other Ambulatory Visit: Payer: Self-pay | Admitting: Neurology

## 2015-03-17 MED ORDER — BUTALBITAL-APAP-CAFFEINE 50-325-40 MG PO TABS: 1.0000 | ORAL_TABLET | Freq: Four times a day (QID) | ORAL | Status: DC | PRN

## 2015-03-17 NOTE — Telephone Encounter (Signed)
Wife Peter Barnett called to check status of refill request for butalbital-acetaminophen-caffeine (FIORICET, ESGIC) 50-325-40 MG tablet that [facility] faxed last week.

## 2015-03-17 NOTE — Telephone Encounter (Signed)
Rx for Fioricet faxed and confirmed to pharmacy at [phone].

## 2015-03-20 ENCOUNTER — Telehealth: Payer: Self-pay | Admitting: Neurology

## 2015-03-20 NOTE — Telephone Encounter (Signed)
Patient's wife is calling. The patient has had numbness around his mouth on the left side since yesterday and today his left arm is hurting. I advised that he should go to the ER but patient's wife said he had rather have a CT scan @ [facility] in [place] if an order can be placed. Please call to discuss.

## 2015-03-20 NOTE — Telephone Encounter (Addendum)
He complains of left mouth numbness, left arm pain, sharp pain, his left side symptoms lasted about one hours, no staring spell, no seizure like activities.  He did miss Depakote ER 500mg  in Feb 6th, He has frequent headaches, Fioricet has been helpful,  I went over four-vessel angiogram result with patient wife  Angiographically no evidence of arteriovenous shunting to suggest residual, or recurrent arteriovenous malformation.  No angiographic evidence of intracranial aneurysms or dissections.  Venous outflow normal hemodynamically.  The results of the arteriogram were reviewed with the patient and the patient's family. A follow-up CT angiogram of the brain was recommended in 3 months time.  Advised him keep follow-up appointment March 31 2015, be compliant with his medications, document all event

## 2015-03-20 NOTE — Telephone Encounter (Signed)
Spoke to [employer] (Federick's wife) - she would like to review testing with Dr. Krista Blue.  He missed one dose of Depakote Monday evening (says it is the only dose he has ever missed).  Reports all his symptoms have now resolved.  Please call back to [phone].

## 2015-03-21 DIAGNOSIS — G9389 Other specified disorders of brain: Secondary | ICD-10-CM | POA: Diagnosis not present

## 2015-03-21 DIAGNOSIS — Z8774 Personal history of (corrected) congenital malformations of heart and circulatory system: Secondary | ICD-10-CM | POA: Diagnosis not present

## 2015-03-21 DIAGNOSIS — R200 Anesthesia of skin: Secondary | ICD-10-CM | POA: Diagnosis not present

## 2015-03-25 DIAGNOSIS — D485 Neoplasm of uncertain behavior of skin: Secondary | ICD-10-CM | POA: Diagnosis not present

## 2015-03-27 DIAGNOSIS — Q2730 Arteriovenous malformation, site unspecified: Secondary | ICD-10-CM | POA: Diagnosis not present

## 2015-03-31 ENCOUNTER — Ambulatory Visit: Payer: Medicare HMO | Admitting: Neurology

## 2015-04-21 DIAGNOSIS — Z6835 Body mass index (BMI) 35.0-35.9, adult: Secondary | ICD-10-CM | POA: Diagnosis not present

## 2015-04-21 DIAGNOSIS — M4806 Spinal stenosis, lumbar region: Secondary | ICD-10-CM | POA: Diagnosis not present

## 2015-04-23 DIAGNOSIS — I629 Nontraumatic intracranial hemorrhage, unspecified: Secondary | ICD-10-CM | POA: Diagnosis not present

## 2015-04-23 DIAGNOSIS — Q282 Arteriovenous malformation of cerebral vessels: Secondary | ICD-10-CM | POA: Diagnosis not present

## 2015-05-08 ENCOUNTER — Telehealth: Payer: Self-pay | Admitting: Neurology

## 2015-05-08 NOTE — Telephone Encounter (Signed)
Wife is calling to schedule appointment with Dr. Krista Blue ASAP for continued facial numbness, states patient has history of brain bleed, wife does not want patient to see nurse practitioner, states when she talked to Dr. Brett Fairy during a time she was on call and had called in, Dr. Brett Fairy advised her, patient should see doctor not nurse practitioner. Patient has a scheduled appointment with Cecille Rubin 08/18/15.

## 2015-05-08 NOTE — Telephone Encounter (Signed)
Spoke with wife who stated pt had to cancel 04/28/15 FU with Dr Krista Blue to go to neurosurgeon in [place]. She stated his facial numbness is ongoing problem, but his memory has worsened. She would like earlier FU than July. Scheduled for 06/10/15, but wife would like call back if earlier FU available. Informed her would have Sharyn Lull RN call her next week. She verbalized understanding, appreciation.

## 2015-05-12 NOTE — Telephone Encounter (Signed)
Spoke to patient - his appt has been moved to an earlier date - offered several times this week and they decided on 05/26/15.  He will call with any further concerns - told him we would be glad to work him in earlier.

## 2015-05-12 NOTE — Telephone Encounter (Signed)
Left message for [name] (pt's wife on [employer]) to call back.  We can get his appt moved to this week - there are several open times available that may be convenient for him.

## 2015-05-22 ENCOUNTER — Ambulatory Visit: Payer: Medicare HMO | Admitting: Adult Health

## 2015-05-26 ENCOUNTER — Telehealth: Payer: Self-pay | Admitting: *Deleted

## 2015-05-26 ENCOUNTER — Ambulatory Visit: Payer: Self-pay | Admitting: Neurology

## 2015-05-26 NOTE — Telephone Encounter (Signed)
Patient called to reschedule the morning of his appointment.

## 2015-06-10 ENCOUNTER — Ambulatory Visit: Payer: Self-pay | Admitting: Neurology

## 2015-06-17 ENCOUNTER — Other Ambulatory Visit: Payer: Self-pay

## 2015-06-17 ENCOUNTER — Ambulatory Visit (INDEPENDENT_AMBULATORY_CARE_PROVIDER_SITE_OTHER): Payer: Medicare HMO | Admitting: Neurology

## 2015-06-17 ENCOUNTER — Encounter: Payer: Self-pay | Admitting: Family Medicine

## 2015-06-17 ENCOUNTER — Encounter: Payer: Self-pay | Admitting: Neurology

## 2015-06-17 VITALS — BP 125/80 | HR 60 | Ht 70.0 in | Wt 246.8 lb

## 2015-06-17 DIAGNOSIS — Z9889 Other specified postprocedural states: Secondary | ICD-10-CM | POA: Diagnosis not present

## 2015-06-17 DIAGNOSIS — R569 Unspecified convulsions: Secondary | ICD-10-CM | POA: Diagnosis not present

## 2015-06-17 DIAGNOSIS — Q282 Arteriovenous malformation of cerebral vessels: Secondary | ICD-10-CM | POA: Diagnosis not present

## 2015-06-17 DIAGNOSIS — G43009 Migraine without aura, not intractable, without status migrainosus: Secondary | ICD-10-CM

## 2015-06-17 DIAGNOSIS — R413 Other amnesia: Secondary | ICD-10-CM | POA: Diagnosis not present

## 2015-06-17 DIAGNOSIS — G43909 Migraine, unspecified, not intractable, without status migrainosus: Secondary | ICD-10-CM | POA: Insufficient documentation

## 2015-06-17 DIAGNOSIS — M5416 Radiculopathy, lumbar region: Secondary | ICD-10-CM | POA: Insufficient documentation

## 2015-06-17 MED ORDER — OXCARBAZEPINE 600 MG PO TABS: 600.0000 mg | ORAL_TABLET | Freq: Two times a day (BID) | ORAL | Status: DC

## 2015-06-17 MED ORDER — DIVALPROEX SODIUM ER 500 MG PO TB24: 1000.0000 mg | ORAL_TABLET | Freq: Every day | ORAL | Status: DC

## 2015-06-17 MED ORDER — BUTALBITAL-APAP-CAFFEINE 50-325-40 MG PO TABS: 1.0000 | ORAL_TABLET | Freq: Four times a day (QID) | ORAL | Status: DC | PRN

## 2015-06-17 MED ORDER — SIMVASTATIN 40 MG PO TABS: 40.0000 mg | ORAL_TABLET | Freq: Every day | ORAL | Status: DC

## 2015-06-17 NOTE — Progress Notes (Signed)
Chief Complaint  Patient presents with  . Memory Loss    MMSE 21/30 - 12 animals. He is here with his wife, Peter Barnett, who is concerned about his worsening memory.  . Seizures    Reports no seizure activity since last seen.      PATIENT: Peter Barnett DOB: [birth year]  Chief Complaint  Patient presents with  . Memory Loss    MMSE 21/30 - 12 animals. He is here with his wife, Peter Barnett, who is concerned about his worsening memory.  . Seizures    Reports no seizure activity since last seen.     HISTORICAL  Peter Barnett is a 55 years old male, accompanied by his wife,   He had PMH of right frontal AVM, s/p right frontal craniotomy, hypertension, hyperlipidemia, COPD, gerd, CAD, s/p of stent 2009, seizure disorder, chronic back pain, kidney stone, who presents with seizure, HA and intracranial bleeding.  He suffered complex partial seizure, in 1981, was diagnosed with right frontal AV malformation, had a right frontal craniotomy by Dr. Lesly Dukes at [facility] in [facility], his seizure has been under good control, but he continued to have occasional staring spells, which was considered due to simple partial or complex partial seizure, over the years, he has tried different medications, Dilantin caused gingiva hypertrophy, Keppra cause mood disorder, Topamax complains of memory trouble,   He was also seen by Dr. Joya Salm for his lumbar stenosis, he had 2 lumbar surgery, most recent one was in 2010,  He is receiving some epidural injection for his recurrent chronic low back pain which has been beneficial   He had 10 years of education, quit high school because he never was good at school, and did not like school, works at [employer] job all his life, went on disability since 1981 after his right frontal craniotomy, he stays at home, doing household chores, patient stated he never had good memory all his life, wife noticed worsening memory over the past few years. EEG was normal at 2014  He  also has frequent headaches,  He presented with recurrent seizure in December 07 2014, left arm numbness, intermittent left facial numbness, December 24 2014, left arm uncontrollable shaking, mild confusion, difficulty talking,  CAT scan of the brain in December 27 2014 reviewed with patient: Small acute hemorrhage in an area of gliotic medial right frontal lobe. Chronic postoperative and ischemic changes. I also personally reviewed CT angiogram: Remote treatment for right frontal AVM. There has been right frontal craniotomy. There are multiple surgical clips as well as in the vascular embolic particles.no evidence of recurrent AVM or aneurysm on the CTA.   He was discharged home with Trileptal 150 mg 3 tablets 3 times a day, Depakote DR 250 mg twice a day, he complains of fatigue, dizziness with current dose of medications   I also reviewed laboratory in December 28 2014, low sodium 133, normal CBC with exception of low platelets 144  UPDATE May 9th 2017: I reviewed angiogram in November 2016: Angiographically no evidence of arteriovenous shunting to suggest residual, or recurrent arteriovenous malformation. No angiographic evidence of intracranial aneurysms or dissections. Venous outflow normal hemodynamically  He has no recurrent seizure, but there is recurrent episode of onset onset left facial left arm numbness lasting couple hours, He also complains of frequent headaches, often buildup quickly, vertex region moderate to severe pain, with associated light sensitivity, lasting more than 4 hours, movement made it worse, he has more than 15 headaches days  in one month He also complains of gradual worsening memory loss, forgot the trip that he has gone to He has worsening low back pain, radiating pain to left lower extremity, he was seen by Dr. Joya Salm recently, was cleared by vascular neurosurgeon Dr.Nundkumar,   I personally reviewed CT myelogram in April 2017: Regrowth of posterior elements  status post lumbar decompression worst at L4-5. Development of/worsening of central protrusion at L4-5 partially calcified. Severe spinal stenosis at the L4-5 level with RIGHT greater the LEFT L4 and L5 nerve root impingement. Central and rightward protrusion at L1-2 and foraminal and extraforaminal protrusion at L2-3 potentially affect the nerve roots at their respective levels.   REVIEW OF SYSTEMS: Full 14 system review of systems performed and notable only for shortness of back pain, memory loss, headaches, gait difficulty  ALLERGIES: Allergies  Allergen Reactions  . Tramadol Other (See Comments)    "Staring off spells"    HOME MEDICATIONS: Current Outpatient Prescriptions  Medication Sig Dispense Refill  . acetaminophen (TYLENOL) 500 MG tablet Take 1,000 mg by mouth every 6 (six) hours as needed for headache.    . albuterol (PROVENTIL HFA;VENTOLIN HFA) 108 (90 BASE) MCG/ACT inhaler Inhale 2 puffs into the lungs every 6 (six) hours as needed for wheezing or shortness of breath. 1 Inhaler 2  . butalbital-acetaminophen-caffeine (FIORICET, ESGIC) 50-325-40 MG tablet Take 1 tablet by mouth every 6 (six) hours as needed for headache. 12 tablet 0  . Cholecalciferol (PA VITAMIN D-3) 2000 UNITS CAPS Take 2,000 Units by mouth daily.     . divalproex (DEPAKOTE ER) 500 MG 24 hr tablet Take 1 tablet (500 mg total) by mouth at bedtime. 30 tablet 11  . docusate sodium (COLACE) 100 MG capsule Take 100 mg by mouth 2 (two) times daily.    . famotidine (PEPCID) 20 MG tablet Take 20 mg by mouth daily.    Marland Kitchen HYDROcodone-acetaminophen (NORCO) 10-325 MG per tablet Take 1 tablet by mouth every 6 (six) hours as needed (pain).     . metoprolol tartrate (LOPRESSOR) 25 MG tablet Take 0.5 tablets (12.5 mg total) by mouth 2 (two) times daily. 30 tablet 12  . nitroGLYCERIN (NITROSTAT) 0.4 MG SL tablet Place 1 tablet (0.4 mg total) under the tongue every 5 (five) minutes as needed for chest pain (CP or SOB). (Patient  taking differently: Place 0.4 mg under the tongue every 5 (five) minutes as needed (chest pain or shortness of breath). ) 25 tablet 3  . OXcarbazepine (TRILEPTAL) 150 MG tablet Take 3 tablets (450 mg total) by mouth 3 (three) times daily. 270 tablet 0  . simvastatin (ZOCOR) 40 MG tablet Take 1 tablet (40 mg total) by mouth daily with supper. 30 tablet 3   No current facility-administered medications for this visit.    PAST MEDICAL HISTORY: Past Medical History  Diagnosis Date  . HTN (hypertension)   . CAD (coronary artery disease)     Premature; stents + known distal RCA/PDA occlusion  . MI (myocardial infarction) ([place]) 2009    "just had arm pain; never any chest pain"  . Exertional dyspnea 10/07/2011  . 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 Headache(784.0) 10/07/2011     a couple per month  . Pneumonia ~ 2010    "2 years in a row; May both times"  . Petit-mal epilepsy ([place]) 1981-present  . Chronic lower back pain     Still with recurrent left sided LBP  with paresthesias down left leg --primarily with activities: pain pill use is avg <90 tabs per mo  . Urethral stricture since 1981    Recurrent dilation has been required [facility] urology)  . History of adenomatous polyp of colon 02/2009;02/2014    Recall 5 yrs ([facility])  . COPD (chronic obstructive pulmonary disease) ([place])     NOTED on CT chest 11/2013 done for lung ca screening.  Hosp for acute exac 11/2014.  Spirometry not supportive of this dx as of pulm eval 2017, though.  Spireva trial by Dr. Elsworth Soho 02/2015.  Marland Kitchen Hyperlipidemia   . Nephrolithiasis 03/2014    Bilateral (65mm) nonobstructing  . Tobacco abuse   . Intracranial hemorrhage ([place]) 12/27/14    small acute hemorrhage in gliotic medial R frontal lobe  . OSA (obstructive sleep apnea)     Dr. Elsworth Soho feels like he has OSA but pt declines testing as of 02/2015    PAST SURGICAL HISTORY: Past Surgical History  Procedure  Laterality Date  . Craniotomy for avm  1981    Dx'd after pt began having seizures.  . Tonsillectomy      "I was little"  . Lumbar disc surgery  2008; 2009    Dr. Joya Salm  . Coronary angioplasty with stent placement  2009    3 DES to LCx; still with known distal RCA/PDA occlusion.  EF 60% by LV-gram.  . Colonoscopy w/ polypectomy  02/2009;02/2014    Recall 5 yr (aden poly, hyperplast polyp, int and ext hemorr).  Next recall is 02/2019.  . Cardiac catheterization  2011    [facility], records unavailable: pt reports "normal".  . Transthoracic echocardiogram  12/07/14    Normal LV size/fxn, EF 60%, normal valves.  . Ct angiogram (cerebral)  12/31/14    NORMAL--plan per neuro is to repeat this in 3 mo    FAMILY HISTORY: Family History  Problem Relation Age of Onset  . Hyperlipidemia Mother   . Cancer Father     Mesothelioma  . Hypertension Father   . Stroke Neg Hx   . Heart attack Paternal Uncle     In 56s too  . Asthma Father     SOCIAL HISTORY:  Social History   Social History  . Marital Status: Married    Spouse Name: Peter Barnett  . Number of Children: 2  . Years of Education: 10 th   Occupational History  . retired      retired   Social History Main Topics  . Smoking status: Former Smoker -- 3.00 packs/day for 27 years    Types: Cigarettes    Quit date: 02/08/2001  . Smokeless tobacco: Never Used  . Alcohol Use: 0.6 oz/week    1 Cans of beer per week     Comment: 3-4 times a week  . Drug Use: No  . Sexual Activity: Yes   Other Topics Concern  . Not on file   Social History Narrative   Patient lives at home with his wife Peter Barnett).   Two adult children.   Retired from [employer] approx age 74 due to medical reasons (disabled due to memory issues, seizure d/o and chronic LBP).   Education 10th grade.   Right handed.   Caffeine two cups of tea and two cups of coffee.   Tobacco 45 pack-yr hx, quit approx age 31.  Alcohol: 1-2 beers per night.     No hx of alc or drug  problems.   No exercise.   Diet:  regular     PHYSICAL EXAM   Filed Vitals:   06/17/15 0726  BP: 125/80  Pulse: 60  Height: 5\' 10"  (1.778 m)  Weight: 246 lb 12 oz (111.925 kg)    Not recorded      Body mass index is 35.4 kg/(m^2).  PHYSICAL EXAMNIATION:  Gen: NAD, conversant, well nourised, obese, well groomed                     Cardiovascular: Regular rate rhythm, no peripheral edema, warm, nontender. Eyes: Conjunctivae clear without exudates or hemorrhage Neck: Supple, no carotid bruise. Pulmonary: Clear to auscultation bilaterally   NEUROLOGICAL EXAM:  MENTAL STATUS: Speech:    Speech is normal; fluent and spontaneous with normal comprehension.  Cognition:     Orientation to time, place and person     Normal recent and remote memory     Normal Attention span and concentration     Normal Language, naming, repeating,spontaneous speech     Fund of knowledge   CRANIAL NERVES: CN II: Visual fields are full to confrontation. Fundoscopic exam is normal with sharp discs and no vascular changes. Pupils are round equal and briskly reactive to light. CN III, IV, VI: extraocular movement are normal. No ptosis.End gaze horizontal nystagmus CN V: Facial sensation is intact to pinprick in all 3 divisions bilaterally. Corneal responses are intact.  CN VII: Face is symmetric with normal eye closure and smile. CN VIII: Hearing is normal to rubbing fingers CN IX, X: Palate elevates symmetrically. Phonation is normal. CN XI: Head turning and shoulder shrug are intact CN XII: Tongue is midline with normal movements and no atrophy.  MOTOR: There is no pronator drift of out-stretched arms. Muscle bulk and tone are normal. Muscle strength is normal.  REFLEXES: Reflexes are 2+ and symmetric at the biceps, triceps, knees, and ankles. Plantar responses are flexor.  SENSORY: Intact to light touch, pinprick, position sense, and vibration sense are intact in fingers and  toes.  COORDINATION: Rapid alternating movements and fine finger movements are intact. There is no dysmetria on finger-to-nose and heel-knee-shin.    GAIT/STANCE: Antalgic, cautious  DIAGNOSTIC DATA (LABS, IMAGING, TESTING) - I reviewed patient records, labs, notes, testing and imaging myself where available.   ASSESSMENT AND PLAN  Peter Barnett is a 55 y.o. male   Complex partial seizure History of right frontal AVM, status post right frontal craniectomy Recurrent right frontal area intracranial bleeding Chronic migraine headaches   Will keep Trileptal 600 mg twice a day  Increase Depakote ER 500 mg to 2 tablets every night  Botox injection as migraine prevention  Memory loss  Mini-Mental Status Examination 21/30  Laboratory evaluation to rule out treatable etiology  History of  lumbar stenosis  Worsening low back pain, radiating pain to left lower extremity  EMG nerve conduction study   Marcial Pacas, M.D. Ph.D.  [facility] [address], [place], [place] [address] Ph: [phone] Fax: [phone]  [id number] Thurston Hole, MD

## 2015-06-19 LAB — COMPREHENSIVE METABOLIC PANEL
A/G RATIO: 2 (ref 1.2–2.2)
ALK PHOS: 93 IU/L (ref 39–117)
ALT: 22 IU/L (ref 0–44)
AST: 16 IU/L (ref 0–40)
Albumin: 4.5 g/dL (ref 3.5–5.5)
BILIRUBIN TOTAL: 0.4 mg/dL (ref 0.0–1.2)
BUN/Creatinine Ratio: 11 (ref 9–20)
BUN: 10 mg/dL (ref 6–24)
CO2: 16 mmol/L — ABNORMAL LOW (ref 18–29)
Calcium: 9.4 mg/dL (ref 8.7–10.2)
Chloride: 102 mmol/L (ref 96–106)
Creatinine, Ser: 0.89 mg/dL (ref 0.76–1.27)
GFR calc Af Amer: 111 mL/min/{1.73_m2} (ref >59)
GFR calc non Af Amer: 96 mL/min/{1.73_m2} (ref >59)
GLOBULIN, TOTAL: 2.2 g/dL (ref 1.5–4.5)
Glucose: 107 mg/dL — ABNORMAL HIGH (ref 65–99)
POTASSIUM: 4.7 mmol/L (ref 3.5–5.2)
SODIUM: 142 mmol/L (ref 134–144)
Total Protein: 6.7 g/dL (ref 6.0–8.5)

## 2015-06-19 LAB — CBC
HEMATOCRIT: 46.6 % (ref 37.5–51.0)
Hemoglobin: 15.7 g/dL (ref 12.6–17.7)
MCH: 30.5 pg (ref 26.6–33.0)
MCHC: 33.7 g/dL (ref 31.5–35.7)
MCV: 91 fL (ref 79–97)
Platelets: 178 10*3/uL (ref 150–379)
RBC: 5.15 x10E6/uL (ref 4.14–5.80)
RDW: 14 % (ref 12.3–15.4)
WBC: 8.6 10*3/uL (ref 3.4–10.8)

## 2015-06-19 LAB — TSH: TSH: 1.73 u[IU]/mL (ref 0.450–4.500)

## 2015-06-19 LAB — VITAMIN B12: Vitamin B-12: 462 pg/mL (ref 211–946)

## 2015-06-19 LAB — VALPROIC ACID LEVEL: VALPROIC ACID LVL: 17 ug/mL — AB (ref 50–100)

## 2015-06-19 LAB — VITAMIN D 25 HYDROXY (VIT D DEFICIENCY, FRACTURES): VIT D 25 HYDROXY: 37.1 ng/mL (ref 30.0–100.0)

## 2015-06-19 LAB — 10-HYDROXYCARBAZEPINE: OXCARBAZEPINE SERPL-MCNC: 17 ug/mL (ref 10–35)

## 2015-06-20 ENCOUNTER — Encounter: Payer: Self-pay | Admitting: Family Medicine

## 2015-07-02 ENCOUNTER — Telehealth: Payer: Self-pay

## 2015-07-02 NOTE — Telephone Encounter (Signed)
Patient is on my [employer] List for 2017. Patient may be a good candidate for AWV.

## 2015-07-08 ENCOUNTER — Ambulatory Visit (INDEPENDENT_AMBULATORY_CARE_PROVIDER_SITE_OTHER): Payer: Medicare HMO | Admitting: Neurology

## 2015-07-08 ENCOUNTER — Ambulatory Visit (INDEPENDENT_AMBULATORY_CARE_PROVIDER_SITE_OTHER): Payer: Self-pay | Admitting: Neurology

## 2015-07-08 DIAGNOSIS — R413 Other amnesia: Secondary | ICD-10-CM

## 2015-07-08 DIAGNOSIS — G43009 Migraine without aura, not intractable, without status migrainosus: Secondary | ICD-10-CM

## 2015-07-08 DIAGNOSIS — M5416 Radiculopathy, lumbar region: Secondary | ICD-10-CM

## 2015-07-08 DIAGNOSIS — Z0289 Encounter for other administrative examinations: Secondary | ICD-10-CM

## 2015-07-08 DIAGNOSIS — Z9889 Other specified postprocedural states: Secondary | ICD-10-CM

## 2015-07-08 DIAGNOSIS — R569 Unspecified convulsions: Secondary | ICD-10-CM

## 2015-07-08 DIAGNOSIS — Q282 Arteriovenous malformation of cerebral vessels: Secondary | ICD-10-CM

## 2015-07-08 NOTE — Procedures (Signed)
   NCS (NERVE CONDUCTION STUDY) WITH EMG (ELECTROMYOGRAPHY) REPORT   STUDY DATE: Jul 08 2015 PATIENT NAME: Peter Barnett DOB: 10-27-1960 MRN: [id number]    TECHNOLOGIST: Laretta Alstrom ELECTROMYOGRAPHER: Marcial Pacas M.D.  CLINICAL INFORMATION:  55 years old male, with history of chronic low back pain, lumbar decompression surgery, now presented with severe low back pain, radiating pain to left lower extremity.  FINDINGS: NERVE CONDUCTION STUDY: Bilateral peroneal sensory responses were normal. Left peroneal to EDB and tibial motor responses showed mildly decreased C map amplitude, with normal distal latency, conduction velocity, and F-wave latency. Right peroneal to EDB and tibial motor responses were normal. Bilateral tibial H reflexes were normal and symmetric.  NEEDLE ELECTROMYOGRAPHY: Selected needle examinations was performed at bilateral lower extremity muscles, and limited needle examination of left lumbosacral paraspinal muscles.  Left tibialis anterior, tibialis posterior, peroneal longus: Increased insertional activity, no spontaneous activity, mixture of normal some enlarged motor unit potential was mildly decreased recruitment patterns.  Needle examination of left vastus lateralis was within normal limits,  There was no spontaneous activity at left L3-L4, he was not able to tolerate more extensive needle examination.  Selected needle examination of right tibialis anterior, tibialis posterior, medial gastrocnemius: Normal insertion activity, no spontaneous activity, mostly normal, mixture with mild enlargement motor unit potential, With normal recruitment patterns.  IMPRESSION:   This is a slight abnormal study. There is electrodiagnostic evidence of mild chronic bilateral lumbosacral radiculopathy, mainly involving left L4-5 S1 myotomes. There is no evidence of active process.   INTERPRETING PHYSICIAN:   Marcial Pacas M.D. Ph.D. [facility] [address], [place], [place] [address] [phone]

## 2015-07-08 NOTE — Progress Notes (Signed)
Electrodiagnostic study today, please see separate report on the procedure section

## 2015-07-08 NOTE — Progress Notes (Signed)
No chief complaint on file.     PATIENT: Peter Barnett DOB: May 15, 1960  No chief complaint on file.    HISTORICAL  Peter Barnett is a 55 years old male, accompanied by his wife,   He had PMH of right frontal AVM, s/p right frontal craniotomy, hypertension, hyperlipidemia, COPD, gerd, CAD, s/p of stent 2009, seizure disorder, chronic back pain, kidney stone, who presents with seizure, HA and intracranial bleeding.  He suffered complex partial seizure, in 1981, was diagnosed with right frontal AV malformation, had a right frontal craniotomy by Dr. Lesly Dukes at [facility] in 1981, his seizure has been under good control, but he continued to have occasional staring spells, which was considered due to simple partial or complex partial seizure, over the years, he has tried different medications, Dilantin caused gingiva hypertrophy, Keppra cause mood disorder, Topamax complains of memory trouble,   He was also seen by Dr. Joya Salm for his lumbar stenosis, he had 2 lumbar surgery, most recent one was in 2010,  He is receiving some epidural injection for his recurrent chronic low back pain which has been beneficial   He had 10 years of education, quit high school because he never was good at school, and did not like school, works at [employer] job all his life, went on disability since 1981 after his right frontal craniotomy, he stays at home, doing household chores, patient stated he never had good memory all his life, wife noticed worsening memory over the past few years. EEG was normal at 2014  He also has frequent headaches,  He presented with recurrent seizure in December 07 2014, left arm numbness, intermittent left facial numbness, December 24 2014, left arm uncontrollable shaking, mild confusion, difficulty talking,  CAT scan of the brain in December 27 2014 reviewed with patient: Small acute hemorrhage in an area of gliotic medial right frontal lobe. Chronic postoperative and  ischemic changes. I also personally reviewed CT angiogram: Remote treatment for right frontal AVM. There has been right frontal craniotomy. There are multiple surgical clips as well as in the vascular embolic particles.no evidence of recurrent AVM or aneurysm on the CTA.   He was discharged home with Trileptal 150 mg 3 tablets 3 times a day, Depakote DR 250 mg twice a day, he complains of fatigue, dizziness with current dose of medications   I also reviewed laboratory in December 28 2014, low sodium 133, normal CBC with exception of low platelets 144  UPDATE May 9th 2017: I reviewed angiogram in November 2016: Angiographically no evidence of arteriovenous shunting to suggest residual, or recurrent arteriovenous malformation. No angiographic evidence of intracranial aneurysms or dissections. Venous outflow normal hemodynamically  He has no recurrent seizure, but there is recurrent episode of onset onset left facial left arm numbness lasting couple hours, He also complains of frequent headaches, often buildup quickly, vertex region moderate to severe pain, with associated light sensitivity, lasting more than 4 hours, movement made it worse, he has more than 15 headaches days in one month He also complains of gradual worsening memory loss, forgot the trip that he has gone to He has worsening low back pain, radiating pain to left lower extremity, he was seen by Dr. Joya Salm recently, was cleared by vascular neurosurgeon Dr.Nundkumar,   I personally reviewed CT myelogram in April 2017: Regrowth of posterior elements status post lumbar decompression worst at L4-5. Development of/worsening of central protrusion at L4-5 partially calcified. Severe spinal stenosis at the L4-5 level with  RIGHT greater the LEFT L4 and L5 nerve root impingement. Central and rightward protrusion at L1-2 and foraminal and extraforaminal protrusion at L2-3 potentially affect the nerve roots at their respective levels.   REVIEW  OF SYSTEMS: Full 14 system review of systems performed and notable only for shortness of back pain, memory loss, headaches, gait difficulty  ALLERGIES: Allergies  Allergen Reactions  . Tramadol Other (See Comments)    "Staring off spells"    HOME MEDICATIONS: Current Outpatient Prescriptions  Medication Sig Dispense Refill  . acetaminophen (TYLENOL) 500 MG tablet Take 1,000 mg by mouth every 6 (six) hours as needed for headache.    . albuterol (PROVENTIL HFA;VENTOLIN HFA) 108 (90 BASE) MCG/ACT inhaler Inhale 2 puffs into the lungs every 6 (six) hours as needed for wheezing or shortness of breath. 1 Inhaler 2  . aspirin EC 81 MG tablet Take 81 mg by mouth daily.    . butalbital-acetaminophen-caffeine (FIORICET, ESGIC) 50-325-40 MG tablet Take 1 tablet by mouth every 6 (six) hours as needed for headache. 12 tablet 5  . Cholecalciferol (PA VITAMIN D-3) 2000 UNITS CAPS Take 2,000 Units by mouth daily.     . divalproex (DEPAKOTE ER) 500 MG 24 hr tablet Take 2 tablets (1,000 mg total) by mouth at bedtime. 60 tablet 11  . docusate sodium (COLACE) 100 MG capsule Take 100 mg by mouth 2 (two) times daily.    . famotidine (PEPCID) 20 MG tablet Take 20 mg by mouth daily.    Marland Kitchen HYDROcodone-acetaminophen (NORCO) 10-325 MG per tablet Take 1 tablet by mouth every 6 (six) hours as needed (pain).     . metoprolol tartrate (LOPRESSOR) 25 MG tablet Take 0.5 tablets (12.5 mg total) by mouth 2 (two) times daily. 30 tablet 12  . nitroGLYCERIN (NITROSTAT) 0.4 MG SL tablet Place 1 tablet (0.4 mg total) under the tongue every 5 (five) minutes as needed for chest pain (CP or SOB). (Patient taking differently: Place 0.4 mg under the tongue every 5 (five) minutes as needed (chest pain or shortness of breath). ) 25 tablet 3  . oxcarbazepine (TRILEPTAL) 600 MG tablet Take 1 tablet (600 mg total) by mouth 2 (two) times daily. 180 tablet 4  . simvastatin (ZOCOR) 40 MG tablet Take 1 tablet (40 mg total) by mouth daily with  supper. 30 tablet 3   No current facility-administered medications for this visit.    PAST MEDICAL HISTORY: Past Medical History  Diagnosis Date  . HTN (hypertension)   . CAD (coronary artery disease)     Premature; stents + known distal RCA/PDA occlusion  . MI (myocardial infarction) ([place]) 2009    "just had arm pain; never any chest pain"  . Exertional dyspnea 10/07/2011  . Short-term memory loss 10/07/2011    "post brain OR & from his seizures"--worsening 2017, Dr. Krista Blue in neurology doing w/u.  . Migraine syndrome 10/07/2011     a couple per month usually, but worsening 2017 so Dr. Krista Blue adjusted med  . Pneumonia ~ 2010    "2 years in a row; May both times"  . 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 Chronic lower back pain     Still with recurrent left sided LBP with paresthesias down left leg --primarily with activities: pain pill use is avg <90 tabs per mo  . Urethral stricture since 1981    Recurrent dilation has been required [facility]rology)  . History of adenomatous polyp of colon 02/2009;02/2014    Recall 5 yrs ([facility])  . COPD (chronic obstructive pulmonary disease) ([place])     NOTED on CT chest 11/2013 done for lung ca screening.  Hosp for acute exac 11/2014.  Spirometry not supportive of this dx as of pulm eval 2017, though.  Spireva trial by Dr. Elsworth Soho 02/2015.  Marland Kitchen Hyperlipidemia   . Nephrolithiasis 03/2014    Bilateral (64mm) nonobstructing  . Tobacco abuse   . Intracranial hemorrhage ([place]) 12/27/14    small acute hemorrhage in gliotic medial R frontal lobe  . OSA (obstructive sleep apnea)     Dr. Elsworth Soho feels like he has OSA but pt declines testing as of 02/2015    PAST SURGICAL HISTORY: Past Surgical History  Procedure Laterality Date  . Craniotomy for avm  1981    Dx'd after pt began having seizures.  . Tonsillectomy      "I was little"  . Lumbar disc surgery  2008; 2009    Dr. Joya Salm  . Coronary angioplasty with stent placement  2009    3 DES  to LCx; still with known distal RCA/PDA occlusion.  EF 60% by LV-gram.  . Colonoscopy w/ polypectomy  02/2009;02/2014    Recall 5 yr (aden poly, hyperplast polyp, int and ext hemorr).  Next recall is 02/2019.  . Cardiac catheterization  2011    [facility], records unavailable: pt reports "normal".  . Transthoracic echocardiogram  12/07/14    Normal LV size/fxn, EF 60%, normal valves.  . Ct angiogram (cerebral)  12/31/14    NORMAL--plan per neuro is to repeat this in 3 mo    FAMILY HISTORY: Family History  Problem Relation Age of Onset  . Hyperlipidemia Mother   . Cancer Father     Mesothelioma  . Hypertension Father   . Stroke Neg Hx   . Heart attack Paternal Uncle     In 4s too  . Asthma Father     SOCIAL HISTORY:  Social History   Social History  . Marital Status: Married    Spouse Name: [name]  . Number of Children: 2  . Years of Education: 10 th   Occupational History  . retired      retired   Social History Main Topics  . Smoking status: Former Smoker -- 3.00 packs/day for 27 years    Types: Cigarettes    Quit date: 02/08/2001  . Smokeless tobacco: Never Used  . Alcohol Use: 0.6 oz/week    1 Cans of beer per week     Comment: 3-4 times a week  . Drug Use: No  . Sexual Activity: Yes   Other Topics Concern  . Not on file   Social History Narrative   Patient lives at home with his wife [name]).   Two adult children.   Retired from [employer] approx age 5 due to medical reasons (disabled due to memory issues, seizure d/o and chronic LBP).   Education 10th grade.   Right handed.   Caffeine two cups of tea and two cups of coffee.   Tobacco 45 pack-yr hx, quit approx age 89.  Alcohol: 1-2 beers per night.     No hx of alc or drug problems.   No exercise.   Diet: regular     PHYSICAL EXAM   There were no vitals filed for this visit.  Not recorded      There is no weight on file to calculate BMI.  PHYSICAL EXAMNIATION:  Gen: NAD, conversant, well  nourised, obese, well groomed  Cardiovascular: Regular rate rhythm, no peripheral edema, warm, nontender. Eyes: Conjunctivae clear without exudates or hemorrhage Neck: Supple, no carotid bruise. Pulmonary: Clear to auscultation bilaterally   NEUROLOGICAL EXAM:  MENTAL STATUS: Speech:    Speech is normal; fluent and spontaneous with normal comprehension.  Cognition:     Orientation to time, place and person     Normal recent and remote memory     Normal Attention span and concentration     Normal Language, naming, repeating,spontaneous speech     Fund of knowledge   CRANIAL NERVES: CN II: Visual fields are full to confrontation. Fundoscopic exam is normal with sharp discs and no vascular changes. Pupils are round equal and briskly reactive to light. CN III, IV, VI: extraocular movement are normal. No ptosis.End gaze horizontal nystagmus CN V: Facial sensation is intact to pinprick in all 3 divisions bilaterally. Corneal responses are intact.  CN VII: Face is symmetric with normal eye closure and smile. CN VIII: Hearing is normal to rubbing fingers CN IX, X: Palate elevates symmetrically. Phonation is normal. CN XI: Head turning and shoulder shrug are intact CN XII: Tongue is midline with normal movements and no atrophy.  MOTOR: There is no pronator drift of out-stretched arms. Muscle bulk and tone are normal. Muscle strength is normal.  REFLEXES: Reflexes are 2+ and symmetric at the biceps, triceps, knees, and ankles. Plantar responses are flexor.  SENSORY: Intact to light touch, pinprick, position sense, and vibration sense are intact in fingers and toes.  COORDINATION: Rapid alternating movements and fine finger movements are intact. There is no dysmetria on finger-to-nose and heel-knee-shin.    GAIT/STANCE: Antalgic, cautious  DIAGNOSTIC DATA (LABS, IMAGING, TESTING) - I reviewed patient records, labs, notes, testing and imaging myself where  available.   ASSESSMENT AND PLAN  Peter Barnett is a 55 y.o. male   Complex partial seizure History of right frontal AVM, status post right frontal craniectomy Recurrent right frontal area intracranial bleeding Chronic migraine headaches   Will keep Trileptal 600 mg twice a day  Increase Depakote ER 500 mg to 2 tablets every night  Botox injection as migraine prevention  Memory loss  Mini-Mental Status Examination 21/30  Laboratory evaluation to rule out treatable etiology  History of  lumbar stenosis  Worsening low back pain, radiating pain to left lower extremity  EMG nerve conduction study   Marcial Pacas, M.D. Ph.D.  [facility] [address], [place], [place] [address] Ph: [phone] Fax: [phone]  [id number] Thurston Hole, MD

## 2015-07-09 ENCOUNTER — Telehealth: Payer: Self-pay | Admitting: Neurology

## 2015-07-09 NOTE — Telephone Encounter (Signed)
Message For: OFC                  Taken 30-MAY-17 at  4:36PM by TMW ------------------------------------------------------------  Caller  Naheem Brophy               CID  [id number]   Patient  SAME                  Pt's Dr  Krista Blue           Area Code  336  Phone#  [phone] [place]                                                 Disp:Y/N  N  If Y = C/B If No Response In 36minutes  ============================================================

## 2015-07-09 NOTE — Telephone Encounter (Signed)
Please contact patient to set up AVW and let Stefannie know once they are scheduled.  °

## 2015-07-10 NOTE — Telephone Encounter (Signed)
I called patient and she states he does not wish to schedule his AWV at this time because he may be having back surgery soon.

## 2015-07-14 ENCOUNTER — Encounter: Payer: Medicare HMO | Admitting: Family Medicine

## 2015-07-17 NOTE — Telephone Encounter (Signed)
Pt results faxed on 07/17/15.

## 2015-08-04 ENCOUNTER — Encounter: Payer: Medicare HMO | Admitting: Neurology

## 2015-08-07 ENCOUNTER — Other Ambulatory Visit: Payer: Self-pay | Admitting: Family Medicine

## 2015-08-11 ENCOUNTER — Ambulatory Visit (INDEPENDENT_AMBULATORY_CARE_PROVIDER_SITE_OTHER): Payer: Medicare HMO | Admitting: Family Medicine

## 2015-08-11 ENCOUNTER — Encounter: Payer: Self-pay | Admitting: Family Medicine

## 2015-08-11 ENCOUNTER — Encounter: Payer: Medicare HMO | Admitting: Neurology

## 2015-08-11 VITALS — BP 125/82 | HR 48 | Temp 98.1°F | Resp 16 | Ht 71.0 in | Wt 248.5 lb

## 2015-08-11 DIAGNOSIS — Z125 Encounter for screening for malignant neoplasm of prostate: Secondary | ICD-10-CM | POA: Diagnosis not present

## 2015-08-11 DIAGNOSIS — Z0000 Encounter for general adult medical examination without abnormal findings: Secondary | ICD-10-CM | POA: Diagnosis not present

## 2015-08-11 DIAGNOSIS — M4806 Spinal stenosis, lumbar region: Secondary | ICD-10-CM | POA: Diagnosis not present

## 2015-08-11 DIAGNOSIS — Z79899 Other long term (current) drug therapy: Secondary | ICD-10-CM | POA: Diagnosis not present

## 2015-08-11 LAB — LIPID PANEL
CHOL/HDL RATIO: 4.1 ratio (ref <=5.0)
Cholesterol: 194 mg/dL (ref 125–200)
HDL: 47 mg/dL (ref >=40)
LDL Cholesterol: 120 mg/dL (ref <130)
TRIGLYCERIDES: 133 mg/dL (ref <150)
VLDL: 27 mg/dL (ref <30)

## 2015-08-11 NOTE — Progress Notes (Signed)
Pre visit review using our clinic review tool, if applicable. No additional management support is needed unless otherwise documented below in the visit note. 

## 2015-08-11 NOTE — Progress Notes (Signed)
Office Note 08/11/2015  CC:  Chief Complaint  Patient presents with  . Annual Exam    Pt is fasting.    HPI:  Peter Barnett is a 55 y.o. White male who is here for annual health maintenance exam. No acute complaints. Seeing Dr. Joya Salm this afternoon to discuss the possibility of getting lumbar spine surgery. He is due for eye exam.   Past Medical History  Diagnosis Date  . HTN (hypertension)   . CAD (coronary artery disease)     Premature; stents + known distal RCA/PDA occlusion  . MI (myocardial infarction) ([place]) 2009    "just had arm pain; never any chest pain"  . Exertional dyspnea 10/07/2011  . Short-term memory loss 10/07/2011    "post brain OR & from his seizures"--worsening 2017, Dr. Krista Blue in neurology doing w/u.  . Migraine syndrome 10/07/2011     a couple per month usually, but worsening 2017 so Dr. Krista Blue adjusted med  . Pneumonia ~ 2010    "2 years in a row; May both times"  . 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 Chronic lower back pain     Still with recurrent left sided LBP with paresthesias down left leg --primarily with activities: pain pill use is avg <90 tabs per mo  . Urethral stricture since 1981    Recurrent dilation has been required [facility] urology)  . History of adenomatous polyp of colon 02/2009;02/2014    Recall 5 yrs ([facility])  . COPD (chronic obstructive pulmonary disease) ([place])     NOTED on CT chest 11/2013 done for lung ca screening.  Hosp for acute exac 11/2014.  Spirometry not supportive of this dx as of pulm eval 2017, though.  Spireva trial by Dr. Elsworth Soho 02/2015.  Marland Kitchen Hyperlipidemia   . Nephrolithiasis 03/2014    Bilateral (66mm) nonobstructing  . Tobacco abuse   . Intracranial hemorrhage ([place]) 12/27/14    small acute hemorrhage in gliotic medial R frontal lobe  . OSA (obstructive sleep apnea)     Dr. Elsworth Soho feels like he has OSA but pt declines testing as of 02/2015    Past Surgical History  Procedure Laterality Date  .  Craniotomy for avm  1981    Dx'd after pt began having seizures.  . Tonsillectomy      "I was little"  . Lumbar disc surgery  2008; 2009    Dr. Joya Salm  . Coronary angioplasty with stent placement  2009    3 DES to LCx; still with known distal RCA/PDA occlusion.  EF 60% by LV-gram.  . Colonoscopy w/ polypectomy  02/2009;02/2014    Recall 5 yr (aden poly, hyperplast polyp, int and ext hemorr).  Next recall is 02/2019.  . Cardiac catheterization  2011    [place], records unavailable: pt reports "normal".  . Transthoracic echocardiogram  12/07/14    Normal LV size/fxn, EF 60%, normal valves.  . Ct angiogram (cerebral)  12/31/14    NORMAL--plan per neuro is to repeat this in 3 mo    Family History  Problem Relation Age of Onset  . Hyperlipidemia Mother   . Cancer Father     Mesothelioma  . Hypertension Father   . Stroke Neg Hx   . Heart attack Paternal Uncle     In 59s too  . Asthma Father     Social History   Social History  . Marital Status: Married    Spouse Name: [name]  . Number of Children: 2  .  Years of Education: 10 th   Occupational History  . retired      retired   Social History Main Topics  . Smoking status: Former Smoker -- 3.00 packs/day for 27 years    Types: Cigarettes    Quit date: 02/08/2001  . Smokeless tobacco: Never Used  . Alcohol Use: 0.6 oz/week    1 Cans of beer per week     Comment: 3-4 times a week  . Drug Use: No  . Sexual Activity: Yes   Other Topics Concern  . Not on file   Social History Narrative   Patient lives at home with his wife [name]).   Two adult children.   Retired from [employer] approx age 16 due to medical reasons (disabled due to memory issues, seizure d/o and chronic LBP).   Education 10th grade.   Right handed.   Caffeine two cups of tea and two cups of coffee.   Tobacco 45 pack-yr hx, quit approx age 2.  Alcohol: 1-2 beers per night.     No hx of alc or drug problems.   No exercise.   Diet: regular    Outpatient  Prescriptions Prior to Visit  Medication Sig Dispense Refill  . acetaminophen (TYLENOL) 500 MG tablet Take 1,000 mg by mouth every 6 (six) hours as needed for headache.    . albuterol (PROVENTIL HFA;VENTOLIN HFA) 108 (90 BASE) MCG/ACT inhaler Inhale 2 puffs into the lungs every 6 (six) hours as needed for wheezing or shortness of breath. 1 Inhaler 2  . aspirin EC 81 MG tablet Take 81 mg by mouth daily.    . butalbital-acetaminophen-caffeine (FIORICET, ESGIC) 50-325-40 MG tablet Take 1 tablet by mouth every 6 (six) hours as needed for headache. 12 tablet 5  . Cholecalciferol (PA VITAMIN D-3) 2000 UNITS CAPS Take 2,000 Units by mouth daily.     . divalproex (DEPAKOTE ER) 500 MG 24 hr tablet Take 2 tablets (1,000 mg total) by mouth at bedtime. 60 tablet 11  . docusate sodium (COLACE) 100 MG capsule Take 100 mg by mouth 2 (two) times daily.    . famotidine (PEPCID) 20 MG tablet Take 20 mg by mouth daily.    Marland Kitchen HYDROcodone-acetaminophen (NORCO) 10-325 MG per tablet Take 1 tablet by mouth every 6 (six) hours as needed (pain).     . metoprolol tartrate (LOPRESSOR) 25 MG tablet Take 0.5 tablets (12.5 mg total) by mouth 2 (two) times daily. 30 tablet 12  . nitroGLYCERIN (NITROSTAT) 0.4 MG SL tablet Place 1 tablet (0.4 mg total) under the tongue every 5 (five) minutes as needed for chest pain (CP or SOB). (Patient taking differently: Place 0.4 mg under the tongue every 5 (five) minutes as needed (chest pain or shortness of breath). ) 25 tablet 3  . oxcarbazepine (TRILEPTAL) 600 MG tablet Take 1 tablet (600 mg total) by mouth 2 (two) times daily. 180 tablet 4  . simvastatin (ZOCOR) 40 MG tablet TAKE ONE TABLET BY MOUTH EVERY DAY WITH SUPPER 30 tablet 89   No facility-administered medications prior to visit.    Allergies  Allergen Reactions  . Tramadol Other (See Comments)    "Staring off spells"    ROS Review of Systems  Constitutional: Negative for fever, chills, appetite change and fatigue.  HENT:  Negative for congestion, dental problem, ear pain and sore throat.   Eyes: Negative for discharge, redness and visual disturbance.  Respiratory: Negative for cough, chest tightness, shortness of breath and wheezing.   Cardiovascular:  Negative for chest pain, palpitations and leg swelling.  Gastrointestinal: Negative for nausea, vomiting, abdominal pain, diarrhea and blood in stool.  Genitourinary: Negative for dysuria, urgency, frequency, hematuria, flank pain and difficulty urinating.  Musculoskeletal: Negative for myalgias, back pain, joint swelling, arthralgias and neck stiffness.  Skin: Negative for pallor and rash.  Neurological: Positive for headaches (top of head; chronic/recurrent). Negative for dizziness, speech difficulty and weakness.  Hematological: Negative for adenopathy. Does not bruise/bleed easily.  Psychiatric/Behavioral: Negative for confusion and sleep disturbance. The patient is not nervous/anxious.     PE; Blood pressure 125/82, pulse 48, temperature 98.1 F (36.7 C), temperature source Oral, resp. rate 16, height 5\' 11"  (1.803 m), weight 248 lb 8 oz (112.719 kg), SpO2 95 %. Gen: Alert, well appearing.  Patient is oriented to person, place, time, and situation. AFFECT: pleasant, lucid thought and speech. ENT: Ears: EACs clear, normal epithelium.  TMs with good light reflex and landmarks bilaterally.  Eyes: no injection, icteris, swelling, or exudate.  EOMI, PERRLA. Nose: no drainage or turbinate edema/swelling.  No injection or focal lesion.  Mouth: lips without lesion/swelling.  Oral mucosa pink and moist.  Dentition intact and without obvious caries or gingival swelling.  Oropharynx without erythema, exudate, or swelling.  Neck: supple/nontender.  No LAD, mass, or TM.  Carotid pulses 2+ bilaterally, without bruits. CV: RRR, no m/r/g.   LUNGS: CTA bilat, nonlabored resps, good aeration in all lung fields. ABD: soft, NT, ND, BS normal.  No hepatospenomegaly or mass.  No  bruits. EXT: no clubbing, cyanosis, or edema.  Trace to 1+ pitting edema in both LL's.  DP and PT pulses palpable bilat. Musculoskeletal: no joint swelling, erythema, warmth, or tenderness.  ROM of all joints intact. Skin - no sores or suspicious lesions or rashes or color changes Rectal exam: negative without mass, lesions or tenderness, PROSTATE EXAM: smooth and symmetric without nodules or tenderness.   Pertinent labs:  Lab Results  Component Value Date   TSH 1.730 06/17/2015   Lab Results  Component Value Date   WBC 8.6 06/17/2015   HGB 13.8 12/28/2014   HCT 46.6 06/17/2015   MCV 91 06/17/2015   PLT 178 06/17/2015   Lab Results  Component Value Date   CREATININE 0.89 06/17/2015   BUN 10 06/17/2015   NA 142 06/17/2015   K 4.7 06/17/2015   CL 102 06/17/2015   CO2 16* 06/17/2015   Lab Results  Component Value Date   ALT 22 06/17/2015   AST 16 06/17/2015   ALKPHOS 93 06/17/2015   BILITOT 0.4 06/17/2015   Lab Results  Component Value Date   CHOL 158 10/31/2013   Lab Results  Component Value Date   HDL 37.80* 10/31/2013   Lab Results  Component Value Date   LDLCALC 95 10/31/2013   Lab Results  Component Value Date   TRIG 128.0 10/31/2013   Lab Results  Component Value Date   CHOLHDL 4 10/31/2013   Lab Results  Component Value Date   PSA 1.23 10/31/2013   Lab Results  Component Value Date   HGBA1C 5.8 10/31/2013    ASSESSMENT AND PLAN:   Health maintenance exam:  Reviewed age and gender appropriate health maintenance issues (prudent diet, regular exercise, health risks of tobacco and excessive alcohol, use of seatbelts, fire alarms in home, use of sunscreen).  Also reviewed age and gender appropriate health screening as well as vaccine recommendations. No vaccines due. Recent CBC, CMET, and TSH wnl.  Will not  repeat these today, but will check FLP. Prostate ca screening: DRE normal today, PSA drawn. Colon ca screening: hx of adenomatous polyps,  next TCS recommended after 02/2019 per [facility].  He has chronic LBP with left lumbar radiculopathy, which he sees his neurosurgeon (Dr. Joya Salm) for this afternoon to discuss surgical options. Seizure d/o seems under better control since neurologist has titrated up some of his seizure meds. He states that his repeat CT angiogram of head was repeated earlier this spring and was "fine".  He was restarted on 81mg  ASA after that time.  An After Visit Summary was printed and given to the patient.  FOLLOW UP:  Return in about 6 months (around 02/11/2016) for routine chronic illness f/u.  Signed:  Crissie Sickles, MD           08/11/2015

## 2015-08-12 LAB — PSA, MEDICARE: PSA: 0.98 ng/mL (ref <=4.00)

## 2015-08-18 ENCOUNTER — Ambulatory Visit: Payer: Medicare HMO | Admitting: Nurse Practitioner

## 2015-08-20 ENCOUNTER — Other Ambulatory Visit: Payer: Self-pay | Admitting: Family Medicine

## 2015-08-20 DIAGNOSIS — H5203 Hypermetropia, bilateral: Secondary | ICD-10-CM | POA: Diagnosis not present

## 2015-08-20 DIAGNOSIS — H52229 Regular astigmatism, unspecified eye: Secondary | ICD-10-CM | POA: Diagnosis not present

## 2015-08-20 DIAGNOSIS — Z0101 Encounter for examination of eyes and vision with abnormal findings: Secondary | ICD-10-CM | POA: Diagnosis not present

## 2015-08-20 DIAGNOSIS — H2510 Age-related nuclear cataract, unspecified eye: Secondary | ICD-10-CM | POA: Diagnosis not present

## 2015-08-20 DIAGNOSIS — H5213 Myopia, bilateral: Secondary | ICD-10-CM | POA: Diagnosis not present

## 2015-08-20 DIAGNOSIS — Z0100 Encounter for examination of eyes and vision without abnormal findings: Secondary | ICD-10-CM | POA: Diagnosis not present

## 2015-08-20 DIAGNOSIS — H524 Presbyopia: Secondary | ICD-10-CM | POA: Diagnosis not present

## 2015-08-21 ENCOUNTER — Other Ambulatory Visit: Payer: Self-pay | Admitting: Family Medicine

## 2015-08-21 MED ORDER — ATORVASTATIN CALCIUM 40 MG PO TABS: 40.0000 mg | ORAL_TABLET | Freq: Every day | ORAL | Status: DC

## 2015-08-22 ENCOUNTER — Other Ambulatory Visit: Payer: Self-pay | Admitting: *Deleted

## 2015-08-22 DIAGNOSIS — I25119 Atherosclerotic heart disease of native coronary artery with unspecified angina pectoris: Secondary | ICD-10-CM

## 2015-09-02 ENCOUNTER — Encounter: Payer: Self-pay | Admitting: Podiatry

## 2015-09-02 ENCOUNTER — Ambulatory Visit (INDEPENDENT_AMBULATORY_CARE_PROVIDER_SITE_OTHER): Payer: Medicare HMO | Admitting: Podiatry

## 2015-09-02 VITALS — BP 131/80 | HR 53 | Resp 16 | Ht 71.0 in | Wt 245.0 lb

## 2015-09-02 DIAGNOSIS — L600 Ingrowing nail: Secondary | ICD-10-CM | POA: Diagnosis not present

## 2015-09-02 MED ORDER — NEOMYCIN-POLYMYXIN-HC 3.5-10000-1 OT SOLN: OTIC | 0 refills | Status: DC

## 2015-09-02 NOTE — Patient Instructions (Signed)

## 2015-09-02 NOTE — Progress Notes (Signed)
   Subjective:    Patient ID: Peter Barnett, male    DOB: 09-09-1960, 55 y.o.   MRN: [id number]  HPI: He presents today with a chief complaint of a painful ingrown toenail medial border of the second digit of the left foot which has been present for about 2 months. He states that he has had pus from beneath the nail. It is considerably tender.    Review of Systems  Cardiovascular: Positive for leg swelling.  Neurological: Positive for seizures and headaches.  All other systems reviewed and are negative.      Objective:   Physical Exam: Vital signs are stable alert and oriented 3 pulses are palpable. Neurologic sensorium is intact. Degenerative flexor intact. Muscle strength +5 over 5 dorsiflexors inverters and plantar flexors and everters bilaterally. All of his musculature is intact. Orthopedic evaluation of his result joints distal to the ankle for range of motion without crepitation. Cutaneous evaluation demonstrates supple well-hydrated cutis no erythema edema saline as drainage or odor. Sharp and rated nail margin with erythema along the tibial border of the second digit left foot.        Assessment & Plan:  Ingrown toenail paronychia abscess tibial border second digit right  Plan: Chemical matrixectomy was performed today after local anesthesia was administered. He tolerated this procedure well. He was given both oral and written home-going instructions for care and soaking of his toe as well as a prescription for Cortisporin Otic which will be applied twice daily after soaking. I will follow-up with him in 1 week for reevaluation.

## 2015-09-05 ENCOUNTER — Ambulatory Visit
Admission: RE | Admit: 2015-09-05 | Discharge: 2015-09-05 | Disposition: A | Discharge status: Home / Self Care | Payer: Medicare HMO | Source: Ambulatory Visit | Attending: Neurosurgery | Admitting: Neurosurgery

## 2015-09-05 ENCOUNTER — Other Ambulatory Visit: Payer: Self-pay | Admitting: Neurosurgery

## 2015-09-05 DIAGNOSIS — M549 Dorsalgia, unspecified: Secondary | ICD-10-CM

## 2015-09-05 DIAGNOSIS — M5126 Other intervertebral disc displacement, lumbar region: Secondary | ICD-10-CM | POA: Diagnosis not present

## 2015-09-09 ENCOUNTER — Encounter: Payer: Self-pay | Admitting: Podiatry

## 2015-09-09 ENCOUNTER — Ambulatory Visit (INDEPENDENT_AMBULATORY_CARE_PROVIDER_SITE_OTHER): Payer: Medicare HMO | Admitting: Podiatry

## 2015-09-09 DIAGNOSIS — L600 Ingrowing nail: Secondary | ICD-10-CM

## 2015-09-09 NOTE — Patient Instructions (Signed)

## 2015-09-10 NOTE — Progress Notes (Signed)
He presents today for a nail check second digit right foot fibular border.  Objective: Vital signs are stable alert and oriented 3. No erythema edema cellulitis drainage or odor. The toe appears to be healing well. I see no signs of infection.  Assessment: Well-healing surgical toe second.  Plan: Discontinue Betadine sterile with Epsom salts and warm water soaks covered during the daytime and leave open at bedtime. Continue soaks until there is no longer any redness or drainage or pain. All up with me if this should worsen.

## 2015-09-11 ENCOUNTER — Other Ambulatory Visit: Payer: Self-pay | Admitting: Neurosurgery

## 2015-09-11 DIAGNOSIS — M48061 Spinal stenosis, lumbar region without neurogenic claudication: Secondary | ICD-10-CM

## 2015-09-17 ENCOUNTER — Ambulatory Visit
Admission: RE | Admit: 2015-09-17 | Discharge: 2015-09-17 | Disposition: A | Discharge status: Home / Self Care | Payer: Medicare HMO | Source: Ambulatory Visit | Attending: Neurosurgery | Admitting: Neurosurgery

## 2015-09-17 VITALS — BP 126/79 | HR 55

## 2015-09-17 DIAGNOSIS — M5416 Radiculopathy, lumbar region: Secondary | ICD-10-CM

## 2015-09-17 DIAGNOSIS — M48061 Spinal stenosis, lumbar region without neurogenic claudication: Secondary | ICD-10-CM

## 2015-09-17 DIAGNOSIS — M4806 Spinal stenosis, lumbar region: Secondary | ICD-10-CM | POA: Diagnosis not present

## 2015-09-17 MED ORDER — ONDANSETRON HCL 4 MG/2ML IJ SOLN: 4.0000 mg | Freq: Four times a day (QID) | INTRAMUSCULAR | Status: DC | PRN

## 2015-09-17 MED ORDER — ONDANSETRON HCL 4 MG/2ML IJ SOLN
4.0000 mg | Freq: Once | INTRAMUSCULAR | Status: AC
  Administered 2015-09-17: 4 mg via INTRAMUSCULAR

## 2015-09-17 MED ORDER — MEPERIDINE HCL 100 MG/ML IJ SOLN
75.0000 mg | Freq: Once | INTRAMUSCULAR | Status: AC
  Administered 2015-09-17: 75 mg via INTRAMUSCULAR

## 2015-09-17 MED ORDER — DIAZEPAM 5 MG PO TABS
10.0000 mg | ORAL_TABLET | Freq: Once | ORAL | Status: AC
  Administered 2015-09-17: 10 mg via ORAL

## 2015-09-17 MED ORDER — IOPAMIDOL (ISOVUE-M 200) INJECTION 41%
20.0000 mL | Freq: Once | INTRAMUSCULAR | Status: AC
  Administered 2015-09-17: 20 mL via INTRATHECAL

## 2015-09-17 NOTE — Discharge Instructions (Signed)

## 2015-09-25 DIAGNOSIS — Z6835 Body mass index (BMI) 35.0-35.9, adult: Secondary | ICD-10-CM | POA: Diagnosis not present

## 2015-09-25 DIAGNOSIS — M4806 Spinal stenosis, lumbar region: Secondary | ICD-10-CM | POA: Diagnosis not present

## 2015-10-03 ENCOUNTER — Other Ambulatory Visit: Payer: Self-pay | Admitting: Neurosurgery

## 2015-10-29 DIAGNOSIS — Z23 Encounter for immunization: Secondary | ICD-10-CM | POA: Diagnosis not present

## 2015-11-04 ENCOUNTER — Other Ambulatory Visit (HOSPITAL_COMMUNITY): Payer: Medicare HMO

## 2015-11-17 ENCOUNTER — Other Ambulatory Visit (HOSPITAL_COMMUNITY): Payer: Medicare HMO

## 2015-11-17 NOTE — Pre-Procedure Instructions (Addendum)
Jaqwan Micucci Salvino  11/17/2015      [facility] - [place], [place] - [address] [place] [address] Phone: [phone] Fax: [phone]    Your procedure is scheduled on Tuesday, October 17.  Report to [facility] Admitting at 8:30 AM                 Your surgery or procedure is scheduled for 10:30 AM    Call this number if you have problems the morning of surgery: [phone]                 For any other questions, please call [phone], Monday - Friday 8 AM - 4 PM.   Remember:  Do not eat food or drink liquids after midnight Monday, October 16.  Take these medicines the morning of surgery with A SIP OF WATER: , famotidine (PEPCID), metoprolol tartrate (LOPRESSOR), oxcarbazepine (TRILEPTAL).  May use inhaler and bring it to the hospital with you.             May take HYDROcodone-acetaminophen [facility]) if needed.               1 Week prior to surgery STOP taking Aspirin , Aspirin Products (Goody Powde r, Excedrin Migraine), Ibuprofen (Advil), Naproxen (Aleve), Vitamins and Herbal Products (ie Fish Oil)                [place]- Preparing For Surgery  Before surgery, you can play an important role. Because skin is not sterile, your skin needs to be as free of germs as possible. You can reduce the number of germs on your skin by washing with CHG (chlorahexidine gluconate) Soap before surgery.  CHG is an antiseptic cleaner which kills germs and bonds with the skin to continue killing germs even after washing.  Please do not use if you have an allergy to CHG or antibacterial soaps. If your skin becomes reddened/irritated stop using the CHG.  Do not shave (including legs and underarms) for at least 48 hours prior to first CHG shower. It is OK to shave your face.  Please follow these instructions carefully.   1. Shower the NIGHT BEFORE SURGERY and the MORNING OF SURGERY with CHG.   2. If you chose to wash your hair, wash your hair  first as usual with your normal shampoo.  3. After you shampoo, rinse your hair and body thoroughly to remove the shampoo.  4. Use CHG as you would any other liquid soap. You can apply CHG directly to the skin and wash gently with a scrungie or a clean washcloth.   5. Apply the CHG Soap to your body ONLY FROM THE NECK DOWN.  Do not use on open wounds or open sores. Avoid contact with your eyes, ears, mouth and genitals (private parts). Wash genitals (private parts) with your normal soap.  6. Wash thoroughly, paying special attention to the area where your surgery will be performed.  7. Thoroughly rinse your body with warm water from the neck down.  8. DO NOT shower/wash with your normal soap after using and rinsing off the CHG Soap.  9. Pat yourself dry with a CLEAN TOWEL.   10. Wear CLEAN PAJAMAS   11. Place CLEAN SHEETS on your bed the night of your first shower and DO NOT SLEEP WITH PETS. Day of Surgery: Do not apply any deodorants/lotions. Please wear clean clothes to the hospital/surgery center.  Do not wear jewelry, make-up or nail polish.  Do not wear lotions, powders, or perfumes, or deodprant.   Men may shave face and neck.  Do not bring valuables to the hospital.  [facility] is not responsible for any belongings or valuables.  Contacts, dentures or bridgework may not be worn into surgery.  Leave your suitcase in the car.  After surgery it may be brought to your room.  For patients admitted to the hospital, discharge time will be determined by your treatment team.  Patients discharged the day of surgery will not be allowed to drive home.   Special instructions:  -  Please read over the following fact sheets that you were given: Patient Instructions for Mupirocin Application

## 2015-11-18 ENCOUNTER — Encounter (HOSPITAL_COMMUNITY): Payer: Self-pay

## 2015-11-18 ENCOUNTER — Encounter (HOSPITAL_COMMUNITY)
Admission: RE | Admit: 2015-11-18 | Discharge: 2015-11-18 | Disposition: A | Discharge status: Home / Self Care | Payer: Medicare HMO | Source: Ambulatory Visit | Attending: Neurosurgery | Admitting: Neurosurgery

## 2015-11-18 DIAGNOSIS — Z01818 Encounter for other preprocedural examination: Secondary | ICD-10-CM | POA: Insufficient documentation

## 2015-11-18 HISTORY — DX: Gastro-esophageal reflux disease without esophagitis: K21.9

## 2015-11-18 HISTORY — DX: Personal history of urinary calculi: Z87.442

## 2015-11-18 LAB — CBC
HEMATOCRIT: 44.2 % (ref 39.0–52.0)
Hemoglobin: 15.4 g/dL (ref 13.0–17.0)
MCH: 30.9 pg (ref 26.0–34.0)
MCHC: 34.8 g/dL (ref 30.0–36.0)
MCV: 88.6 fL (ref 78.0–100.0)
PLATELETS: 169 10*3/uL (ref 150–400)
RBC: 4.99 MIL/uL (ref 4.22–5.81)
RDW: 12.7 % (ref 11.5–15.5)
WBC: 6.7 10*3/uL (ref 4.0–10.5)

## 2015-11-18 LAB — BASIC METABOLIC PANEL
Anion gap: 7 (ref 5–15)
BUN: 11 mg/dL (ref 6–20)
CALCIUM: 9.1 mg/dL (ref 8.9–10.3)
CO2: 20 mmol/L — AB (ref 22–32)
CREATININE: 0.76 mg/dL (ref 0.61–1.24)
Chloride: 105 mmol/L (ref 101–111)
GFR calc non Af Amer: >60 mL/min (ref >60)
GLUCOSE: 105 mg/dL — AB (ref 65–99)
Potassium: 4.3 mmol/L (ref 3.5–5.1)
Sodium: 132 mmol/L — ABNORMAL LOW (ref 135–145)

## 2015-11-18 LAB — SURGICAL PCR SCREEN
MRSA, PCR: NEGATIVE
STAPHYLOCOCCUS AUREUS: NEGATIVE

## 2015-11-18 NOTE — Progress Notes (Signed)
   11/18/15 0902  OBSTRUCTIVE SLEEP APNEA  Have you ever been diagnosed with sleep apnea through a sleep study? No  Do you snore loudly (loud enough to be heard through closed doors)?  0  Do you often feel tired, fatigued, or sleepy during the daytime (such as falling asleep during driving or talking to someone)? 0  Has anyone observed you stop breathing during your sleep? 0  Do you have, or are you being treated for high blood pressure? 1  BMI more than 35 kg/m2? 1  Age > 50 (1-yes) 1  Neck circumference greater than:Male 16 inches or larger, Male 17inches or larger? 1 (22)  Male Gender (Yes=1) 1  Obstructive Sleep Apnea Score 5  Score 5 or greater  Results sent to PCP

## 2015-11-19 NOTE — Progress Notes (Addendum)
Anesthesia chart review: Patient is a 55 year old male scheduled for L3-4, L4-5 laminectomy/foraminotomy, possible lumbar drain on 11/25/15 by Dr. Joya Salm.   History includes former smoker, CAD/MI '09 (LCX stent; RCA occlusion with collateral flow), dysrhythmia ("flutters"), hypertension, petite mal epilepsy, hemorrhagic stroke 12/2014, right frontal craniotomy (s/p surgical/embolic treatment of right frontal AVM) '81, SOB, COPD, urethral stricture s/p dilation ([facility]), hyperlipidemia, suspected OSA (refused testing), GERD, migraines, memory loss. OSA screening score was 5.   He was admitted for COPD exacerbation 12/07/15-1031/16. He he also reported left arm and neck tingling, so there was some initial concern about angina equivalent (as he presented with arm pain and not chest pain when he had his MI in '09). Troponins were negative X 3. He saw Dr. Anitra Lauth for hospital follow-up on 12/11/14 and felt paresthesias should be evaluated by neurology. Shortly thereafter patient presented to the ED on 12/24/14 with seizure-like activity (different from prior seizures), he declined a CT scan and opted to follow-up with neurology on 12/25/14. Due to new seizure with left arm and facial numbness/shaking a head CT was ordered (MRI not an option due to cranial plate) which was done on 12/27/14 and showed a small acute hemorrhage in an area of gliotic medial right frontal lobe and he was referred back to the ED and admitted through 12/28/14. Neurosurgeon Dr. Kristeen Mans. CTA head showed no evidence of recurrent AVM or aneurysm. Anti-seizure meds adjusted, and ASA held until further instructed by his PCP. He eventually did have a cerebral angiogram by IR that showed no evidence of residual or recurrent AVM or aneurysms, venous drainage WNL. He is back on ASA now. By May neurology notes, he has been cleared by vascular neurosurgeon Dr. Kathyrn Sheriff. At that visit, he reported no recurrent seizure but recurrent left facial  and left arm numbness. He was also having frequent headaches.   - PCP is Dr. Shawnie Dapper, last visit 08/11/15 for CPE. - Cardiologist is Dr. Irish Lack, last visit 12/05/14. Apparently, back surgery was being considered at that time, so he wrote, "If he does require back surgery, this would be a good time to do it. He is not requiring clopidogrel." One year follow-up was recommended, so he is due for follow-up in the near future. - Neurologist is Dr. Krista Blue, last note visit 07/08/15. - Pulmonologist is Dr. Elsworth Soho.  Meds include albuterol, ASA 81 mg, Lipitor, Depakote ER, Pepcid, Norco, Lopressor, Nitro, Trileptal, Zocor.  BP 140/65   Pulse (!) 58   Temp 36.5 C   Resp 20   Ht 5\' 10"  (1.778 m)   Wt 253 lb 11.2 oz (115.1 kg)   SpO2 98%   BMI 36.40 kg/m   12/27/14 EKG: SR, borderline LAD, abnormal R-wave progression, early transition. Minimal ST elevation, diffuse leads. Overall, tracing appears similar to 03/20/14 EKG.  12/08/14 Echo: Study Conclusions - Left ventricle: The cavity size was normal. Systolic function was   normal. The estimated ejection fraction was in the range of 55%   to 60%. Wall motion was normal; there were no regional wall   motion abnormalities. - Aortic valve: Trileaflet; normal thickness, mildly calcified   leaflets. There was trivial regurgitation.  10/12/11 Nuclear stress test: Impression: This was essentially a low risk scan. Normal myocardial perfusion scan demonstrating an attenuation artifact in the inferior region of the myocardium. No ischemia or infarct/scar is seen in the remaining myocardium. Post-rest EF 61%.  03/28/09 Cardiac cath (Fractional flow reserve of LAD and CX) ([facility]; Care Everywhere): RESULTS:  1. Fractional flow reserve determination of the left anterior descending artery, 0.9. 2.Fracture flow reserve determination of the circumflex coronary artery, 0.95. IMPRESSION: Normal fractional flow reserve in both major epicardial  vessels.  09/18/07 Cardiac cath (in the setting of acute inferolateral MI; Dr. Peter Martinique):  ANGIOGRAPHIC DATA:   -The left coronary arises and distributes normally.  The left main coronary is short without significant disease. - The left anterior descending artery has a 40-50% stenosis in the  proximal vessel.  The remainder of the vessel is without significant  disease. - The left circumflex coronary artery has a 99% obstruction in the  midvessel after the takeoff of the first obtuse marginal vessel.  There  is thrombus in this vessel with TIMI grade 1 flow distally.  The first  obtuse marginal vessel has 40% narrowing proximally. - The right coronary is a smaller caliber vessel, has a 40% lesion  proximally.  The distal vessel has a long 99% stenosis.  The distal  right coronary artery fills by left to right collaterals. - Based on initial angiographic data, it appears that the left circumflex  coronary artery was the culprit lesion.  The distal right coronary  obstruction appears chronic and as well collateralized.  We proceeded  with acute intervention of the left circumflex coronary artery. FINAL INTERPRETATION:  1. Two-vessel obstructive atherosclerotic coronary artery disease.  2. Successful intracoronary stenting of the mid left circumflex      coronary artery.  3. Normal left ventricular function.  12/20/14 CT Chest (lung cancer screening): IMPRESSION: Scattered pulmonary nodules measuring up to 4.5 mm, unchanged. Lung-RADS Category 2, benign appearance or behavior. Continue annual screening with low-dose chest CT without contrast in 12 months.  02/20/15 Spirometry: FVC 3.06 (63%), FEV1 2.40 (63%). Moderate restriction.  Preoperative labs noted.  Discussed above with anesthesiologist Dr. Linna Caprice. Recommend cardiology preoperative evaluation. I have left a voice message for Janett Billow at Dr. Harley Hallmark office.  George Hugh [facility] Short Stay  Center/Anesthesiology Phone [phone] 11/19/2015 4:29 PM  Addendum: Patient was seen by Richardson Dopp, PA-C for cardiology pre-operative evaluation on 11/21/15. He wrote, "Surgical clearance - He has a hx of CAD s/p MI in 2009 and PCI with DES to LCx and DES to LAD and BMS to RCA.  Last myoview in 2013 was low risk.  He is set up for lumbar spine surgery next week.  He is able to achieve 4 METs or greater without chest pain.  His L arm symptoms are atypical. I reviewed his case with Dr. Irish Lack.  The patient has a lot of disease with multiple stents and would be mod risk for CV complications during any non-cardiac surgery regardless of further testing.  At this point, he has no unstable cardiac conditions and he does not require further ischemic testing. He is able to proceed with his planned procedure next week."  George Hugh [facility] Short Stay Center/Anesthesiology Phone [phone] 11/24/2015 9:15 AM

## 2015-11-21 ENCOUNTER — Encounter: Payer: Self-pay | Admitting: Physician Assistant

## 2015-11-21 ENCOUNTER — Ambulatory Visit (INDEPENDENT_AMBULATORY_CARE_PROVIDER_SITE_OTHER): Payer: Medicare HMO | Admitting: Physician Assistant

## 2015-11-21 VITALS — BP 132/90 | HR 54 | Ht 70.0 in | Wt 254.4 lb

## 2015-11-21 DIAGNOSIS — I2510 Atherosclerotic heart disease of native coronary artery without angina pectoris: Secondary | ICD-10-CM

## 2015-11-21 DIAGNOSIS — Z01810 Encounter for preprocedural cardiovascular examination: Secondary | ICD-10-CM | POA: Diagnosis not present

## 2015-11-21 DIAGNOSIS — E7800 Pure hypercholesterolemia, unspecified: Secondary | ICD-10-CM

## 2015-11-21 DIAGNOSIS — I10 Essential (primary) hypertension: Secondary | ICD-10-CM | POA: Diagnosis not present

## 2015-11-21 NOTE — Progress Notes (Signed)
Cardiology Office Note:    Date:  11/21/2015   ID:  Peter Barnett, DOB 22-Sep-1960, MRN [id number]  PCP:  Tammi Sou, MD  Cardiologist:  Dr. Casandra Doffing   Electrophysiologist:  n/a  Referring MD: Tammi Sou, MD   Chief Complaint  Patient presents with  . Surgical Clearance    History of Present Illness:    Peter Barnett is a 55 y.o. male with a hx of CAD status post prior MI and DES to the LCx in 8/09 and staged PCI of LAD with DES and distal RCA with BMS.  LHC in 2011 at [place] with FFR of LAD and LCx without hemodynamically significant lesions.  Other hx includes right frontal AVM status post right frontal craniotomy and recurrent bleed in 11/16, HTN, HL, COPD, GERD, seizure disorder. Myoview in 2013 was low risk.   Last seen by Dr. Casandra Doffing in 10/16.  He has lumbar stenosis and needs surgery with Dr. Joya Salm next Tues.  He returns for surgical clearance.  He is here with his wife today.  He has been s/w limited by his back but continues to go to the [facility]. He walks on the treadmill, elliptical and rides the stationary bike. He denies chest pain, shortness of breath, syncope, orthopnea, PND, edema.  His LDL was 120 in July and his PCP changed his Simva to Atorva.  Since then, he has noted L arm pain off and on.  He tells me that his angina with his MI was also L arm pain but this is different.  He has some L arm symptoms with positional changes.  He can experience it with some activity but also notes that he does not feel it while working out at [employer].    Prior CV studies that were reviewed today include:    Echo 10/16 EF 55-60, normal wall motion, trivial AI  Myoview 9/13 EF 61%, low risk, no ischemia or scar  [facility]R at [facility] in 2/11 FFR of LAD 0.9 (ok) FFR of LCx 0.95 (ok)  LHC 8/09 LM okay LAD 40-50 proximal LCx mid 99 after OM1; proximal OM 40 RCA proximal 40, distal 99-chronic PCI: Promus DES to the mid LCx  Staged PCI 10/02/07:  Promus DES to  pLAD (IVUS guided) and BMS to RCA  Past Medical History:  Diagnosis Date  . CAD (coronary artery disease)    Premature; stents + known distal RCA/PDA occlusion  . Chronic lower back pain    Still with recurrent left sided LBP with paresthesias down left leg --primarily with activities: pain pill use is avg <90 tabs per mo  . COPD (chronic obstructive pulmonary disease) ([place])    NOTED on CT chest 11/2013 done for lung ca screening.  Hosp for acute exac 11/2014.  Spirometry not supportive of this dx as of pulm eval 2017, though.  Spireva trial by Dr. Elsworth Soho 02/2015.  Marland Kitchen Dyspnea    sitting and exersion  . Dysrhythmia    "flutters"  . Emphysema of lung ([place])   . Exertional dyspnea 10/07/2011  . GERD (gastroesophageal reflux disease)   . History of adenomatous polyp of colon 02/2009;02/2014   Recall 5 yrs ([facility])  . History of kidney stones   . HTN (hypertension)   . Hyperlipidemia   . Intracranial hemorrhage ([place]) 12/27/14   small acute hemorrhage in gliotic medial R frontal lobe  . Memory loss   . MI (myocardial infarction) 2009   "just had arm pain; never  any chest pain"  . Migraine syndrome 10/07/2011    a couple per month usually, but worsening 2017 so Dr. Krista Blue adjusted med  . OSA (obstructive sleep apnea)    Dr. Elsworth Soho feels like he has OSA but pt declines testing as of 02/2015  . 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 Pneumonia ~ 2010   "2 years in a row; May both times"  . Seizures ([place])    last yr ago  . Short-term memory loss 10/07/2011   "post brain OR & from his seizures"--worsening 2017, Dr. Krista Blue in neurology doing w/u.  Marland Kitchen Stroke ([place]) 12/2014  . Tobacco abuse   . Urethral stricture since 1981   Recurrent dilation has been required [facility] urology)    Past Surgical History:  Procedure Laterality Date  . CARDIAC CATHETERIZATION  2011   [facility], records unavailable: pt reports "normal".  . COLONOSCOPY W/ POLYPECTOMY  02/2009;02/2014   Recall 5 yr  (aden poly, hyperplast polyp, int and ext hemorr).  Next recall is 02/2019.  Marland Kitchen CORONARY ANGIOPLASTY WITH STENT PLACEMENT  2009   3 DES to LCx; still with known distal RCA/PDA occlusion.  EF 60% by LV-gram.  . CRANIOTOMY FOR AVM  1981   Dx'd after pt began having seizures.  . CT ANGIOGRAM (CEREBRAL)  12/31/14   NORMAL--plan per neuro is to repeat this in 3 mo.  Pt states it was repeated Spring 2017 and was "fine"  . [place] SURGERY  2008; 2009   Dr. Joya Salm  . TONSILLECTOMY     "I was little"  . TRANSTHORACIC ECHOCARDIOGRAM  12/07/14   Normal LV size/fxn, EF 60%, normal valves.    Current Medications: Current Meds  Medication Sig  . acetaminophen (TYLENOL) 500 MG tablet Take 1,000 mg by mouth every 6 (six) hours as needed for headache.  . albuterol (PROVENTIL HFA;VENTOLIN HFA) 108 (90 BASE) MCG/ACT inhaler Inhale 2 puffs into the lungs every 6 (six) hours as needed for wheezing or shortness of breath.  Marland Kitchen aspirin EC 81 MG tablet Take 81 mg by mouth daily.  Marland Kitchen atorvastatin (LIPITOR) 40 MG tablet Take 1 tablet (40 mg total) by mouth daily.  . butalbital-acetaminophen-caffeine (FIORICET, ESGIC) 50-325-40 MG tablet Take 1 tablet by mouth every 6 (six) hours as needed for headache.  . Cholecalciferol 1000 units tablet Take 1,000 Units by mouth daily.  . divalproex (DEPAKOTE ER) 500 MG 24 hr tablet Take 2 tablets (1,000 mg total) by mouth at bedtime.  . docusate sodium (COLACE) 100 MG capsule Take 200 mg by mouth daily.   . famotidine (PEPCID) 20 MG tablet Take 20 mg by mouth daily.  Marland Kitchen HYDROcodone-acetaminophen (NORCO) 10-325 MG per tablet Take 1 tablet by mouth every 6 (six) hours as needed (pain).   . metoprolol tartrate (LOPRESSOR) 25 MG tablet Take 0.5 tablets (12.5 mg total) by mouth 2 (two) times daily.  . nitroGLYCERIN (NITROSTAT) 0.4 MG SL tablet Place 1 tablet (0.4 mg total) under the tongue every 5 (five) minutes as needed for chest pain (CP or SOB). (Patient taking differently: Place  0.4 mg under the tongue every 5 (five) minutes as needed (chest pain or shortness of breath). )  . oxcarbazepine (TRILEPTAL) 600 MG tablet Take 1 tablet (600 mg total) by mouth 2 (two) times daily.     Allergies:   Tramadol   Social History   Social History  . Marital status: Married    Spouse name: [name]  . Number of children: 2  . Years  of education: 10 th   Occupational History  . retired  Retired    retired   Social History Main Topics  . Smoking status: Former Smoker    Packs/day: 3.00    Years: 27.00    Types: Cigarettes    Quit date: 02/08/2001  . Smokeless tobacco: Never Used  . Alcohol use 4.2 oz/week    7 Cans of beer per week  . Drug use: No  . Sexual activity: Yes   Other Topics Concern  . None   Social History Narrative   Patient lives at home with his wife [name]).   Two adult children.   Retired from [employer] approx age 70 due to medical reasons (disabled due to memory issues, seizure d/o and chronic LBP).   Education 10th grade.   Right handed.   Caffeine two cups of tea and two cups of coffee.   Tobacco 45 pack-yr hx, quit approx age 60.  Alcohol: 1-2 beers per night.     No hx of alc or drug problems.   No exercise.   Diet: regular     Family History:  The patient's family history includes Asthma in his father; Cancer in his father; Heart attack in his paternal uncle; Hyperlipidemia in his mother; Hypertension in his father.   ROS:   Please see the history of present illness.    Review of Systems  Cardiovascular: Positive for leg swelling.  Musculoskeletal: Positive for back pain, joint pain and myalgias.   All other systems reviewed and are negative.   EKGs/Labs/Other Test Reviewed:    EKG:  EKG is  ordered today.  The ekg ordered today demonstrates Sinus bradycardia, HR 54, left axis deviation, QTc 422 ms, no change from prior tracing dated 10/25/13.  Recent Labs: 12/05/2014: Brain Natriuretic Peptide 6.4 12/08/2014: Magnesium  2.1 06/17/2015: ALT 22; TSH 1.730 11/18/2015: BUN 11; Creatinine, Ser 0.76; Hemoglobin 15.4; Platelets 169; Potassium 4.3; Sodium 132   Recent Lipid Panel    Component Value Date/Time   CHOL 194 08/11/2015 1043   TRIG 133 08/11/2015 1043   HDL 47 08/11/2015 1043   CHOLHDL 4.1 08/11/2015 1043   VLDL 27 08/11/2015 1043   LDLCALC 120 08/11/2015 1043     Physical Exam:    VS:  BP 132/90   Pulse (!) 54   Ht 5\' 10"  (1.778 m)   Wt 254 lb 6.4 oz (115.4 kg)   BMI 36.50 kg/m     Wt Readings from Last 3 Encounters:  11/21/15 254 lb 6.4 oz (115.4 kg)  11/18/15 253 lb 11.2 oz (115.1 kg)  09/02/15 245 lb (111.1 kg)     Physical Exam  Constitutional: He is oriented to person, place, and time. He appears well-developed and well-nourished. No distress.  HENT:  Head: Normocephalic and atraumatic.  Eyes: No scleral icterus.  Neck: Normal range of motion. No JVD present.  Cardiovascular: Normal rate, regular rhythm, S1 normal and S2 normal.   No murmur heard. Pulmonary/Chest: Effort normal and breath sounds normal. He has no wheezes. He has no rhonchi. He has no rales.  Abdominal: Soft. There is no tenderness.  Musculoskeletal: He exhibits edema.  Trace bilat LE edema  Neurological: He is alert and oriented to person, place, and time.  Skin: Skin is warm and dry.  Psychiatric: He has a normal mood and affect.    ASSESSMENT:    1. Preoperative cardiovascular examination   2. Coronary artery disease involving native coronary artery of native heart without  angina pectoris   3. Essential hypertension   4. Pure hypercholesterolemia    PLAN:    In order of problems listed above:  1. Surgical clearance - He has a hx of CAD s/p MI in 2009 and PCI with DES to LCx and DES to LAD and BMS to RCA.  Last myoview in 2013 was low risk.  He is set up for lumbar spine surgery next week.  He is able to achieve 4 METs or greater without chest pain.  His L arm symptoms are atypical. I reviewed his  case with Dr. Irish Lack.  The patient has a lot of disease with multiple stents and would be mod risk for CV complications during any non-cardiac surgery regardless of further testing.  At this point, he has no unstable cardiac conditions and he does not require further ischemic testing. He is able to proceed with his planned procedure next week.  2. CAD - s/p prior multi-vessel PCI in 2009 in setting of MI.  Myoview in 2013 was low risk.  Echo in 2016 with normal EF.  No symptoms of recurrent angina.  Continue statin, beta blocker. Resume ASA post op as soon as safe.   3. HTN - His blood pressure is borderline elevated today.  Continue current Rx and continue to monitor.    4. HL - His LDL was high in 7/17 and he was changed to Atorva.  Question if L arm symptoms are caused by this.  He can hold Atorva for 4 weeks.  If symptoms improve off, would change to Rosuvastatin at that point.     Medication Adjustments/Labs and Tests Ordered: Current medicines are reviewed at length with the patient today.  Concerns regarding medicines are outlined above.  Medication changes, Labs and Tests ordered today are outlined in the Patient Instructions noted below. Patient Instructions  Medication Instructions:  Hold Atorvastatin (Lipitor) for 4 weeks. If you do NOT feel better, restart Lipitor at that time. If you DO feel better, call us so we may give you further instruction.  Labwork: None  Testing/Procedures: None  Follow-Up: Your physician wants you to follow-up in: 6 months with Dr. Irish Lack. You will receive a reminder letter in the mail two months in advance. If you don't receive a letter, please call our office to schedule the follow-up appointment.   Any Other Special Instructions Will Be Listed Below (If Applicable).  If you need a refill on your cardiac medications before your next appointment, please call your pharmacy.  Signed, Richardson Dopp, PA-C  11/21/2015 2:40 PM    [facility] [place], [place], [place]  [address] Phone: [phone]; Fax: [phone]

## 2015-11-21 NOTE — Patient Instructions (Addendum)
Medication Instructions:  Hold Atorvastatin (Lipitor) for 4 weeks. If you do NOT feel better, restart Lipitor at that time. If you DO feel better, call us so we may give you further instruction.  Labwork: None  Testing/Procedures: None  Follow-Up: Your physician wants you to follow-up in: 6 months with Dr. Irish Lack. You will receive a reminder letter in the mail two months in advance. If you don't receive a letter, please call our office to schedule the follow-up appointment.   Any Other Special Instructions Will Be Listed Below (If Applicable).  If you need a refill on your cardiac medications before your next appointment, please call your pharmacy.

## 2015-11-23 ENCOUNTER — Encounter: Payer: Self-pay | Admitting: Family Medicine

## 2015-11-24 ENCOUNTER — Encounter (HOSPITAL_COMMUNITY): Payer: Medicare HMO

## 2015-11-24 NOTE — Anesthesia Preprocedure Evaluation (Addendum)
Anesthesia Evaluation  Patient identified by MRN, date of birth, ID band Patient awake    Reviewed: Allergy & Precautions, H&P , NPO status , Patient's Chart, lab work & pertinent test results  Airway Mallampati: III  TM Distance: >3 FB Neck ROM: Full    Dental no notable dental hx. (+) Teeth Intact, Dental Advisory Given   Pulmonary sleep apnea , COPD,  COPD inhaler, former smoker,    Pulmonary exam normal breath sounds clear to auscultation       Cardiovascular hypertension, Pt. on medications and Pt. on home beta blockers + CAD, + Past MI, + Cardiac Stents and + Peripheral Vascular Disease  + dysrhythmias  Rhythm:Regular Rate:Normal     Neuro/Psych  Headaches, Seizures -, Well Controlled,  CVA, No Residual Symptoms negative psych ROS   GI/Hepatic Neg liver ROS, GERD  Medicated and Controlled,  Endo/Other  negative endocrine ROS  Renal/GU negative Renal ROS  negative genitourinary   Musculoskeletal   Abdominal   Peds  Hematology negative hematology ROS (+)   Anesthesia Other Findings   Reproductive/Obstetrics negative OB ROS                            Anesthesia Physical Anesthesia Plan  ASA: III  Anesthesia Plan: General   Post-op Pain Management:    Induction: Intravenous  Airway Management Planned: Oral ETT  Additional Equipment:   Intra-op Plan:   Post-operative Plan: Extubation in OR  Informed Consent: I have reviewed the patients History and Physical, chart, labs and discussed the procedure including the risks, benefits and alternatives for the proposed anesthesia with the patient or authorized representative who has indicated his/her understanding and acceptance.   Dental advisory given  Plan Discussed with: CRNA  Anesthesia Plan Comments:         Anesthesia Quick Evaluation

## 2015-11-25 ENCOUNTER — Inpatient Hospital Stay (HOSPITAL_COMMUNITY)
Admission: RE | Admit: 2015-11-25 | Discharge: 2015-11-27 | DRG: 517 | Disposition: A | Discharge status: Home / Self Care | Payer: MEDICARE | Source: Ambulatory Visit | Attending: Neurosurgery | Admitting: Neurosurgery

## 2015-11-25 ENCOUNTER — Ambulatory Visit (HOSPITAL_COMMUNITY): Payer: MEDICARE | Admitting: Vascular Surgery

## 2015-11-25 ENCOUNTER — Ambulatory Visit (HOSPITAL_COMMUNITY): Payer: MEDICARE

## 2015-11-25 ENCOUNTER — Ambulatory Visit (HOSPITAL_COMMUNITY): Payer: MEDICARE | Admitting: Anesthesiology

## 2015-11-25 ENCOUNTER — Encounter (HOSPITAL_COMMUNITY): Payer: Self-pay | Admitting: Urology

## 2015-11-25 ENCOUNTER — Encounter (HOSPITAL_COMMUNITY)
Admission: RE | Disposition: A | Discharge status: Home / Self Care | Payer: Self-pay | Source: Ambulatory Visit | Attending: Neurosurgery

## 2015-11-25 DIAGNOSIS — Z825 Family history of asthma and other chronic lower respiratory diseases: Secondary | ICD-10-CM

## 2015-11-25 DIAGNOSIS — I252 Old myocardial infarction: Secondary | ICD-10-CM | POA: Diagnosis not present

## 2015-11-25 DIAGNOSIS — M5126 Other intervertebral disc displacement, lumbar region: Secondary | ICD-10-CM | POA: Diagnosis present

## 2015-11-25 DIAGNOSIS — K219 Gastro-esophageal reflux disease without esophagitis: Secondary | ICD-10-CM | POA: Diagnosis present

## 2015-11-25 DIAGNOSIS — I10 Essential (primary) hypertension: Secondary | ICD-10-CM | POA: Diagnosis not present

## 2015-11-25 DIAGNOSIS — Z8673 Personal history of transient ischemic attack (TIA), and cerebral infarction without residual deficits: Secondary | ICD-10-CM

## 2015-11-25 DIAGNOSIS — M48061 Spinal stenosis, lumbar region without neurogenic claudication: Secondary | ICD-10-CM | POA: Diagnosis present

## 2015-11-25 DIAGNOSIS — M48062 Spinal stenosis, lumbar region with neurogenic claudication: Secondary | ICD-10-CM | POA: Diagnosis not present

## 2015-11-25 DIAGNOSIS — M545 Low back pain: Secondary | ICD-10-CM | POA: Diagnosis not present

## 2015-11-25 DIAGNOSIS — M9973 Connective tissue and disc stenosis of intervertebral foramina of lumbar region: Secondary | ICD-10-CM | POA: Diagnosis not present

## 2015-11-25 DIAGNOSIS — Z419 Encounter for procedure for purposes other than remedying health state, unspecified: Secondary | ICD-10-CM

## 2015-11-25 DIAGNOSIS — Z8249 Family history of ischemic heart disease and other diseases of the circulatory system: Secondary | ICD-10-CM

## 2015-11-25 DIAGNOSIS — M5416 Radiculopathy, lumbar region: Secondary | ICD-10-CM | POA: Diagnosis not present

## 2015-11-25 DIAGNOSIS — G4733 Obstructive sleep apnea (adult) (pediatric): Secondary | ICD-10-CM | POA: Diagnosis present

## 2015-11-25 DIAGNOSIS — Z955 Presence of coronary angioplasty implant and graft: Secondary | ICD-10-CM

## 2015-11-25 DIAGNOSIS — J449 Chronic obstructive pulmonary disease, unspecified: Secondary | ICD-10-CM | POA: Diagnosis not present

## 2015-11-25 DIAGNOSIS — E785 Hyperlipidemia, unspecified: Secondary | ICD-10-CM | POA: Diagnosis not present

## 2015-11-25 DIAGNOSIS — Z87891 Personal history of nicotine dependence: Secondary | ICD-10-CM

## 2015-11-25 DIAGNOSIS — I2510 Atherosclerotic heart disease of native coronary artery without angina pectoris: Secondary | ICD-10-CM | POA: Diagnosis not present

## 2015-11-25 DIAGNOSIS — M2578 Osteophyte, vertebrae: Secondary | ICD-10-CM | POA: Diagnosis not present

## 2015-11-25 DIAGNOSIS — Z7982 Long term (current) use of aspirin: Secondary | ICD-10-CM

## 2015-11-25 HISTORY — PX: BACK SURGERY: SHX140

## 2015-11-25 HISTORY — PX: LUMBAR LAMINECTOMY/DECOMPRESSION MICRODISCECTOMY: SHX5026

## 2015-11-25 SURGERY — LUMBAR LAMINECTOMY/DECOMPRESSION MICRODISCECTOMY 2 LEVELS
Anesthesia: General

## 2015-11-25 MED ORDER — FENTANYL CITRATE (PF) 100 MCG/2ML IJ SOLN
INTRAMUSCULAR | Status: AC
  Filled 2015-11-25: qty 4

## 2015-11-25 MED ORDER — DIVALPROEX SODIUM ER 500 MG PO TB24
1000.0000 mg | ORAL_TABLET | Freq: Every day | ORAL | Status: DC
  Administered 2015-11-25 – 2015-11-26 (×2): 1000 mg via ORAL
  Filled 2015-11-25 (×2): qty 2

## 2015-11-25 MED ORDER — SODIUM CHLORIDE 0.9% FLUSH: 3.0000 mL | INTRAVENOUS | Status: DC | PRN

## 2015-11-25 MED ORDER — ARTIFICIAL TEARS OP OINT
TOPICAL_OINTMENT | OPHTHALMIC | Status: AC
  Filled 2015-11-25: qty 3.5

## 2015-11-25 MED ORDER — PROPOFOL 10 MG/ML IV BOLUS
INTRAVENOUS | Status: AC
  Filled 2015-11-25: qty 20

## 2015-11-25 MED ORDER — HEMOSTATIC AGENTS (NO CHARGE) OPTIME
TOPICAL | Status: DC | PRN
  Administered 2015-11-25: 1 via TOPICAL

## 2015-11-25 MED ORDER — ONDANSETRON HCL 4 MG/2ML IJ SOLN: 4.0000 mg | Freq: Four times a day (QID) | INTRAMUSCULAR | Status: DC | PRN

## 2015-11-25 MED ORDER — ATORVASTATIN CALCIUM 40 MG PO TABS
40.0000 mg | ORAL_TABLET | Freq: Every day | ORAL | Status: DC
  Filled 2015-11-25 (×2): qty 1

## 2015-11-25 MED ORDER — BUPIVACAINE-EPINEPHRINE (PF) 0.5% -1:200000 IJ SOLN
INTRAMUSCULAR | Status: AC
  Filled 2015-11-25: qty 30

## 2015-11-25 MED ORDER — PHENOL 1.4 % MT LIQD: 1.0000 | OROMUCOSAL | Status: DC | PRN

## 2015-11-25 MED ORDER — MENTHOL 3 MG MT LOZG: 1.0000 | LOZENGE | OROMUCOSAL | Status: DC | PRN

## 2015-11-25 MED ORDER — SODIUM CHLORIDE 0.9% FLUSH
3.0000 mL | Freq: Two times a day (BID) | INTRAVENOUS | Status: DC
  Administered 2015-11-26: 3 mL via INTRAVENOUS

## 2015-11-25 MED ORDER — HYDROCODONE-ACETAMINOPHEN 5-325 MG PO TABS
1.0000 | ORAL_TABLET | ORAL | Status: DC | PRN
  Administered 2015-11-26 – 2015-11-27 (×7): 2 via ORAL
  Filled 2015-11-25 (×7): qty 2

## 2015-11-25 MED ORDER — CEFAZOLIN IN D5W 1 GM/50ML IV SOLN
1.0000 g | Freq: Three times a day (TID) | INTRAVENOUS | Status: AC
  Administered 2015-11-25 – 2015-11-26 (×2): 1 g via INTRAVENOUS
  Filled 2015-11-25 (×2): qty 50

## 2015-11-25 MED ORDER — PROPOFOL 10 MG/ML IV BOLUS
INTRAVENOUS | Status: DC | PRN
  Administered 2015-11-25: 200 mg via INTRAVENOUS

## 2015-11-25 MED ORDER — MIDAZOLAM HCL 2 MG/2ML IJ SOLN
INTRAMUSCULAR | Status: DC | PRN
  Administered 2015-11-25: 2 mg via INTRAVENOUS

## 2015-11-25 MED ORDER — ACETAMINOPHEN 650 MG RE SUPP: 650.0000 mg | RECTAL | Status: DC | PRN

## 2015-11-25 MED ORDER — LIDOCAINE 2% (20 MG/ML) 5 ML SYRINGE
INTRAMUSCULAR | Status: AC
  Filled 2015-11-25: qty 5

## 2015-11-25 MED ORDER — DIPHENHYDRAMINE HCL 50 MG/ML IJ SOLN: 12.5000 mg | Freq: Four times a day (QID) | INTRAMUSCULAR | Status: DC | PRN

## 2015-11-25 MED ORDER — FAMOTIDINE 20 MG PO TABS
20.0000 mg | ORAL_TABLET | Freq: Every day | ORAL | Status: DC
  Administered 2015-11-26 – 2015-11-27 (×2): 20 mg via ORAL
  Filled 2015-11-25 (×2): qty 1

## 2015-11-25 MED ORDER — EPHEDRINE SULFATE-NACL 50-0.9 MG/10ML-% IV SOSY
PREFILLED_SYRINGE | INTRAVENOUS | Status: DC | PRN
  Administered 2015-11-25: 10 mg via INTRAVENOUS

## 2015-11-25 MED ORDER — DOCUSATE SODIUM 100 MG PO CAPS
200.0000 mg | ORAL_CAPSULE | Freq: Every day | ORAL | Status: DC
  Administered 2015-11-25 – 2015-11-27 (×3): 200 mg via ORAL
  Filled 2015-11-25 (×3): qty 2

## 2015-11-25 MED ORDER — HYDROMORPHONE HCL 2 MG/ML IJ SOLN
INTRAMUSCULAR | Status: AC
  Filled 2015-11-25: qty 1

## 2015-11-25 MED ORDER — MORPHINE SULFATE 2 MG/ML IV SOLN
INTRAVENOUS | Status: DC
  Administered 2015-11-25: 16:00:00 via INTRAVENOUS
  Administered 2015-11-26: 0 mg via INTRAVENOUS
  Administered 2015-11-26: 10.5 mg via INTRAVENOUS

## 2015-11-25 MED ORDER — ONDANSETRON HCL 4 MG/2ML IJ SOLN
4.0000 mg | INTRAMUSCULAR | Status: DC | PRN
  Administered 2015-11-25: 4 mg via INTRAVENOUS
  Filled 2015-11-25: qty 2

## 2015-11-25 MED ORDER — METOPROLOL TARTRATE 12.5 MG HALF TABLET
12.5000 mg | ORAL_TABLET | Freq: Two times a day (BID) | ORAL | Status: DC
  Administered 2015-11-25 – 2015-11-27 (×4): 12.5 mg via ORAL
  Filled 2015-11-25 (×4): qty 1

## 2015-11-25 MED ORDER — ROCURONIUM BROMIDE 100 MG/10ML IV SOLN
INTRAVENOUS | Status: DC | PRN
  Administered 2015-11-25: 40 mg via INTRAVENOUS
  Administered 2015-11-25: 10 mg via INTRAVENOUS
  Administered 2015-11-25: 20 mg via INTRAVENOUS
  Administered 2015-11-25: 60 mg via INTRAVENOUS

## 2015-11-25 MED ORDER — MIDAZOLAM HCL 2 MG/2ML IJ SOLN
INTRAMUSCULAR | Status: AC
  Filled 2015-11-25: qty 2

## 2015-11-25 MED ORDER — SUCCINYLCHOLINE CHLORIDE 200 MG/10ML IV SOSY
PREFILLED_SYRINGE | INTRAVENOUS | Status: AC
  Filled 2015-11-25: qty 10

## 2015-11-25 MED ORDER — THROMBIN 5000 UNITS EX SOLR
CUTANEOUS | Status: AC
  Filled 2015-11-25: qty 15000

## 2015-11-25 MED ORDER — ONDANSETRON HCL 4 MG/2ML IJ SOLN
INTRAMUSCULAR | Status: DC | PRN
  Administered 2015-11-25: 4 mg via INTRAVENOUS

## 2015-11-25 MED ORDER — EPHEDRINE 5 MG/ML INJ
INTRAVENOUS | Status: AC
  Filled 2015-11-25: qty 10

## 2015-11-25 MED ORDER — ONDANSETRON HCL 4 MG/2ML IJ SOLN
INTRAMUSCULAR | Status: AC
  Filled 2015-11-25: qty 2

## 2015-11-25 MED ORDER — SODIUM CHLORIDE 0.9% FLUSH: 9.0000 mL | INTRAVENOUS | Status: DC | PRN

## 2015-11-25 MED ORDER — FENTANYL CITRATE (PF) 100 MCG/2ML IJ SOLN
INTRAMUSCULAR | Status: AC
  Filled 2015-11-25: qty 2

## 2015-11-25 MED ORDER — CHLORHEXIDINE GLUCONATE CLOTH 2 % EX PADS: 6.0000 | MEDICATED_PAD | Freq: Once | CUTANEOUS | Status: DC

## 2015-11-25 MED ORDER — MORPHINE SULFATE 2 MG/ML IV SOLN
INTRAVENOUS | Status: AC
  Filled 2015-11-25: qty 25

## 2015-11-25 MED ORDER — PHENYLEPHRINE HCL 10 MG/ML IJ SOLN
INTRAVENOUS | Status: DC | PRN
  Administered 2015-11-25: 25 ug/min via INTRAVENOUS

## 2015-11-25 MED ORDER — PHENOL 1.4 % MT LIQD
1.0000 | OROMUCOSAL | Status: DC | PRN
Start: 2015-11-25 — End: 2015-11-27

## 2015-11-25 MED ORDER — NITROGLYCERIN 0.4 MG SL SUBL: 0.4000 mg | SUBLINGUAL_TABLET | SUBLINGUAL | Status: DC | PRN

## 2015-11-25 MED ORDER — LACTATED RINGERS IV SOLN
INTRAVENOUS | Status: DC
  Administered 2015-11-25 (×3): via INTRAVENOUS

## 2015-11-25 MED ORDER — ASPIRIN EC 81 MG PO TBEC
81.0000 mg | DELAYED_RELEASE_TABLET | Freq: Every day | ORAL | Status: DC
  Administered 2015-11-26 – 2015-11-27 (×2): 81 mg via ORAL
  Filled 2015-11-25 (×2): qty 1

## 2015-11-25 MED ORDER — OXCARBAZEPINE 300 MG PO TABS
600.0000 mg | ORAL_TABLET | Freq: Two times a day (BID) | ORAL | Status: DC
  Administered 2015-11-25 – 2015-11-27 (×4): 600 mg via ORAL
  Filled 2015-11-25 (×4): qty 2

## 2015-11-25 MED ORDER — SUGAMMADEX SODIUM 200 MG/2ML IV SOLN
INTRAVENOUS | Status: AC
  Filled 2015-11-25: qty 2

## 2015-11-25 MED ORDER — FENTANYL CITRATE (PF) 100 MCG/2ML IJ SOLN
INTRAMUSCULAR | Status: DC | PRN
  Administered 2015-11-25: 50 ug via INTRAVENOUS
  Administered 2015-11-25: 100 ug via INTRAVENOUS
  Administered 2015-11-25: 150 ug via INTRAVENOUS

## 2015-11-25 MED ORDER — NALOXONE HCL 0.4 MG/ML IJ SOLN: 0.4000 mg | INTRAMUSCULAR | Status: DC | PRN

## 2015-11-25 MED ORDER — HYDROMORPHONE HCL 2 MG/ML IJ SOLN
0.2500 mg | INTRAMUSCULAR | Status: DC | PRN
  Administered 2015-11-25 (×4): 0.5 mg via INTRAVENOUS

## 2015-11-25 MED ORDER — ACETAMINOPHEN 325 MG PO TABS: 650.0000 mg | ORAL_TABLET | ORAL | Status: DC | PRN

## 2015-11-25 MED ORDER — SODIUM CHLORIDE 0.9 % IV SOLN: 250.0000 mL | INTRAVENOUS | Status: DC

## 2015-11-25 MED ORDER — CEFAZOLIN SODIUM-DEXTROSE 2-4 GM/100ML-% IV SOLN
2.0000 g | INTRAVENOUS | Status: AC
  Administered 2015-11-25: 2 g via INTRAVENOUS
  Filled 2015-11-25: qty 100

## 2015-11-25 MED ORDER — DIPHENHYDRAMINE HCL 12.5 MG/5ML PO ELIX: 12.5000 mg | ORAL_SOLUTION | Freq: Four times a day (QID) | ORAL | Status: DC | PRN

## 2015-11-25 MED ORDER — LIDOCAINE 2% (20 MG/ML) 5 ML SYRINGE
INTRAMUSCULAR | Status: DC | PRN
  Administered 2015-11-25: 100 mg via INTRAVENOUS

## 2015-11-25 MED ORDER — PHENYLEPHRINE 40 MCG/ML (10ML) SYRINGE FOR IV PUSH (FOR BLOOD PRESSURE SUPPORT)
PREFILLED_SYRINGE | INTRAVENOUS | Status: AC
  Filled 2015-11-25: qty 10

## 2015-11-25 MED ORDER — THROMBIN 5000 UNITS EX SOLR
CUTANEOUS | Status: DC | PRN
  Administered 2015-11-25 (×2): 5000 [IU] via TOPICAL

## 2015-11-25 MED ORDER — CYCLOBENZAPRINE HCL 10 MG PO TABS
10.0000 mg | ORAL_TABLET | Freq: Three times a day (TID) | ORAL | Status: DC | PRN
  Filled 2015-11-25: qty 1

## 2015-11-25 MED ORDER — SODIUM CHLORIDE 0.9 % IV SOLN
INTRAVENOUS | Status: DC
  Administered 2015-11-25: 16:00:00 via INTRAVENOUS

## 2015-11-25 MED ORDER — ALBUTEROL SULFATE (2.5 MG/3ML) 0.083% IN NEBU: 3.0000 mL | INHALATION_SOLUTION | Freq: Four times a day (QID) | RESPIRATORY_TRACT | Status: DC | PRN

## 2015-11-25 MED ORDER — 0.9 % SODIUM CHLORIDE (POUR BTL) OPTIME
TOPICAL | Status: DC | PRN
  Administered 2015-11-25: 1000 mL

## 2015-11-25 MED ORDER — ROCURONIUM BROMIDE 10 MG/ML (PF) SYRINGE
PREFILLED_SYRINGE | INTRAVENOUS | Status: AC
  Filled 2015-11-25: qty 10

## 2015-11-25 SURGICAL SUPPLY — 52 items
BENZOIN TINCTURE PRP APPL 2/3 (GAUZE/BANDAGES/DRESSINGS) ×2 IMPLANT
BLADE CLIPPER SURG (BLADE) IMPLANT
BUR ACORN 6.0 (BURR) ×4 IMPLANT
BUR MATCHSTICK NEURO 3.0 LAGG (BURR) ×2 IMPLANT
CANISTER SUCT 3000ML PPV (MISCELLANEOUS) ×2 IMPLANT
DRAPE LAPAROTOMY 100X72X124 (DRAPES) ×2 IMPLANT
DRAPE MICROSCOPE LEICA (MISCELLANEOUS) ×2 IMPLANT
DRAPE POUCH INSTRU U-SHP 10X18 (DRAPES) ×2 IMPLANT
DRSG OPSITE POSTOP 4X8 (GAUZE/BANDAGES/DRESSINGS) ×2 IMPLANT
DRSG PAD ABDOMINAL 8X10 ST (GAUZE/BANDAGES/DRESSINGS) IMPLANT
DURAPREP 26ML APPLICATOR (WOUND CARE) ×2 IMPLANT
ELECT BLADE 4.0 EZ CLEAN MEGAD (MISCELLANEOUS) ×2
ELECT REM PT RETURN 9FT ADLT (ELECTROSURGICAL) ×2
ELECTRODE BLDE 4.0 EZ CLN MEGD (MISCELLANEOUS) ×1 IMPLANT
ELECTRODE REM PT RTRN 9FT ADLT (ELECTROSURGICAL) ×1 IMPLANT
EVACUATOR 3/16  PVC DRAIN (DRAIN) ×1
EVACUATOR 3/16 PVC DRAIN (DRAIN) ×1 IMPLANT
GAUZE SPONGE 4X4 12PLY STRL (GAUZE/BANDAGES/DRESSINGS) ×2 IMPLANT
GAUZE SPONGE 4X4 16PLY XRAY LF (GAUZE/BANDAGES/DRESSINGS) IMPLANT
GLOVE BIOGEL M 8.0 STRL (GLOVE) ×2 IMPLANT
GLOVE EXAM NITRILE LRG STRL (GLOVE) IMPLANT
GLOVE EXAM NITRILE XL STR (GLOVE) IMPLANT
GLOVE EXAM NITRILE XS STR PU (GLOVE) IMPLANT
GOWN STRL REUS W/ TWL LRG LVL3 (GOWN DISPOSABLE) ×1 IMPLANT
GOWN STRL REUS W/ TWL XL LVL3 (GOWN DISPOSABLE) IMPLANT
GOWN STRL REUS W/TWL 2XL LVL3 (GOWN DISPOSABLE) IMPLANT
GOWN STRL REUS W/TWL LRG LVL3 (GOWN DISPOSABLE) ×1
GOWN STRL REUS W/TWL XL LVL3 (GOWN DISPOSABLE)
KIT BASIN OR (CUSTOM PROCEDURE TRAY) ×2 IMPLANT
KIT ROOM TURNOVER OR (KITS) ×2 IMPLANT
NEEDLE HYPO 18GX1.5 BLUNT FILL (NEEDLE) IMPLANT
NEEDLE HYPO 21X1.5 SAFETY (NEEDLE) IMPLANT
NEEDLE HYPO 25X1 1.5 SAFETY (NEEDLE) ×2 IMPLANT
NEEDLE SPNL 20GX3.5 QUINCKE YW (NEEDLE) IMPLANT
NS IRRIG 1000ML POUR BTL (IV SOLUTION) ×2 IMPLANT
PACK LAMINECTOMY NEURO (CUSTOM PROCEDURE TRAY) ×2 IMPLANT
PAD ARMBOARD 7.5X6 YLW CONV (MISCELLANEOUS) ×6 IMPLANT
PATTIES SURGICAL .5 X1 (DISPOSABLE) ×2 IMPLANT
RUBBERBAND STERILE (MISCELLANEOUS) ×4 IMPLANT
SEALANT ADHERUS EXTEND TIP (MISCELLANEOUS) ×2 IMPLANT
SPONGE LAP 4X18 X RAY DECT (DISPOSABLE) IMPLANT
SPONGE SURGIFOAM ABS GEL SZ50 (HEMOSTASIS) ×2 IMPLANT
STAPLER VISISTAT 35W (STAPLE) ×2 IMPLANT
STRIP CLOSURE SKIN 1/2X4 (GAUZE/BANDAGES/DRESSINGS) ×2 IMPLANT
SUT VIC AB 0 CT1 18XCR BRD8 (SUTURE) ×1 IMPLANT
SUT VIC AB 0 CT1 8-18 (SUTURE) ×1
SUT VIC AB 2-0 CP2 18 (SUTURE) ×4 IMPLANT
SUT VIC AB 3-0 SH 8-18 (SUTURE) ×2 IMPLANT
SYR 5ML LL (SYRINGE) IMPLANT
TOWEL OR 17X24 6PK STRL BLUE (TOWEL DISPOSABLE) ×2 IMPLANT
TOWEL OR 17X26 10 PK STRL BLUE (TOWEL DISPOSABLE) ×2 IMPLANT
WATER STERILE IRR 1000ML POUR (IV SOLUTION) ×2 IMPLANT

## 2015-11-25 NOTE — Anesthesia Procedure Notes (Signed)

## 2015-11-25 NOTE — Anesthesia Postprocedure Evaluation (Signed)
Anesthesia Post Note  Patient: Michon Hirota Loughry  Procedure(s) Performed: Procedure(s) (LRB): Lumbar three- four Lumbar four- five Laminectomy/Foraminotomy (N/A)  Patient location during evaluation: PACU Anesthesia Type: General Level of consciousness: awake and alert Pain management: pain level controlled Vital Signs Assessment: post-procedure vital signs reviewed and stable Respiratory status: spontaneous breathing, nonlabored ventilation, respiratory function stable and patient connected to nasal cannula oxygen Cardiovascular status: blood pressure returned to baseline and stable Postop Assessment: no signs of nausea or vomiting Anesthetic complications: no    Last Vitals:  Vitals:   11/25/15 1617 11/25/15 1630  BP:  122/77  Pulse:  60  Resp: 11 12  Temp:      Last Pain:  Vitals:   11/25/15 1630  TempSrc:   PainSc: 5                  Justin Meisenheimer,W. EDMOND

## 2015-11-25 NOTE — Transfer of Care (Signed)
Immediate Anesthesia Transfer of Care Note  Patient: Kathyrn Drown Yohn  Procedure(s) Performed: Procedure(s) with comments: Lumbar three- four Lumbar four- five Laminectomy/Foraminotomy (N/A) - L3-4 L4-5 Laminectomy/Foraminotomy/possible Lumbar Drain  Patient Location: PACU  Anesthesia Type:General  Level of Consciousness: awake, alert  and oriented  Airway & Oxygen Therapy: Patient Spontanous Breathing and Patient connected to face mask oxygen  Post-op Assessment: Report given to RN, Post -op Vital signs reviewed and stable and Patient moving all extremities  Post vital signs: Reviewed and stable  Last Vitals:  Vitals:   11/25/15 0849 11/25/15 1530  BP: (!) 147/77 112/68  Pulse: 60   Resp: 18   Temp: 36.6 C     Last Pain:  Vitals:   11/25/15 1530  TempSrc:   PainSc: Asleep         Complications: No apparent anesthesia complications

## 2015-11-25 NOTE — H&P (Signed)
Peter Barnett is an 55 y.o. male.   Chief Complaint: back painHPI: patient who in the past underwent lumbar decompression. Lately he has been complaining of bilateral leg pain associated with ambulation no better with conservative. Outpatient myelo showed stenosis from l5 to l5 severe with overgrowth of the bone into the canal and displacement the thecal sac.  Past Medical History:  Diagnosis Date  . CAD (coronary artery disease)    Premature; stents + known distal RCA/PDA occlusion  . Chronic lower back pain    Still with recurrent left sided LBP with paresthesias down left leg --primarily with activities: pain pill use is avg <90 tabs per mo  . COPD (chronic obstructive pulmonary disease) ([place])    NOTED on CT chest 11/2013 done for lung ca screening.  Hosp for acute exac 11/2014.  Spirometry not supportive of this dx as of pulm eval 2017, though.  Spireva trial by Dr. Elsworth Soho 02/2015.  Marland Kitchen Dysrhythmia    "flutters"  . Exertional dyspnea 10/07/2011  . GERD (gastroesophageal reflux disease)   . History of adenomatous polyp of colon 02/2009;02/2014   Recall 5 yrs ([facility])  . History of kidney stones   . HTN (hypertension)   . Hyperlipidemia   . Intracranial hemorrhage ([place]) 12/27/14   small acute hemorrhage in gliotic medial R frontal lobe  . Memory loss   . MI (myocardial infarction) 2009   "just had arm pain; never any chest pain"  . Migraine syndrome 10/07/2011    a couple per month usually, but worsening 2017 so Dr. Krista Blue adjusted med  . OSA (obstructive sleep apnea)    Dr. Elsworth Soho feels like he has OSA but pt declines testing as of 02/2015  . 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 Pneumonia ~ 2010   "2 years in a row; May both times"  . Seizures ([place])    last yr ago  . Short-term memory loss 10/07/2011   "post brain OR & from his seizures"--worsening 2017, Dr. Krista Blue in neurology doing w/u.  Marland Kitchen Stroke ([place]) 12/2014  . Tobacco abuse   . Urethral stricture since 1981    Recurrent dilation has been required [facility] urology)    Past Surgical History:  Procedure Laterality Date  . CARDIAC CATHETERIZATION  2011   [facility], records unavailable: pt reports "normal".  . COLONOSCOPY W/ POLYPECTOMY  02/2009;02/2014   Recall 5 yr (aden poly, hyperplast polyp, int and ext hemorr).  Next recall is 02/2019.  Marland Kitchen CORONARY ANGIOPLASTY WITH STENT PLACEMENT  2009   3 DES to LCx; still with known distal RCA/PDA occlusion.  EF 60% by LV-gram.  . CRANIOTOMY FOR AVM  1981   Dx'd after pt began having seizures.  . CT ANGIOGRAM (CEREBRAL)  12/31/14   NORMAL--plan per neuro is to repeat this in 3 mo.  Pt states it was repeated Spring 2017 and was "fine"  . [place] SURGERY  2008; 2009   Dr. Joya Salm  . TONSILLECTOMY     "I was little"  . TRANSTHORACIC ECHOCARDIOGRAM  12/07/14   Normal LV size/fxn, EF 60%, normal valves.    Family History  Problem Relation Age of Onset  . Hyperlipidemia Mother   . Cancer Father     Mesothelioma  . Hypertension Father   . Asthma Father   . Heart attack Paternal Uncle     In 70s too  . Stroke Neg Hx    Social History:  reports that he quit smoking about 14 years ago.  His smoking use included Cigarettes. He has a 81.00 pack-year smoking history. He has never used smokeless tobacco. He reports that he drinks about 4.2 oz of alcohol per week . He reports that he does not use drugs.  Allergies:  Allergies  Allergen Reactions  . Tramadol Other (See Comments)    SEIZURES "Staring off spells"    Medications Prior to Admission  Medication Sig Dispense Refill  . acetaminophen (TYLENOL) 500 MG tablet Take 1,000 mg by mouth every 6 (six) hours as needed for headache.    . albuterol (PROVENTIL HFA;VENTOLIN HFA) 108 (90 BASE) MCG/ACT inhaler Inhale 2 puffs into the lungs every 6 (six) hours as needed for wheezing or shortness of breath. 1 Inhaler 2  . aspirin EC 81 MG tablet Take 81 mg by mouth daily.    Marland Kitchen atorvastatin (LIPITOR) 40 MG tablet  Take 1 tablet (40 mg total) by mouth daily. 90 tablet 1  . butalbital-acetaminophen-caffeine (FIORICET, ESGIC) 50-325-40 MG tablet Take 1 tablet by mouth every 6 (six) hours as needed for headache. 12 tablet 5  . Cholecalciferol 1000 units tablet Take 1,000 Units by mouth daily.    . divalproex (DEPAKOTE ER) 500 MG 24 hr tablet Take 2 tablets (1,000 mg total) by mouth at bedtime. 60 tablet 11  . docusate sodium (COLACE) 100 MG capsule Take 200 mg by mouth daily.     . famotidine (PEPCID) 20 MG tablet Take 20 mg by mouth daily.    Marland Kitchen HYDROcodone-acetaminophen (NORCO) 10-325 MG per tablet Take 1 tablet by mouth every 6 (six) hours as needed (pain).     . metoprolol tartrate (LOPRESSOR) 25 MG tablet Take 0.5 tablets (12.5 mg total) by mouth 2 (two) times daily. 30 tablet 12  . oxcarbazepine (TRILEPTAL) 600 MG tablet Take 1 tablet (600 mg total) by mouth 2 (two) times daily. 180 tablet 4  . nitroGLYCERIN (NITROSTAT) 0.4 MG SL tablet Place 1 tablet (0.4 mg total) under the tongue every 5 (five) minutes as needed for chest pain (CP or SOB). (Patient taking differently: Place 0.4 mg under the tongue every 5 (five) minutes as needed (chest pain or shortness of breath). ) 25 tablet 3    No results found for this or any previous visit (from the past 48 hour(s)). No results found.  Review of Systems  Constitutional: Negative.   Cardiovascular: Negative.   Genitourinary: Negative.   Musculoskeletal: Positive for back pain.  Neurological: Positive for seizures and headaches.  Psychiatric/Behavioral: Negative.     Blood pressure (!) 147/77, pulse 60, temperature 97.9 F (36.6 C), temperature source Oral, resp. rate 18, height 5\' 10"  (1.778 m), weight 115.4 kg (254 lb 6.4 oz), SpO2 97 %. Physical Exam heent, ols scalp scar from cerebral avm resection. Neck, nl.  Cv, nl. Abdomen, nl. Lungs, clear. Extremities,nl. Neuro, SLR positive bilaterally with a positive femoral stretch  maneuver Assessment/Plan decompresion from l3 to l5. He and his wife are aware of rirsk such a csf leak which might need a lumbar drain and bed rest for 72 hours, hematoma, infection, more surgery and all the risks asoocuated with his avm  Floyce Stakes, MD 11/25/2015, 11:34 AM

## 2015-11-26 ENCOUNTER — Encounter (HOSPITAL_COMMUNITY): Payer: Self-pay | Admitting: Neurosurgery

## 2015-11-26 NOTE — Evaluation (Signed)
Occupational Therapy Evaluation and Discharge Patient Details Name: JOBANI ARABIE MRN: [id number] DOB: [birth year] Today's Date: 11/26/2015    History of Present Illness pt is a 55 y/o male with h/o CAD, COPD, HTN and Chronic LBP, admitted with bil leg pain, s/p decompression of the thecal sac at L3-5, removal of osteophytes and foraminotamy and L34, L45, and L5S1 nerve roots.   Clinical Impression   Pt reports he was independent with ADL PTA. Currently pt overall supervision for safety with ADL and functional mobility with the exception of mod assist for LB ADL; wife to assist as needed. All back, safety, and ADL education completed with pt and wife. Pt planning to d/c home with 24/7 supervision from family. No further acute OT needs identified; signing off at this time. Please re-consult if needs change. Thank you for this referral.    Follow Up Recommendations  No OT follow up;Supervision/Assistance - 24 hour (initially)    Equipment Recommendations  3 in 1 bedside comode    Recommendations for Other Services       Precautions / Restrictions Precautions Precautions: Back Precaution Comments: Pt able to recall 2/3 back precautions. Reviewed all precautions with pt and wife. Required Braces or Orthoses: Spinal Brace Spinal Brace: Lumbar corset;Applied in sitting position Restrictions Weight Bearing Restrictions: No      Mobility Bed Mobility Overal bed mobility: Needs Assistance Bed Mobility: Sit to Sidelying         Sit to sidelying: Supervision General bed mobility comments: VCs for technique. HOB flat without use of bed rail.  Transfers Overall transfer level: Needs assistance Equipment used: None Transfers: Sit to/from Stand Sit to Stand: Supervision         General transfer comment: supervision for safety.     Balance Overall balance assessment: Needs assistance Sitting-balance support: Feet supported;No upper extremity supported Sitting  balance-Leahy Scale: Good     Standing balance support: No upper extremity supported Standing balance-Leahy Scale: Good                              ADL Overall ADL's : Needs assistance/impaired Eating/Feeding: Modified independent;Sitting   Grooming: Supervision/safety;Standing Grooming Details (indicate cue type and reason): Educated pt on use of 2 cups for oral care Upper Body Bathing: Set up;Sitting   Lower Body Bathing: Minimal assistance;Sit to/from stand   Upper Body Dressing : Set up;Sitting Upper Body Dressing Details (indicate cue type and reason): Educated pt on proper donning/doffing of back brace; pt able to return demo. Lower Body Dressing: Moderate assistance;Sit to/from stand Lower Body Dressing Details (indicate cue type and reason): Pt unable to cross foot over opposite knee. Wife to assist as needed. Educated on compensatory strategies for LB ADL. Toilet Transfer: Supervision/safety;Ambulation;BSC Toilet Transfer Details (indicate cue type and reason): Pt able to perfrom toilet transfer without use of 3 in 1 but with difficulty; recommend 3 in 1 for home. Toileting- Clothing Manipulation and Hygiene: Supervision/safety;Sit to/from stand Toileting - Clothing Manipulation Details (indicate cue type and reason): Educated pt on proper peri care technique. Tub/ Shower Transfer: Supervision/safety;Walk-in shower;Ambulation;Shower Scientist, research (medical) Details (indicate cue type and reason): Pt able to simulate shower transfer without LOB or unsteadiness. Educated on supervision for safety initially upon return home for shower transfer. Functional mobility during ADLs: Supervision/safety General ADL Comments: Educated pt on maintaining back precautions during functional activities, log roll technique for bed mobility, brace management and wear schedule, proper positioning  in bed.      Vision Vision Assessment?: No apparent visual deficits   Perception      Praxis      Pertinent Vitals/Pain Pain Assessment: Faces Faces Pain Scale: Hurts even more Pain Location: back Pain Descriptors / Indicators: Aching;Sore Pain Intervention(s): Monitored during session;Repositioned;Premedicated before session     Hand Dominance Right   Extremity/Trunk Assessment Upper Extremity Assessment Upper Extremity Assessment: Overall WFL for tasks assessed   Lower Extremity Assessment Lower Extremity Assessment: Defer to PT evaluation   Cervical / Trunk Assessment Cervical / Trunk Assessment: Other exceptions Cervical / Trunk Exceptions: s/p spinal sx   Communication Communication Communication: No difficulties   Cognition Arousal/Alertness: Awake/alert Behavior During Therapy: WFL for tasks assessed/performed Overall Cognitive Status: Within Functional Limits for tasks assessed                     General Comments       Exercises       Shoulder Instructions      Home Living Family/patient expects to be discharged to:: Private residence Living Arrangements: Spouse/significant other Available Help at Discharge: Family;Available 24 hours/day Type of Home: House Home Access: Stairs to enter [employer] of Steps: 4 Entrance Stairs-Rails: Right;Left Home Layout: One level     Bathroom Shower/Tub: Walk-in shower;Tub/shower unit   [employer]: Standard     Home Equipment: Civil engineer, contracting - built in;None          Prior Functioning/Environment Level of Independence: Independent                 OT Problem List:     OT Treatment/Interventions:      OT Goals(Current goals can be found in the care plan section) Acute Rehab OT Goals Patient Stated Goal: return home OT Goal Formulation: All assessment and education complete, DC therapy  OT Frequency:     Barriers to D/C:            Co-evaluation              End of Session Equipment Utilized During Treatment: Back brace  Activity Tolerance:  Patient tolerated treatment well Patient left: in bed;with call bell/phone within reach   Time: [id number] OT Time Calculation (min): 15 min Charges:  OT General Charges $OT Visit: 1 Procedure OT Evaluation $OT Eval Moderate Complexity: 1 Procedure G-Codes: OT G-codes **NOT FOR INPATIENT CLASS** Functional Assessment Tool Used: Clinical judgement Functional Limitation: Self care Self Care Current Status [id number]): At least 1 percent but less than 20 percent impaired, limited or restricted Self Care Goal Status [id number]): At least 1 percent but less than 20 percent impaired, limited or restricted Self Care Discharge Status [phone]): At least 1 percent but less than 20 percent impaired, limited or restricted   Binnie Kand M.S., OTR/L Pager: [phone]  11/26/2015, 3:52 PM

## 2015-11-26 NOTE — Progress Notes (Signed)
OT Cancellation Note  Patient Details Name: Peter Barnett MRN: [id number] DOB: 10-Jul-1960   Cancelled Treatment:    Reason Eval/Treat Not Completed: Other (comment) (eating breakfast and no brace present). Will follow up for OT eval as time allows.  Binnie Kand M.S., OTR/L Pager: [phone]  11/26/2015, 7:58 AM

## 2015-11-26 NOTE — Evaluation (Signed)
Physical Therapy Evaluation Patient Details Name: TYWAIN SULKOWSKI MRN: [id number] DOB: [birth year] Today's Date: 11/26/2015   History of Present Illness  pt is a 55 y/o male with h/o CAD, COPD, HTN and Chronic LBP, admitted with bil leg pain, s/p decompression of the thecal sac at L3-5, removal of osteophytes and foraminotamy and L34, L45, and L5S1 nerve roots.  Clinical Impression  Pt admitted with/for lumbar fusion surgerty.  Pt currently limited functionally due to the problems listed below.  (see problems list.)  Pt will benefit from PT to maximize function and safety to be able to get home safely with available assist of family    Follow Up Recommendations      Equipment Recommendations       Recommendations for Other Services       Precautions / Restrictions Precautions Precautions: Back Precaution Comments: Pt able to recall 2/3 back precautions. Reviewed all precautions with pt and wife. Required Braces or Orthoses: Spinal Brace Spinal Brace: Lumbar corset;Applied in sitting position Restrictions Weight Bearing Restrictions: No      Mobility  Bed Mobility Overal bed mobility: Needs Assistance Bed Mobility: Sit to Sidelying         Sit to sidelying: Supervision General bed mobility comments: VCs for technique. HOB flat without use of bed rail.  Transfers Overall transfer level: Needs assistance Equipment used: None Transfers: Sit to/from Stand Sit to Stand: Supervision         General transfer comment: supervision for safety.   Ambulation/Gait                Stairs            Wheelchair Mobility    Modified Rankin (Stroke Patients Only)       Balance Overall balance assessment: Needs assistance Sitting-balance support: Feet supported;No upper extremity supported Sitting balance-Leahy Scale: Good     Standing balance support: No upper extremity supported Standing balance-Leahy Scale: Good                                Pertinent Vitals/Pain Pain Assessment: Faces Faces Pain Scale: Hurts even more Pain Location: back Pain Descriptors / Indicators: Aching;Sore Pain Intervention(s): Monitored during session;Repositioned;Premedicated before session    [place] expects to be discharged to:: Private residence Living Arrangements: Spouse/significant other Available Help at Discharge: Family;Available 24 hours/day Type of Home: House Home Access: Stairs to enter Entrance Stairs-Rails: Psychiatric nurse of Steps: 4 Home Layout: One level Home Equipment: Shower seat - built in;None      Prior Function Level of Independence: Independent               Hand Dominance   Dominant Hand: Right    Extremity/Trunk Assessment   Upper Extremity Assessment: Overall WFL for tasks assessed           Lower Extremity Assessment: Defer to PT evaluation      Cervical / Trunk Assessment: Other exceptions  Communication   Communication: No difficulties  Cognition Arousal/Alertness: Awake/alert Behavior During Therapy: WFL for tasks assessed/performed Overall Cognitive Status: Within Functional Limits for tasks assessed                      General Comments      Exercises     Assessment/Plan    PT Assessment    PT Problem List  PT Treatment Interventions      PT Goals (Current goals can be found in the Care Plan section)  Acute Rehab PT Goals Patient Stated Goal: return home    Frequency     Barriers to discharge        Co-evaluation               End of Session                 Time:  -      Charges:         PT G Codes:        Daizy Outen, Tessie Fass 11/26/2015, 5:22 PM 11/26/2015  Donnella Sham, PT [phone]  (pager)

## 2015-11-26 NOTE — Progress Notes (Signed)
Patient stated morphine was making him feel nauseated, night nurse had turned PCA off. Morphine wasted in pyxis.  Aeon Koors, Tivis Ringer, RN

## 2015-11-26 NOTE — Progress Notes (Signed)
Patient ID: Peter Barnett, male   DOB: 09/25/1960, 55 y.o.   MRN: [id number] Doing well, no numbness as preop. No weakness. Pt/ot to see.

## 2015-11-26 NOTE — Op Note (Signed)
NAME:  Peter Barnett, Peter Barnett               ACCOUNT NO.:  [id number]  MEDICAL RECORD NO.:  [id number]  LOCATION:  5C13C                        FACILITY:  [facility]  PHYSICIAN:  Leeroy Cha, M.D.   DATE OF BIRTH:  03-15-1960  DATE OF PROCEDURE:  11/25/2015 DATE OF DISCHARGE:                              OPERATIVE REPORT   PREOPERATIVE DIAGNOSES:  Lumbar stenosis, recurrent.  Chronic radiculopathy.  History of cerebral brain arteriovenous malformation.  POSTOPERATIVE DIAGNOSES:  Lumbar stenosis, recurrent.  Chronic radiculopathy.  History of cerebral brain arteriovenous malformation.  PROCEDURES:  Bilateral 3, 4, 5 decompression of the thecal sac.  Removal of osteophyte.  Lysis of adhesion.  Foraminotomy to decompress the L3-4, 4-5, 5-1 nerve root.  Microscope.  SURGEON:  Leeroy Cha, M.D.  ASSISTANT:  Dr. Cyndy Freeze.  CLINICAL HISTORY:  The Wilczak is a gentleman, who in the past underwent surgery by me and by my partner many years ago.  The patient being stable.  Lately, he had been complaining of back pain with radiation to both lower extremities.  He was seen by me in the emergency room several months ago after he had small bleed into the brain.  Workup was essentially negative.  Nevertheless, he continued to have back pain with claudication.  X-rays show overgrowth of the facet with stenosis from L3- 4 down to L5-S1.  He and his wife knew the risk with the surgery including the possibility of needing of a lumbar catheter to drain CSF for possibility of leak.  DESCRIPTION OF PROCEDURE:  The patient was taken to the OR and after intubation, he was positioned in a prone manner.  The back was cleaned with DuraPrep.  Drapes were applied.  Incision through the previous scar was done from L3 down to L5-S1.  Muscle was retracted laterally.  With the help of the microscope using microdissection, we were able to see the facet of 3-4, 4-5 and 5-1.  Then using the small sterile drill in the  lateral aspect of the lamina and the facet, first on the right side and then on the left side.  The patient had quite a bit of adhesion and the worse was at the level of 4-5 with some bone digging into the thecal space.  Decompression was achieved.  Then, using the 1 and 2-mm Kerrison punch, we were able to decompress the L3-4, L4-5 and L5-1 foramen.  At the end, we had plenty of space.  Valsalva maneuver done three times was negative.  From then on, the area was irrigated.  A drain was left in the epidural space and the wound was closed with Vicryl and staple.         ______________________________ Leeroy Cha, M.D.    EB/MEDQ  D:  11/25/2015  T:  11/26/2015  Job:  [id number]

## 2015-11-26 NOTE — Progress Notes (Signed)
Orthopedic Tech Progress Note Patient Details:  Peter Barnett 05-05-1960 [id number]  Patient ID: Peter Barnett, male   DOB: 19-Feb-1960, 55 y.o.   MRN: [id number]   [name] 11/26/2015, 10:10 AM Called in bio-tech brace order; spoke with Colletta [place]

## 2015-11-27 DIAGNOSIS — K219 Gastro-esophageal reflux disease without esophagitis: Secondary | ICD-10-CM | POA: Diagnosis not present

## 2015-11-27 DIAGNOSIS — Z955 Presence of coronary angioplasty implant and graft: Secondary | ICD-10-CM | POA: Diagnosis not present

## 2015-11-27 DIAGNOSIS — Z8249 Family history of ischemic heart disease and other diseases of the circulatory system: Secondary | ICD-10-CM | POA: Diagnosis not present

## 2015-11-27 DIAGNOSIS — I2510 Atherosclerotic heart disease of native coronary artery without angina pectoris: Secondary | ICD-10-CM | POA: Diagnosis not present

## 2015-11-27 DIAGNOSIS — M5126 Other intervertebral disc displacement, lumbar region: Secondary | ICD-10-CM | POA: Diagnosis present

## 2015-11-27 DIAGNOSIS — E785 Hyperlipidemia, unspecified: Secondary | ICD-10-CM | POA: Diagnosis not present

## 2015-11-27 DIAGNOSIS — I252 Old myocardial infarction: Secondary | ICD-10-CM | POA: Diagnosis not present

## 2015-11-27 DIAGNOSIS — Z8673 Personal history of transient ischemic attack (TIA), and cerebral infarction without residual deficits: Secondary | ICD-10-CM | POA: Diagnosis not present

## 2015-11-27 DIAGNOSIS — Z7982 Long term (current) use of aspirin: Secondary | ICD-10-CM | POA: Diagnosis not present

## 2015-11-27 DIAGNOSIS — I10 Essential (primary) hypertension: Secondary | ICD-10-CM | POA: Diagnosis not present

## 2015-11-27 DIAGNOSIS — G4733 Obstructive sleep apnea (adult) (pediatric): Secondary | ICD-10-CM | POA: Diagnosis not present

## 2015-11-27 DIAGNOSIS — M79605 Pain in left leg: Secondary | ICD-10-CM | POA: Diagnosis present

## 2015-11-27 DIAGNOSIS — Z825 Family history of asthma and other chronic lower respiratory diseases: Secondary | ICD-10-CM | POA: Diagnosis not present

## 2015-11-27 DIAGNOSIS — M48062 Spinal stenosis, lumbar region with neurogenic claudication: Secondary | ICD-10-CM | POA: Diagnosis not present

## 2015-11-27 DIAGNOSIS — Z87891 Personal history of nicotine dependence: Secondary | ICD-10-CM | POA: Diagnosis not present

## 2015-11-27 MED ORDER — OXYCODONE-ACETAMINOPHEN 5-325 MG PO TABS: 1.0000 | ORAL_TABLET | ORAL | Status: DC | PRN

## 2015-11-27 MED ORDER — DIPHENHYDRAMINE HCL 50 MG/ML IJ SOLN: 12.5000 mg | Freq: Four times a day (QID) | INTRAMUSCULAR | Status: DC | PRN

## 2015-11-27 MED ORDER — CEFAZOLIN IN D5W 1 GM/50ML IV SOLN
1.0000 g | Freq: Three times a day (TID) | INTRAVENOUS | Status: DC
  Filled 2015-11-27 (×2): qty 50

## 2015-11-27 MED ORDER — ONDANSETRON HCL 4 MG/2ML IJ SOLN: 4.0000 mg | INTRAMUSCULAR | Status: DC | PRN

## 2015-11-27 MED ORDER — NALOXONE HCL 0.4 MG/ML IJ SOLN: 0.4000 mg | INTRAMUSCULAR | Status: DC | PRN

## 2015-11-27 MED ORDER — HYDROMORPHONE 1 MG/ML IV SOLN: INTRAVENOUS | Status: DC

## 2015-11-27 MED ORDER — SODIUM CHLORIDE 0.9% FLUSH: 3.0000 mL | Freq: Two times a day (BID) | INTRAVENOUS | Status: DC

## 2015-11-27 MED ORDER — SODIUM CHLORIDE 0.9% FLUSH: 9.0000 mL | INTRAVENOUS | Status: DC | PRN

## 2015-11-27 MED ORDER — ZOLPIDEM TARTRATE 5 MG PO TABS: 5.0000 mg | ORAL_TABLET | Freq: Every evening | ORAL | Status: DC | PRN

## 2015-11-27 MED ORDER — ONDANSETRON HCL 4 MG/2ML IJ SOLN: 4.0000 mg | Freq: Four times a day (QID) | INTRAMUSCULAR | Status: DC | PRN

## 2015-11-27 MED ORDER — SODIUM CHLORIDE 0.9% FLUSH: 3.0000 mL | INTRAVENOUS | Status: DC | PRN

## 2015-11-27 MED ORDER — SODIUM CHLORIDE 0.9 % IV SOLN: INTRAVENOUS | Status: DC

## 2015-11-27 MED ORDER — SODIUM CHLORIDE 0.9 % IV SOLN: 250.0000 mL | INTRAVENOUS | Status: DC

## 2015-11-27 MED ORDER — CYCLOBENZAPRINE HCL 10 MG PO TABS: 10.0000 mg | ORAL_TABLET | Freq: Three times a day (TID) | ORAL | Status: DC | PRN

## 2015-11-27 MED ORDER — DIPHENHYDRAMINE HCL 12.5 MG/5ML PO ELIX: 12.5000 mg | ORAL_SOLUTION | Freq: Four times a day (QID) | ORAL | Status: DC | PRN

## 2015-11-27 MED ORDER — SENNA 8.6 MG PO TABS: 1.0000 | ORAL_TABLET | Freq: Two times a day (BID) | ORAL | Status: DC

## 2015-11-27 NOTE — Progress Notes (Signed)
Patient A/O, no noted distress. Continue to c/o pain lower back 6/10. D/c hemovac and IV. OOB in chair ready to go. Patient has all belongings.

## 2015-11-27 NOTE — Care Management Note (Signed)
Case Management Note  Patient Details  Name: Peter Barnett MRN: [id number] Date of Birth: 07/21/1960  Subjective/Objective:    Pt s/p lumbar surgery. He is from home with his wife.                 Action/Plan: Patient discharging home today with orders 3 in 1. Jermaine with [facility] DME notified and will deliver the equipment to the room. Pt's wife states he will have 24 hour supervision at discharge. Will update the bedside RN.   Expected Discharge Date:                  Expected Discharge Plan:  Home/Self Care  In-House Referral:     Discharge planning Services  CM Consult  Post Acute Care Choice:  Durable Medical Equipment Choice offered to:  Patient, Spouse  DME Arranged:  3-N-1 DME Agency:     HH Arranged:    [place] Agency:     Status of Service:  Completed, signed off  If discussed at [place] of Stay Meetings, dates discussed:    Additional Comments:  Pollie Friar, RN 11/27/2015, 10:08 AM

## 2015-11-27 NOTE — Progress Notes (Signed)
Physical Therapy Treatment Patient Details Name: CHANON CROES MRN: [id number] DOB: September 24, 1960 Today's Date: 11/27/2015    History of Present Illness pt is a 55 y/o male with h/o CAD, COPD, HTN and Chronic LBP, admitted with bil leg pain, s/p decompression of the thecal sac at L3-5, removal of osteophytes and foraminotamy and L34, L45, and L5S1 nerve roots.    PT Comments    Improving steadily.  He and wife will manage well at home.  All education completed.  Follow Up Recommendations  No PT follow up     Equipment Recommendations  Other (comment) (OT rquested 3 in 1)    Recommendations for Other Services       Precautions / Restrictions Precautions Precautions: Back Precaution Booklet Issued: Yes (comment) Required Braces or Orthoses: Spinal Brace Spinal Brace: Lumbar corset;Applied in sitting position Restrictions Weight Bearing Restrictions: No    Mobility  Bed Mobility               General bed mobility comments: reviewed technique  Transfers Overall transfer level: Needs assistance   Transfers: Sit to/from Stand Sit to Stand: Supervision            Ambulation/Gait Ambulation/Gait assistance: Supervision Ambulation Distance (Feet): 380 Feet Assistive device: None Gait Pattern/deviations: Step-through pattern   Gait velocity interpretation: at or above normal speed for age/gender General Gait Details: steady, not much ability to increase speed from baseline   Stairs Stairs: Yes Stairs assistance: Supervision Stair Management: One rail Right;Alternating pattern;Step to pattern;Forwards Number of Stairs: 10 General stair comments: steady, non jarring,  used Customer service manager    Modified Rankin (Stroke Patients Only)       Balance Overall balance assessment: Needs assistance   Sitting balance-Leahy Scale: Good       Standing balance-Leahy Scale: Good                      Cognition Arousal/Alertness:  Awake/alert Behavior During Therapy: WFL for tasks assessed/performed Overall Cognitive Status: Within Functional Limits for tasks assessed                      Exercises      General Comments General comments (skin integrity, edema, etc.): reviewed all back education with wife and pt.      Pertinent Vitals/Pain Pain Assessment: 0-10 Pain Score: 6  Pain Location: back Pain Descriptors / Indicators: Aching;Sore Pain Intervention(s): Monitored during session;Repositioned    Home Living                      Prior Function            PT Goals (current goals can now be found in the care plan section) Acute Rehab PT Goals Patient Stated Goal: return home PT Goal Formulation: With patient Time For Goal Achievement: 12/03/15 Potential to Achieve Goals: Good Progress towards PT goals: Progressing toward goals    Frequency    Min 5X/week      PT Plan Current plan remains appropriate    Co-evaluation             End of Session   Activity Tolerance: Patient tolerated treatment well Patient left: in chair;with call bell/phone within reach     Time: 0953-1018 PT Time Calculation (min) (ACUTE ONLY): 25 min  Charges:  $Gait Training: 8-22 mins $Therapeutic Activity: 8-22 mins  G Codes:      Reverie Vaquera, Tessie Fass 11/27/2015, 10:35 AM 11/27/2015  Donnella Sham, [place] [phone]  (pager)

## 2015-11-27 NOTE — Discharge Summary (Signed)
Physician Discharge Summary  Patient ID: Peter Barnett MRN: [id number] DOB/AGE: 1960/07/19 55 y.o.  Admit date: 11/25/2015 Discharge date: 11/27/2015  Admission Diagnoses:lumbar stenosis  Discharge Diagnoses:  Active Problems:   Lumbar canal stenosis   Discharged Condition:no pain. Sensory normal  Hospital Course:lumbar decompresssion  Consults: none  Significant Diagnostic Studies: lumbar myelogram  Treatments: lumbar decompression  Discharge Exam: Blood pressure 108/61, pulse 64, temperature 98.2 F (36.8 C), temperature source Oral, resp. rate 18, height 5\' 10"  (1.778 m), weight 115.4 kg (254 lb 6.4 oz), SpO2 94 %. Ambulating, no weakness  Disposition: 01-Home or Self Care      Signed: Kirin Pastorino M 11/27/2015, 9:37 AM

## 2015-11-27 NOTE — Progress Notes (Signed)
Patient ambulated this morning in hall back brace only set up assistance only.

## 2015-12-12 ENCOUNTER — Inpatient Hospital Stay (HOSPITAL_COMMUNITY)
Admission: EM | Admit: 2015-12-12 | Discharge: 2015-12-16 | DRG: 247 | Disposition: A | Discharge status: Home / Self Care | Payer: Medicare HMO | Attending: Cardiovascular Disease | Admitting: Cardiovascular Disease

## 2015-12-12 ENCOUNTER — Emergency Department (HOSPITAL_COMMUNITY): Payer: Medicare HMO

## 2015-12-12 ENCOUNTER — Encounter (HOSPITAL_COMMUNITY): Payer: Self-pay | Admitting: Emergency Medicine

## 2015-12-12 DIAGNOSIS — Z7982 Long term (current) use of aspirin: Secondary | ICD-10-CM | POA: Diagnosis not present

## 2015-12-12 DIAGNOSIS — R569 Unspecified convulsions: Secondary | ICD-10-CM | POA: Diagnosis not present

## 2015-12-12 DIAGNOSIS — I10 Essential (primary) hypertension: Secondary | ICD-10-CM | POA: Diagnosis not present

## 2015-12-12 DIAGNOSIS — T380X5A Adverse effect of glucocorticoids and synthetic analogues, initial encounter: Secondary | ICD-10-CM | POA: Diagnosis present

## 2015-12-12 DIAGNOSIS — Z951 Presence of aortocoronary bypass graft: Secondary | ICD-10-CM

## 2015-12-12 DIAGNOSIS — I2510 Atherosclerotic heart disease of native coronary artery without angina pectoris: Secondary | ICD-10-CM | POA: Diagnosis not present

## 2015-12-12 DIAGNOSIS — Z825 Family history of asthma and other chronic lower respiratory diseases: Secondary | ICD-10-CM

## 2015-12-12 DIAGNOSIS — E785 Hyperlipidemia, unspecified: Secondary | ICD-10-CM | POA: Diagnosis present

## 2015-12-12 DIAGNOSIS — R0789 Other chest pain: Secondary | ICD-10-CM | POA: Diagnosis not present

## 2015-12-12 DIAGNOSIS — Z8249 Family history of ischemic heart disease and other diseases of the circulatory system: Secondary | ICD-10-CM | POA: Diagnosis not present

## 2015-12-12 DIAGNOSIS — I252 Old myocardial infarction: Secondary | ICD-10-CM

## 2015-12-12 DIAGNOSIS — R739 Hyperglycemia, unspecified: Secondary | ICD-10-CM | POA: Diagnosis present

## 2015-12-12 DIAGNOSIS — Z955 Presence of coronary angioplasty implant and graft: Secondary | ICD-10-CM

## 2015-12-12 DIAGNOSIS — E7800 Pure hypercholesterolemia, unspecified: Secondary | ICD-10-CM

## 2015-12-12 DIAGNOSIS — R7989 Other specified abnormal findings of blood chemistry: Secondary | ICD-10-CM

## 2015-12-12 DIAGNOSIS — R079 Chest pain, unspecified: Secondary | ICD-10-CM | POA: Diagnosis not present

## 2015-12-12 DIAGNOSIS — I214 Non-ST elevation (NSTEMI) myocardial infarction: Principal | ICD-10-CM | POA: Diagnosis present

## 2015-12-12 DIAGNOSIS — Z7952 Long term (current) use of systemic steroids: Secondary | ICD-10-CM | POA: Diagnosis not present

## 2015-12-12 DIAGNOSIS — Z87891 Personal history of nicotine dependence: Secondary | ICD-10-CM

## 2015-12-12 DIAGNOSIS — K219 Gastro-esophageal reflux disease without esophagitis: Secondary | ICD-10-CM | POA: Diagnosis present

## 2015-12-12 DIAGNOSIS — I219 Acute myocardial infarction, unspecified: Secondary | ICD-10-CM

## 2015-12-12 DIAGNOSIS — Z809 Family history of malignant neoplasm, unspecified: Secondary | ICD-10-CM | POA: Diagnosis not present

## 2015-12-12 DIAGNOSIS — R778 Other specified abnormalities of plasma proteins: Secondary | ICD-10-CM

## 2015-12-12 DIAGNOSIS — J449 Chronic obstructive pulmonary disease, unspecified: Secondary | ICD-10-CM | POA: Diagnosis present

## 2015-12-12 HISTORY — DX: Acute myocardial infarction, unspecified: I21.9

## 2015-12-12 LAB — COMPREHENSIVE METABOLIC PANEL
ALK PHOS: 92 U/L (ref 38–126)
ALT: 33 U/L (ref 17–63)
ANION GAP: 9 (ref 5–15)
AST: 24 U/L (ref 15–41)
Albumin: 3.9 g/dL (ref 3.5–5.0)
BILIRUBIN TOTAL: 0.4 mg/dL (ref 0.3–1.2)
BUN: 11 mg/dL (ref 6–20)
CALCIUM: 9.1 mg/dL (ref 8.9–10.3)
CO2: 23 mmol/L (ref 22–32)
CREATININE: 1.04 mg/dL (ref 0.61–1.24)
Chloride: 104 mmol/L (ref 101–111)
Glucose, Bld: 236 mg/dL — ABNORMAL HIGH (ref 65–99)
Potassium: 4.1 mmol/L (ref 3.5–5.1)
SODIUM: 136 mmol/L (ref 135–145)
TOTAL PROTEIN: 6.6 g/dL (ref 6.5–8.1)

## 2015-12-12 LAB — CBC
HEMATOCRIT: 42.8 % (ref 39.0–52.0)
Hemoglobin: 14.8 g/dL (ref 13.0–17.0)
MCH: 30.3 pg (ref 26.0–34.0)
MCHC: 34.6 g/dL (ref 30.0–36.0)
MCV: 87.5 fL (ref 78.0–100.0)
PLATELETS: 176 10*3/uL (ref 150–400)
RBC: 4.89 MIL/uL (ref 4.22–5.81)
RDW: 13.1 % (ref 11.5–15.5)
WBC: 10.4 10*3/uL (ref 4.0–10.5)

## 2015-12-12 LAB — I-STAT CHEM 8, ED
BUN: 16 mg/dL (ref 6–20)
CALCIUM ION: 1.11 mmol/L — AB (ref 1.15–1.40)
CHLORIDE: 104 mmol/L (ref 101–111)
CREATININE: 0.9 mg/dL (ref 0.61–1.24)
GLUCOSE: 230 mg/dL — AB (ref 65–99)
HCT: 44 % (ref 39.0–52.0)
Hemoglobin: 15 g/dL (ref 13.0–17.0)
POTASSIUM: 4.8 mmol/L (ref 3.5–5.1)
Sodium: 137 mmol/L (ref 135–145)
TCO2: 24 mmol/L (ref 0–100)

## 2015-12-12 LAB — I-STAT TROPONIN, ED: TROPONIN I, POC: 0.12 ng/mL — AB (ref 0.00–0.08)

## 2015-12-12 LAB — TROPONIN I: Troponin I: 0.09 ng/mL (ref <0.03)

## 2015-12-12 MED ORDER — ASPIRIN EC 81 MG PO TBEC: 81.0000 mg | DELAYED_RELEASE_TABLET | Freq: Every day | ORAL | Status: DC

## 2015-12-12 MED ORDER — PREDNISONE 5 MG PO TABS
10.0000 mg | ORAL_TABLET | Freq: Every day | ORAL | Status: DC
  Filled 2015-12-12: qty 2

## 2015-12-12 MED ORDER — METOPROLOL TARTRATE 12.5 MG HALF TABLET
12.5000 mg | ORAL_TABLET | Freq: Two times a day (BID) | ORAL | Status: DC
  Administered 2015-12-12 – 2015-12-15 (×6): 12.5 mg via ORAL
  Filled 2015-12-12 (×6): qty 1

## 2015-12-12 MED ORDER — SIMVASTATIN 10 MG PO TABS
10.0000 mg | ORAL_TABLET | Freq: Every day | ORAL | Status: DC
  Administered 2015-12-12: 10 mg via ORAL
  Filled 2015-12-12: qty 1

## 2015-12-12 MED ORDER — DIVALPROEX SODIUM ER 500 MG PO TB24
1000.0000 mg | ORAL_TABLET | Freq: Every day | ORAL | Status: DC
  Administered 2015-12-12 – 2015-12-15 (×4): 1000 mg via ORAL
  Filled 2015-12-12 (×4): qty 2

## 2015-12-12 MED ORDER — OXCARBAZEPINE 300 MG PO TABS
600.0000 mg | ORAL_TABLET | Freq: Two times a day (BID) | ORAL | Status: DC
  Administered 2015-12-12 – 2015-12-15 (×6): 600 mg via ORAL
  Filled 2015-12-12 (×9): qty 2

## 2015-12-12 MED ORDER — PREDNISONE 20 MG PO TABS
20.0000 mg | ORAL_TABLET | Freq: Every day | ORAL | Status: DC
  Administered 2015-12-14: 20 mg via ORAL
  Filled 2015-12-12 (×2): qty 1

## 2015-12-12 MED ORDER — HYDROCODONE-ACETAMINOPHEN 10-325 MG PO TABS
1.0000 | ORAL_TABLET | Freq: Four times a day (QID) | ORAL | Status: DC | PRN
  Administered 2015-12-12 – 2015-12-15 (×10): 1 via ORAL
  Filled 2015-12-12 (×10): qty 1

## 2015-12-12 MED ORDER — DOCUSATE SODIUM 100 MG PO CAPS
200.0000 mg | ORAL_CAPSULE | Freq: Every day | ORAL | Status: DC
  Administered 2015-12-13 – 2015-12-14 (×2): 200 mg via ORAL
  Filled 2015-12-12 (×2): qty 2

## 2015-12-12 MED ORDER — PREDNISONE 20 MG PO TABS
30.0000 mg | ORAL_TABLET | Freq: Every day | ORAL | Status: AC
  Administered 2015-12-13: 30 mg via ORAL
  Filled 2015-12-12: qty 1

## 2015-12-12 MED ORDER — SODIUM CHLORIDE 0.9 % IV BOLUS (SEPSIS)
500.0000 mL | Freq: Once | INTRAVENOUS | Status: AC
  Administered 2015-12-12: 500 mL via INTRAVENOUS

## 2015-12-12 MED ORDER — FENTANYL CITRATE (PF) 100 MCG/2ML IJ SOLN
50.0000 ug | INTRAMUSCULAR | Status: DC | PRN
  Administered 2015-12-12: 50 ug via INTRAVENOUS
  Filled 2015-12-12: qty 2

## 2015-12-12 MED ORDER — HEPARIN (PORCINE) IN NACL 100-0.45 UNIT/ML-% IJ SOLN
1600.0000 [IU]/h | INTRAMUSCULAR | Status: DC
  Administered 2015-12-12: 1400 [IU]/h via INTRAVENOUS
  Administered 2015-12-13 – 2015-12-14 (×3): 1600 [IU]/h via INTRAVENOUS
  Filled 2015-12-12 (×4): qty 250

## 2015-12-12 MED ORDER — PREDNISONE 20 MG PO TABS
20.0000 mg | ORAL_TABLET | Freq: Once | ORAL | Status: AC
  Administered 2015-12-12: 20 mg via ORAL
  Filled 2015-12-12: qty 1

## 2015-12-12 MED ORDER — HEPARIN BOLUS VIA INFUSION
4000.0000 [IU] | Freq: Once | INTRAVENOUS | Status: AC
  Administered 2015-12-12: 4000 [IU] via INTRAVENOUS
  Filled 2015-12-12: qty 4000

## 2015-12-12 MED ORDER — VITAMIN D 1000 UNITS PO TABS
1000.0000 [IU] | ORAL_TABLET | Freq: Every day | ORAL | Status: DC
  Administered 2015-12-13 – 2015-12-14 (×2): 1000 [IU] via ORAL
  Filled 2015-12-12 (×6): qty 1

## 2015-12-12 MED ORDER — FAMOTIDINE 20 MG PO TABS
20.0000 mg | ORAL_TABLET | Freq: Every day | ORAL | Status: DC
  Administered 2015-12-13 – 2015-12-14 (×2): 20 mg via ORAL
  Filled 2015-12-12 (×2): qty 1

## 2015-12-12 MED ORDER — ASPIRIN EC 81 MG PO TBEC
81.0000 mg | DELAYED_RELEASE_TABLET | Freq: Every day | ORAL | Status: DC
  Administered 2015-12-13 – 2015-12-14 (×2): 81 mg via ORAL
  Filled 2015-12-12 (×2): qty 1

## 2015-12-12 MED ORDER — NITROGLYCERIN 0.4 MG SL SUBL
0.4000 mg | SUBLINGUAL_TABLET | SUBLINGUAL | Status: DC | PRN
  Administered 2015-12-13: 0.4 mg via SUBLINGUAL
  Filled 2015-12-12: qty 1

## 2015-12-12 MED ORDER — NITROGLYCERIN 0.4 MG SL SUBL: 0.4000 mg | SUBLINGUAL_TABLET | SUBLINGUAL | Status: DC | PRN

## 2015-12-12 MED ORDER — ACETAMINOPHEN 325 MG PO TABS
650.0000 mg | ORAL_TABLET | ORAL | Status: DC | PRN
  Administered 2015-12-13 – 2015-12-15 (×4): 650 mg via ORAL
  Filled 2015-12-12 (×4): qty 2

## 2015-12-12 MED ORDER — ALBUTEROL SULFATE (2.5 MG/3ML) 0.083% IN NEBU: 3.0000 mL | INHALATION_SOLUTION | Freq: Four times a day (QID) | RESPIRATORY_TRACT | Status: DC | PRN

## 2015-12-12 MED ORDER — ONDANSETRON HCL 4 MG/2ML IJ SOLN: 4.0000 mg | Freq: Four times a day (QID) | INTRAMUSCULAR | Status: DC | PRN

## 2015-12-12 NOTE — ED Notes (Signed)
Dinner tray ordered, heart healthy diet 

## 2015-12-12 NOTE — H&P (Signed)
Patient ID: Peter Barnett MRN: [id number], DOB/AGE: 02-25-1960   Admit date: 12/12/2015   Primary Physician: Peter Sou, MD Primary Cardiologist: Peter Barnett  Pt. Profile:  Peter Barnett is a 55 y.o. male with a history of CAD, COPD, HTN, HLD, right frontal AVM status post right frontal craniotomy and recurrent bleed in 11/16,  GERD and seizure  who presented to [facility] ED by EMS  for evaluation of chest pain.   HPI: As above. hx of CAD status post prior MI and DES to the LCx in 8/09 and staged PCI of LAD with DES and distal RCA with BMS.  LHC in 2011 at [place] with FFR of LAD and LCx without hemodynamically significant lesions. Myoview in 2013 was low risk.  He was doing well on cardiac stand point when last seen by Peter Barnett 11/21/15 for cardiac clearance for lumber spine surgery.  He was cleared with mod risk for CV complications during any non-cardiac surgery regardless of further testing. S/p lumbar decompression 11/26/15. No complications.   He was in [place] up-until this afternoon when he had an onset upper sternal sharp pain while watching TV. The pain radiated to left arm associated with nausea and severe diaphoresis. He took 2 sublingual nitroglycerin without improvement and EMS was called. He was given another nitroglycerin and aspirin 324 mg with minimal improvement. His pain eventually resolved in ER after receiving fentanyl. He states that this is similar to his prior cardiac pain. At that time he had arm pain now has a arm pain and chest pain. Patient denies any recent exertional dyspnea or chest pain. Patient denies palpitation, orthopnea, PND, syncope, melena, blood in his stool or urine, lower extremity edema.  EKG shows sinus rhythm at rate of 73 bpm. No acute changes. Point-of-care troponin 0.12. Electrolytes normal. Elevated blood glucose. His x-ray without acute cardiopulmonary disease.  The patient is currently chest pain-free and started on IV heparin. He  was off aspirin for one week prior to surgery. No interruption since then.  Problem List  Past Medical History:  Diagnosis Date  . CAD (coronary artery disease)    Premature; stents + known distal RCA/PDA occlusion  . Chronic lower back pain    Still with recurrent left sided LBP with paresthesias down left leg --primarily with activities: pain pill use is avg <90 tabs per mo  . COPD (chronic obstructive pulmonary disease) ([place])    NOTED on CT chest 11/2013 done for lung ca screening.  Hosp for acute exac 11/2014.  Spirometry not supportive of this dx as of pulm eval 2017, though.  Spireva trial by Peter Barnett 02/2015.  Marland Kitchen Dysrhythmia    "flutters"  . Exertional dyspnea 10/07/2011  . GERD (gastroesophageal reflux disease)   . History of adenomatous polyp of colon 02/2009;02/2014   Recall 5 yrs ([facility])  . History of kidney stones   . HTN (hypertension)   . Hyperlipidemia   . Intracranial hemorrhage ([place]) 12/27/14   small acute hemorrhage in gliotic medial R frontal lobe  . Memory loss   . MI (myocardial infarction) 2009   "just had arm pain; never any chest pain"  . Migraine syndrome 10/07/2011    a couple per month usually, but worsening 2017 so Dr. Krista Barnett adjusted med  . 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 Pneumonia ~ 2010   "2 years in a row; May both times"  . Seizures ([place])    last yr ago  . Short-term memory  loss 10/07/2011   "post brain OR & from his seizures"--worsening 2017, Dr. Krista Barnett in neurology doing w/u.  Marland Kitchen Stroke ([place]) 12/2014  . Tobacco abuse   . Urethral stricture since 1981   Recurrent dilation has been required [facility] urology)    Past Surgical History:  Procedure Laterality Date  . CARDIAC CATHETERIZATION  2011   [facility], records unavailable: pt reports "normal".  . COLONOSCOPY W/ POLYPECTOMY  02/2009;02/2014   Recall 5 yr (aden poly, hyperplast polyp, int and ext hemorr).  Next recall is 02/2019.  Marland Kitchen CORONARY ANGIOPLASTY WITH STENT PLACEMENT   2009   3 DES to LCx; still with known distal RCA/PDA occlusion.  EF 60% by LV-gram.  . CRANIOTOMY FOR AVM  1981   Dx'd after pt began having seizures.  . CT ANGIOGRAM (CEREBRAL)  12/31/14   NORMAL--plan per neuro is to repeat this in 3 mo.  Pt states it was repeated Spring 2017 and was "fine"  . [place] SURGERY  2008; 2009   Dr. Joya Barnett  . LUMBAR LAMINECTOMY/DECOMPRESSION MICRODISCECTOMY N/A 11/25/2015   Procedure: Lumbar three- four Lumbar four- five Laminectomy/Foraminotomy;  Surgeon: Peter Cha, MD;  Location: [facility];  Service: Neurosurgery;  Laterality: N/A;  L3-4 L4-5 Laminectomy/Foraminotomy/possible Lumbar Drain  . TONSILLECTOMY     "I was little"  . TRANSTHORACIC ECHOCARDIOGRAM  12/07/14   Normal LV size/fxn, EF 60%, normal valves.     Allergies  Allergies  Allergen Reactions  . Tramadol Other (See Comments)    SEIZURES "Staring off spells"  . Atorvastatin Other (See Comments)    MYALGIAS     Home Medications  Prior to Admission medications   Medication Sig Start Date End Date Taking? Authorizing Provider  acetaminophen (TYLENOL) 500 MG tablet Take 1,000 mg by mouth every 6 (six) hours as needed for headache.   Yes Historical Provider, MD  albuterol (PROVENTIL HFA;VENTOLIN HFA) 108 (90 BASE) MCG/ACT inhaler Inhale 2 puffs into the lungs every 6 (six) hours as needed for wheezing or shortness of breath. 12/08/14  Yes Peter Dumas, MD  aspirin EC 81 MG tablet Take 81 mg by mouth daily.   Yes Historical Provider, MD  butalbital-acetaminophen-caffeine (FIORICET, ESGIC) 50-325-40 MG tablet Take 1 tablet by mouth every 6 (six) hours as needed for headache. 06/17/15  Yes Peter Pacas, MD  Cholecalciferol 1000 units tablet Take 1,000 Units by mouth daily.   Yes Historical Provider, MD  divalproex (DEPAKOTE ER) 500 MG 24 hr tablet Take 2 tablets (1,000 mg total) by mouth at bedtime. 06/17/15  Yes Peter Pacas, MD  docusate sodium (COLACE) 100 MG capsule Take 200 mg by mouth daily.     Yes Historical Provider, MD  famotidine (PEPCID) 20 MG tablet Take 20 mg by mouth daily.   Yes Historical Provider, MD  HYDROcodone-acetaminophen (NORCO) 10-325 MG per tablet Take 1 tablet by mouth every 6 (six) hours as needed (pain).  08/07/14  Yes Historical Provider, MD  metoprolol tartrate (LOPRESSOR) 25 MG tablet Take 0.5 tablets (12.5 mg total) by mouth 2 (two) times daily. 01/30/15  Yes Peter Sou, MD  nitroGLYCERIN (NITROSTAT) 0.4 MG SL tablet Place 1 tablet (0.4 mg total) under the tongue every 5 (five) minutes as needed for chest pain (CP or SOB). Patient taking differently: Place 0.4 mg under the tongue every 5 (five) minutes as needed (chest pain or shortness of breath).  12/05/14 02/01/18 Yes Jettie Booze, MD  oxcarbazepine (TRILEPTAL) 600 MG tablet Take 1 tablet (600 mg total) by  mouth 2 (two) times daily. 06/17/15  Yes Peter Pacas, MD  predniSONE (DELTASONE) 10 MG tablet Take 10-60 mg by mouth daily. 12/10/15  Yes Historical Provider, MD  atorvastatin (LIPITOR) 40 MG tablet Take 1 tablet (40 mg total) by mouth daily. Patient not taking: Reported on 12/12/2015 08/21/15   Peter Sou, MD    Family History  Family History  Problem Relation Age of Onset  . Hyperlipidemia Mother   . Cancer Father     Mesothelioma  . Hypertension Father   . Asthma Father   . Heart attack Paternal Uncle     In 64s too  . Stroke Neg Hx    Family Status  Relation Status  . Mother Alive  . Father Deceased at age 35  . Paternal Uncle   . Neg Hx     Social History  Social History   Social History  . Marital status: Married    Spouse name: [name]  . Number of children: 2  . Years of education: 10 th   Occupational History  . retired  Retired    retired   Social History Main Topics  . Smoking status: Former Smoker    Packs/day: 3.00    Years: 27.00    Types: Cigarettes    Quit date: 02/08/2001  . Smokeless tobacco: Never Used  . Alcohol use 4.2 oz/week    7 Cans of  beer per week     Comment: 12 oz beer   . Drug use: No  . Sexual activity: Yes   Other Topics Concern  . Not on file   Social History Narrative   Patient lives at home with his wife [name]).   Two adult children.   Retired from [employer] approx age 58 due to medical reasons (disabled due to memory issues, seizure d/o and chronic LBP).   Education 10th grade.   Right handed.   Caffeine two cups of tea and two cups of coffee.   Tobacco 45 pack-yr hx, quit approx age 77.  Alcohol: 1-2 beers per night.     No hx of alc or drug problems.   No exercise.   Diet: regular     Review of Systems General:  No chills, fever, night sweats or weight changes.  Cardiovascular:  No chest pain, dyspnea on exertion, edema, orthopnea, palpitations, paroxysmal nocturnal dyspnea. Dermatological: No rash, lesions/masses Respiratory: No cough, dyspnea Urologic: No hematuria, dysuria Abdominal:   No nausea, vomiting, diarrhea, bright red blood per rectum, melena, or hematemesis Neurologic:  No visual changes, wkns, changes in mental status. All other systems reviewed and are otherwise negative except as noted above.  Physical Exam  Blood pressure 141/75, pulse 64, temperature 98.4 F (36.9 C), temperature source Oral, resp. rate 14, height 5\' 10"  (1.778 m), weight 254 lb (115.2 kg), SpO2 95 %.  General: Pleasant, NAD Psych: Normal affect. Neuro: Alert and oriented X 3. Moves all extremities spontaneously. HEENT: Normal  Neck: Supple without bruits or JVD. Lungs:  Resp regular and unlabored, CTA. Heart: RRR no s3, s4, or murmurs. Abdomen: Soft, non-tender, non-distended, BS + x 4.  Extremities: No clubbing, cyanosis or edema. DP/PT/Radials 2+ and equal bilaterally.  Labs  No results for input(s): CKTOTAL, CKMB, TROPONINI in the last 72 hours. Lab Results  Component Value Date   WBC 10.4 12/12/2015   HGB 15.0 12/12/2015   HCT 44.0 12/12/2015   MCV 87.5 12/12/2015   PLT 176 12/12/2015      Recent Labs  Lab 12/12/15 1645 12/12/15 1653  NA 136 137  K 4.1 4.8  CL 104 104  CO2 23  --   BUN 11 16  CREATININE 1.04 0.90  CALCIUM 9.1  --   PROT 6.6  --   BILITOT 0.4  --   ALKPHOS 92  --   ALT 33  --   AST 24  --   GLUCOSE 236* 230*   Lab Results  Component Value Date   CHOL 194 08/11/2015   HDL 47 08/11/2015   LDLCALC 120 08/11/2015   TRIG 133 08/11/2015   No results found for: DDIMER   Radiology/Studies  Dg Chest 2 View  Result Date: 12/12/2015 CLINICAL DATA:  Chest pain EXAM: CHEST  2 VIEW COMPARISON:  12/07/2014 FINDINGS: Cardiomediastinal silhouette is stable. No infiltrate or pleural effusion. No pulmonary edema. Mild degenerative changes thoracic spine. IMPRESSION: No active cardiopulmonary disease. Electronically Signed   By: Lahoma Crocker M.D.   On: 12/12/2015 17:12   Dg Lumbar Spine 2-3 Views  Result Date: 11/25/2015 CLINICAL DATA:  L3-4 and L4-5 laminectomy EXAM: LUMBAR SPINE - 2-3 VIEW COMPARISON:  Lumbar radiographs September 05, 2015; lumbar myelogram and lumbar myelogram CT September 17, 2015 FINDINGS: Cross-table lateral lumbar image labeled #1 shows metallic device is posterior to the inferior aspect of the L4 vertebral body and posterior to the S1 vertebral body respectively. No fracture or spondylolisthesis. There is aortic atherosclerosis. Cross-table lateral lumbar image labeled #2 reveals metallic probes with tips posterior to the L3-4, L4-5, and L5-S1 interspaces. Cutting device overlies the L4 spinous process. No fracture or spondylolisthesis. There is aortic atherosclerosis. IMPRESSION: On final submitted cross-table lateral lumbar image, metallic probe tips are posterior to the L3-4, L4-5, L5-S1 interspaces. No evident fracture or spondylolisthesis. There is aortic atherosclerosis. Electronically Signed   By: Lowella Grip III M.D.   On: 11/25/2015 15:22   Echo 11/2014 LV EF: 55% -    60%  ------------------------------------------------------------------- Indications:      Chest pain 786.51.  ------------------------------------------------------------------- History:   PMH:   Dyspnea.  Coronary artery disease.  Chronic obstructive pulmonary disease.  Risk factors:  Hypertension. Dyslipidemia.  ------------------------------------------------------------------- Study Conclusions  - Left ventricle: The cavity size was normal. Systolic function was   normal. The estimated ejection fraction was in the range of 55%   to 60%. Wall motion was normal; there were no regional wall   motion abnormalities. - Aortic valve: Trileaflet; normal thickness, mildly calcified   leaflets. There was trivial regurgitation.    ASSESSMENT AND PLAN  1. NSTEMI - Symptoms concerning for unstable angina. EKG without acute ischemic changes. Point-of-care troponin 0.12. Will admit and continue cycle troponin and serial EKG. Continue IV heparin. We'll plan for cath Monday. Will need cath order.   2. CAD  - s/p MI in 2009 and PCI with DES to LCx and DES to LAD and BMS to RCA.  Last myoview in 2013 was low risk.   3. HTN - Stable. Continue home metoprolol 12.5 mg twice a day.  4. HLD - He was taken off lipitor due to arm pain during last OV. This was resolved. Arm pain this time is difference. PT on simvastatin prior to this. Will resume at low dose.   5. Elevated blood sugar - Will get hemoglobin A1c. Likely due to prednisone taper.    Signed, Leanor Kail, PA-C 12/12/2015, 6:04 PM Pager [phone]

## 2015-12-12 NOTE — Progress Notes (Signed)
Pt arrived to unit from ER.  Telemetry applied and CCMD notified.  Pt oriented to room including call light and telephone.  Pt plan of care discussed with Pt and wife and both indicate understanding.  Pt denies chest pain at this time.  No s/s of distress.

## 2015-12-12 NOTE — ED Provider Notes (Signed)
[facility] DEPT Provider Note   CSN: [id number] Arrival date & time: 12/12/15  1617     History   Chief Complaint Chief Complaint  Patient presents with  . Chest Pain    HPI Peter Barnett is a 55 y.o. male.  Patient with significant coronary artery disease history follows with Dr. Ellwood Sayers presents with chest pain and left arm symptoms since 1:30 today. He came on at rest patient was diaphoretic. This feels similar to his heart attack/stent history. Symptoms improved with nitroglycerin in time down to 210. Patient did have recent surgery probably 4 weeks ago however no shortness of breath no blood clot history no new leg swelling. Last heart attack 2009 and last cath during attempts well.      Past Medical History:  Diagnosis Date  . CAD (coronary artery disease)    Premature; stents + known distal RCA/PDA occlusion  . Chronic lower back pain    Still with recurrent left sided LBP with paresthesias down left leg --primarily with activities: pain pill use is avg <90 tabs per mo  . COPD (chronic obstructive pulmonary disease) ([place])    NOTED on CT chest 11/2013 done for lung ca screening.  Hosp for acute exac 11/2014.  Spirometry not supportive of this dx as of pulm eval 2017, though.  Spireva trial by Dr. Elsworth Soho 02/2015.  Marland Kitchen Dysrhythmia    "flutters"  . Exertional dyspnea 10/07/2011  . GERD (gastroesophageal reflux disease)   . History of adenomatous polyp of colon 02/2009;02/2014   Recall 5 yrs ([facility])  . History of kidney stones   . HTN (hypertension)   . Hyperlipidemia   . Intracranial hemorrhage ([place]) 12/27/14   small acute hemorrhage in gliotic medial R frontal lobe  . Memory loss   . MI (myocardial infarction) 2009   "just had arm pain; never any chest pain"  . Migraine syndrome 10/07/2011    a couple per month usually, but worsening 2017 so Dr. Krista Blue adjusted med  . 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 Pneumonia ~ 2010   "2 years in a row;  May both times"  . Seizures ([place])    last yr ago  . Short-term memory loss 10/07/2011   "post brain OR & from his seizures"--worsening 2017, Dr. Krista Blue in neurology doing w/u.  Marland Kitchen Stroke ([place]) 12/2014  . Tobacco abuse   . Urethral stricture since 1981   Recurrent dilation has been required [facility] urology)    Patient Active Problem List   Diagnosis Date Noted  . Herniated lumbar intervertebral disc 11/27/2015  . Lumbar canal stenosis 11/25/2015  . Left lumbar radiculopathy 06/17/2015  . Migraine 06/17/2015  . Hemorrhage, intracranial ([place]) 01/01/2015  . COPD (chronic obstructive pulmonary disease) ([place]) 12/27/2014  . Intracranial bleeding ([place]) 12/27/2014  . GERD (gastroesophageal reflux disease) 12/27/2014  . Left facial numbness 12/25/2014  . COPD exacerbation ([place]) 12/07/2014  . DOE (dyspnea on exertion) 12/05/2014  . Fever, unspecified 11/29/2014  . Cough 11/28/2014  . Right flank pain 03/28/2014  . Prostate cancer screening 12/05/2013  . 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 Coronary atherosclerosis of native coronary artery 10/25/2013  . HTN (hypertension), benign 08/29/2013  . Hyperlipidemia 08/29/2013  . Venous insufficiency of both lower extremities 08/29/2013  . History of adenomatous polyp of colon 08/29/2013  . Memory loss 10/06/2012  . Seizures ([place]) 10/06/2012  . H/O craniotomy 10/06/2012  . Arm paresthesia, left 10/07/2011  . Left arm pain 10/07/2011  . 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 Colon polyp 01/14/2011  . H/O Spinal surgery 01/14/2011  . Cerebral seizure 01/14/2011  . Arteriosclerosis of coronary artery 01/14/2011  . Breath shortness 03/25/2009    Past Surgical History:  Procedure Laterality Date  . CARDIAC CATHETERIZATION  2011   [facility], records unavailable: pt reports "normal".  . COLONOSCOPY W/ POLYPECTOMY  02/2009;02/2014   Recall 5 yr (aden poly, hyperplast polyp, int and ext hemorr).  Next recall is 02/2019.  Marland Kitchen  CORONARY ANGIOPLASTY WITH STENT PLACEMENT  2009   3 DES to LCx; still with known distal RCA/PDA occlusion.  EF 60% by LV-gram.  . CRANIOTOMY FOR AVM  1981   Dx'd after pt began having seizures.  . CT ANGIOGRAM (CEREBRAL)  12/31/14   NORMAL--plan per neuro is to repeat this in 3 mo.  Pt states it was repeated Spring 2017 and was "fine"  . [place] SURGERY  2008; 2009   Dr. Joya Salm  . LUMBAR LAMINECTOMY/DECOMPRESSION MICRODISCECTOMY N/A 11/25/2015   Procedure: Lumbar three- four Lumbar four- five Laminectomy/Foraminotomy;  Surgeon: Leeroy Cha, MD;  Location: [facility];  Service: Neurosurgery;  Laterality: N/A;  L3-4 L4-5 Laminectomy/Foraminotomy/possible Lumbar Drain  . TONSILLECTOMY     "I was little"  . TRANSTHORACIC ECHOCARDIOGRAM  12/07/14   Normal LV size/fxn, EF 60%, normal valves.       Home Medications    Prior to Admission medications   Medication Sig Start Date End Date Taking? Authorizing Provider  acetaminophen (TYLENOL) 500 MG tablet Take 1,000 mg by mouth every 6 (six) hours as needed for headache.   Yes Historical Provider, MD  albuterol (PROVENTIL HFA;VENTOLIN HFA) 108 (90 BASE) MCG/ACT inhaler Inhale 2 puffs into the lungs every 6 (six) hours as needed for wheezing or shortness of breath. 12/08/14  Yes Reyne Dumas, MD  aspirin EC 81 MG tablet Take 81 mg by mouth daily.   Yes Historical Provider, MD  butalbital-acetaminophen-caffeine (FIORICET, ESGIC) 50-325-40 MG tablet Take 1 tablet by mouth every 6 (six) hours as needed for headache. 06/17/15  Yes Marcial Pacas, MD  Cholecalciferol 1000 units tablet Take 1,000 Units by mouth daily.   Yes Historical Provider, MD  divalproex (DEPAKOTE ER) 500 MG 24 hr tablet Take 2 tablets (1,000 mg total) by mouth at bedtime. 06/17/15  Yes Marcial Pacas, MD  docusate sodium (COLACE) 100 MG capsule Take 200 mg by mouth daily.    Yes Historical Provider, MD  famotidine (PEPCID) 20 MG tablet Take 20 mg by mouth daily.   Yes Historical Provider, MD    HYDROcodone-acetaminophen (NORCO) 10-325 MG per tablet Take 1 tablet by mouth every 6 (six) hours as needed (pain).  08/07/14  Yes Historical Provider, MD  metoprolol tartrate (LOPRESSOR) 25 MG tablet Take 0.5 tablets (12.5 mg total) by mouth 2 (two) times daily. 01/30/15  Yes Tammi Sou, MD  nitroGLYCERIN (NITROSTAT) 0.4 MG SL tablet Place 1 tablet (0.4 mg total) under the tongue every 5 (five) minutes as needed for chest pain (CP or SOB). Patient taking differently: Place 0.4 mg under the tongue every 5 (five) minutes as needed (chest pain or shortness of breath).  12/05/14 02/01/18 Yes Jettie Booze, MD  oxcarbazepine (TRILEPTAL) 600 MG tablet Take 1 tablet (600 mg total) by mouth 2 (two) times daily. 06/17/15  Yes Marcial Pacas, MD  predniSONE (DELTASONE) 10 MG tablet Take 10-60 mg by mouth daily. 12/10/15  Yes Historical Provider, MD  atorvastatin (LIPITOR) 40 MG tablet Take 1 tablet (40 mg  total) by mouth daily. Patient not taking: Reported on 12/12/2015 08/21/15   Tammi Sou, MD    Family History Family History  Problem Relation Age of Onset  . Hyperlipidemia Mother   . Cancer Father     Mesothelioma  . Hypertension Father   . Asthma Father   . Heart attack Paternal Uncle     In 77s too  . Stroke Neg Hx     Social History Social History  Substance Use Topics  . Smoking status: Former Smoker    Packs/day: 3.00    Years: 27.00    Types: Cigarettes    Quit date: 02/08/2001  . Smokeless tobacco: Never Used  . Alcohol use 4.2 oz/week    7 Cans of beer per week     Comment: 12 oz beer      Allergies   Tramadol and Atorvastatin   Review of Systems Review of Systems  Constitutional: Negative for chills and fever.  HENT: Negative for congestion.   Eyes: Negative for visual disturbance.  Respiratory: Negative for shortness of breath.   Cardiovascular: Positive for chest pain. Negative for leg swelling.  Gastrointestinal: Negative for abdominal pain and vomiting.   Genitourinary: Negative for dysuria and flank pain.  Musculoskeletal: Negative for back pain, neck pain and neck stiffness.  Skin: Negative for rash.  Neurological: Negative for light-headedness and headaches.     Physical Exam Updated Vital Signs BP 134/67   Pulse 77   Temp 98.4 F (36.9 C) (Oral)   Resp 14   Ht 5\' 10"  (1.778 m)   Wt 254 lb (115.2 kg)   SpO2 97%   BMI 36.45 kg/m   Physical Exam  Constitutional: He is oriented to person, place, and time. He appears well-developed and well-nourished.  HENT:  Head: Normocephalic and atraumatic.  Eyes: Conjunctivae are normal. Right eye exhibits no discharge. Left eye exhibits no discharge.  Neck: Normal range of motion. Neck supple. No tracheal deviation present.  Cardiovascular: Normal rate, regular rhythm and intact distal pulses.   Pulmonary/Chest: Effort normal and breath sounds normal.  Abdominal: Soft. He exhibits no distension. There is no tenderness. There is no guarding.  Musculoskeletal: He exhibits no edema.  Neurological: He is alert and oriented to person, place, and time.  Skin: Skin is warm. No rash noted.  Psychiatric: He has a normal mood and affect.  Nursing note and vitals reviewed.    ED Treatments / Results  Labs (all labs ordered are listed, but only abnormal results are displayed) Labs Reviewed  COMPREHENSIVE METABOLIC PANEL - Abnormal; Notable for the following:       Result Value   Glucose, Bld 236 (*)    All other components within normal limits  I-STAT CHEM 8, ED - Abnormal; Notable for the following:    Glucose, Bld 230 (*)    Calcium, Ion 1.11 (*)    All other components within normal limits  I-STAT TROPOININ, ED - Abnormal; Notable for the following:    Troponin i, poc 0.12 (*)    All other components within normal limits  CBC  TROPONIN I  HEPARIN LEVEL (UNFRACTIONATED)    EKG  EKG Interpretation  Date/Time:  Friday December 12 2015 16:18:14 EDT Ventricular Rate:  74 PR  Interval:    QRS Duration: 102 QT Interval:  382 QTC Calculation: 424 R Axis:   -25 Text Interpretation:  Sinus rhythm Borderline left axis deviation Confirmed by Maclin Guerrette MD, Oluwadara Gorman [id number]) on 12/12/2015 4:20:27 PM  Radiology Dg Chest 2 View  Result Date: 12/12/2015 CLINICAL DATA:  Chest pain EXAM: CHEST  2 VIEW COMPARISON:  12/07/2014 FINDINGS: Cardiomediastinal silhouette is stable. No infiltrate or pleural effusion. No pulmonary edema. Mild degenerative changes thoracic spine. IMPRESSION: No active cardiopulmonary disease. Electronically Signed   By: Lahoma Crocker M.D.   On: 12/12/2015 17:12    Procedures Procedures (including critical care time)  Medications Ordered in ED Medications  nitroGLYCERIN (NITROSTAT) SL tablet 0.4 mg (not administered)  fentaNYL (SUBLIMAZE) injection 50 mcg (50 mcg Intravenous Given 12/12/15 1725)  sodium chloride 0.9 % bolus 500 mL (not administered)  heparin ADULT infusion 100 units/mL (25000 units/281mL sodium chloride 0.45%) (1,400 Units/hr Intravenous New Bag/Given 12/12/15 1741)  heparin bolus via infusion 4,000 Units (4,000 Units Intravenous Bolus from Bag 12/12/15 1741)     Initial Impression / Assessment and Plan / ED Course  I have reviewed the triage vital signs and the nursing notes.  Pertinent labs & imaging results that were available during my care of the patient were reviewed by me and considered in my medical decision making (see chart for details).  Clinical Course   Patient was significant cardiac history presents with clinical concern for ACS/cardiac origin of visitation. Patient's troponin elevated, mild pain currently. Discussed with cardiology, heparin ordered.  The patients results and plan were reviewed and discussed.   Any x-rays performed were independently reviewed by myself.   Differential diagnosis were considered with the presenting HPI.  Medications  nitroGLYCERIN (NITROSTAT) SL tablet 0.4 mg (not administered)    fentaNYL (SUBLIMAZE) injection 50 mcg (50 mcg Intravenous Given 12/12/15 1725)  sodium chloride 0.9 % bolus 500 mL (not administered)  heparin ADULT infusion 100 units/mL (25000 units/247mL sodium chloride 0.45%) (1,400 Units/hr Intravenous New Bag/Given 12/12/15 1741)  heparin bolus via infusion 4,000 Units (4,000 Units Intravenous Bolus from Bag 12/12/15 1741)    Vitals:   12/12/15 1619 12/12/15 1621  BP:  134/67  Pulse:  77  Resp:  14  Temp:  98.4 F (36.9 C)  TempSrc:  Oral  SpO2:  97%  Weight: 254 lb (115.2 kg)   Height: 5\' 10"  (1.778 m)     Final diagnoses:  NSTEMI (non-ST elevated myocardial infarction) (HCC)  Troponin level elevated    Admission/ observation were discussed with the admitting physician, patient and/or family and they are comfortable with the plan.   Final Clinical Impressions(s) / ED Diagnoses   Final diagnoses:  NSTEMI (non-ST elevated myocardial infarction) ([place])  Troponin level elevated    New Prescriptions New Prescriptions   No medications on file     Elnora Morrison, MD 12/12/15 1747

## 2015-12-12 NOTE — Progress Notes (Signed)
[facility] for heparin  Indication: chest pain/ACS  Allergies  Allergen Reactions  . Tramadol Other (See Comments)    SEIZURES "Staring off spells"  . Atorvastatin Other (See Comments)    MYALGIAS    Patient Measurements: Height: 5\' 10"  (177.8 cm) Weight: 254 lb (115.2 kg) IBW/kg (Calculated) : 73 Heparin Dosing Weight: 98  Vital Signs: Temp: 98.4 F (36.9 C) (11/03 1621) Temp Source: Oral (11/03 1621) BP: 134/67 (11/03 1621) Pulse Rate: 77 (11/03 1621)  Labs:  Recent Labs  12/12/15 1645 12/12/15 1653  HGB 14.8 15.0  HCT 42.8 44.0  PLT 176  --   CREATININE 1.04 0.90    Estimated Creatinine Clearance: 116.5 mL/min (by C-G formula based on SCr of 0.9 mg/dL).   Medical History: Past Medical History:  Diagnosis Date  . CAD (coronary artery disease)    Premature; stents + known distal RCA/PDA occlusion  . Chronic lower back pain    Still with recurrent left sided LBP with paresthesias down left leg --primarily with activities: pain pill use is avg <90 tabs per mo  . COPD (chronic obstructive pulmonary disease) ([place])    NOTED on CT chest 11/2013 done for lung ca screening.  Hosp for acute exac 11/2014.  Spirometry not supportive of this dx as of pulm eval 2017, though.  Spireva trial by Dr. Elsworth Soho 02/2015.  Marland Kitchen Dysrhythmia    "flutters"  . Exertional dyspnea 10/07/2011  . GERD (gastroesophageal reflux disease)   . History of adenomatous polyp of colon 02/2009;02/2014   Recall 5 yrs ([facility])  . History of kidney stones   . HTN (hypertension)   . Hyperlipidemia   . Intracranial hemorrhage ([place]) 12/27/14   small acute hemorrhage in gliotic medial R frontal lobe  . Memory loss   . MI (myocardial infarction) 2009   "just had arm pain; never any chest pain"  . Migraine syndrome 10/07/2011    a couple per month usually, but worsening 2017 so Dr. Krista Blue adjusted med  . 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  Pneumonia ~ 2010   "2 years in a row; May both times"  . Seizures ([place])    last yr ago  . Short-term memory loss 10/07/2011   "post brain OR & from his seizures"--worsening 2017, Dr. Krista Blue in neurology doing w/u.  Marland Kitchen Stroke ([place]) 12/2014  . Tobacco abuse   . Urethral stricture since 1981   Recurrent dilation has been required [facility] urology)    Assessment: 55 yo male admitted with chest pain. Pharmacy to start heparin. No known anticoagulation PTA and CBC stable.    Goal of Therapy:  Heparin level 0.3-0.7 units/ml Monitor platelets by anticoagulation protocol: Yes   Plan:  1. Give 4000 units bolus x 1 2. Start heparin infusion at 1400 units/hr 3. Check anti-Xa level in 6 hours and daily while on heparin 4. Continue to monitor H&H and platelets   Vincenza Hews, PharmD, BCPS 12/12/2015, 5:30 PM Pager: [phone]

## 2015-12-12 NOTE — ED Triage Notes (Signed)
Pt here from home with c/o chest pain while at rest , pt had 324 asa and 3 nitro from ems pain is a 2 /10 now

## 2015-12-12 NOTE — ED Notes (Signed)
Attempted Report 

## 2015-12-13 LAB — BASIC METABOLIC PANEL
ANION GAP: 9 (ref 5–15)
BUN: 10 mg/dL (ref 6–20)
CALCIUM: 9.1 mg/dL (ref 8.9–10.3)
CO2: 22 mmol/L (ref 22–32)
Chloride: 105 mmol/L (ref 101–111)
Creatinine, Ser: 0.8 mg/dL (ref 0.61–1.24)
Glucose, Bld: 152 mg/dL — ABNORMAL HIGH (ref 65–99)
Potassium: 4.4 mmol/L (ref 3.5–5.1)
Sodium: 136 mmol/L (ref 135–145)

## 2015-12-13 LAB — CBC
HCT: 42.6 % (ref 39.0–52.0)
HEMOGLOBIN: 14.4 g/dL (ref 13.0–17.0)
MCH: 29.8 pg (ref 26.0–34.0)
MCHC: 33.8 g/dL (ref 30.0–36.0)
MCV: 88.2 fL (ref 78.0–100.0)
Platelets: 272 10*3/uL (ref 150–400)
RBC: 4.83 MIL/uL (ref 4.22–5.81)
RDW: 13 % (ref 11.5–15.5)
WBC: 12.6 10*3/uL — ABNORMAL HIGH (ref 4.0–10.5)

## 2015-12-13 LAB — TROPONIN I
Troponin I: 7.91 ng/mL (ref <0.03)
Troponin I: 4.79 ng/mL (ref <0.03)
Troponin I: 3.12 ng/mL (ref <0.03)

## 2015-12-13 LAB — LIPID PANEL
CHOL/HDL RATIO: 4.4 ratio
CHOLESTEROL: 244 mg/dL — AB (ref 0–200)
HDL: 56 mg/dL (ref >40)
LDL Cholesterol: 169 mg/dL — ABNORMAL HIGH (ref 0–99)
TRIGLYCERIDES: 94 mg/dL (ref <150)
VLDL: 19 mg/dL (ref 0–40)

## 2015-12-13 LAB — HEPARIN LEVEL (UNFRACTIONATED)
Heparin Unfractionated: 0.24 IU/mL — ABNORMAL LOW (ref 0.30–0.70)
Heparin Unfractionated: 0.46 IU/mL (ref 0.30–0.70)

## 2015-12-13 MED ORDER — NITROGLYCERIN 2 % TD OINT
1.0000 [in_us] | TOPICAL_OINTMENT | Freq: Four times a day (QID) | TRANSDERMAL | Status: DC
  Administered 2015-12-13 – 2015-12-15 (×8): 1 [in_us] via TOPICAL
  Filled 2015-12-13: qty 30

## 2015-12-13 MED ORDER — ROSUVASTATIN CALCIUM 20 MG PO TABS
40.0000 mg | ORAL_TABLET | Freq: Every day | ORAL | Status: DC
  Administered 2015-12-13 – 2015-12-15 (×3): 40 mg via ORAL
  Filled 2015-12-13 (×2): qty 2
  Filled 2015-12-13 (×2): qty 4

## 2015-12-13 NOTE — Progress Notes (Signed)
    Subjective:  Denies CP or dyspnea   Objective:  Vitals:   12/12/15 1915 12/12/15 1930 12/12/15 2008 12/13/15 0421  BP: 141/74 (!) 143/129 138/82 119/80  Pulse: 76 66    Resp: 19 17 16 20   Temp:   98.2 F (36.8 C) 97.7 F (36.5 C)  TempSrc:    Oral  SpO2: 95% 95% 99% 96%  Weight:   247 lb 4.8 oz (112.2 kg)   Height:   5\' 10"  (1.778 m)     Intake/Output from previous day: No intake or output data in the 24 hours ending 12/13/15 1037  Physical Exam: Physical exam: Well-developed well-nourished in no acute distress.  Skin is warm and dry.  HEENT is normal.  Neck is supple. No thyromegaly.  Chest is clear to auscultation with normal expansion.  Cardiovascular exam is regular rate and rhythm.  Abdominal exam nontender or distended. No masses palpated. Extremities show no edema. neuro grossly intact    Lab Results: Basic Metabolic Panel:  Recent Labs  12/12/15 1645 12/12/15 1653 12/13/15 0117  NA 136 137 136  K 4.1 4.8 4.4  CL 104 104 105  CO2 23  --  22  GLUCOSE 236* 230* 152*  BUN 11 16 10   CREATININE 1.04 0.90 0.80  CALCIUM 9.1  --  9.1   CBC:  Recent Labs  12/12/15 1645 12/12/15 1653 12/13/15 0117  WBC 10.4  --  12.6*  HGB 14.8 15.0 14.4  HCT 42.8 44.0 42.6  MCV 87.5  --  88.2  PLT 176  --  272   Cardiac Enzymes:  Recent Labs  12/12/15 1717 12/13/15 0604  TROPONINI 0.09* 7.91*     Assessment/Plan:  55 year old male with past medical history of hypertension, hyperlipidemia, coronary artery disease (s/p CABG), prior intracranial hemorrhage with non-ST elevation myocardial infarction. Note pt had back surgery last month.  1 NSTEMI-continue aspirin, heparin, metoprolol and statin. Add Nitropaste. Patient did have recurrent chest pain last evening that resolved with sublingual nitroglycerin. Plan cardiac catheterization Monday or sooner if he develops unstable symptoms. The risks and benefits including myocardial infarction, CVA and death  discussed and he agrees to proceed.  2 history of intracranial hemorrhage one year ago and previous craniotomy for repair of AV formation. Patient had arteriogram 12/31/2014 that showed no evidence of arteriovenous shunting to suggest residual malformation and no intracranial aneurysm or dissection.  3 hyperlipidemia-given document coronary disease discontinue low-dose Zocor and instead treat with Crestor 40 mg daily.  4 hypertension-blood pressure controlled. Continue present medications.  Kirk Ruths 12/13/2015, 10:37 AM

## 2015-12-13 NOTE — Progress Notes (Signed)
ANTICOAGULATION CONSULT NOTE - Follow Up Consult  Pharmacy Consult for Heparin Indication: chest pain/ACS  Allergies  Allergen Reactions  . Tramadol Other (See Comments)    SEIZURES "Staring off spells"  . Atorvastatin Other (See Comments)    MYALGIAS    Patient Measurements: Height: 5\' 10"  (177.8 cm) Weight: 247 lb 4.8 oz (112.2 kg) IBW/kg (Calculated) : 73 Heparin Dosing Weight: 98kg  Vital Signs: Temp: 97.7 F (36.5 C) (11/04 0421) Temp Source: Oral (11/04 0421) BP: 119/80 (11/04 0421)  Labs:  Recent Labs  12/12/15 1645 12/12/15 1653 12/12/15 1717 12/13/15 0117 12/13/15 0604 12/13/15 1038  HGB 14.8 15.0  --  14.4  --   --   HCT 42.8 44.0  --  42.6  --   --   PLT 176  --   --  272  --   --   HEPARINUNFRC  --   --   --  0.24*  --  0.46  CREATININE 1.04 0.90  --  0.80  --   --   TROPONINI  --   --  0.09*  --  7.91* 4.79*    Estimated Creatinine Clearance: 129.4 mL/min (by C-G formula based on SCr of 0.8 mg/dL).   Medications:  Heparin @ 1600 units/hr  Assessment: 55yom continues on heparin for NSTEMI with plan for cath on Monday. Heparin level is therapeutic at 0.46. CBC stable. No bleeding.  Goal of Therapy:  Heparin level 0.3-0.7 units/ml Monitor platelets by anticoagulation protocol: Yes   Plan:  1) Continue heparin at 1600 units/hr 2) Daily heparin level and CBC  Deboraha Sprang 12/13/2015,12:25 PM

## 2015-12-13 NOTE — Progress Notes (Signed)
ANTICOAGULATION CONSULT NOTE - Follow Up Consult  Pharmacy Consult for heparin Indication: NSTEMI  Labs:  Recent Labs  12/12/15 1645 12/12/15 1653 12/12/15 1717 12/13/15 0117  HGB 14.8 15.0  --  14.4  HCT 42.8 44.0  --  42.6  PLT 176  --   --  272  HEPARINUNFRC  --   --   --  0.24*  CREATININE 1.04 0.90  --   --   TROPONINI  --   --  0.09*  --     Assessment: 55yo male subtherapeutic on heparin with initial dosing for NSTEMI.  Goal of Therapy:  Heparin level 0.3-0.7 units/ml   Plan:  Will increase heparin gtt by 2 units/kg/hr to 1600 units/hr and check level in 6hr.  Wynona Neat, PharmD, BCPS  12/13/2015,3:32 AM

## 2015-12-14 ENCOUNTER — Other Ambulatory Visit: Payer: Self-pay

## 2015-12-14 LAB — CBC
HCT: 39.1 % (ref 39.0–52.0)
HEMOGLOBIN: 13.6 g/dL (ref 13.0–17.0)
MCH: 30.6 pg (ref 26.0–34.0)
MCHC: 34.8 g/dL (ref 30.0–36.0)
MCV: 87.9 fL (ref 78.0–100.0)
Platelets: 217 10*3/uL (ref 150–400)
RBC: 4.45 MIL/uL (ref 4.22–5.81)
RDW: 13.3 % (ref 11.5–15.5)
WBC: 11.4 10*3/uL — ABNORMAL HIGH (ref 4.0–10.5)

## 2015-12-14 LAB — BASIC METABOLIC PANEL
Anion gap: 9 (ref 5–15)
BUN: 14 mg/dL (ref 6–20)
CHLORIDE: 101 mmol/L (ref 101–111)
CO2: 25 mmol/L (ref 22–32)
CREATININE: 0.77 mg/dL (ref 0.61–1.24)
Calcium: 9 mg/dL (ref 8.9–10.3)
GFR calc Af Amer: >60 mL/min (ref >60)
GLUCOSE: 111 mg/dL — AB (ref 65–99)
Potassium: 3.7 mmol/L (ref 3.5–5.1)
SODIUM: 135 mmol/L (ref 135–145)

## 2015-12-14 LAB — PROTIME-INR
INR: 0.98
PROTHROMBIN TIME: 13 s (ref 11.4–15.2)

## 2015-12-14 LAB — HEMOGLOBIN A1C
HEMOGLOBIN A1C: 5.6 % (ref 4.8–5.6)
Mean Plasma Glucose: 114 mg/dL

## 2015-12-14 LAB — HEPARIN LEVEL (UNFRACTIONATED): HEPARIN UNFRACTIONATED: 0.45 [IU]/mL (ref 0.30–0.70)

## 2015-12-14 MED ORDER — SODIUM CHLORIDE 0.9% FLUSH
3.0000 mL | Freq: Two times a day (BID) | INTRAVENOUS | Status: DC
  Administered 2015-12-14: 3 mL via INTRAVENOUS

## 2015-12-14 MED ORDER — SODIUM CHLORIDE 0.9 % WEIGHT BASED INFUSION
1.0000 mL/kg/h | INTRAVENOUS | Status: DC
  Administered 2015-12-15: 1 mL/kg/h via INTRAVENOUS

## 2015-12-14 MED ORDER — SODIUM CHLORIDE 0.9 % IV SOLN: 250.0000 mL | INTRAVENOUS | Status: DC | PRN

## 2015-12-14 MED ORDER — SODIUM CHLORIDE 0.9 % WEIGHT BASED INFUSION
3.0000 mL/kg/h | INTRAVENOUS | Status: DC
  Administered 2015-12-15: 3 mL/kg/h via INTRAVENOUS

## 2015-12-14 MED ORDER — SODIUM CHLORIDE 0.9% FLUSH: 3.0000 mL | INTRAVENOUS | Status: DC | PRN

## 2015-12-14 MED ORDER — ASPIRIN 81 MG PO CHEW
81.0000 mg | CHEWABLE_TABLET | ORAL | Status: AC
  Administered 2015-12-15: 81 mg via ORAL
  Filled 2015-12-14: qty 1

## 2015-12-14 NOTE — Progress Notes (Signed)
    Subjective:  Denies CP or dyspnea   Objective:  Vitals:   12/12/15 2008 12/13/15 0421 12/13/15 2028 12/14/15 0605  BP: 138/82 119/80 122/63 114/73  Pulse:      Resp: 16 20 18 18   Temp: 98.2 F (36.8 C) 97.7 F (36.5 C) 97.6 F (36.4 C) 98 F (36.7 C)  TempSrc:  Oral Oral Oral  SpO2: 99% 96% 96% 97%  Weight: 247 lb 4.8 oz (112.2 kg)     Height: 5\' 10"  (1.778 m)       Intake/Output from previous day:  Intake/Output Summary (Last 24 hours) at 12/14/15 1117 Last data filed at 12/13/15 1749  Gross per 24 hour  Intake              720 ml  Output                0 ml  Net              720 ml    Physical Exam: Physical exam: Well-developed well-nourished in no acute distress.  Skin is warm and dry.  HEENT is normal.  Neck is supple.  Chest is clear to auscultation with normal expansion.  Cardiovascular exam is regular rate and rhythm.  Abdominal exam nontender or distended. No masses palpated. Extremities show no edema. neuro grossly intact    Lab Results: Basic Metabolic Panel:  Recent Labs  12/13/15 0117 12/14/15 0135  NA 136 135  K 4.4 3.7  CL 105 101  CO2 22 25  GLUCOSE 152* 111*  BUN 10 14  CREATININE 0.80 0.77  CALCIUM 9.1 9.0   CBC:  Recent Labs  12/13/15 0117 12/14/15 0135  WBC 12.6* 11.4*  HGB 14.4 13.6  HCT 42.6 39.1  MCV 88.2 87.9  PLT 272 217   Cardiac Enzymes:  Recent Labs  12/13/15 0604 12/13/15 1038 12/13/15 1644  TROPONINI 7.91* 4.79* 3.12*     Assessment/Plan:  55 year old male with past medical history of hypertension, hyperlipidemia, coronary artery disease (s/p CABG), prior intracranial hemorrhage with non-ST elevation myocardial infarction. Note pt had back surgery last month.  1 NSTEMI-continue aspirin, heparin, metoprolol and statin. Continue Nitropaste. Plan cardiac catheterization Monday or sooner if he develops unstable symptoms. The risks and benefits including myocardial infarction, CVA and death discussed  and he agrees to proceed.  2 history of intracranial hemorrhage one year ago and previous craniotomy for repair of AV formation. Patient had arteriogram 12/31/2014 that showed no evidence of arteriovenous shunting to suggest residual malformation and no intracranial aneurysm or dissection.  3 hyperlipidemia-continue Crestor 40 mg daily.  4 hypertension-blood pressure controlled. Continue present medications.  Kirk Ruths 12/14/2015, 11:17 AM

## 2015-12-14 NOTE — Progress Notes (Signed)
ANTICOAGULATION CONSULT NOTE - Follow Up Consult  Pharmacy Consult for Heparin Indication: chest pain/ACS  Allergies  Allergen Reactions  . Tramadol Other (See Comments)    SEIZURES "Staring off spells"  . Atorvastatin Other (See Comments)    MYALGIAS    Patient Measurements: Height: 5\' 10"  (177.8 cm) Weight: 247 lb 4.8 oz (112.2 kg) IBW/kg (Calculated) : 73 Heparin Dosing Weight: 98kg  Vital Signs: Temp: 98 F (36.7 C) (11/05 0605) Temp Source: Oral (11/05 0605) BP: 114/73 (11/05 0605)  Labs:  Recent Labs  12/12/15 1645 12/12/15 1653  12/13/15 0117 12/13/15 0604 12/13/15 1038 12/13/15 1644 12/14/15 0134 12/14/15 0135  HGB 14.8 15.0  --  14.4  --   --   --   --  13.6  HCT 42.8 44.0  --  42.6  --   --   --   --  39.1  PLT 176  --   --  272  --   --   --   --  217  HEPARINUNFRC  --   --   --  0.24*  --  0.46  --  0.45  --   CREATININE 1.04 0.90  --  0.80  --   --   --   --  0.77  TROPONINI  --   --   < >  --  7.91* 4.79* 3.12*  --   --   < > = values in this interval not displayed.  Estimated Creatinine Clearance: 129.4 mL/min (by C-G formula based on SCr of 0.77 mg/dL).   Medications:  Heparin @ 1600 units/hr  Assessment: 55yom continues on heparin for NSTEMI with plan for cath on Monday. Heparin level is therapeutic at 0.45. CBC stable. No bleeding.  Goal of Therapy:  Heparin level 0.3-0.7 units/ml Monitor platelets by anticoagulation protocol: Yes   Plan:  1) Continue heparin at 1600 units/hr 2) Daily heparin level and CBC  Deboraha Sprang 12/14/2015,10:50 AM

## 2015-12-15 ENCOUNTER — Encounter (HOSPITAL_COMMUNITY)
Admission: EM | Disposition: A | Discharge status: Home / Self Care | Payer: Self-pay | Source: Home / Self Care | Attending: Cardiovascular Disease

## 2015-12-15 ENCOUNTER — Encounter (HOSPITAL_COMMUNITY): Payer: Self-pay | Admitting: Cardiology

## 2015-12-15 DIAGNOSIS — I2510 Atherosclerotic heart disease of native coronary artery without angina pectoris: Secondary | ICD-10-CM

## 2015-12-15 HISTORY — PX: CARDIAC CATHETERIZATION: SHX172

## 2015-12-15 LAB — POCT ACTIVATED CLOTTING TIME
ACTIVATED CLOTTING TIME: 307 s
Activated Clotting Time: 252 seconds

## 2015-12-15 LAB — HEPARIN LEVEL (UNFRACTIONATED): Heparin Unfractionated: 0.35 IU/mL (ref 0.30–0.70)

## 2015-12-15 SURGERY — LEFT HEART CATH AND CORONARY ANGIOGRAPHY
Anesthesia: LOCAL

## 2015-12-15 MED ORDER — HEPARIN SODIUM (PORCINE) 1000 UNIT/ML IJ SOLN
INTRAMUSCULAR | Status: AC
  Filled 2015-12-15: qty 1

## 2015-12-15 MED ORDER — IOPAMIDOL (ISOVUE-370) INJECTION 76%
INTRAVENOUS | Status: AC
  Filled 2015-12-15: qty 100

## 2015-12-15 MED ORDER — TICAGRELOR 90 MG PO TABS
90.0000 mg | ORAL_TABLET | Freq: Two times a day (BID) | ORAL | Status: DC
  Administered 2015-12-15 – 2015-12-16 (×2): 90 mg via ORAL
  Filled 2015-12-15 (×2): qty 1

## 2015-12-15 MED ORDER — LIDOCAINE HCL (PF) 1 % IJ SOLN
INTRAMUSCULAR | Status: DC | PRN
  Administered 2015-12-15: 3 mL via SUBCUTANEOUS

## 2015-12-15 MED ORDER — SODIUM CHLORIDE 0.9% FLUSH
3.0000 mL | Freq: Two times a day (BID) | INTRAVENOUS | Status: DC
  Administered 2015-12-15: 23:00:00 3 mL via INTRAVENOUS

## 2015-12-15 MED ORDER — HEART ATTACK BOUNCING BOOK
Freq: Once | Status: AC
  Administered 2015-12-15: 23:00:00
  Filled 2015-12-15: qty 1

## 2015-12-15 MED ORDER — MIDAZOLAM HCL 2 MG/2ML IJ SOLN
INTRAMUSCULAR | Status: AC
  Filled 2015-12-15: qty 2

## 2015-12-15 MED ORDER — TICAGRELOR 90 MG PO TABS
ORAL_TABLET | ORAL | Status: DC | PRN
  Administered 2015-12-15: 180 mg via ORAL

## 2015-12-15 MED ORDER — FENTANYL CITRATE (PF) 100 MCG/2ML IJ SOLN
INTRAMUSCULAR | Status: AC
  Filled 2015-12-15: qty 2

## 2015-12-15 MED ORDER — VERAPAMIL HCL 2.5 MG/ML IV SOLN
INTRAVENOUS | Status: AC
Start: 2015-12-15 — End: 2015-12-15
  Filled 2015-12-15: qty 2

## 2015-12-15 MED ORDER — FENTANYL CITRATE (PF) 100 MCG/2ML IJ SOLN
INTRAMUSCULAR | Status: DC | PRN
  Administered 2015-12-15 (×3): 25 ug via INTRAVENOUS

## 2015-12-15 MED ORDER — HEPARIN (PORCINE) IN NACL 2-0.9 UNIT/ML-% IJ SOLN
INTRAMUSCULAR | Status: DC | PRN
  Administered 2015-12-15: 1000 mL

## 2015-12-15 MED ORDER — LIDOCAINE HCL (PF) 1 % IJ SOLN
INTRAMUSCULAR | Status: AC
  Filled 2015-12-15: qty 30

## 2015-12-15 MED ORDER — ANGIOPLASTY BOOK
Freq: Once | Status: AC
  Administered 2015-12-15: 22:00:00
  Filled 2015-12-15: qty 1

## 2015-12-15 MED ORDER — ISOSORBIDE MONONITRATE ER 30 MG PO TB24
30.0000 mg | ORAL_TABLET | Freq: Every day | ORAL | Status: DC
  Administered 2015-12-16: 30 mg via ORAL
  Filled 2015-12-15 (×2): qty 1

## 2015-12-15 MED ORDER — IOPAMIDOL (ISOVUE-370) INJECTION 76%
INTRAVENOUS | Status: AC
  Filled 2015-12-15: qty 50

## 2015-12-15 MED ORDER — SODIUM CHLORIDE 0.9% FLUSH: 3.0000 mL | INTRAVENOUS | Status: DC | PRN

## 2015-12-15 MED ORDER — IOPAMIDOL (ISOVUE-370) INJECTION 76%
INTRAVENOUS | Status: DC | PRN
  Administered 2015-12-15: 210 mL via INTRA_ARTERIAL

## 2015-12-15 MED ORDER — SODIUM CHLORIDE 0.9 % WEIGHT BASED INFUSION: 1.0000 mL/kg/h | INTRAVENOUS | Status: AC

## 2015-12-15 MED ORDER — HEPARIN (PORCINE) IN NACL 2-0.9 UNIT/ML-% IJ SOLN
INTRAMUSCULAR | Status: AC
  Filled 2015-12-15: qty 1000

## 2015-12-15 MED ORDER — SODIUM CHLORIDE 0.9 % IV SOLN: 250.0000 mL | INTRAVENOUS | Status: DC | PRN

## 2015-12-15 MED ORDER — HEPARIN SODIUM (PORCINE) 1000 UNIT/ML IJ SOLN
INTRAMUSCULAR | Status: DC | PRN
  Administered 2015-12-15: 2000 [IU] via INTRAVENOUS
  Administered 2015-12-15: 4000 [IU] via INTRAVENOUS
  Administered 2015-12-15: 5500 [IU] via INTRAVENOUS

## 2015-12-15 MED ORDER — HYDROCODONE-ACETAMINOPHEN 10-325 MG PO TABS
1.0000 | ORAL_TABLET | ORAL | Status: DC | PRN
  Administered 2015-12-15 (×2): 1 via ORAL
  Filled 2015-12-15 (×2): qty 1

## 2015-12-15 MED ORDER — NITROGLYCERIN 1 MG/10 ML FOR IR/CATH LAB
INTRA_ARTERIAL | Status: AC
  Filled 2015-12-15: qty 10

## 2015-12-15 MED ORDER — MIDAZOLAM HCL 2 MG/2ML IJ SOLN
INTRAMUSCULAR | Status: DC | PRN
  Administered 2015-12-15 (×3): 1 mg via INTRAVENOUS

## 2015-12-15 MED ORDER — NITROGLYCERIN 1 MG/10 ML FOR IR/CATH LAB
INTRA_ARTERIAL | Status: DC | PRN
  Administered 2015-12-15 (×3): 200 ug via INTRACORONARY

## 2015-12-15 SURGICAL SUPPLY — 20 items
BALLN EUPHORA RX 2.25X12 (BALLOONS) ×2
BALLN MOZEC 2.0X12 (BALLOONS) ×2
BALLOON EUPHORA RX 2.25X12 (BALLOONS) ×1 IMPLANT
BALLOON MOZEC 2.0X12 (BALLOONS) ×1 IMPLANT
CATH 5FR JL3.5 JR4 ANG PIG MP (CATHETERS) ×2 IMPLANT
CATH VISTA GUIDE 6FR XBLAD3.5 (CATHETERS) ×2 IMPLANT
DEVICE RAD COMP TR BAND LRG (VASCULAR PRODUCTS) ×2 IMPLANT
GLIDESHEATH SLEND SS 6F .021 (SHEATH) ×2 IMPLANT
KIT ENCORE 26 ADVANTAGE (KITS) ×2 IMPLANT
KIT HEART LEFT (KITS) ×2 IMPLANT
PACK CARDIAC CATHETERIZATION (CUSTOM PROCEDURE TRAY) ×2 IMPLANT
STENT SYNERGY DES 2.25X12 (Permanent Stent) ×2 IMPLANT
SYR MEDRAD MARK V 150ML (SYRINGE) ×2 IMPLANT
TRANSDUCER W/STOPCOCK (MISCELLANEOUS) ×2 IMPLANT
TUBING CIL FLEX 10 FLL-RA (TUBING) ×2 IMPLANT
WIRE ASAHI PROWATER 180CM (WIRE) ×2 IMPLANT
WIRE ASAHI PROWATER 300CM (WIRE) ×2 IMPLANT
WIRE EMERALD 3MM-J .035X260CM (WIRE) ×2 IMPLANT
WIRE HI TORQ VERSACORE-J 145CM (WIRE) ×2 IMPLANT
WIRE PT2 LS 185 (WIRE) ×2 IMPLANT

## 2015-12-15 NOTE — Progress Notes (Signed)
    Brilinta 90mg  twice a day is covered no copay and no proir auth required

## 2015-12-15 NOTE — H&P (View-Only) (Signed)
    Subjective:  Denies CP or dyspnea   Objective:  Vitals:   12/12/15 2008 12/13/15 0421 12/13/15 2028 12/14/15 0605  BP: 138/82 119/80 122/63 114/73  Pulse:      Resp: 16 20 18 18   Temp: 98.2 F (36.8 C) 97.7 F (36.5 C) 97.6 F (36.4 C) 98 F (36.7 C)  TempSrc:  Oral Oral Oral  SpO2: 99% 96% 96% 97%  Weight: 247 lb 4.8 oz (112.2 kg)     Height: 5\' 10"  (1.778 m)       Intake/Output from previous day:  Intake/Output Summary (Last 24 hours) at 12/14/15 1117 Last data filed at 12/13/15 1749  Gross per 24 hour  Intake              720 ml  Output                0 ml  Net              720 ml    Physical Exam: Physical exam: Well-developed well-nourished in no acute distress.  Skin is warm and dry.  HEENT is normal.  Neck is supple.  Chest is clear to auscultation with normal expansion.  Cardiovascular exam is regular rate and rhythm.  Abdominal exam nontender or distended. No masses palpated. Extremities show no edema. neuro grossly intact    Lab Results: Basic Metabolic Panel:  Recent Labs  12/13/15 0117 12/14/15 0135  NA 136 135  K 4.4 3.7  CL 105 101  CO2 22 25  GLUCOSE 152* 111*  BUN 10 14  CREATININE 0.80 0.77  CALCIUM 9.1 9.0   CBC:  Recent Labs  12/13/15 0117 12/14/15 0135  WBC 12.6* 11.4*  HGB 14.4 13.6  HCT 42.6 39.1  MCV 88.2 87.9  PLT 272 217   Cardiac Enzymes:  Recent Labs  12/13/15 0604 12/13/15 1038 12/13/15 1644  TROPONINI 7.91* 4.79* 3.12*     Assessment/Plan:  55 year old male with past medical history of hypertension, hyperlipidemia, coronary artery disease (s/p CABG), prior intracranial hemorrhage with non-ST elevation myocardial infarction. Note pt had back surgery last month.  1 NSTEMI-continue aspirin, heparin, metoprolol and statin. Continue Nitropaste. Plan cardiac catheterization Monday or sooner if he develops unstable symptoms. The risks and benefits including myocardial infarction, CVA and death discussed  and he agrees to proceed.  2 history of intracranial hemorrhage one year ago and previous craniotomy for repair of AV formation. Patient had arteriogram 12/31/2014 that showed no evidence of arteriovenous shunting to suggest residual malformation and no intracranial aneurysm or dissection.  3 hyperlipidemia-continue Crestor 40 mg daily.  4 hypertension-blood pressure controlled. Continue present medications.  Kirk Ruths 12/14/2015, 11:17 AM

## 2015-12-15 NOTE — Progress Notes (Signed)
TR BAND REMOVAL  LOCATION:    right radial  DEFLATED PER PROTOCOL:    Yes.    TIME BAND OFF / DRESSING APPLIED:    1400   SITE UPON ARRIVAL:    Level 0  SITE AFTER BAND REMOVAL:    Level 0  CIRCULATION SENSATION AND MOVEMENT:    Within Normal Limits   Yes.    COMMENTS:   Checked frequently throughout remainder of shift with no change in assessment, dressing dry and intact

## 2015-12-15 NOTE — Care Management Note (Addendum)
Case Management Note  Patient Details  Name: JHONATAN TAING MRN: [id number] Date of Birth: November 11, 1960  Subjective/Objective:   S/p coronary stent intervention, NCM awaiting benefit check for brilinta.   Patient co pay is 0$ and no prior auth needed.  Patient is indep pta, lives with wife, has a pcp, medication coverage and transportation at dc.  Wife concern about patient had a brain bleed before and wondering if he should take brilinta, Mickel Baas RN will speak to MD concerning this.  Patient goes to [facility] [phone] and they only have the brilinta 60mg  in stock she will need to have the script in before 6 pm in order to order it and have by tomorrow.  NCM will cont to follow for dc needs.               Action/Plan:   Expected Discharge Date:                  Expected Discharge Plan:  Home/Self Care  In-House Referral:     Discharge planning Services  CM Consult  Post Acute Care Choice:    Choice offered to:     DME Arranged:    DME Agency:     HH Arranged:    HH Agency:     Status of Service:  In process, will continue to follow  If discussed at Long Length of Stay Meetings, dates discussed:    Additional Comments:  Zenon Mayo, RN 12/15/2015, 11:21 AM

## 2015-12-15 NOTE — Progress Notes (Signed)
Received into room 6c05 per bed, c/o back pain 7/10, goal 4-5.  Raised HOB and knees for comfort. Text-paged Brittainy PA to change Vicodin order per Q4h as pt takes at home.  POC reviewed w/ pt and family, questions answered.  Written radial cath instructions given and discussed.

## 2015-12-15 NOTE — Progress Notes (Signed)
The patient is scheduled for cardiac catheterization today. This schedule suggests that it will be in the later part of the day.  Daryel November, MD

## 2015-12-15 NOTE — Interval H&P Note (Signed)
History and Physical Interval Note:  12/15/2015 7:35 AM  Peter Barnett  has presented today for surgery, with the diagnosis of NSTEMI  The various methods of treatment have been discussed with the patient and family. After consideration of risks, benefits and other options for treatment, the patient has consented to  Procedure(s): Left Heart Cath and Coronary Angiography (N/A) as a surgical intervention .  The patient's history has been reviewed, patient examined, no change in status, stable for surgery.  I have reviewed the patient's chart and labs.  Questions were answered to the patient's satisfaction.    Cath Lab Visit (complete for each Cath Lab visit)  Clinical Evaluation Leading to the Procedure:   ACS: Yes.    Non-ACS:    Anginal Classification: CCS IV  Anti-ischemic medical therapy: Minimal Therapy (1 class of medications)  Non-Invasive Test Results: No non-invasive testing performed  Prior CABG: No previous CABG       [name] 12/15/2015 7:36 AM

## 2015-12-16 ENCOUNTER — Encounter (HOSPITAL_COMMUNITY): Payer: Self-pay | Admitting: Cardiology

## 2015-12-16 ENCOUNTER — Other Ambulatory Visit: Payer: Self-pay | Admitting: *Deleted

## 2015-12-16 ENCOUNTER — Telehealth: Payer: Self-pay | Admitting: Physician Assistant

## 2015-12-16 LAB — CBC
HCT: 40.8 % (ref 39.0–52.0)
Hemoglobin: 13.6 g/dL (ref 13.0–17.0)
MCH: 29.5 pg (ref 26.0–34.0)
MCHC: 33.3 g/dL (ref 30.0–36.0)
MCV: 88.5 fL (ref 78.0–100.0)
PLATELETS: 188 10*3/uL (ref 150–400)
RBC: 4.61 MIL/uL (ref 4.22–5.81)
RDW: 13.3 % (ref 11.5–15.5)
WBC: 9.1 10*3/uL (ref 4.0–10.5)

## 2015-12-16 LAB — BASIC METABOLIC PANEL
Anion gap: 12 (ref 5–15)
BUN: 9 mg/dL (ref 6–20)
CALCIUM: 9.1 mg/dL (ref 8.9–10.3)
CHLORIDE: 104 mmol/L (ref 101–111)
CO2: 23 mmol/L (ref 22–32)
CREATININE: 0.83 mg/dL (ref 0.61–1.24)
GFR calc non Af Amer: >60 mL/min (ref >60)
GLUCOSE: 84 mg/dL (ref 65–99)
Potassium: 4.1 mmol/L (ref 3.5–5.1)
Sodium: 139 mmol/L (ref 135–145)

## 2015-12-16 MED ORDER — ISOSORBIDE MONONITRATE ER 30 MG PO TB24: 30.0000 mg | ORAL_TABLET | Freq: Every day | ORAL | 6 refills | Status: DC

## 2015-12-16 MED ORDER — ACETAMINOPHEN 500 MG PO TABS: 500.0000 mg | ORAL_TABLET | Freq: Four times a day (QID) | ORAL | 0 refills | Status: AC | PRN

## 2015-12-16 MED ORDER — TICAGRELOR 90 MG PO TABS: 90.0000 mg | ORAL_TABLET | Freq: Two times a day (BID) | ORAL | 10 refills | Status: DC

## 2015-12-16 MED ORDER — TICAGRELOR 90 MG PO TABS: 90.0000 mg | ORAL_TABLET | Freq: Two times a day (BID) | ORAL | 0 refills | Status: DC

## 2015-12-16 MED ORDER — NITROGLYCERIN 0.4 MG SL SUBL: 0.4000 mg | SUBLINGUAL_TABLET | SUBLINGUAL | 3 refills | Status: AC | PRN

## 2015-12-16 MED ORDER — ROSUVASTATIN CALCIUM 40 MG PO TABS: 40.0000 mg | ORAL_TABLET | Freq: Every day | ORAL | 6 refills | Status: DC

## 2015-12-16 NOTE — Care Management Important Message (Signed)
Important Message  Patient Details  Name: Peter Barnett MRN: [id number] Date of Birth: [birth year]   Medicare Important Message Given:  Yes    Felis Quillin 12/16/2015, 2:22 PM

## 2015-12-16 NOTE — Progress Notes (Signed)
Patient Name: Peter Barnett Date of Encounter: 12/16/2015  Primary Cardiologist: Dr. [facility] Problem List     Active Problems:   NSTEMI (non-ST elevated myocardial infarction) ([place])     Subjective   Feels well this morning. Waiting to walk with cardiac rehab.   Inpatient Medications    Scheduled Meds: . aspirin EC  81 mg Oral Daily  . cholecalciferol  1,000 Units Oral Daily  . divalproex  1,000 mg Oral QHS  . docusate sodium  200 mg Oral Daily  . famotidine  20 mg Oral Daily  . isosorbide mononitrate  30 mg Oral Daily  . metoprolol tartrate  12.5 mg Oral BID  . oxcarbazepine  600 mg Oral BID  . predniSONE  10 mg Oral Q breakfast  . predniSONE  20 mg Oral Q breakfast  . rosuvastatin  40 mg Oral q1800  . sodium chloride flush  3 mL Intravenous Q12H  . ticagrelor  90 mg Oral BID   Continuous Infusions:  PRN Meds: sodium chloride, acetaminophen, albuterol, fentaNYL (SUBLIMAZE) injection, HYDROcodone-acetaminophen, nitroGLYCERIN, ondansetron (ZOFRAN) IV, sodium chloride flush   Vital Signs    Vitals:   12/15/15 1200 12/15/15 2045 12/15/15 2241 12/16/15 0526  BP: 128/73 130/66 138/70 (!) 144/84  Pulse: 66 66 73 (!) 59  Resp: 17 17  13   Temp: 98 F (36.7 C) 98 F (36.7 C)  97.5 F (36.4 C)  TempSrc: Oral Oral    SpO2: 99% 96%  97%  Weight:    246 lb 0.5 oz (111.6 kg)  Height:        Intake/Output Summary (Last 24 hours) at 12/16/15 [id number] Last data filed at 12/15/15 1700  Gross per 24 hour  Intake           982.77 ml  Output             2400 ml  Net         -1417.23 ml   Filed Weights   12/12/15 2008 12/14/15 1908 12/16/15 0526  Weight: 247 lb 4.8 oz (112.2 kg) 247 lb 12.8 oz (112.4 kg) 246 lb 0.5 oz (111.6 kg)    Physical Exam    GEN: Well nourished, well developed, in no acute distress.  HEENT: Grossly normal.  Neck: Supple, no JVD, carotid bruits, or masses. Cardiac: RRR, no murmurs, rubs, or gallops. No clubbing, cyanosis, edema.   Radials/DP/PT 2+ and equal bilaterally.  Respiratory:  Respirations regular and unlabored, clear to auscultation bilaterally. GI: Soft, nontender, nondistended, BS + x 4. MS: no deformity or atrophy. Skin: warm and dry, no rash. Right radial cath site stable without hematoma.  Neuro:  Strength and sensation are intact. Psych: AAOx3.  Normal affect.  Labs    CBC  Recent Labs  12/14/15 0135 12/16/15 0502  WBC 11.4* 9.1  HGB 13.6 13.6  HCT 39.1 40.8  MCV 87.9 88.5  PLT 217 0000000   Basic Metabolic Panel  Recent Labs  12/14/15 0135 12/16/15 0502  NA 135 139  K 3.7 4.1  CL 101 104  CO2 25 23  GLUCOSE 111* 84  BUN 14 9  CREATININE 0.77 0.83  CALCIUM 9.0 9.1   Liver Function Tests No results for input(s): AST, ALT, ALKPHOS, BILITOT, PROT, ALBUMIN in the last 72 hours. No results for input(s): LIPASE, AMYLASE in the last 72 hours. Cardiac Enzymes  Recent Labs  12/13/15 1038 12/13/15 1644  TROPONINI 4.79* 3.12*   BNP Invalid input(s): POCBNP D-Dimer No  results for input(s): DDIMER in the last 72 hours. Hemoglobin A1C No results for input(s): HGBA1C in the last 72 hours. Fasting Lipid Panel No results for input(s): CHOL, HDL, LDLCALC, TRIG, CHOLHDL, LDLDIRECT in the last 72 hours. Thyroid Function Tests No results for input(s): TSH, T4TOTAL, T3FREE, THYROIDAB in the last 72 hours.  Invalid input(s): FREET3  Telemetry    SR - Personally Reviewed  ECG    SR - Personally Reviewed  Radiology    No results found.  Cardiac Studies   LHC: 12/15/15  Conclusion     The left ventricular systolic function is normal.  LV end diastolic pressure is mildly elevated.  The left ventricular ejection fraction is 55-65% by visual estimate.  Prox RCA lesion, 30 %stenosed.  Mid RCA lesion, 60 %stenosed.  Mid RCA to Dist RCA lesion, 60 %stenosed.  Dist RCA lesion, 99 %stenosed.  Ost LAD lesion, 0 %stenosed.  Mid Cx lesion, 0 %stenosed.  Ost 2nd Mrg to  2nd Mrg lesion, 40 %stenosed.  A STENT SYNERGY DES 2.25X12 drug eluting stent was successfully placed.  1st Diag lesion, 95 %stenosed.  Post intervention, there is a 0% residual stenosis.   1. 2 vessel obstructive CAD    - 95% first diagonal    - 99% distal RCA. This is just distal to prior stent and appears chronic with left to right collaterals. The vessel is very small in this area and involves with PLOM and PDA bifurcation 2. Continued patency of stents in the proximal LAD and mid LCx. 3. Normal LV function with mildly elevated LVEDP 4. Successful stenting of the first diagonal with DES.  Plan: DAPT for one year. Medical management for residual disease in RCA. If patient has refractory angina we could attempt repeat PCI of this lesion but long term patency would be poor due to vessel size and diffuse disease.     Patient Profile     55 year old male with past medical history of hypertension, hyperlipidemia, coronary artery disease (s/p CABG), prior intracranial hemorrhage with non-ST elevation myocardial infarction. Note pt had back surgery last month.  Assessment & Plan    1. NSTEMI: Underwent LHC yesterday with Dr. Martin[place] showing 2 vessel obstructive disease, 95% to first diag, and 99% to distal RCA. Received a DES to the first diag, with noted continued patency of stents in the prox LAD and mid LCx with normal LV function and mildly elevated LVEDP.  -- Plan for DAPT with ASA and Brilinta for one year, with medical management for disease noted in RCA. Morning labs are stable.   2. History of intracranial hemorrhage: one year ago and previous craniotomy for repair of AV formation. Patient had arteriogram 12/31/2014 that showed no evidence of arteriovenous shunting to suggest residual malformation and no intracranial aneurysm or dissection.  3. Hyperlipidemia: currently on Crestor 40 mg daily.  4. Hypertension: blood pressure controlled. Continue present  medications.  Signed, Reino Bellis, NP  12/16/2015, 7:12 AM  Patient seen and examined. I agree with the assessment and plan as detailed above. See also my additional thoughts below.   The patient is stable today and ready for discharge. I agree with the noted above and the plans as outlined. I spoke with the patient and his wife.  Dola Argyle, MD, [facility] 12/16/2015 7:52 AM

## 2015-12-16 NOTE — Progress Notes (Signed)
CARDIAC REHAB PHASE I   PRE:  Rate/Rhythm: 56 SR  BP:  Sitting: 142/67        SaO2:  97 RA  MODE:  Ambulation: 800 ft   POST:  Rate/Rhythm: 77 SR  BP:  Sitting: 162/75         SaO2: 99 RA  Pt ambulated 800 ft on RA, handheld assist, steady gait, tolerated well with no complaints. Completed MI/stent education with pt and wife at bedside.  Reviewed risk factors, MI book, anti-platelet therapy, stent card, activity restrictions, ntg, exercise, heart healthy diet, portion control, and phase 2 cardiac rehab. Pt verbalized understanding, receptive to education. Pt agrees to phase 2 cardiac rehab referral, will send to [facility] per pt request. Pt up ad lib in room, awaiting discharge.   [id number] Lenna Sciara, RN, BSN 12/16/2015 8:43 AM

## 2015-12-16 NOTE — Telephone Encounter (Signed)
New message      TCM appt on 12-25-15 with Melina Copa per Ria Comment

## 2015-12-16 NOTE — Discharge Summary (Signed)
Discharge Summary    Patient ID: Peter Barnett,  MRN: [id number], DOB/AGE: 1960/06/05 55 y.o.  Admit date: 12/12/2015 Discharge date: 12/16/2015  Primary Care Provider: MCGOWEN,PHILIP H Primary Cardiologist: Dr. Irish Lack  Discharge Diagnoses    Active Problems:   NSTEMI (non-ST elevated myocardial infarction) ([place])   Allergies Allergies  Allergen Reactions  . Tramadol Other (See Comments)    SEIZURES "Staring off spells"  . Atorvastatin Other (See Comments)    MYALGIAS    Diagnostic Studies/Procedures    LHC: 12/15/15  Conclusion     The left ventricular systolic function is normal.  LV end diastolic pressure is mildly elevated.  The left ventricular ejection fraction is 55-65% by visual estimate.  Prox RCA lesion, 30 %stenosed.  Mid RCA lesion, 60 %stenosed.  Mid RCA to Dist RCA lesion, 60 %stenosed.  Dist RCA lesion, 99 %stenosed.  Ost LAD lesion, 0 %stenosed.  Mid Cx lesion, 0 %stenosed.  Ost 2nd Mrg to 2nd Mrg lesion, 40 %stenosed.  A STENT SYNERGY DES 2.25X12 drug eluting stent was successfully placed.  1st Diag lesion, 95 %stenosed.  Post intervention, there is a 0% residual stenosis.  1. 2 vessel obstructive CAD - 95% first diagonal - 99% distal RCA. This is just distal to prior stent and appears chronic with left to right collaterals. The vessel is very small in this area and involves with PLOM and PDA bifurcation 2. Continued patency of stents in the proximal LAD and mid LCx. 3. Normal LV function with mildly elevated LVEDP 4. Successful stenting of the first diagonal with DES.  Plan: DAPT for one year. Medical management for residual disease in RCA. If patient has refractory angina we could attempt repeat PCI of this lesion but long term patency would be poor due to vessel size and diffuse disease.    _____________   History of Present Illness     55 y.o. male with a history of CAD, COPD, HTN, HLD, right frontal AVM  status post right frontal craniotomy and recurrent bleed in 11/16,  GERD and seizure  who presented to [facility] ED by EMS  for evaluation of chest pain.    Hx of CAD status post prior MI andDES to the LCx in 8/09 and staged PCI of LAD with DES and distal RCA with BMS. LHC in 2011 at [facility] with FFR of LAD and LCx without hemodynamically significant lesions. Myoview in 2013 was lowrisk.  He was doing well on cardiac stand point when last seen by Richardson Dopp 11/21/15 for cardiac clearance for lumber spine surgery.  He was cleared with mod risk for CV complications during anynon-cardiac surgery regardless of further testing. S/p lumbar decompression 11/26/15. No complications.   He was in [place] up-until his presentation here when he had an onset upper sternal sharp pain while watching TV. The pain radiated to left arm associated with nausea and severe diaphoresis. He took 2 sublingual nitroglycerin without improvement and EMS was called. He was given another nitroglycerin and aspirin 324 mg with minimal improvement. His pain eventually resolved in ER after receiving fentanyl. He states that this is similar to his prior cardiac pain. At that time he had arm pain now has a arm pain and chest pain. Patient denies any recent exertional dyspnea or chest pain. Patient denies palpitation, orthopnea, PND, syncope, melena, blood in his stool or urine, lower extremity edema.  EKG showed sinus rhythm at rate of 73 bpm. No acute changes. Point-of-care troponin 0.12. Electrolytes normal.  Elevated blood glucose. His x-ray without acute cardiopulmonary disease.  The patient was chest pain-free at the time of assessment and started on IV heparin. He was off aspirin for one week prior to surgery. No interruption since then.  Hospital Course     Consultants: None   He was observed over the weekend and prepped for [facility] on Monday 12/15/15 with Dr. [place]. Trop peaked at 7.91. LHC showed 2 vessel obstructive disease, 95%  to first diag, and 99% to distal RCA. Received a DES to the first diag, with noted continued patency of stents in the prox LAD and mid LCx with normal LV function and mildly elevated LVEDP. Plan is for ASA and Brilinta for at least one year.   Morning labs were stable post procedure. Radial cath site was stable. Worked well with cardiac rehab. He was seen and assessed by Dr. Ron Parker on 11/7 and determined stable for discharge home. Will arrange for follow up in the office.  _____________  Discharge Vitals Blood pressure (!) 142/67, pulse 73, temperature 97.8 F (36.6 C), temperature source Oral, resp. rate 13, height 5\' 10"  (1.778 m), weight 246 lb 0.5 oz (111.6 kg), SpO2 97 %.  Filed Weights   12/12/15 2008 12/14/15 1908 12/16/15 0526  Weight: 247 lb 4.8 oz (112.2 kg) 247 lb 12.8 oz (112.4 kg) 246 lb 0.5 oz (111.6 kg)    Labs & Radiologic Studies    CBC  Recent Labs  12/14/15 0135 12/16/15 0502  WBC 11.4* 9.1  HGB 13.6 13.6  HCT 39.1 40.8  MCV 87.9 88.5  PLT 217 0000000   Basic Metabolic Panel  Recent Labs  12/14/15 0135 12/16/15 0502  NA 135 139  K 3.7 4.1  CL 101 104  CO2 25 23  GLUCOSE 111* 84  BUN 14 9  CREATININE 0.77 0.83  CALCIUM 9.0 9.1   Liver Function Tests No results for input(s): AST, ALT, ALKPHOS, BILITOT, PROT, ALBUMIN in the last 72 hours. No results for input(s): LIPASE, AMYLASE in the last 72 hours. Cardiac Enzymes  Recent Labs  12/13/15 1038 12/13/15 1644  TROPONINI 4.79* 3.12*   BNP Invalid input(s): POCBNP D-Dimer No results for input(s): DDIMER in the last 72 hours. Hemoglobin A1C No results for input(s): HGBA1C in the last 72 hours. Fasting Lipid Panel No results for input(s): CHOL, HDL, LDLCALC, TRIG, CHOLHDL, LDLDIRECT in the last 72 hours. Thyroid Function Tests No results for input(s): TSH, T4TOTAL, T3FREE, THYROIDAB in the last 72 hours.  Invalid input(s): FREET3 _____________  Dg Chest 2 View  Result Date: 12/12/2015 CLINICAL  DATA:  Chest pain EXAM: CHEST  2 VIEW COMPARISON:  12/07/2014 FINDINGS: Cardiomediastinal silhouette is stable. No infiltrate or pleural effusion. No pulmonary edema. Mild degenerative changes thoracic spine. IMPRESSION: No active cardiopulmonary disease. Electronically Signed   By: Lahoma Crocker M.D.   On: 12/12/2015 17:12   Dg Lumbar Spine 2-3 Views  Result Date: 11/25/2015 CLINICAL DATA:  L3-4 and L4-5 laminectomy EXAM: LUMBAR SPINE - 2-3 VIEW COMPARISON:  Lumbar radiographs September 05, 2015; lumbar myelogram and lumbar myelogram CT September 17, 2015 FINDINGS: Cross-table lateral lumbar image labeled #1 shows metallic device is posterior to the inferior aspect of the L4 vertebral body and posterior to the S1 vertebral body respectively. No fracture or spondylolisthesis. There is aortic atherosclerosis. Cross-table lateral lumbar image labeled #2 reveals metallic probes with tips posterior to the L3-4, L4-5, and L5-S1 interspaces. Cutting device overlies the L4 spinous process. No fracture or spondylolisthesis. There  is aortic atherosclerosis. IMPRESSION: On final submitted cross-table lateral lumbar image, metallic probe tips are posterior to the L3-4, L4-5, L5-S1 interspaces. No evident fracture or spondylolisthesis. There is aortic atherosclerosis. Electronically Signed   By: Lowella Grip III M.D.   On: 11/25/2015 15:22   Disposition   Pt is being discharged home today in good condition.  Follow-up Plans & Appointments    Follow-up Information    Charlie Pitter, PA-C Follow up on 12/25/2015.   Specialties:  Cardiology, Radiology Why:  at 10:15am for your hospital follow up.  Contact information: [address] [place] [address] [phone]          Discharge Instructions    AMB Referral to Cardiac Rehabilitation - Phase II    Complete by:  As directed    Diagnosis:  NSTEMI   Diet - low sodium heart healthy    Complete by:  As directed    Discharge instructions     Complete by:  As directed    Radial Site Care Refer to this sheet in the next few weeks. These instructions provide you with information on caring for yourself after your procedure. Your caregiver may also give you more specific instructions. Your treatment has been planned according to current medical practices, but problems sometimes occur. Call your caregiver if you have any problems or questions after your procedure. HOME CARE INSTRUCTIONS You may shower the day after the procedure.Remove the bandage (dressing) and gently wash the site with plain soap and water.Gently pat the site dry.  Do not apply powder or lotion to the site.  Do not submerge the affected site in water for 3 to 5 days.  Inspect the site at least twice daily.  Do not flex or bend the affected arm for 24 hours.  No lifting over 5 pounds (2.3 kg) for 5 days after your procedure.  Do not drive home if you are discharged the same day of the procedure. Have someone else drive you.  You may drive 24 hours after the procedure unless otherwise instructed by your caregiver.  What to expect: Any bruising will usually fade within 1 to 2 weeks.  Blood that collects in the tissue (hematoma) may be painful to the touch. It should usually decrease in size and tenderness within 1 to 2 weeks.  SEEK IMMEDIATE MEDICAL CARE IF: You have unusual pain at the radial site.  You have redness, warmth, swelling, or pain at the radial site.  You have drainage (other than a small amount of blood on the dressing).  You have chills.  You have a fever or persistent symptoms for more than 72 hours.  You have a fever and your symptoms suddenly get worse.  Your arm becomes pale, cool, tingly, or numb.  You have heavy bleeding from the site. Hold pressure on the site.   Increase activity slowly    Complete by:  As directed       Discharge Medications   Current Discharge Medication List    START taking these medications   Details    isosorbide mononitrate (IMDUR) 30 MG 24 hr tablet Take 1 tablet (30 mg total) by mouth daily. Qty: 30 tablet, Refills: 6    rosuvastatin (CRESTOR) 40 MG tablet Take 1 tablet (40 mg total) by mouth daily at 6 PM. Qty: 30 tablet, Refills: 6    ticagrelor (BRILINTA) 90 MG TABS tablet Take 1 tablet (90 mg total) by mouth 2 (two) times daily. Qty:  60 tablet, Refills: 0      CONTINUE these medications which have CHANGED   Details  acetaminophen (TYLENOL) 500 MG tablet Take 1 tablet (500 mg total) by mouth every 6 (six) hours as needed for headache. Qty: 30 tablet, Refills: 0      CONTINUE these medications which have NOT CHANGED   Details  albuterol (PROVENTIL HFA;VENTOLIN HFA) 108 (90 BASE) MCG/ACT inhaler Inhale 2 puffs into the lungs every 6 (six) hours as needed for wheezing or shortness of breath. Qty: 1 Inhaler, Refills: 2    aspirin EC 81 MG tablet Take 81 mg by mouth daily.    butalbital-acetaminophen-caffeine (FIORICET, ESGIC) 50-325-40 MG tablet Take 1 tablet by mouth every 6 (six) hours as needed for headache. Qty: 12 tablet, Refills: 5    Cholecalciferol 1000 units tablet Take 1,000 Units by mouth daily.    divalproex (DEPAKOTE ER) 500 MG 24 hr tablet Take 2 tablets (1,000 mg total) by mouth at bedtime. Qty: 60 tablet, Refills: 11    docusate sodium (COLACE) 100 MG capsule Take 200 mg by mouth daily.     famotidine (PEPCID) 20 MG tablet Take 20 mg by mouth daily.    HYDROcodone-acetaminophen (NORCO) 10-325 MG per tablet Take 1 tablet by mouth every 6 (six) hours as needed (pain).     metoprolol tartrate (LOPRESSOR) 25 MG tablet Take 0.5 tablets (12.5 mg total) by mouth 2 (two) times daily. Qty: 30 tablet, Refills: 12    nitroGLYCERIN (NITROSTAT) 0.4 MG SL tablet Place 1 tablet (0.4 mg total) under the tongue every 5 (five) minutes as needed for chest pain (CP or SOB). Qty: 25 tablet, Refills: 3    oxcarbazepine (TRILEPTAL) 600 MG tablet Take 1 tablet (600 mg total)  by mouth 2 (two) times daily. Qty: 180 tablet, Refills: 4    predniSONE (DELTASONE) 10 MG tablet Take 10-60 mg by mouth daily.      STOP taking these medications     atorvastatin (LIPITOR) 40 MG tablet          Aspirin prescribed at discharge?  Yes High Intensity Statin Prescribed? (Lipitor 40-80mg  or Crestor 20-40mg ): Yes Beta Blocker Prescribed? Yes For EF <40%, was ACEI/ARB Prescribed? No: EF ok ADP Receptor Inhibitor Prescribed? (i.e. Plavix etc.-Includes Medically Managed Patients): Yes For EF <40%, Aldosterone Inhibitor Prescribed? No: EF ok Was EF assessed during THIS hospitalization? Yes Was Cardiac Rehab II ordered? (Included Medically managed Patients): Yes   Outstanding Labs/Studies   FLP and LFTs if able to tolerate statin.   Duration of Discharge Encounter   Greater than 30 minutes including physician time.  Signed, Reino Bellis NP-C 12/16/2015, 8:08 AM  Patient seen and examined. I agree with the assessment and plan as detailed above. See also my additional thoughts below.   I made the decision for discharge. I agree with the note as outlined above and the plan. The patient is ready to go home.  Dola Argyle, MD, [facility] 12/16/2015 9:39 AM

## 2015-12-17 MED FILL — Verapamil HCl IV Soln 2.5 MG/ML: INTRAVENOUS | Qty: 2 | Status: AC

## 2015-12-17 NOTE — Telephone Encounter (Signed)
Appt is 12/25/15 at 10:30AM, pt aware.

## 2015-12-17 NOTE — Telephone Encounter (Signed)
Patient contacted regarding discharge from [facility] on 12/16/15. Patient understands to follow up with provider Melina Copa on 12/25/15 at [place].  Patient understands discharge instructions? yes Patient understands medications and regiment? yes Patient understands to bring all medications to this visit? yes

## 2015-12-24 ENCOUNTER — Encounter: Payer: Self-pay | Admitting: Physician Assistant

## 2015-12-24 NOTE — Progress Notes (Signed)
Cardiology Office Note    Date:  12/25/2015  ID:  Peter Barnett, DOB [birth year], MRN [id number] PCP:  Peter Sou, MD  Cardiologist:  Peter Barnett   Chief Complaint: f/u NSTEMI  History of Present Illness:  Peter Barnett is a 55 y.o. male with history of CAD (MI/DES to Cx 2009 with staged PCI of LAD with DES and distal RCA with BMS; NSTEMI 12/2015 s/p DES to D1), COPD, HTN, HLD, right frontal AVM status post right frontal craniotomy with recurrent bleed in 11/16, tobacco abuse, GERD and seizure who presented to [facility] with chest pain. Prior to recent admission he had had a LHC 2011  at [facility] with FFR of LAD and LCx without hemodynamically significant lesions. Myoview in 2013 was low risk. HHe was doing well on cardiac stand point when last seen by Peter Barnett 11/21/15 for cardiac clearance for lumber spine surgery.  He was cleared with mod risk for CV complications during any non-cardiac surgery regardless of further testing. He underwent lumbar decompression [id number] without complications. He was in his usual state of health up until 12/2015 when he developed recurrent chest pain and was admitted for NSTEMI with troponin peak 7.9. LHC 12/15/15: 95% D1, 99% distal RCA (with L-R collaterals), s/p DES to D1, normal LVEF with mildly elevated LVEDP - the RCA lesion was recommended for medical therapy, to consider PCI for refractory angina although long-term patency of PCI would be poor due to vessel size and diffuse disease. He was placed on aspirin and Brilinta. LDL was 169, LFTs wnl, A1C 5.6. The patient hadn't been taking his atorvastatin due to intolerance and was switched to Crestor.  He presents back for follow-up today with his wife. Overall he says he's doing well. He has noticed intermittent SOB without particular pattern. He also has COPD and has used his inhaler with some relief. No LEE or orthopnea. No recurrent CP or arm pain like prior angina. No issues so far on the different statin. He  is contemplating doing cardiac rehab but is not pleased that it will cost $45/session.  Past Medical History:  Diagnosis Date  . CAD (coronary artery disease)    a. MI/DES to Cx 2009 with staged PCI of LAD with DES and distal RCA with BMS. b. NSTEMI 12/2015 s/p DES to D1, known occlusion of dRCA tx medically.  . Chronic lower back pain    Still with recurrent left sided LBP with paresthesias down left leg --primarily with activities: pain pill use is avg <90 tabs per mo  . COPD (chronic obstructive pulmonary disease) ([place])    NOTED on CT chest 11/2013 done for lung ca screening.  Hosp for acute exac 11/2014.  Spirometry not supportive of this dx as of pulm eval 2017, though.  Spireva trial by Peter Barnett 02/2015.  Marland Kitchen GERD (gastroesophageal reflux disease)   . History of adenomatous polyp of colon 02/2009;02/2014   Recall 5 yrs ([facility])  . History of kidney stones   . HTN (hypertension)   . Hyperlipidemia   . Intracranial hemorrhage ([place]) 12/27/14   small acute hemorrhage in gliotic medial R frontal lobe  . Memory loss   . MI (myocardial infarction) 2009   "just had arm pain; never any chest pain"  . Migraine syndrome 10/07/2011    a couple per month usually, but worsening 2017 so Dr. Krista Blue adjusted med  . 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 Seizures ([place])    last yr ago  . 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 Krista Blue in neurology doing w/u.  Marland Kitchen Stroke ([place]) 12/2014  . Tobacco abuse   . Urethral stricture since 1981   Recurrent dilation has been required [facility] urology)    Past Surgical History:  Procedure Laterality Date  . CARDIAC CATHETERIZATION  2011   [facility], records unavailable: pt reports "normal".  . CARDIAC CATHETERIZATION N/A 12/15/2015   Procedure: Left Heart Cath and Coronary Angiography;  Surgeon: Peter M Martinique, MD;  Location: [place] CV [facility];  Service: Cardiovascular;  Laterality: N/A;  .  CARDIAC CATHETERIZATION N/A 12/15/2015   Procedure: Coronary Stent Intervention;  Surgeon: Peter M Martinique, MD;  Location: [place] CV [facility];  Service: Cardiovascular;  Laterality: N/A;  . COLONOSCOPY W/ POLYPECTOMY  02/2009;02/2014   Recall 5 yr (aden poly, hyperplast polyp, int and ext hemorr).  Next recall is 02/2019.  Marland Kitchen CORONARY ANGIOPLASTY WITH STENT PLACEMENT  2009   3 DES to LCx; still with known distal RCA/PDA occlusion.  EF 60% by LV-gram.  . CRANIOTOMY FOR AVM  1981   Dx'd after pt began having seizures.  . CT ANGIOGRAM (CEREBRAL)  12/31/14   NORMAL--plan per neuro is to repeat this in 3 mo.  Pt states it was repeated Spring 2017 and was "fine"  . [place] SURGERY  2008; 2009   Dr. Joya Barnett  . LUMBAR LAMINECTOMY/DECOMPRESSION MICRODISCECTOMY N/A 11/25/2015   Procedure: Lumbar three- four Lumbar four- five Laminectomy/Foraminotomy;  Surgeon: Peter Cha, MD;  Location: [place];  Service: Neurosurgery;  Laterality: N/A;  L3-4 L4-5 Laminectomy/Foraminotomy/possible Lumbar Drain  . TONSILLECTOMY     "I was little"  . TRANSTHORACIC ECHOCARDIOGRAM  12/07/14   Normal LV size/fxn, EF 60%, normal valves.    Current Medications: Current Outpatient Prescriptions  Medication Sig Dispense Refill  . acetaminophen (TYLENOL) 500 MG tablet Take 1 tablet (500 mg total) by mouth every 6 (six) hours as needed for headache. 30 tablet 0  . albuterol (PROVENTIL HFA;VENTOLIN HFA) 108 (90 BASE) MCG/ACT inhaler Inhale 2 puffs into the lungs every 6 (six) hours as needed for wheezing or shortness of breath. 1 Inhaler 2  . aspirin EC 81 MG tablet Take 81 mg by mouth daily.    . butalbital-acetaminophen-caffeine (FIORICET, ESGIC) 50-325-40 MG tablet Take 1 tablet by mouth every 6 (six) hours as needed for headache. 12 tablet 5  . Cholecalciferol 1000 units tablet Take 1,000 Units by mouth daily.    . divalproex (DEPAKOTE ER) 500 MG 24 hr tablet Take 2 tablets (1,000 mg total) by mouth at bedtime. 60 tablet 11   . docusate sodium (COLACE) 100 MG capsule Take 200 mg by mouth daily.     . famotidine (PEPCID) 20 MG tablet Take 20 mg by mouth daily.    Marland Kitchen HYDROcodone-acetaminophen (NORCO) 10-325 MG per tablet Take 1 tablet by mouth every 6 (six) hours as needed (pain).     . isosorbide mononitrate (IMDUR) 30 MG 24 hr tablet Take 1 tablet (30 mg total) by mouth daily. 30 tablet 6  . metoprolol tartrate (LOPRESSOR) 25 MG tablet Take 0.5 tablets (12.5 mg total) by mouth 2 (two) times daily. 30 tablet 12  . nitroGLYCERIN (NITROSTAT) 0.4 MG SL tablet Place 1 tablet (0.4 mg total) under the tongue every 5 (five) minutes as needed for chest pain (CP or SOB). 25 tablet 3  . oxcarbazepine (TRILEPTAL) 600 MG tablet Take 1 tablet (600 mg total) by mouth 2 (two)  times daily. 180 tablet 4  . predniSONE (DELTASONE) 10 MG tablet Take 10-60 mg by mouth daily.    . rosuvastatin (CRESTOR) 40 MG tablet Take 1 tablet (40 mg total) by mouth daily at 6 PM. 30 tablet 6  . ticagrelor (BRILINTA) 90 MG TABS tablet Take 1 tablet (90 mg total) by mouth 2 (two) times daily. 60 tablet 0   No current facility-administered medications for this visit.      Allergies:   Tramadol and Atorvastatin   Social History   Social History  . Marital status: Married    Spouse name: [name]  . Number of children: 2  . Years of education: 10 th   Occupational History  . retired  Retired    retired   Social History Main Topics  . Smoking status: Former Smoker    Packs/day: 3.00    Years: 27.00    Types: Cigarettes    Quit date: 02/08/2001  . Smokeless tobacco: Never Used  . Alcohol use 4.2 oz/week    7 Cans of beer per week     Comment: 12 oz beer   . Drug use: No  . Sexual activity: Yes   Other Topics Concern  . None   Social History Narrative   Patient lives at home with his wife [name]).   Two adult children.   Retired from [employer] approx age 39 due to medical reasons (disabled due to memory issues, seizure d/o and chronic LBP).    Education 10th grade.   Right handed.   Caffeine two cups of tea and two cups of coffee.   Tobacco 45 pack-yr hx, quit approx age 2.  Alcohol: 1-2 beers per night.     No hx of alc or drug problems.   No exercise.   Diet: regular     Family History:  The patient's family history includes Asthma in his father; Cancer in his father; Heart attack in his paternal uncle; Hyperlipidemia in his mother; Hypertension in his father.   ROS:   Please see the history of present illness. All other systems are reviewed and otherwise negative.    PHYSICAL EXAM:   VS:  BP 126/88 (BP Location: Right Arm, Patient Position: Sitting, Cuff Size: Large)   Pulse 64   Ht 5\' 10"  (1.778 m)   Wt 251 lb 6.4 oz (114 kg)   BMI 36.07 kg/m   BMI: Body mass index is 36.07 kg/m. GEN: Well nourished, well developed WM, in no acute distress  HEENT: normocephalic, atraumatic Neck: no JVD, carotid bruits, or masses Cardiac: RRR; no murmurs, rubs, or gallops, no edema  Respiratory:  clear to auscultation bilaterally, normal work of breathing GI: soft, nontender, nondistended, + BS MS: no deformity or atrophy  Skin: warm and dry, no rash, right radial cath site without hematoma or ecchymosis; good pulse. Neuro:  Alert and Oriented x 3, Strength and sensation are intact, follows commands Psych: euthymic mood, full affect  Wt Readings from Last 3 Encounters:  12/25/15 251 lb 6.4 oz (114 kg)  12/16/15 246 lb 0.5 oz (111.6 kg)  11/25/15 254 lb 6.4 oz (115.4 kg)      Studies/Labs Reviewed:   EKG:  EKG was ordered today and personally reviewed by me and demonstrates NSR 64bpm, left axis deviation, TWI I, aVL  Recent Labs: 06/17/2015: TSH 1.730 12/12/2015: ALT 33 12/16/2015: BUN 9; Creatinine, Ser 0.83; Hemoglobin 13.6; Platelets 188; Potassium 4.1; Sodium 139   Lipid Panel    Component Value  Date/Time   CHOL 244 (H) 12/13/2015 0117   TRIG 94 12/13/2015 0117   HDL 56 12/13/2015 0117   CHOLHDL 4.4 12/13/2015  0117   VLDL 19 12/13/2015 0117   LDLCALC 169 (H) 12/13/2015 0117    Additional studies/ records that were reviewed today include: Summarized above.    ASSESSMENT & PLAN:   1. NSTEMI/CAD - overall doing better post PCI. In reviewing his history, he had prior intracranial hemorrhage in 12/2014 thus Brilinta is not ideal choice for DAPT. D/w Dr. Saunders Revel (DOD) - would recommend the patient change to Plavix. Per our discussion, will have him take last dose of Brilinta tonight, then take 300mg  of Plavix tomorrow then 75mg  daily of Plavix the following day thereafter. He denies any issues with Plavix in the past. He has noticed some breathlessness that does not seem exclusively tied to exertion. Will follow to see if this improves off Brilinta. Asked patient to keep [place] informed.  2. HTN - continue present regimen. 3. Hyperlipidemia - will recheck CMET and lipids in 6 weeks. 4. Tobacco abuse - he remains abstinent.  Disposition: F/u with Dr. Irish Barnett in 2-3 months.   Medication Adjustments/Labs and Tests Ordered: Current medicines are reviewed at length with the patient today.  Concerns regarding medicines are outlined above. Medication changes, Labs and Tests ordered today are summarized above and listed in the Patient Instructions accessible in Encounters.   Raechel Ache PA-C  12/25/2015 10:43 AM    [address], [place], [place]  [address] Phone: [phone]; Fax: [phone]

## 2015-12-25 ENCOUNTER — Telehealth: Payer: Self-pay | Admitting: *Deleted

## 2015-12-25 ENCOUNTER — Encounter: Payer: Self-pay | Admitting: Physician Assistant

## 2015-12-25 ENCOUNTER — Ambulatory Visit (INDEPENDENT_AMBULATORY_CARE_PROVIDER_SITE_OTHER): Payer: Medicare HMO | Admitting: Physician Assistant

## 2015-12-25 VITALS — BP 126/88 | HR 64 | Ht 70.0 in | Wt 251.4 lb

## 2015-12-25 DIAGNOSIS — I10 Essential (primary) hypertension: Secondary | ICD-10-CM

## 2015-12-25 DIAGNOSIS — E785 Hyperlipidemia, unspecified: Secondary | ICD-10-CM

## 2015-12-25 DIAGNOSIS — I214 Non-ST elevation (NSTEMI) myocardial infarction: Secondary | ICD-10-CM

## 2015-12-25 DIAGNOSIS — Z720 Tobacco use: Secondary | ICD-10-CM

## 2015-12-25 DIAGNOSIS — I2510 Atherosclerotic heart disease of native coronary artery without angina pectoris: Secondary | ICD-10-CM | POA: Diagnosis not present

## 2015-12-25 MED ORDER — CLOPIDOGREL BISULFATE 75 MG PO TABS: ORAL_TABLET | ORAL | 3 refills | Status: DC

## 2015-12-25 NOTE — Patient Instructions (Addendum)
Medication Instructions:  Your physician has recommended you make the following change in your medication:  1.  STOP the Brilinta (YOUR LAST DOSE WILL BE TONIGHT) 2,  START Plavix tomorrow (FRIDAY) by taking 4 tablets of 75 mg and then Saturday start taking 1 tablet daily    Labwork: 6 WEEKS:  FASTING LIPID & CMET  Testing/Procedures: None ordered  Follow-Up: Your physician recommends that you schedule a follow-up appointment in: 2-3 MONTHS WITH DR. VARANASI  Any Other Special Instructions Will Be Listed Below (If Applicable).     If you need a refill on your cardiac medications before your next appointment, please call your pharmacy.

## 2015-12-25 NOTE — Telephone Encounter (Signed)
error 

## 2016-01-07 ENCOUNTER — Ambulatory Visit: Payer: Medicare HMO | Admitting: Physical Therapy

## 2016-01-08 ENCOUNTER — Ambulatory Visit (INDEPENDENT_AMBULATORY_CARE_PROVIDER_SITE_OTHER): Payer: Medicare HMO | Admitting: Neurology

## 2016-01-08 ENCOUNTER — Encounter: Payer: Self-pay | Admitting: Neurology

## 2016-01-08 VITALS — BP 152/88 | HR 65 | Ht 70.0 in | Wt 252.0 lb

## 2016-01-08 DIAGNOSIS — R569 Unspecified convulsions: Secondary | ICD-10-CM

## 2016-01-08 DIAGNOSIS — R202 Paresthesia of skin: Secondary | ICD-10-CM | POA: Diagnosis not present

## 2016-01-08 DIAGNOSIS — M5416 Radiculopathy, lumbar region: Secondary | ICD-10-CM

## 2016-01-08 DIAGNOSIS — G43009 Migraine without aura, not intractable, without status migrainosus: Secondary | ICD-10-CM | POA: Diagnosis not present

## 2016-01-08 DIAGNOSIS — Z9889 Other specified postprocedural states: Secondary | ICD-10-CM

## 2016-01-08 DIAGNOSIS — J449 Chronic obstructive pulmonary disease, unspecified: Secondary | ICD-10-CM | POA: Diagnosis not present

## 2016-01-08 NOTE — Progress Notes (Signed)
Chief Complaint  Patient presents with  . Memory Loss    MMSE 23/30 - 8 animals.  He is here with his wife, Peter Barnett.  Feels his memory has continued to decline.  . Seizures    No seizure activity reported.  . Lumbar Stenosis    Dr. Joya Salm performed his back surgery on 11/25/15.  He will starting physical therapy next week.  His PT was delayed due to having a heart attack on 12/12/15.  He was on Birlinta for two week but has sinced been changed to aspirin 81mg  and Plavix 75mg .  . Migraine    Two weeks ago, reports having the worst headache he has had in some time. The pain was unrelieved with Tylenol and Fioricet.  It improved after taking a Flexeril (left over medication from his surgery) and rest.  He is no longer taking Depakote ER due to cost.      PATIENT: Peter Barnett DOB: 06/13/1960  Chief Complaint  Patient presents with  . Memory Loss    MMSE 23/30 - 8 animals.  He is here with his wife, Peter Barnett.  Feels his memory has continued to decline.  . Seizures    No seizure activity reported.  . Lumbar Stenosis    Dr. Joya Salm performed his back surgery on 11/25/15.  He will starting physical therapy next week.  His PT was delayed due to having a heart attack on 12/12/15.  He was on Birlinta for two week but has sinced been changed to aspirin 81mg  and Plavix 75mg .  . Migraine    Two weeks ago, reports having the worst headache he has had in some time. The pain was unrelieved with Tylenol and Fioricet.  It improved after taking a Flexeril (left over medication from his surgery) and rest.  He is no longer taking Depakote ER due to cost.     HISTORICAL  Peter Barnett is a 55 years old male, accompanied by his wife,   He had PMH of right frontal AVM, s/p right frontal craniotomy, hypertension, hyperlipidemia, COPD, gerd, CAD, s/p of stent 2009, seizure disorder, chronic back pain, kidney stone, who presents with seizure, HA and intracranial bleeding.  He suffered complex partial seizure, in  1981, was diagnosed with right frontal AV malformation, had a right frontal craniotomy by Dr. Lesly Dukes at [facility] in [facility], his seizure has been under good control, but he continued to have occasional staring spells, which was considered due to simple partial or complex partial seizure, over the years, he has tried different medications, Dilantin caused gingiva hypertrophy, Keppra cause mood disorder, Topamax complains of memory trouble,   He was also seen by Dr. Joya Salm for his lumbar stenosis, he had 2 lumbar surgery, most recent one was in 2010,  He is receiving some epidural injection for his recurrent chronic low back pain which has been beneficial   He had 10 years of education, quit high school because he never was good at school, and did not like school, works at [employer] job all his life, went on disability since 1981 after his right frontal craniotomy, he stays at home, doing household chores, patient stated he never had good memory all his life, wife noticed worsening memory over the past few years. EEG was normal at 2014  He also has frequent headaches,  He presented with recurrent seizure in December 07 2014, left arm numbness, intermittent left facial numbness, December 24 2014, left arm uncontrollable shaking, mild confusion, difficulty talking,  CAT scan of the brain in December 27 2014 reviewed with patient: Small acute hemorrhage in an area of gliotic medial right frontal lobe. Chronic postoperative and ischemic changes. I also personally reviewed CT angiogram: Remote treatment for right frontal AVM. There has been right frontal craniotomy. There are multiple surgical clips as well as in the vascular embolic particles.no evidence of recurrent AVM or aneurysm on the CTA.   He was discharged home with Trileptal 150 mg 3 tablets 3 times a day, Depakote DR 250 mg twice a day, he complains of fatigue, dizziness with current dose of medications   I also reviewed laboratory  in December 28 2014, low sodium 133, normal CBC with exception of low platelets 144  UPDATE May 9th 2017: I reviewed angiogram in November 2016: Angiographically no evidence of arteriovenous shunting to suggest residual, or recurrent arteriovenous malformation. No angiographic evidence of intracranial aneurysms or dissections. Venous outflow normal hemodynamically  He has no recurrent seizure, but there is recurrent episode of acute onset left facial left arm numbness lasting couple hours, He also complains of frequent headaches, often buildup quickly, vertex region moderate to severe pain, with associated light sensitivity, lasting more than 4 hours, movement made it worse, he has more than 15 headaches days in one month He also complains of gradual worsening memory loss, forgot the trip that he has gone to He has worsening low back pain, radiating pain to left lower extremity, he was seen by Dr. Joya Salm recently, was cleared by vascular neurosurgeon Dr.Nundkumar,   I personally reviewed CT myelogram in April 2017: Regrowth of posterior elements status post lumbar decompression worst at L4-5. Development of/worsening of central protrusion at L4-5 partially calcified. Severe spinal stenosis at the L4-5 level with RIGHT greater the LEFT L4 and L5 nerve root impingement. Central and rightward protrusion at L1-2 and foraminal and extraforaminal protrusion at L2-3 potentially affect the nerve roots at their respective levels.  UPDATE Jan 08 2016: Electrodiagnostic study in May 2017 showed mild chronic bilateral lumbar sacral radiculopathy  Today He complains of neck pain, radiating pain to his head, no arm numbness weakness.  He had bilateral L3 4 5 decompression of the thecal sac, removal of the osteophyte, lysis of adhesion, foraminotomy, decompression of the nerve roots on the microscope by Dr. Joya Salm on November 25 2015, which has helped his low back pain, he still has bilateral leg numbness and  hip pain, he has no gait abnormality  He also suffered a heart attack on December 13 2015  He is  taking Trileptal 600 mg twice a day for as well seizure control last seizure was in Nov 2016  Laboratory evaluation in November 2017 showed normal CBC with hemoglobin of 13.6, BMP, total cholesterol was 244, LDL was 169, A1c was 5.6, troponin was elevated 0.09  REVIEW OF SYSTEMS: Full 14 system review of systems performed and notable only for memory loss, numbness, confusion, neck back pain, shortness of breath  ALLERGIES: Allergies  Allergen Reactions  . Tramadol Other (See Comments)    SEIZURES "Staring off spells"  . Atorvastatin Other (See Comments)    MYALGIAS    HOME MEDICATIONS: Current Outpatient Prescriptions  Medication Sig Dispense Refill  . acetaminophen (TYLENOL) 500 MG tablet Take 1 tablet (500 mg total) by mouth every 6 (six) hours as needed for headache. 30 tablet 0  . albuterol (PROVENTIL HFA;VENTOLIN HFA) 108 (90 BASE) MCG/ACT inhaler Inhale 2 puffs into the lungs every 6 (six) hours as needed for wheezing  or shortness of breath. 1 Inhaler 2  . aspirin EC 81 MG tablet Take 81 mg by mouth daily.    . butalbital-acetaminophen-caffeine (FIORICET, ESGIC) 50-325-40 MG tablet Take 1 tablet by mouth every 6 (six) hours as needed for headache. 12 tablet 5  . Cholecalciferol 1000 units tablet Take 1,000 Units by mouth daily.    . clopidogrel (PLAVIX) 75 MG tablet Take 4 tablets by mouth daily on day 1 then take 1 tablet by mouth daily thereafter 94 tablet 3  . docusate sodium (COLACE) 100 MG capsule Take 200 mg by mouth daily.     . famotidine (PEPCID) 20 MG tablet Take 20 mg by mouth daily.    Marland Kitchen HYDROcodone-acetaminophen (NORCO) 10-325 MG per tablet Take 1 tablet by mouth every 6 (six) hours as needed (pain).     . isosorbide mononitrate (IMDUR) 30 MG 24 hr tablet Take 1 tablet (30 mg total) by mouth daily. 30 tablet 6  . metoprolol tartrate (LOPRESSOR) 25 MG tablet Take 0.5  tablets (12.5 mg total) by mouth 2 (two) times daily. 30 tablet 12  . nitroGLYCERIN (NITROSTAT) 0.4 MG SL tablet Place 1 tablet (0.4 mg total) under the tongue every 5 (five) minutes as needed for chest pain (CP or SOB). 25 tablet 3  . oxcarbazepine (TRILEPTAL) 600 MG tablet Take 1 tablet (600 mg total) by mouth 2 (two) times daily. 180 tablet 4  . predniSONE (DELTASONE) 10 MG tablet Take 10-60 mg by mouth daily.    . rosuvastatin (CRESTOR) 40 MG tablet Take 1 tablet (40 mg total) by mouth daily at 6 PM. 30 tablet 6   No current facility-administered medications for this visit.     PAST MEDICAL HISTORY: Past Medical History:  Diagnosis Date  . CAD (coronary artery disease)    a. MI/DES to Cx 2009 with staged PCI of LAD with DES and distal RCA with BMS. b. NSTEMI 12/2015 s/p DES to D1, known occlusion of dRCA tx medically.  . Chronic lower back pain    Still with recurrent left sided LBP with paresthesias down left leg --primarily with activities: pain pill use is avg <90 tabs per mo  . COPD (chronic obstructive pulmonary disease) ([place])    NOTED on CT chest 11/2013 done for lung ca screening.  Hosp for acute exac 11/2014.  Spirometry not supportive of this dx as of pulm eval 2017, though.  Spireva trial by Dr. Elsworth Soho 02/2015.  Marland Kitchen GERD (gastroesophageal reflux disease)   . Heart attack 12/12/2015  . History of adenomatous polyp of colon 02/2009;02/2014   Recall 5 yrs ([facility])  . History of kidney stones   . HTN (hypertension)   . Hyperlipidemia   . Intracranial hemorrhage ([place]) 12/27/14   small acute hemorrhage in gliotic medial R frontal lobe  . Memory loss   . MI (myocardial infarction) 2009   "just had arm pain; never any chest pain"  . Migraine syndrome 10/07/2011    a couple per month usually, but worsening 2017 so Dr. Krista Blue adjusted med  . 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 Seizures ([place])    last yr ago  . Short-term memory loss 10/07/2011   "post brain OR &  from his seizures"--worsening 2017, Dr. Krista Blue in neurology doing w/u.  Marland Kitchen Stroke ([place]) 12/2014  . Tobacco abuse   . Urethral stricture since 1981   Recurrent dilation has been required [facility] urology)    PAST SURGICAL HISTORY: Past Surgical History:  Procedure Laterality Date  .  BACK SURGERY  11/25/2015  . CARDIAC CATHETERIZATION  2011   [place], records unavailable: pt reports "normal".  . CARDIAC CATHETERIZATION N/A 12/15/2015   Procedure: Left Heart Cath and Coronary Angiography;  Surgeon: Peter M Martinique, MD;  Location: [place] [facility];  Service: Cardiovascular;  Laterality: N/A;  . CARDIAC CATHETERIZATION N/A 12/15/2015   Procedure: Coronary Stent Intervention;  Surgeon: Peter M Martinique, MD;  Location: [place] [facility];  Service: Cardiovascular;  Laterality: N/A;  . COLONOSCOPY W/ POLYPECTOMY  02/2009;02/2014   Recall 5 yr (aden poly, hyperplast polyp, int and ext hemorr).  Next recall is 02/2019.  Marland Kitchen CORONARY ANGIOPLASTY WITH STENT PLACEMENT  2009   3 DES to LCx; still with known distal RCA/PDA occlusion.  EF 60% by LV-gram.  . CRANIOTOMY FOR AVM  1981   Dx'd after pt began having seizures.  . CT ANGIOGRAM (CEREBRAL)  12/31/14   NORMAL--plan per neuro is to repeat this in 3 mo.  Pt states it was repeated Spring 2017 and was "fine"  . [place] SURGERY  2008; 2009   Dr. Joya Salm  . LUMBAR LAMINECTOMY/DECOMPRESSION MICRODISCECTOMY N/A 11/25/2015   Procedure: Lumbar three- four Lumbar four- five Laminectomy/Foraminotomy;  Surgeon: Leeroy Cha, MD;  Location: [facility];  Service: Neurosurgery;  Laterality: N/A;  L3-4 L4-5 Laminectomy/Foraminotomy/possible Lumbar Drain  . TONSILLECTOMY     "I was little"  . TRANSTHORACIC ECHOCARDIOGRAM  12/07/14   Normal LV size/fxn, EF 60%, normal valves.    FAMILY HISTORY: Family History  Problem Relation Age of Onset  . Hyperlipidemia Mother   . Cancer Father     Mesothelioma  . Hypertension Father   . Asthma Father   . Heart attack  Paternal Uncle     In 24s too  . Stroke Neg Hx     SOCIAL HISTORY:  Social History   Social History  . Marital status: Married    Spouse name: Peter Barnett  . Number of children: 2  . Years of education: 10 th   Occupational History  . retired  Retired    retired   Social History Main Topics  . Smoking status: Former Smoker    Packs/day: 3.00    Years: 27.00    Types: Cigarettes    Quit date: 02/08/2001  . Smokeless tobacco: Never Used  . Alcohol use 4.2 oz/week    7 Cans of beer per week     Comment: 12 oz beer   . Drug use: No  . Sexual activity: Yes   Other Topics Concern  . Not on file   Social History Narrative   Patient lives at home with his wife Peter Barnett).   Two adult children.   Retired from [employer] approx age 59 due to medical reasons (disabled due to memory issues, seizure d/o and chronic LBP).   Education 10th grade.   Right handed.   Caffeine two cups of tea and two cups of coffee.   Tobacco 45 pack-yr hx, quit approx age 8.  Alcohol: 1-2 beers per night.     No hx of alc or drug problems.   No exercise.   Diet: regular     PHYSICAL EXAM   Vitals:   01/08/16 0802  BP: (!) 152/88  Pulse: 65  Weight: 252 lb (114.3 kg)  Height: 5\' 10"  (1.778 m)    Not recorded      Body mass index is 36.16 kg/m.  PHYSICAL EXAMNIATION:  Gen: NAD, conversant, well nourised, obese, well groomed  Cardiovascular: Regular rate rhythm, no peripheral edema, warm, nontender. Eyes: Conjunctivae clear without exudates or hemorrhage Neck: Supple, no carotid bruise. Pulmonary: Clear to auscultation bilaterally   NEUROLOGICAL EXAM:  MENTAL STATUS: Speech:    Speech is normal; fluent and spontaneous with normal comprehension.  Cognition:     Orientation to time, place and person     Normal recent and remote memory     Normal Attention span and concentration     Normal Language, naming, repeating,spontaneous speech     Fund of knowledge   CRANIAL  NERVES: CN II: Visual fields are full to confrontation. Fundoscopic exam is normal with sharp discs and no vascular changes. Pupils are round equal and briskly reactive to light. CN III, IV, VI: extraocular movement are normal. No ptosis.End gaze horizontal nystagmus CN V: Facial sensation is intact to pinprick in all 3 divisions bilaterally. Corneal responses are intact.  CN VII: Face is symmetric with normal eye closure and smile. CN VIII: Hearing is normal to rubbing fingers CN IX, X: Palate elevates symmetrically. Phonation is normal. CN XI: Head turning and shoulder shrug are intact CN XII: Tongue is midline with normal movements and no atrophy.  MOTOR: There is no pronator drift of out-stretched arms. Muscle bulk and tone are normal. Muscle strength is normal.  REFLEXES: Reflexes are 2+ and symmetric at the biceps, triceps, knees, and ankles. Plantar responses are flexor.  SENSORY: Intact to light touch, pinprick, position sense, and vibration sense are intact in fingers and toes.  COORDINATION: Rapid alternating movements and fine finger movements are intact. There is no dysmetria on finger-to-nose and heel-knee-shin.    GAIT/STANCE: Antalgic, cautious  DIAGNOSTIC DATA (LABS, IMAGING, TESTING) - I reviewed patient records, labs, notes, testing and imaging myself where available.   ASSESSMENT AND PLAN  Peter Barnett is a 55 y.o. male   Complex partial seizure  Continue Trileptal 600 mg twice a day  History of right frontal AVM, status post right frontal craniectomy Recurrent right frontal area intracranial bleeding Chronic migraine headaches   Memory loss  Mini-Mental Status Examination23/30  Laboratory evaluatiofailed to demonstrate treatable etiology.  History of  lumbar stenosis  Worsening low back pain, radiating pain to left lower extremity    Marcial Pacas, M.D. Ph.D.  [facility] [address] [place], [place] [address] Ph: [phone] Fax: [phone]  [id number] Thurston Hole, MD

## 2016-01-09 ENCOUNTER — Encounter: Payer: Self-pay | Admitting: Family Medicine

## 2016-01-15 ENCOUNTER — Ambulatory Visit: Payer: Medicare HMO | Attending: Neurosurgery | Admitting: Physical Therapy

## 2016-01-15 DIAGNOSIS — G8929 Other chronic pain: Secondary | ICD-10-CM | POA: Insufficient documentation

## 2016-01-15 DIAGNOSIS — M545 Low back pain: Secondary | ICD-10-CM | POA: Diagnosis present

## 2016-01-15 NOTE — Therapy (Signed)
[facility] [place], [place], [address] Phone: [phone]   Fax:  [phone]  Physical Therapy Evaluation  Patient Details  Name: Peter Barnett MRN: [id number] Date of Birth: 26-Mar-1960 Referring Provider: Leeroy Cha MD.  Encounter Date: 01/15/2016      PT End of Session - 01/15/16 1133    Visit Number 1   Number of Visits 12   Date for PT Re-Evaluation 02/26/16   PT Start Time 1110   PT Stop Time 1159   PT Time Calculation (min) 49 min   Activity Tolerance Patient tolerated treatment well   Behavior During Therapy [facility] for tasks assessed/performed      Past Medical History:  Diagnosis Date  . CAD (coronary artery disease)    a. MI/DES to Cx 2009 with staged PCI of LAD with DES and distal RCA with BMS. b. NSTEMI 12/2015 s/p DES to D1, known occlusion of dRCA tx medically.  . Chronic lower back pain    Still with recurrent left sided LBP with paresthesias down left leg --primarily with activities: pain pill use is avg <90 tabs per mo  . COPD (chronic obstructive pulmonary disease) ([place])    NOTED on CT chest 11/2013 done for lung ca screening.  Hosp for acute exac 11/2014.  Spirometry not supportive of this dx as of pulm eval 2017, though.  Spireva trial by Dr. Elsworth Soho 02/2015.  Marland Kitchen GERD (gastroesophageal reflux disease)   . Heart attack 12/12/2015  . History of adenomatous polyp of colon 02/2009;02/2014   Recall 5 yrs ([facility])  . History of kidney stones   . HTN (hypertension)   . Hyperlipidemia   . Intracranial hemorrhage ([place]) 12/27/14   small acute hemorrhage in gliotic medial R frontal lobe  . Memory loss   . MI (myocardial infarction) 2009   "just had arm pain; never any chest pain"  . Migraine syndrome 10/07/2011    a couple per month usually, but worsening 2017 so Dr. Krista Blue adjusted med  . 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 Seizures ([place])    last one was fall of 2016.  Complex  partial sz d/o.  Marland Kitchen Short-term memory loss 10/07/2011   "post brain OR & from his seizures"--worsening 2017, Dr. Krista Blue in neurology doing w/u.  Marland Kitchen Stroke ([place]) 12/2014  . Tobacco abuse   . Urethral stricture since 1981   Recurrent dilation has been required [facility] urology)    Past Surgical History:  Procedure Laterality Date  . BACK SURGERY  11/25/2015  . CARDIAC CATHETERIZATION  2011   [place], records unavailable: pt reports "normal".  . CARDIAC CATHETERIZATION N/A 12/15/2015   Procedure: Left Heart Cath and Coronary Angiography;  Surgeon: Peter M Martinique, MD;  Location: [place] [facility];  Service: Cardiovascular;  Laterality: N/A;  . CARDIAC CATHETERIZATION N/A 12/15/2015   Procedure: Coronary Stent Intervention;  Surgeon: Peter M Martinique, MD;  Location: [place] [facility];  Service: Cardiovascular;  Laterality: N/A;  . COLONOSCOPY W/ POLYPECTOMY  02/2009;02/2014   Recall 5 yr (aden poly, hyperplast polyp, int and ext hemorr).  Next recall is 02/2019.  Marland Kitchen CORONARY ANGIOPLASTY WITH STENT PLACEMENT  2009   3 DES to LCx; still with known distal RCA/PDA occlusion.  EF 60% by LV-gram.  . CRANIOTOMY FOR AVM  1981   Dx'd after pt began having seizures.  . CT ANGIOGRAM (CEREBRAL)  12/31/14   NORMAL--plan per neuro is to repeat this in 3 mo.  Pt states  it was repeated Spring 2017 and was "fine"  . [place] SURGERY  2008; 2009   Dr. Joya Salm  . LUMBAR LAMINECTOMY/DECOMPRESSION MICRODISCECTOMY N/A 11/25/2015   Procedure: Lumbar three- four Lumbar four- five Laminectomy/Foraminotomy;  Surgeon: Leeroy Cha, MD;  Location: [place];  Service: Neurosurgery;  Laterality: N/A;  L3-4 L4-5 Laminectomy/Foraminotomy/possible Lumbar Drain  . TONSILLECTOMY     "I was little"  . TRANSTHORACIC ECHOCARDIOGRAM  12/07/14   Normal LV size/fxn, EF 60%, normal valves.    There were no vitals filed for this visit.       Subjective Assessment - 01/15/16 1128    Subjective The patient presents to OPPT s/p lumbar  surgery on 11/25/15.  In fact, he states this is his third back surgery.  His pain-level today is a 8/10.  His CC is that of pain going into his left hip and pain past his right knee which includes numbness.  Pain meds decrease pain and activity increases pain.   Patient Stated Goals Get rid of my right LE pain.   Currently in Pain? Yes   Pain Score 8    Pain Location Leg   Pain Orientation Left   Pain Descriptors / Indicators Aching;Numbness   Pain Type Chronic pain   Pain Onset More than a month ago   Pain Frequency Constant   Aggravating Factors  See above.            [facility] PT Assessment - 01/15/16 0001      Assessment   Medical Diagnosis Spinal stenosis of lumbar region.   Referring Provider Leeroy Cha MD.   Onset Date/Surgical Date --  11/25/15.     Precautions   Precautions --  Recent lumbar surgery.     Restrictions   Weight Bearing Restrictions No     Balance Screen   Has the patient fallen in the past 6 months No   Has the patient had a decrease in activity level because of a fear of falling?  No   Is the patient reluctant to leave their home because of a fear of falling?  No     Home Ecologist residence     Prior Function   Level of Independence Independent     Posture/Postural Control   Posture/Postural Control Postural limitations   Postural Limitations Decreased lumbar lordosis     ROM / Strength   AROM / PROM / Strength AROM;Strength     AROM   Overall AROM Comments Lumbar flexion limited by 50% and extension to 0 degrees.  Bilateral tight hamstrings with SLR measurements limited to 40 degrees.     Strength   Overall Strength Comments 4/5 strength grade foe left knee extension and left ankle dorsiflexion compared contralaterally.     Palpation   Palpation comment CC is aforementioned left LE pain and numbness to below knee.     Special Tests    Special Tests Lumbar;Leg LengthTest   Lumbar Tests --   Nearnormal LE DTR's;  SLR testing remarkable for tight hams.   Leg length test  --  Equal leg lengths.     Ambulation/Gait   Gait Comments Antalgic gait with patient walking in some spinal flexion.                   [facility] Adult PT Treatment/Exercise - 01/15/16 0001      Modalities   Modalities Electrical Stimulation;Moist Heat     Moist Heat Therapy   Number  Minutes Moist Heat --  15 minutes.   Moist Heat Location Lumbar Spine     Electrical Stimulation   Electrical Stimulation Location Lower lumbar.   Electrical Stimulation Action IFC   Electrical Stimulation Parameters 80-150 Hz x 15 minutes.   Electrical Stimulation Goals Pain                     PT Long Term Goals - 01/15/16 1159      PT LONG TERM GOAL #1   Title Ind with HEP.   Time 6   Period Weeks   Status New     PT LONG TERM GOAL #2   Title Perform ADL's with pain not > 3/10.   Time 6   Period Weeks   Status New     PT LONG TERM GOAL #3   Title Eliminate left LE pain.   Time 6   Period Weeks   Status New               Plan - 01/15/16 1133    Clinical Impression Statement The patient has significant loss of left LE strength and a great deal of pain and numbness into his left LE.  High pain-levels limit his ability to perform ADL's.   Rehab Potential Fair   PT Frequency 2x / week   PT Duration 6 weeks   PT Treatment/Interventions ADLs/Self Care Home Management;Cryotherapy;Electrical Stimulation;Moist Heat;Therapeutic activities;Therapeutic exercise;Patient/family education;Manual techniques   PT Next Visit Plan Right sdly position with 2 pillows between knees...Marland Kitchenperform STW/M to left low back musculature; U/S or Combo e'stim/U/S and progress into a core exercise program..Marland KitchenMarland KitchenLimit extension biased exercises.      Patient will benefit from skilled therapeutic intervention in order to improve the following deficits and impairments:  Pain, Decreased activity tolerance,  Decreased strength, Decreased range of motion  Visit Diagnosis: Chronic left-sided low back pain, with sciatica presence unspecified - Plan: PT plan of care cert/re-cert      G-Codes - [id number]    Functional Assessment Tool Used Clinical judgement.   Functional Limitation Mobility: Walking and moving around   Mobility: Walking and Moving Around Goal Status [phone]) At least 60 percent but less than 80 percent impaired, limited or restricted   Mobility: Walking and Moving Around Discharge Status [phone]) At least 20 percent but less than 40 percent impaired, limited or restricted       Problem List Patient Active Problem List   Diagnosis Date Noted  . NSTEMI (non-ST elevated myocardial infarction) ([place])   . Essential hypertension   . Pure hypercholesterolemia   . Herniated lumbar intervertebral disc 11/27/2015  . Lumbar canal stenosis 11/25/2015  . Left lumbar radiculopathy 06/17/2015  . Migraine 06/17/2015  . Hemorrhage, intracranial ([place]) 01/01/2015  . COPD (chronic obstructive pulmonary disease) ([place]) 12/27/2014  . Intracranial bleeding ([place]) 12/27/2014  . GERD (gastroesophageal reflux disease) 12/27/2014  . Left facial numbness 12/25/2014  . COPD exacerbation ([place]) 12/07/2014  . DOE (dyspnea on exertion) 12/05/2014  . Fever, unspecified 11/29/2014  . Cough 11/28/2014  . Right flank pain 03/28/2014  . Prostate cancer screening 12/05/2013  . 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 Coronary atherosclerosis of native coronary artery 10/25/2013  . HTN (hypertension), benign 08/29/2013  . Hyperlipidemia 08/29/2013  . Venous insufficiency of both lower extremities 08/29/2013  . History of adenomatous polyp of colon 08/29/2013  . Memory loss 10/06/2012  . Seizures ([place]) 10/06/2012  . H/O craniotomy 10/06/2012  . Arm paresthesia, left 10/07/2011  .  Left arm pain 10/07/2011  . 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 Colon polyp 01/14/2011  . H/O  Spinal surgery 01/14/2011  . Cerebral seizure 01/14/2011  . Arteriosclerosis of coronary artery 01/14/2011  . Breath shortness 03/25/2009    Lilac Hoff, [place] MPT 01/15/2016, 12:00 PM  [facility] [address] [place], [place], [address] Phone: [phone]   Fax:  [phone]  Name: REBEL WURTZ MRN: [id number] Date of Birth: 12-Oct-1960

## 2016-01-22 ENCOUNTER — Ambulatory Visit: Payer: Medicare HMO | Admitting: *Deleted

## 2016-01-22 DIAGNOSIS — M545 Low back pain: Secondary | ICD-10-CM | POA: Diagnosis not present

## 2016-01-22 DIAGNOSIS — G8929 Other chronic pain: Secondary | ICD-10-CM

## 2016-01-22 NOTE — Patient Instructions (Signed)

## 2016-01-22 NOTE — Therapy (Addendum)
[facility] [place], [place], [address] Phone: [phone]   Fax:  [phone]  Physical Therapy Treatment  Patient Details  Name: Peter Barnett MRN: [id number] Date of Birth: Jun 30, 1960 Referring Provider: Leeroy Cha MD.  Encounter Date: 01/22/2016    Past Medical History:  Diagnosis Date  . CAD (coronary artery disease)    a. MI/DES to Cx 2009 with staged PCI of LAD with DES and distal RCA with BMS. b. NSTEMI 12/2015 s/p DES to D1, known occlusion of dRCA tx medically.  . Chronic lower back pain    Still with recurrent left sided LBP with paresthesias down left leg --primarily with activities: pain pill use is avg <90 tabs per mo  . COPD (chronic obstructive pulmonary disease) ([place])    NOTED on CT chest 11/2013 done for lung ca screening.  Hosp for acute exac 11/2014.  Spirometry not supportive of this dx as of pulm eval 2017, though.  Spireva trial by Dr. Elsworth Soho 02/2015.  Marland Kitchen GERD (gastroesophageal reflux disease)   . Heart attack 12/12/2015  . History of adenomatous polyp of colon 02/2009;02/2014   Recall 5 yrs ([facility])  . History of kidney stones   . HTN (hypertension)   . Hyperlipidemia   . Intracranial hemorrhage ([place]) 12/27/14   small acute hemorrhage in gliotic medial R frontal lobe  . Memory loss   . MI (myocardial infarction) 2009   "just had arm pain; never any chest pain"  . Migraine syndrome 10/07/2011    a couple per month usually, but worsening 2017 so Dr. Krista Blue adjusted med  . 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 Seizures ([place])    last one was fall of 2016.  Complex partial sz d/o.  Marland Kitchen Short-term memory loss 10/07/2011   "post brain OR & from his seizures"--worsening 2017, Dr. Krista Blue in neurology doing w/u.  Marland Kitchen Stroke ([place]) 12/2014  . Tobacco abuse   . Urethral stricture since 1981   Recurrent dilation has been required [facility] urology)    Past Surgical History:  Procedure  Laterality Date  . BACK SURGERY  11/25/2015  . CARDIAC CATHETERIZATION  2011   [facility], records unavailable: pt reports "normal".  . CARDIAC CATHETERIZATION N/A 12/15/2015   Procedure: Left Heart Cath and Coronary Angiography;  Surgeon: Peter M Martinique, MD;  Location: [place] [facility];  Service: Cardiovascular;  Laterality: N/A;  . CARDIAC CATHETERIZATION N/A 12/15/2015   Procedure: Coronary Stent Intervention;  Surgeon: Peter M Martinique, MD;  Location: [place] [facility];  Service: Cardiovascular;  Laterality: N/A;  . COLONOSCOPY W/ POLYPECTOMY  02/2009;02/2014   Recall 5 yr (aden poly, hyperplast polyp, int and ext hemorr).  Next recall is 02/2019.  Marland Kitchen CORONARY ANGIOPLASTY WITH STENT PLACEMENT  2009   3 DES to LCx; still with known distal RCA/PDA occlusion.  EF 60% by LV-gram.  . CRANIOTOMY FOR AVM  1981   Dx'd after pt began having seizures.  . CT ANGIOGRAM (CEREBRAL)  12/31/14   NORMAL--plan per neuro is to repeat this in 3 mo.  Pt states it was repeated Spring 2017 and was "fine"  . [place] SURGERY  2008; 2009   Dr. Joya Salm  . LUMBAR LAMINECTOMY/DECOMPRESSION MICRODISCECTOMY N/A 11/25/2015   Procedure: Lumbar three- four Lumbar four- five Laminectomy/Foraminotomy;  Surgeon: Leeroy Cha, MD;  Location: [place];  Service: Neurosurgery;  Laterality: N/A;  L3-4 L4-5 Laminectomy/Foraminotomy/possible Lumbar Drain  . TONSILLECTOMY     "I was little"  .  TRANSTHORACIC ECHOCARDIOGRAM  12/07/14   Normal LV size/fxn, EF 60%, normal valves.    There were no vitals filed for this visit.                                    PT Long Term Goals - 01/15/16 1159      PT LONG TERM GOAL #1   Title Ind with HEP.   Time 6   Period Weeks   Status New     PT LONG TERM GOAL #2   Title Perform ADL's with pain not > 3/10.   Time 6   Period Weeks   Status New     PT LONG TERM GOAL #3   Title Eliminate left LE pain.   Time 6   Period Weeks   Status New              Patient will benefit from skilled therapeutic intervention in order to improve the following deficits and impairments:  Pain, Decreased activity tolerance, Decreased strength, Decreased range of motion  Visit Diagnosis: Chronic left-sided low back pain, with sciatica presence unspecified     Problem List Patient Active Problem List   Diagnosis Date Noted  . NSTEMI (non-ST elevated myocardial infarction) ([place])   . Essential hypertension   . Pure hypercholesterolemia   . Herniated lumbar intervertebral disc 11/27/2015  . Lumbar canal stenosis 11/25/2015  . Left lumbar radiculopathy 06/17/2015  . Migraine 06/17/2015  . Hemorrhage, intracranial ([place]) 01/01/2015  . COPD (chronic obstructive pulmonary disease) ([place]) 12/27/2014  . Intracranial bleeding ([place]) 12/27/2014  . GERD (gastroesophageal reflux disease) 12/27/2014  . Left facial numbness 12/25/2014  . COPD exacerbation ([place]) 12/07/2014  . DOE (dyspnea on exertion) 12/05/2014  . Fever, unspecified 11/29/2014  . Cough 11/28/2014  . Right flank pain 03/28/2014  . Prostate cancer screening 12/05/2013  . 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 Coronary atherosclerosis of native coronary artery 10/25/2013  . HTN (hypertension), benign 08/29/2013  . Hyperlipidemia 08/29/2013  . Venous insufficiency of both lower extremities 08/29/2013  . History of adenomatous polyp of colon 08/29/2013  . Memory loss 10/06/2012  . Seizures ([place]) 10/06/2012  . H/O craniotomy 10/06/2012  . Arm paresthesia, left 10/07/2011  . Left arm pain 10/07/2011  . 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 Colon polyp 01/14/2011  . H/O Spinal surgery 01/14/2011  . Cerebral seizure 01/14/2011  . Arteriosclerosis of coronary artery 01/14/2011  . Breath shortness 03/25/2009    APPLEGATE, [place], PTA 01/30/2016, 2:20 PM  [facility] [place], [place],  [address] Phone: [phone]   Fax:  [phone]  Name: Peter Barnett MRN: [id number] Date of Birth: Jul 06, 1960  PHYSICAL THERAPY DISCHARGE SUMMARY  Visits from Start of Care: 2.  Current functional level related to goals / functional outcomes: See above.   Remaining deficits: See below.   Education / Equipment: HEP. Plan: Patient agrees to discharge.  Patient goals were not met. Patient is being discharged due to the physician's request.  ?????         [place] Applegate MPT

## 2016-01-29 ENCOUNTER — Encounter: Payer: Medicare HMO | Admitting: *Deleted

## 2016-02-03 ENCOUNTER — Other Ambulatory Visit: Payer: Self-pay | Admitting: Family Medicine

## 2016-02-06 ENCOUNTER — Other Ambulatory Visit: Payer: Medicare HMO | Admitting: *Deleted

## 2016-02-06 DIAGNOSIS — I214 Non-ST elevation (NSTEMI) myocardial infarction: Secondary | ICD-10-CM | POA: Diagnosis not present

## 2016-02-06 DIAGNOSIS — E785 Hyperlipidemia, unspecified: Secondary | ICD-10-CM

## 2016-02-06 LAB — COMPREHENSIVE METABOLIC PANEL
ALBUMIN: 4.2 g/dL (ref 3.6–5.1)
ALK PHOS: 111 U/L (ref 40–115)
ALT: 32 U/L (ref 9–46)
AST: 17 U/L (ref 10–35)
BUN: 12 mg/dL (ref 7–25)
CALCIUM: 9.2 mg/dL (ref 8.6–10.3)
CHLORIDE: 106 mmol/L (ref 98–110)
CO2: 21 mmol/L (ref 20–31)
Creat: 0.9 mg/dL (ref 0.70–1.33)
Glucose, Bld: 101 mg/dL — ABNORMAL HIGH (ref 65–99)
POTASSIUM: 4.2 mmol/L (ref 3.5–5.3)
Sodium: 137 mmol/L (ref 135–146)
TOTAL PROTEIN: 6.6 g/dL (ref 6.1–8.1)
Total Bilirubin: 0.4 mg/dL (ref 0.2–1.2)

## 2016-02-06 LAB — LIPID PANEL
CHOL/HDL RATIO: 3.5 ratio (ref <5.0)
CHOLESTEROL: 149 mg/dL (ref <200)
HDL: 43 mg/dL (ref >40)
LDL Cholesterol: 86 mg/dL (ref <100)
TRIGLYCERIDES: 99 mg/dL (ref <150)
VLDL: 20 mg/dL (ref <30)

## 2016-02-10 ENCOUNTER — Telehealth: Payer: Self-pay | Admitting: Physician Assistant

## 2016-02-10 NOTE — Telephone Encounter (Signed)
Spoke to pt and then pt gave his wife the phone. They have both been made aware of Dayna Dunn's recommendation re: seeing the Lipid clinic to discuss addition of Zetia for pt cholesterol. Pt wife stated that pt has not been eating like he should and that she needed to get him back to walking to see if they can get it down. She advised that she has health issues as well and that they couldn't afford much more expense. Pt and wife have been advised that we could recommend, but the choice was theirs, but it was important for his wellbeing as well to get his LDL down. They verbalized understanding and said they would call back if they decided to see lipid clinic.

## 2016-02-10 NOTE — Telephone Encounter (Signed)
New message ° ° ° ° °Returning a call to the nurse to get lab results °

## 2016-03-10 ENCOUNTER — Ambulatory Visit (INDEPENDENT_AMBULATORY_CARE_PROVIDER_SITE_OTHER): Payer: Medicare HMO | Admitting: Physician Assistant

## 2016-03-10 ENCOUNTER — Encounter: Payer: Self-pay | Admitting: Physician Assistant

## 2016-03-10 VITALS — BP 120/80 | HR 70 | Temp 98.5°F | Resp 16 | Ht 71.0 in | Wt 255.0 lb

## 2016-03-10 DIAGNOSIS — J101 Influenza due to other identified influenza virus with other respiratory manifestations: Secondary | ICD-10-CM

## 2016-03-10 LAB — POCT INFLUENZA A/B
INFLUENZA A, POC: POSITIVE — AB
Influenza B, POC: NEGATIVE

## 2016-03-10 MED ORDER — OSELTAMIVIR PHOSPHATE 75 MG PO CAPS: 75.0000 mg | ORAL_CAPSULE | Freq: Two times a day (BID) | ORAL | 0 refills | Status: DC

## 2016-03-10 NOTE — Progress Notes (Signed)
Patient presents to clinic today c/o 2 days of sore throat, chills and aches.  Endorses low-grade fever at 99. Endorses watery eyes, runny nose, cough and sneezing. Endorses some mild headache. Denies ear pain, tooth pain or facial pain. Denies SOB. Patient with history of COPD. Is taking chronic medications as directed. Denies recent travel. Denies sick contact. Has had flu shot this year. Has not used albuterol inhaler.   Past Medical History:  Diagnosis Date  . CAD (coronary artery disease)    a. MI/DES to Cx 2009 with staged PCI of LAD with DES and distal RCA with BMS. b. NSTEMI 12/2015 s/p DES to D1, known occlusion of dRCA tx medically.  . Chronic lower back pain    Still with recurrent left sided LBP with paresthesias down left leg --primarily with activities: pain pill use is avg <90 tabs per mo  . COPD (chronic obstructive pulmonary disease) ([place])    NOTED on CT chest 11/2013 done for lung ca screening.  Hosp for acute exac 11/2014.  Spirometry not supportive of this dx as of pulm eval 2017, though.  Spireva trial by Dr. Elsworth Soho 02/2015.  Marland Kitchen GERD (gastroesophageal reflux disease)   . Heart attack 12/12/2015  . History of adenomatous polyp of colon 02/2009;02/2014   Recall 5 yrs ([facility])  . History of kidney stones   . HTN (hypertension)   . Hyperlipidemia   . Intracranial hemorrhage ([place]) 12/27/14   small acute hemorrhage in gliotic medial R frontal lobe  . Memory loss   . MI (myocardial infarction) 2009   "just had arm pain; never any chest pain"  . Migraine syndrome 10/07/2011    a couple per month usually, but worsening 2017 so Dr. Krista Blue adjusted med  . 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 Seizures ([place])    last one was fall of 2016.  Complex partial sz d/o.  Marland Kitchen Short-term memory loss 10/07/2011   "post brain OR & from his seizures"--worsening 2017, Dr. Krista Blue in neurology doing w/u.  Marland Kitchen Stroke ([place]) 12/2014  . Tobacco abuse   . Urethral stricture since 1981     Recurrent dilation has been required [facility] urology)    Current Outpatient Prescriptions on File Prior to Visit  Medication Sig Dispense Refill  . acetaminophen (TYLENOL) 500 MG tablet Take 1 tablet (500 mg total) by mouth every 6 (six) hours as needed for headache. 30 tablet 0  . albuterol (PROVENTIL HFA;VENTOLIN HFA) 108 (90 BASE) MCG/ACT inhaler Inhale 2 puffs into the lungs every 6 (six) hours as needed for wheezing or shortness of breath. 1 Inhaler 2  . aspirin EC 81 MG tablet Take 81 mg by mouth daily.    . butalbital-acetaminophen-caffeine (FIORICET, ESGIC) 50-325-40 MG tablet Take 1 tablet by mouth every 6 (six) hours as needed for headache. 12 tablet 5  . Cholecalciferol 1000 units tablet Take 1,000 Units by mouth daily.    . clopidogrel (PLAVIX) 75 MG tablet Take 4 tablets by mouth daily on day 1 then take 1 tablet by mouth daily thereafter 94 tablet 3  . docusate sodium (COLACE) 100 MG capsule Take 200 mg by mouth daily.     . famotidine (PEPCID) 20 MG tablet Take 20 mg by mouth daily.    Marland Kitchen HYDROcodone-acetaminophen (NORCO) 10-325 MG per tablet Take 1 tablet by mouth every 6 (six) hours as needed (pain).     . isosorbide mononitrate (IMDUR) 30 MG 24 hr tablet Take 1 tablet (30 mg total) by  mouth daily. 30 tablet 6  . metoprolol tartrate (LOPRESSOR) 25 MG tablet TAKE 1/2 TABLET BY MOUTH TWICE DAILY 30 tablet 2  . nitroGLYCERIN (NITROSTAT) 0.4 MG SL tablet Place 1 tablet (0.4 mg total) under the tongue every 5 (five) minutes as needed for chest pain (CP or SOB). 25 tablet 3  . oxcarbazepine (TRILEPTAL) 600 MG tablet Take 1 tablet (600 mg total) by mouth 2 (two) times daily. 180 tablet 4  . rosuvastatin (CRESTOR) 40 MG tablet Take 1 tablet (40 mg total) by mouth daily at 6 PM. 30 tablet 6   No current facility-administered medications on file prior to visit.     Allergies  Allergen Reactions  . Tramadol Other (See Comments)    SEIZURES "Staring off spells"  . Atorvastatin  Other (See Comments)    MYALGIAS    Family History  Problem Relation Age of Onset  . Hyperlipidemia Mother   . Cancer Father     Mesothelioma  . Hypertension Father   . Asthma Father   . Heart attack Paternal Uncle     In 91s too  . Stroke Neg Hx     Social History   Social History  . Marital status: Married    Spouse name: [name]  . Number of children: 2  . Years of education: 10 th   Occupational History  . retired  Retired    retired   Social History Main Topics  . Smoking status: Former Smoker    Packs/day: 3.00    Years: 27.00    Types: Cigarettes    Quit date: 02/08/2001  . Smokeless tobacco: Never Used  . Alcohol use 4.2 oz/week    7 Cans of beer per week     Comment: 12 oz beer   . Drug use: No  . Sexual activity: Yes   Other Topics Concern  . None   Social History Narrative   Patient lives at home with his wife [name]).   Two adult children.   Retired from [employer] approx age 89 due to medical reasons (disabled due to memory issues, seizure d/o and chronic LBP).   Education 10th grade.   Right handed.   Caffeine two cups of tea and two cups of coffee.   Tobacco 45 pack-yr hx, quit approx age 23.  Alcohol: 1-2 beers per night.     No hx of alc or drug problems.   No exercise.   Diet: regular    Review of Systems - See HPI.  All other ROS are negative.  BP 120/80   Pulse 70   Temp 98.5 F (36.9 C) (Oral)   Resp 16   Ht '5\' 11"'$  (1.803 m)   Wt 255 lb (115.7 kg)   SpO2 98%   BMI 35.57 kg/m   Physical Exam  Constitutional: He is well-developed, well-nourished, and in no distress.  HENT:  Head: Normocephalic and atraumatic.  Right Ear: External ear normal.  Left Ear: External ear normal.  Nose: Nose normal.  Mouth/Throat: Oropharynx is clear and moist. No oropharyngeal exudate.  Eyes: Conjunctivae are normal.  Cardiovascular: Normal rate, regular rhythm, normal heart sounds and intact distal pulses.   Pulmonary/Chest: Effort normal and  breath sounds normal. No respiratory distress. He has no wheezes. He has no rales. He exhibits no tenderness.  Skin: Skin is warm and dry. No rash noted.  Vitals reviewed.   Recent Results (from the past 2160 hour(s))  CBC     Status: None  Collection Time: 12/12/15  4:45 PM  Result Value Ref Range   WBC 10.4 4.0 - 10.5 K/uL   RBC 4.89 4.22 - 5.81 MIL/uL   Hemoglobin 14.8 13.0 - 17.0 g/dL   HCT 42.8 39.0 - 52.0 %   MCV 87.5 78.0 - 100.0 fL   MCH 30.3 26.0 - 34.0 pg   MCHC 34.6 30.0 - 36.0 g/dL   RDW 13.1 11.5 - 15.5 %   Platelets 176 150 - 400 K/uL  Comprehensive metabolic panel     Status: Abnormal   Collection Time: 12/12/15  4:45 PM  Result Value Ref Range   Sodium 136 135 - 145 mmol/L   Potassium 4.1 3.5 - 5.1 mmol/L   Chloride 104 101 - 111 mmol/L   CO2 23 22 - 32 mmol/L   Glucose, Bld 236 (H) 65 - 99 mg/dL   BUN 11 6 - 20 mg/dL   Creatinine, Ser 1.04 0.61 - 1.24 mg/dL   Calcium 9.1 8.9 - 10.3 mg/dL   Total Protein 6.6 6.5 - 8.1 g/dL   Albumin 3.9 3.5 - 5.0 g/dL   AST 24 15 - 41 U/L   ALT 33 17 - 63 U/L   Alkaline Phosphatase 92 38 - 126 U/L   Total Bilirubin 0.4 0.3 - 1.2 mg/dL   GFR calc non Af Amer >60 >60 mL/min   GFR calc Af Amer >60 >60 mL/min    Comment: (NOTE) The eGFR has been calculated using the CKD EPI equation. This calculation has not been validated in all clinical situations. eGFR's persistently <60 mL/min signify possible Chronic Kidney Disease.    Anion gap 9 5 - 15  I-stat troponin, ED (0, 3, 6)  not at [facility], [facility]     Status: Abnormal   Collection Time: 12/12/15  4:52 PM  Result Value Ref Range   Troponin i, poc 0.12 (HH) 0.00 - 0.08 ng/mL   Comment NOTIFIED PHYSICIAN    Comment 3            Comment: Due to the release kinetics of cTnI, a negative result within the first hours of the onset of symptoms does not rule out myocardial infarction with certainty. If myocardial infarction is still suspected, repeat the test at appropriate  intervals.   I-Stat Chem 8, ED  (not at [facility], [facility])     Status: Abnormal   Collection Time: 12/12/15  4:53 PM  Result Value Ref Range   Sodium 137 135 - 145 mmol/L   Potassium 4.8 3.5 - 5.1 mmol/L   Chloride 104 101 - 111 mmol/L   BUN 16 6 - 20 mg/dL   Creatinine, Ser 0.90 0.61 - 1.24 mg/dL   Glucose, Bld 230 (H) 65 - 99 mg/dL   Calcium, Ion 1.11 (L) 1.15 - 1.40 mmol/L   TCO2 24 0 - 100 mmol/L   Hemoglobin 15.0 13.0 - 17.0 g/dL   HCT 44.0 39.0 - 52.0 %  Troponin I     Status: Abnormal   Collection Time: 12/12/15  5:17 PM  Result Value Ref Range   Troponin I 0.09 (HH) <0.03 ng/mL    Comment: CRITICAL RESULT CALLED TO, READ BACK BY AND VERIFIED WITH: Revonda Humphrey RN [phone] 1906 GREEN R   Hemoglobin A1c     Status: None   Collection Time: 12/12/15  8:21 PM  Result Value Ref Range   Hgb A1c MFr Bld 5.6 4.8 - 5.6 %    Comment: (NOTE)  Pre-diabetes: 5.7 - 6.4         Diabetes: >6.4         Glycemic control for adults with diabetes: <7.0    Mean Plasma Glucose 114 mg/dL    Comment: (NOTE) Performed At: [facility] [place], [place] [id number] Lindon Romp MD [place]:[id number]   Heparin level (unfractionated)     Status: Abnormal   Collection Time: 12/13/15  1:17 AM  Result Value Ref Range   Heparin Unfractionated 0.24 (L) 0.30 - 0.70 IU/mL    Comment:        IF HEPARIN RESULTS ARE BELOW EXPECTED VALUES, AND PATIENT DOSAGE HAS BEEN CONFIRMED, SUGGEST FOLLOW UP TESTING OF ANTITHROMBIN III LEVELS.   Basic metabolic panel     Status: Abnormal   Collection Time: 12/13/15  1:17 AM  Result Value Ref Range   Sodium 136 135 - 145 mmol/L   Potassium 4.4 3.5 - 5.1 mmol/L   Chloride 105 101 - 111 mmol/L   CO2 22 22 - 32 mmol/L   Glucose, Bld 152 (H) 65 - 99 mg/dL   BUN 10 6 - 20 mg/dL   Creatinine, Ser 0.80 0.61 - 1.24 mg/dL   Calcium 9.1 8.9 - 10.3 mg/dL   GFR calc non Af Amer >60 >60 mL/min   GFR calc Af Amer >60 >60 mL/min    Comment: (NOTE) The  eGFR has been calculated using the CKD EPI equation. This calculation has not been validated in all clinical situations. eGFR's persistently <60 mL/min signify possible Chronic Kidney Disease.    Anion gap 9 5 - 15  CBC     Status: Abnormal   Collection Time: 12/13/15  1:17 AM  Result Value Ref Range   WBC 12.6 (H) 4.0 - 10.5 K/uL   RBC 4.83 4.22 - 5.81 MIL/uL   Hemoglobin 14.4 13.0 - 17.0 g/dL   HCT 42.6 39.0 - 52.0 %   MCV 88.2 78.0 - 100.0 fL   MCH 29.8 26.0 - 34.0 pg   MCHC 33.8 30.0 - 36.0 g/dL   RDW 13.0 11.5 - 15.5 %   Platelets 272 150 - 400 K/uL  Lipid panel     Status: Abnormal   Collection Time: 12/13/15  1:17 AM  Result Value Ref Range   Cholesterol 244 (H) 0 - 200 mg/dL   Triglycerides 94 <150 mg/dL   HDL 56 >40 mg/dL   Total CHOL/HDL Ratio 4.4 RATIO   VLDL 19 0 - 40 mg/dL   LDL Cholesterol 169 (H) 0 - 99 mg/dL    Comment:        Total Cholesterol/HDL:CHD Risk Coronary Heart Disease Risk Table                     Men   Women  1/2 Average Risk   3.4   3.3  Average Risk       5.0   4.4  2 X Average Risk   9.6   7.1  3 X Average Risk  23.4   11.0        Use the calculated Patient Ratio above and the CHD Risk Table to determine the patient's CHD Risk.        ATP III CLASSIFICATION (LDL):  <100     mg/dL   Optimal  100-129  mg/dL   Near or Above  Optimal  130-159  mg/dL   Borderline  021-897  mg/dL   High  >336     mg/dL   Very High   Troponin I (q 6hr x 3)     Status: Abnormal   Collection Time: 12/13/15  6:04 AM  Result Value Ref Range   Troponin I 7.91 (HH) <0.03 ng/mL    Comment: CRITICAL VALUE NOTED.  VALUE IS CONSISTENT WITH PREVIOUSLY REPORTED AND CALLED VALUE. CRITICAL RESULT CALLED TO, READ BACK BY AND VERIFIED WITH: MIKE WEST,RN AT 1711 12/13/15 BY ZBEECH.   Heparin level (unfractionated)     Status: None   Collection Time: 12/13/15 10:38 AM  Result Value Ref Range   Heparin Unfractionated 0.46 0.30 - 0.70 IU/mL     Comment:        IF HEPARIN RESULTS ARE BELOW EXPECTED VALUES, AND PATIENT DOSAGE HAS BEEN CONFIRMED, SUGGEST FOLLOW UP TESTING OF ANTITHROMBIN III LEVELS.   Troponin I (q 6hr x 3)     Status: Abnormal   Collection Time: 12/13/15 10:38 AM  Result Value Ref Range   Troponin I 4.79 (HH) <0.03 ng/mL    Comment: CRITICAL VALUE NOTED.  VALUE IS CONSISTENT WITH PREVIOUSLY REPORTED AND CALLED VALUE.  Troponin I (q 6hr x 3)     Status: Abnormal   Collection Time: 12/13/15  4:44 PM  Result Value Ref Range   Troponin I 3.12 (HH) <0.03 ng/mL    Comment: CRITICAL VALUE NOTED.  VALUE IS CONSISTENT WITH PREVIOUSLY REPORTED AND CALLED VALUE.  Heparin level (unfractionated)     Status: None   Collection Time: 12/14/15  1:34 AM  Result Value Ref Range   Heparin Unfractionated 0.45 0.30 - 0.70 IU/mL    Comment:        IF HEPARIN RESULTS ARE BELOW EXPECTED VALUES, AND PATIENT DOSAGE HAS BEEN CONFIRMED, SUGGEST FOLLOW UP TESTING OF ANTITHROMBIN III LEVELS.   Basic metabolic panel     Status: Abnormal   Collection Time: 12/14/15  1:35 AM  Result Value Ref Range   Sodium 135 135 - 145 mmol/L   Potassium 3.7 3.5 - 5.1 mmol/L   Chloride 101 101 - 111 mmol/L   CO2 25 22 - 32 mmol/L   Glucose, Bld 111 (H) 65 - 99 mg/dL   BUN 14 6 - 20 mg/dL   Creatinine, Ser 1.87 0.61 - 1.24 mg/dL   Calcium 9.0 8.9 - 48.9 mg/dL   GFR calc non Af Amer >60 >60 mL/min   GFR calc Af Amer >60 >60 mL/min    Comment: (NOTE) The eGFR has been calculated using the CKD EPI equation. This calculation has not been validated in all clinical situations. eGFR's persistently <60 mL/min signify possible Chronic Kidney Disease.    Anion gap 9 5 - 15  CBC     Status: Abnormal   Collection Time: 12/14/15  1:35 AM  Result Value Ref Range   WBC 11.4 (H) 4.0 - 10.5 K/uL   RBC 4.45 4.22 - 5.81 MIL/uL   Hemoglobin 13.6 13.0 - 17.0 g/dL   HCT 73.5 72.4 - 24.2 %   MCV 87.9 78.0 - 100.0 fL   MCH 30.6 26.0 - 34.0 pg   MCHC 34.8  30.0 - 36.0 g/dL   RDW 44.4 53.6 - 76.1 %   Platelets 217 150 - 400 K/uL  Protime-INR     Status: None   Collection Time: 12/14/15  9:22 PM  Result Value Ref Range   Prothrombin Time 13.0  11.4 - 15.2 seconds   INR 0.98   Heparin level (unfractionated)     Status: None   Collection Time: 12/15/15  3:17 AM  Result Value Ref Range   Heparin Unfractionated 0.35 0.30 - 0.70 IU/mL    Comment:        IF HEPARIN RESULTS ARE BELOW EXPECTED VALUES, AND PATIENT DOSAGE HAS BEEN CONFIRMED, SUGGEST FOLLOW UP TESTING OF ANTITHROMBIN III LEVELS.   POCT Activated clotting time     Status: None   Collection Time: 12/15/15  9:02 AM  Result Value Ref Range   Activated Clotting Time 307 seconds  POCT Activated clotting time     Status: None   Collection Time: 12/15/15  9:19 AM  Result Value Ref Range   Activated Clotting Time 252 seconds  Basic metabolic panel     Status: None   Collection Time: 12/16/15  5:02 AM  Result Value Ref Range   Sodium 139 135 - 145 mmol/L   Potassium 4.1 3.5 - 5.1 mmol/L   Chloride 104 101 - 111 mmol/L   CO2 23 22 - 32 mmol/L   Glucose, Bld 84 65 - 99 mg/dL   BUN 9 6 - 20 mg/dL   Creatinine, Ser 0.83 0.61 - 1.24 mg/dL   Calcium 9.1 8.9 - 10.3 mg/dL   GFR calc non Af Amer >60 >60 mL/min   GFR calc Af Amer >60 >60 mL/min    Comment: (NOTE) The eGFR has been calculated using the CKD EPI equation. This calculation has not been validated in all clinical situations. eGFR's persistently <60 mL/min signify possible Chronic Kidney Disease.    Anion gap 12 5 - 15  CBC     Status: None   Collection Time: 12/16/15  5:02 AM  Result Value Ref Range   WBC 9.1 4.0 - 10.5 K/uL   RBC 4.61 4.22 - 5.81 MIL/uL   Hemoglobin 13.6 13.0 - 17.0 g/dL   HCT 40.8 39.0 - 52.0 %   MCV 88.5 78.0 - 100.0 fL   MCH 29.5 26.0 - 34.0 pg   MCHC 33.3 30.0 - 36.0 g/dL   RDW 13.3 11.5 - 15.5 %   Platelets 188 150 - 400 K/uL  Comp Met (CMET)     Status: Abnormal   Collection Time: 02/06/16   7:49 AM  Result Value Ref Range   Sodium 137 135 - 146 mmol/L   Potassium 4.2 3.5 - 5.3 mmol/L   Chloride 106 98 - 110 mmol/L   CO2 21 20 - 31 mmol/L   Glucose, Bld 101 (H) 65 - 99 mg/dL   BUN 12 7 - 25 mg/dL   Creat 0.90 0.70 - 1.33 mg/dL    Comment:   For patients > or = 57 years of age: The upper reference limit for Creatinine is approximately 13% higher for people identified as African-American.      Total Bilirubin 0.4 0.2 - 1.2 mg/dL   Alkaline Phosphatase 111 40 - 115 U/L   AST 17 10 - 35 U/L   ALT 32 9 - 46 U/L   Total Protein 6.6 6.1 - 8.1 g/dL   Albumin 4.2 3.6 - 5.1 g/dL   Calcium 9.2 8.6 - 10.3 mg/dL  Lipid panel     Status: None   Collection Time: 02/06/16  7:49 AM  Result Value Ref Range   Cholesterol 149 <200 mg/dL   Triglycerides 99 <150 mg/dL   HDL 43 >40 mg/dL   Total CHOL/HDL Ratio 3.5 <  5.0 Ratio   VLDL 20 <30 mg/dL   LDL Cholesterol 86 <100 mg/dL    Assessment/Plan: 1. Influenza A Flu swab + Flu A. Start Tamiflu. Supportive measures and OTC medications reviewed.  - POCT Influenza A/B    Leeanne Rio, PA-C

## 2016-03-10 NOTE — Patient Instructions (Signed)
Please stay well hydrated and get plenty for rest. Place a humidifier in the bedroom. Delsym for cough. Take Tamiflu as directed. Use your albuterol as directed if needed.  If you note any worsening symptoms, please return to office immediately.    Influenza, Adult Influenza, more commonly known as "the flu," is a viral infection that primarily affects the respiratory tract. The respiratory tract includes organs that help you breathe, such as the lungs, nose, and throat. The flu causes many common cold symptoms, as well as a high fever and body aches. The flu spreads easily from person to person (is contagious). Getting a flu shot (influenza vaccination) every year is the best way to prevent influenza. What are the causes? Influenza is caused by a virus. You can catch the virus by:  Breathing in droplets from an infected person's cough or sneeze.  Touching something that was recently contaminated with the virus and then touching your mouth, nose, or eyes. What increases the risk? The following factors may make you more likely to get the flu:  Not cleaning your hands frequently with soap and water or alcohol-based hand sanitizer.  Having close contact with many people during cold and flu season.  Touching your mouth, eyes, or nose without washing or sanitizing your hands first.  Not drinking enough fluids or not eating a healthy diet.  Not getting enough sleep or exercise.  Being under a high amount of stress.  Not getting a yearly (annual) flu shot. You may be at a higher risk of complications from the flu, such as a severe lung infection (pneumonia), if you:  Are over the age of 29.  Are pregnant.  Have a weakened disease-fighting system (immune system). You may have a weakened immune system if you:  Have HIV or AIDS.  Are undergoing chemotherapy.  Aretaking medicines that reduce the activity of (suppress) the immune system.  Have a long-term (chronic) illness, such  as heart disease, kidney disease, diabetes, or lung disease.  Have a liver disorder.  Are obese.  Have anemia. What are the signs or symptoms? Symptoms of this condition typically last 4-10 days and may include:  Fever.  Chills.  Headache, body aches, or muscle aches.  Sore throat.  Cough.  Runny or congested nose.  Chest discomfort and cough.  Poor appetite.  Weakness or tiredness (fatigue).  Dizziness.  Nausea or vomiting. How is this diagnosed? This condition may be diagnosed based on your medical history and a physical exam. Your health care provider may do a nose or throat swab test to confirm the diagnosis. How is this treated? If influenza is detected early, you can be treated with antiviral medicine that can reduce the length of your illness and the severity of your symptoms. This medicine may be given by mouth (orally) or through an IV tube that is inserted in one of your veins. The goal of treatment is to relieve symptoms by taking care of yourself at home. This may include taking over-the-counter medicines, drinking plenty of fluids, and adding humidity to the air in your home. In some cases, influenza goes away on its own. Severe influenza or complications from influenza may be treated in a hospital. Follow these instructions at home:  Take over-the-counter and prescription medicines only as told by your health care provider.  Use a cool mist humidifier to add humidity to the air in your home. This can make breathing easier.  Rest as needed.  Drink enough fluid to keep your urine  clear or pale yellow.  Cover your mouth and nose when you cough or sneeze.  Wash your hands with soap and water often, especially after you cough or sneeze. If soap and water are not available, use hand sanitizer.  Stay home from work or school as told by your health care provider. Unless you are visiting your health care provider, try to avoid leaving home until your fever has  been gone for 24 hours without the use of medicine.  Keep all follow-up visits as told by your health care provider. This is important. How is this prevented?  Getting an annual flu shot is the best way to avoid getting the flu. You may get the flu shot in late summer, fall, or winter. Ask your health care provider when you should get your flu shot.  Wash your hands often or use hand sanitizer often.  Avoid contact with people who are sick during cold and flu season.  Eat a healthy diet, drink plenty of fluids, get enough sleep, and exercise regularly. Contact a health care provider if:  You develop new symptoms.  You have:  Chest pain.  Diarrhea.  A fever.  Your cough gets worse.  You produce more mucus.  You feel nauseous or you vomit. Get help right away if:  You develop shortness of breath or difficulty breathing.  Your skin or nails turn a bluish color.  You have severe pain or stiffness in your neck.  You develop a sudden headache or sudden pain in your face or ear.  You cannot stop vomiting. This information is not intended to replace advice given to you by your health care provider. Make sure you discuss any questions you have with your health care provider. Document Released: 01/23/2000 Document Revised: 07/03/2015 Document Reviewed: 11/19/2014 Elsevier Interactive Patient Education  2017 [employer].

## 2016-03-10 NOTE — Progress Notes (Signed)
Pre visit review using our clinic review tool, if applicable. No additional management support is needed unless otherwise documented below in the visit note. 

## 2016-03-30 ENCOUNTER — Telehealth (HOSPITAL_COMMUNITY): Payer: Self-pay | Admitting: Family Medicine

## 2016-03-30 ENCOUNTER — Encounter (HOSPITAL_COMMUNITY): Payer: Self-pay | Admitting: Family Medicine

## 2016-03-30 NOTE — Telephone Encounter (Signed)
Mailed letter with [facility] along with my chart message... KJ  °

## 2016-04-01 ENCOUNTER — Ambulatory Visit: Payer: Medicare HMO | Admitting: Interventional Cardiology

## 2016-04-06 ENCOUNTER — Ambulatory Visit: Payer: Medicare HMO | Admitting: Neurology

## 2016-05-04 IMAGING — CT CT HEAD WO/W CM
1 of 2 series · 13 of 30 positions shown, 17 images · IV contrast (omnipaque)
Comparison: 10/17/2007

CLINICAL DATA: Generalized seizure 12/24/2014. Left facial
numbness. Remote brain surgery for AVM. Headache.

EXAM:
CT HEAD WITHOUT AND WITH CONTRAST
TECHNIQUE: Contiguous axial images were obtained from the base of the skull
through the vertex without and with intravenous contrast
CONTRAST:  75mL OMNIPAQUE IOHEXOL 300 MG/ML  SOLN

[Series 2: head w/o 4.8 h37s · axial · non-contrast · 0.59mm/px · z∈[-134,+15]mm · 13 of 36 slices shown, 17 images]
[im 3/36  brain]
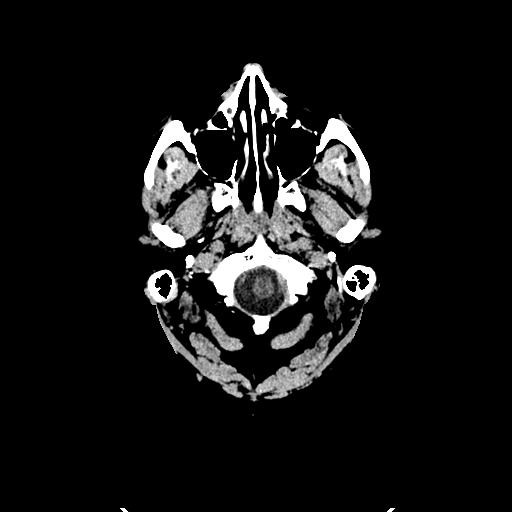
[im 3/36  bone]
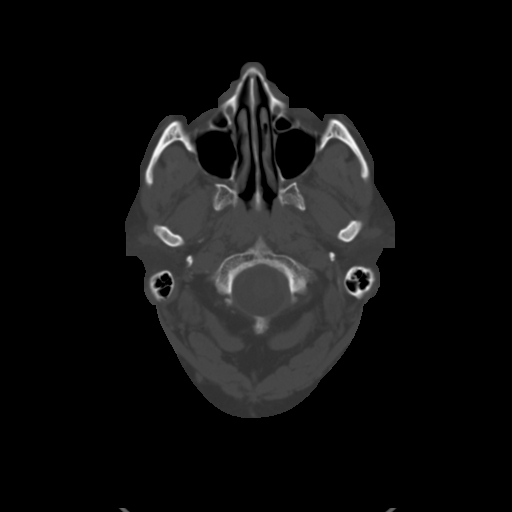
[im 6/36  brain]
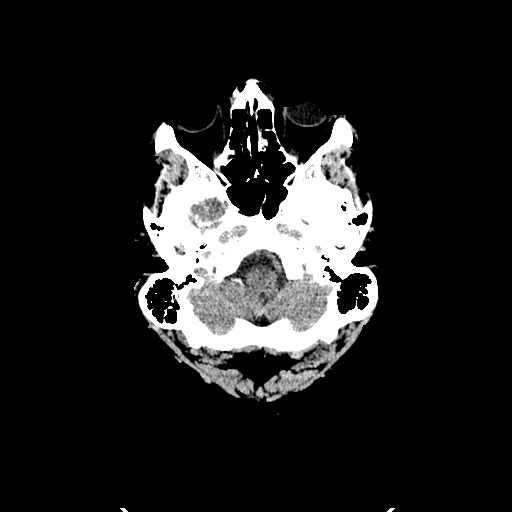
[im 8/36  brain]
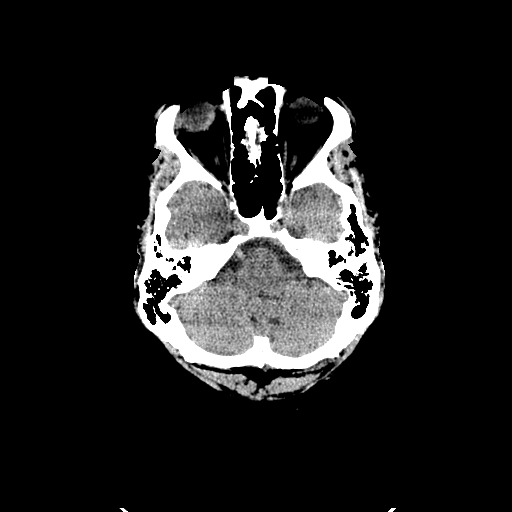
[im 11/36  brain]
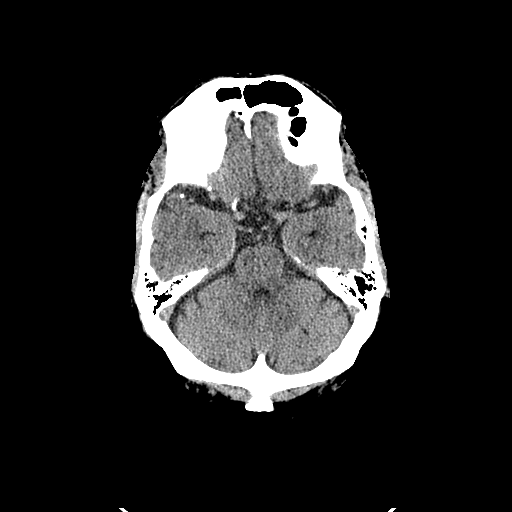
[im 13/36  brain]
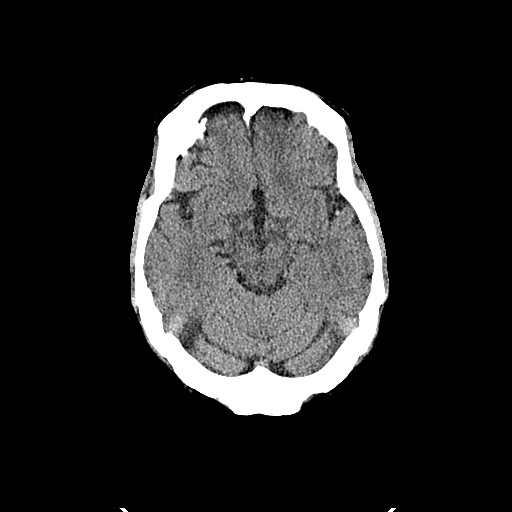
[im 13/36  bone]
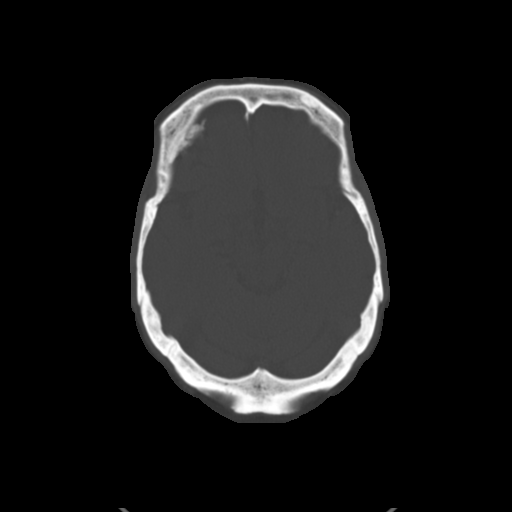
[im 16/36  brain]
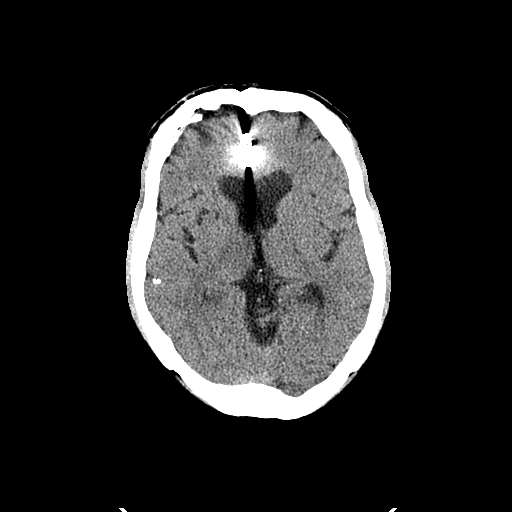
[im 18/36  brain]
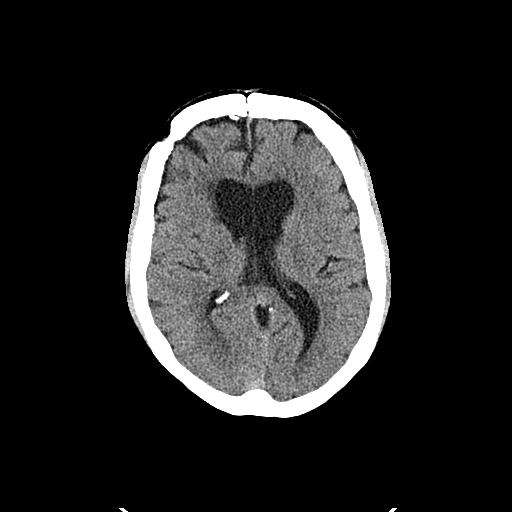
[im 21/36  brain]
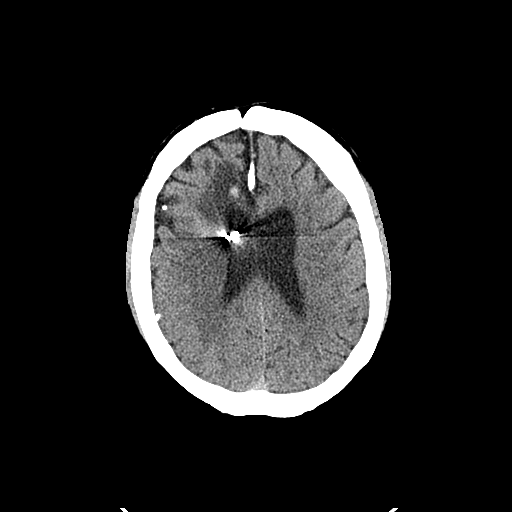
[im 23/36  brain]
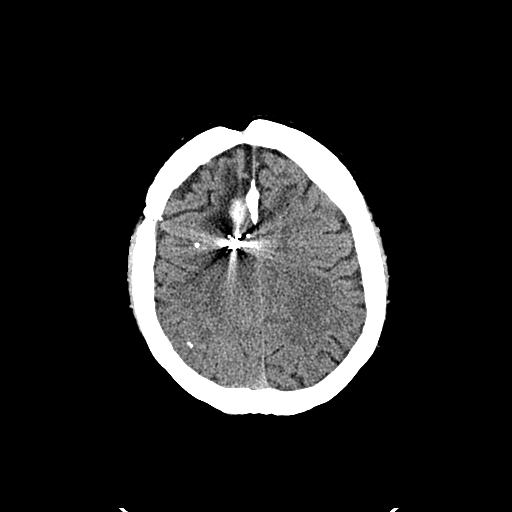
[im 23/36  bone]
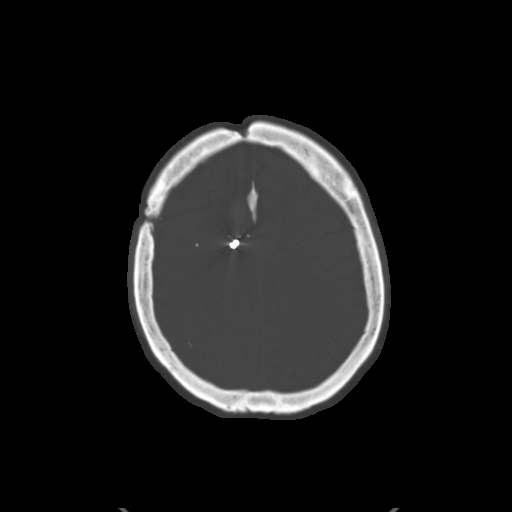
[im 26/36  brain]
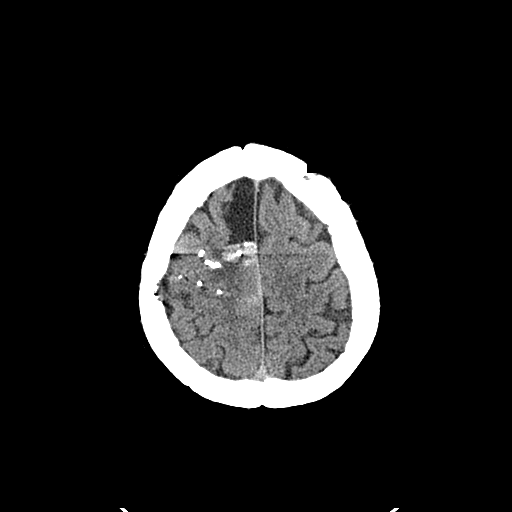
[im 28/36  brain]
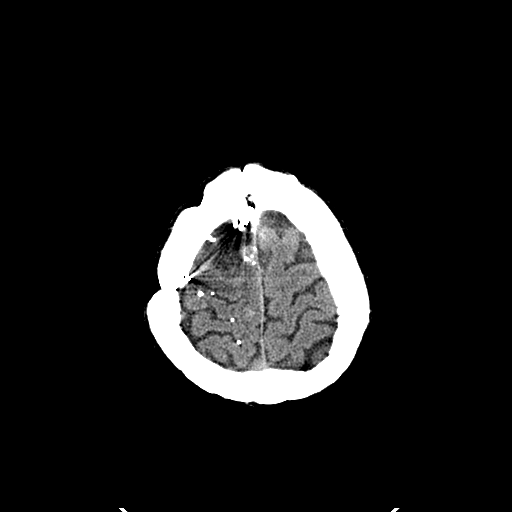
[im 31/36  brain]
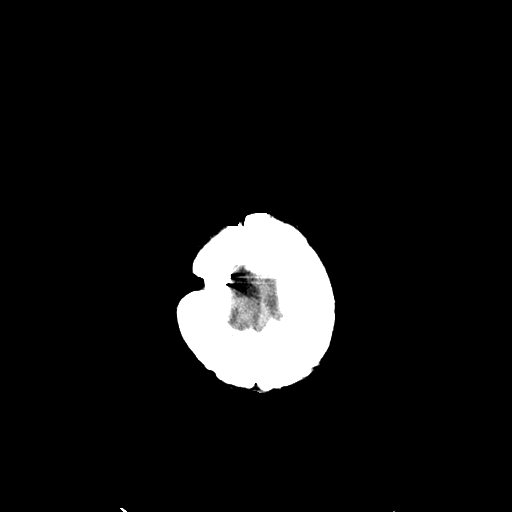
[im 33/36  brain]
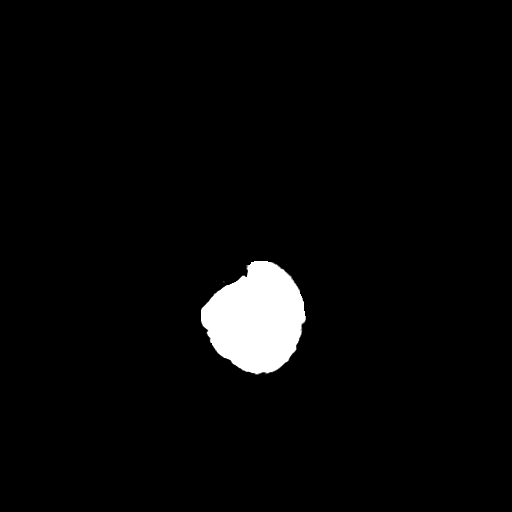
[im 33/36  bone]
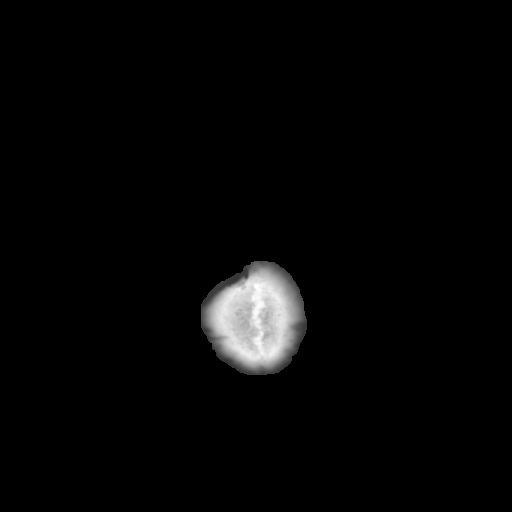

[13 of 30 positions shown; findings below may reference images not displayed]

FINDINGS: Sequelae of right frontal craniotomy are again identified presumably
for treatment of an AVM given history. Multiple aneurysm clips are
again seen as well as additional punctate radiopaque densities
throughout the right cerebral hemisphere, also likely treatment
related.

There is an oblong focus of hyperattenuation in an area of gliotic
medial right frontal lobe anterior and superior to the right lateral
ventricle which is new from the prior study and consistent with
acute hemorrhage. This measures up to 11 mm in transverse thickness
and extends over a craniocaudal length of at least 2 cm, with
evaluation limited by streak artifact from adjacent aneurysm clips.
Surrounding right frontal lobe parenchymal hypoattenuation is at
most minimally more prominent than on the prior study and likely
largely reflects chronic gliosis although there may be a small
amount of superimposed acute vasogenic edema related to the
hemorrhage.

There is no evidence of acute intracranial hemorrhage elsewhere. A
chronic right basal ganglia infarct is again seen, and there is ex
vacuo dilatation of the right frontal horn. The there is also a
tiny, chronic left cerebellar infarct. There is no midline shift or
extra-axial fluid collection. No definite abnormal enhancement is
identified.

Orbits are unremarkable. Mild bilateral ethmoid air cell mucosal
thickening is noted. The mastoid air cells are clear.
IMPRESSION: 1. Small acute hemorrhage in an area of gliotic medial right frontal
lobe. No significant mass effect or midline shift.
2. Chronic postoperative and ischemic changes as above.
Critical Value/emergent results were called by telephone at the time
of interpretation on 12/27/2014 at [DATE] to Dr. Asha, who
verbally acknowledged these results.

## 2016-06-06 NOTE — Progress Notes (Signed)
Patient ID: Peter Barnett, male   DOB: 29-Jul-1960, 56 y.o.   MRN: [id number]     Cardiology Office Note   Date:  06/07/2016   ID:  Peter Barnett, DOB 03-Feb-1961, MRN [id number]  PCP:  Tammi Sou, MD    Chief Complaint  Patient presents with  . Follow-up    6 month  CAD   Wt Readings from Last 3 Encounters:  06/07/16 249 lb 2 oz (113 kg)  03/10/16 255 lb (115.7 kg)  01/08/16 252 lb (114.3 kg)       History of Present Illness: Peter Barnett is a 56 y.o. male  with premature atherosclerosis. He has had several DES.  Chest Pain:  Walking Limited by back pain. He is more sedentary in the winter.  He had an ICH in 11/16.  THis led to a large seizure.  He had back surgery in 11/17.  He had a NSTEMI after the surgery and had a diagonal stent placed, 2.25 DES, in a diagonal.  He was on Brilinta briefly. Due to his history of intracranial hemorrhage, this was changed to clopidogrel.  We spoke about why he did not have a stress test prior to that surgery.  He was stable from a sx standpoint and had a known CTO of the distal RCA- which would have afected stress results.  He had a cath a few years ago showing this.  THey felt that he was kept in the hospital from Friday to Monday on IV NTG before getting cathed.  Unsure why that was, but may have been related to the recent back surgery.  Cardiac rehabilitation was too expensive. He has fatigue. His spinal stenosis has not improved after surgery. He still has back pain.  His wife had a valve replacement and thoracic aneurysm repair in 2426 complicated by atrial fibrillation.  She has been doing well.   He has Been trying to healthy. He has lost 6 pounds since last visit. He will be more active as the weather gets better. He does yard work.      Past Medical History:  Diagnosis Date  . CAD (coronary artery disease)    a. MI/DES to Cx 2009 with staged PCI of LAD with DES and distal RCA with BMS. b. NSTEMI 12/2015 s/p DES to  D1, known occlusion of dRCA tx medically.  . Chronic lower back pain    Still with recurrent left sided LBP with paresthesias down left leg --primarily with activities: pain pill use is avg <90 tabs per mo  . COPD (chronic obstructive pulmonary disease) ([place])    NOTED on CT chest 11/2013 done for lung ca screening.  Hosp for acute exac 11/2014.  Spirometry not supportive of this dx as of pulm eval 2017, though.  Spireva trial by Dr. Elsworth Soho 02/2015.  Marland Kitchen GERD (gastroesophageal reflux disease)   . Heart attack ([place]) 12/12/2015  . History of adenomatous polyp of colon 02/2009;02/2014   Recall 5 yrs ([facility])  . History of kidney stones   . HTN (hypertension)   . Hyperlipidemia   . Intracranial hemorrhage ([place]) 12/27/14   small acute hemorrhage in gliotic medial R frontal lobe  . Memory loss   . MI (myocardial infarction) ([place]) 2009   "just had arm pain; never any chest pain"  . Migraine syndrome 10/07/2011    a couple per month usually, but worsening 2017 so Dr. Krista Blue adjusted med  . 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 Seizures ([place])  last one was fall of 2016.  Complex partial sz d/o.  Marland Kitchen Short-term memory loss 10/07/2011   "post brain OR & from his seizures"--worsening 2017, Dr. Krista Blue in neurology doing w/u.  Marland Kitchen Stroke ([place]) 12/2014  . Tobacco abuse   . Urethral stricture since 1981   Recurrent dilation has been required [facility] urology)    Past Surgical History:  Procedure Laterality Date  . BACK SURGERY  11/25/2015  . CARDIAC CATHETERIZATION  2011   [facility], records unavailable: pt reports "normal".  . CARDIAC CATHETERIZATION N/A 12/15/2015   Procedure: Left Heart Cath and Coronary Angiography;  Surgeon: Peter M Martinique, MD;  Location: [facility];  Service: Cardiovascular;  Laterality: N/A;  . CARDIAC CATHETERIZATION N/A 12/15/2015   Procedure: Coronary Stent Intervention;  Surgeon: Peter M Martinique, MD;  Location: [place] [facility];  Service: Cardiovascular;   Laterality: N/A;  . COLONOSCOPY W/ POLYPECTOMY  02/2009;02/2014   Recall 5 yr (aden poly, hyperplast polyp, int and ext hemorr).  Next recall is 02/2019.  Marland Kitchen CORONARY ANGIOPLASTY WITH STENT PLACEMENT  2009   3 DES to LCx; still with known distal RCA/PDA occlusion.  EF 60% by LV-gram.  . CRANIOTOMY FOR AVM  1981   Dx'd after pt began having seizures.  . CT ANGIOGRAM (CEREBRAL)  12/31/14   NORMAL--plan per neuro is to repeat this in 3 mo.  Pt states it was repeated Spring 2017 and was "fine"  . [place] SURGERY  2008; 2009   Dr. Joya Salm  . LUMBAR LAMINECTOMY/DECOMPRESSION MICRODISCECTOMY N/A 11/25/2015   Procedure: Lumbar three- four Lumbar four- five Laminectomy/Foraminotomy;  Surgeon: Leeroy Cha, MD;  Location: [facility];  Service: Neurosurgery;  Laterality: N/A;  L3-4 L4-5 Laminectomy/Foraminotomy/possible Lumbar Drain  . TONSILLECTOMY     "I was little"  . TRANSTHORACIC ECHOCARDIOGRAM  12/07/14   Normal LV size/fxn, EF 60%, normal valves.     Current Outpatient Prescriptions  Medication Sig Dispense Refill  . acetaminophen (TYLENOL) 500 MG tablet Take 1 tablet (500 mg total) by mouth every 6 (six) hours as needed for headache. 30 tablet 0  . albuterol (PROVENTIL HFA;VENTOLIN HFA) 108 (90 BASE) MCG/ACT inhaler Inhale 2 puffs into the lungs every 6 (six) hours as needed for wheezing or shortness of breath. 1 Inhaler 2  . aspirin EC 81 MG tablet Take 81 mg by mouth daily.    . butalbital-acetaminophen-caffeine (FIORICET, ESGIC) 50-325-40 MG tablet Take 1 tablet by mouth every 6 (six) hours as needed for headache. 12 tablet 5  . Cholecalciferol 1000 units tablet Take 1,000 Units by mouth daily.    . clopidogrel (PLAVIX) 75 MG tablet Take 4 tablets by mouth daily on day 1 then take 1 tablet by mouth daily thereafter 94 tablet 3  . docusate sodium (COLACE) 100 MG capsule Take 200 mg by mouth daily.     . famotidine (PEPCID) 20 MG tablet Take 20 mg by mouth daily.    Marland Kitchen  HYDROcodone-acetaminophen (NORCO) 10-325 MG per tablet Take 1 tablet by mouth every 6 (six) hours as needed (pain).     . isosorbide mononitrate (IMDUR) 30 MG 24 hr tablet Take 1 tablet (30 mg total) by mouth daily. 30 tablet 6  . metoprolol tartrate (LOPRESSOR) 25 MG tablet TAKE 1/2 TABLET BY MOUTH TWICE DAILY 30 tablet 2  . nitroGLYCERIN (NITROSTAT) 0.4 MG SL tablet Place 1 tablet (0.4 mg total) under the tongue every 5 (five) minutes as needed for chest pain (CP or SOB). 25 tablet  3  . oxcarbazepine (TRILEPTAL) 600 MG tablet Take 1 tablet (600 mg total) by mouth 2 (two) times daily. 180 tablet 4  . rosuvastatin (CRESTOR) 40 MG tablet Take 1 tablet (40 mg total) by mouth daily at 6 PM. 30 tablet 6   No current facility-administered medications for this visit.     Allergies:   Tramadol and Atorvastatin    Social History:  The patient  reports that he quit smoking about 15 years ago. His smoking use included Cigarettes. He has a 81.00 pack-year smoking history. He has never used smokeless tobacco. He reports that he drinks about 4.2 oz of alcohol per week . He reports that he does not use drugs.   Family History:  The patient's family history includes Asthma in his father; Cancer in his father; Heart attack in his paternal uncle; Hyperlipidemia in his mother; Hypertension in his father.    ROS:  Please see the history of present illness.   Otherwise, review of systems are positive for back pain.   All other systems are reviewed and negative.    PHYSICAL EXAM: VS:  BP 114/76   Pulse 61   Wt 249 lb 2 oz (113 kg)   BMI 34.75 kg/m  , BMI Body mass index is 34.75 kg/m. GEN: Well nourished, well developed, in no acute distress  HEENT: normal  Neck: no JVD, carotid bruits, or masses Cardiac: RRR; no murmurs, rubs, or gallops,no edema  Respiratory:  clear to auscultation bilaterally, normal work of breathing GI: soft, nontender, nondistended, + BS MS: no deformity or atrophy  Skin: warm  and dry, no rash Neuro:  Strength and sensation are intact Psych: euthymic mood, full affect   EKG:   The ekg ordered 2/16 demonstrates no ischemic changes    Recent Labs: 06/17/2015: TSH 1.730 12/16/2015: Hemoglobin 13.6; Platelets 188 02/06/2016: ALT 32; BUN 12; Creat 0.90; Potassium 4.2; Sodium 137   Lipid Panel    Component Value Date/Time   CHOL 149 02/06/2016 0749   TRIG 99 02/06/2016 0749   HDL 43 02/06/2016 0749   CHOLHDL 3.5 02/06/2016 0749   VLDL 20 02/06/2016 0749   LDLCALC 86 02/06/2016 0749     Other studies Reviewed: Additional studies/ records that were reviewed today with results demonstrating: Cath report from 2017 reviewed .  2017 lipids reviewed.   ASSESSMENT AND PLAN:  1. CAD/Old MI: Continue aggressive secondary prevention.  Now with stents in the LAD, diagonal and circumflex. RCA stent which was in a small vessel was occluded with collaterals. LV function was normal. Okay to continue aspirin 81 mg daily.  No angina at this time on current medical therapy. Continue isosorbide. In November 2018, would plan on stopping aspirin. 2. Obesity: We spoke about weight loss and healthy eating. He tries to minimize fried foods. Only rarely does he splurge. Hopefully, increased exercise will also help him lose weight. 3. Hyperlipidemia: Was controlled in 12/17. Will recheck in 11/18.    Current medicines are reviewed at length with the patient today.  The patient concerns regarding his medicines were addressed.  The following changes have been made:    Labs/ tests ordered today include:   No orders of the defined types were placed in this encounter.   Recommend 150 minutes/week of aerobic exercise Low fat, low carb, high fiber diet recommended  Disposition:   FU in  1 year   Signed, Larae Grooms, MD  06/07/2016 8:40 AM    [facility]  [address] [place], [place]  [address] Phone: [phone]; Fax: [phone]

## 2016-06-07 ENCOUNTER — Encounter: Payer: Self-pay | Admitting: Interventional Cardiology

## 2016-06-07 ENCOUNTER — Other Ambulatory Visit: Payer: Self-pay | Admitting: Cardiology

## 2016-06-07 ENCOUNTER — Ambulatory Visit (INDEPENDENT_AMBULATORY_CARE_PROVIDER_SITE_OTHER): Payer: Medicare HMO | Admitting: Interventional Cardiology

## 2016-06-07 VITALS — BP 114/76 | HR 61 | Wt 249.1 lb

## 2016-06-07 DIAGNOSIS — E782 Mixed hyperlipidemia: Secondary | ICD-10-CM

## 2016-06-07 DIAGNOSIS — E669 Obesity, unspecified: Secondary | ICD-10-CM | POA: Diagnosis not present

## 2016-06-07 DIAGNOSIS — I25119 Atherosclerotic heart disease of native coronary artery with unspecified angina pectoris: Secondary | ICD-10-CM

## 2016-06-07 DIAGNOSIS — I252 Old myocardial infarction: Secondary | ICD-10-CM

## 2016-06-07 NOTE — Patient Instructions (Signed)
Medication Instructions:  Your physician recommends that you continue on your current medications as directed. Please refer to the Current Medication list given to you today.   Labwork: 12/2016 FASTING LIPID AND CMET ; THIS CAN BE DONE THE MORNING YOU SEE DR. VARANASI IN 12/2016 OR YOU CAN COME IN 1-2 DAYS BEFORE YOUR APPT WITH DR. VARANASI  Testing/Procedures: NONE ORDERED  Follow-Up: DR. VARANASI IN 12/2016  Any Other Special Instructions Will Be Listed Below (If Applicable).     If you need a refill on your cardiac medications before your next appointment, please call your pharmacy.

## 2016-06-09 DIAGNOSIS — Z1159 Encounter for screening for other viral diseases: Secondary | ICD-10-CM | POA: Diagnosis not present

## 2016-06-09 DIAGNOSIS — G8929 Other chronic pain: Secondary | ICD-10-CM | POA: Diagnosis not present

## 2016-06-09 DIAGNOSIS — M5442 Lumbago with sciatica, left side: Principal | ICD-10-CM

## 2016-06-09 DIAGNOSIS — G40909 Epilepsy, unspecified, not intractable, without status epilepticus: Secondary | ICD-10-CM | POA: Diagnosis not present

## 2016-06-09 DIAGNOSIS — Z0000 Encounter for general adult medical examination without abnormal findings: Secondary | ICD-10-CM | POA: Diagnosis not present

## 2016-06-09 DIAGNOSIS — Q282 Arteriovenous malformation of cerebral vessels: Secondary | ICD-10-CM | POA: Diagnosis not present

## 2016-06-09 DIAGNOSIS — I2510 Atherosclerotic heart disease of native coronary artery without angina pectoris: Secondary | ICD-10-CM | POA: Diagnosis not present

## 2016-06-09 DIAGNOSIS — K635 Polyp of colon: Secondary | ICD-10-CM | POA: Diagnosis not present

## 2016-06-19 ENCOUNTER — Other Ambulatory Visit: Payer: Self-pay | Admitting: Cardiology

## 2016-06-21 ENCOUNTER — Other Ambulatory Visit: Payer: Self-pay | Admitting: Neurology

## 2016-07-12 DIAGNOSIS — I10 Essential (primary) hypertension: Secondary | ICD-10-CM | POA: Diagnosis not present

## 2016-07-12 DIAGNOSIS — Z8774 Personal history of (corrected) congenital malformations of heart and circulatory system: Secondary | ICD-10-CM | POA: Diagnosis not present

## 2016-07-12 DIAGNOSIS — I2510 Atherosclerotic heart disease of native coronary artery without angina pectoris: Secondary | ICD-10-CM | POA: Diagnosis not present

## 2016-07-12 DIAGNOSIS — G40909 Epilepsy, unspecified, not intractable, without status epilepticus: Secondary | ICD-10-CM | POA: Diagnosis not present

## 2016-07-14 ENCOUNTER — Ambulatory Visit: Payer: Medicare HMO | Admitting: Neurology

## 2016-07-15 ENCOUNTER — Other Ambulatory Visit: Payer: Self-pay | Admitting: Cardiology

## 2016-07-15 NOTE — Telephone Encounter (Signed)
Medication Detail    Disp Refills Start End   isosorbide mononitrate (IMDUR) 30 MG 24 hr tablet 90 tablet 3 06/21/2016    Sig: TAKE 1 TABLET BY MOUTH EVERY DAY   Notes to Pharmacy: [facility] 90 DAY   E-Prescribing Status: Receipt confirmed by pharmacy (06/21/2016 12:33 PM EDT)   Pharmacy   [id number] - [place], [place] - [address]

## 2016-08-26 DIAGNOSIS — I2510 Atherosclerotic heart disease of native coronary artery without angina pectoris: Secondary | ICD-10-CM | POA: Diagnosis not present

## 2016-08-26 DIAGNOSIS — M79602 Pain in left arm: Secondary | ICD-10-CM | POA: Diagnosis not present

## 2016-08-26 DIAGNOSIS — I10 Essential (primary) hypertension: Secondary | ICD-10-CM | POA: Diagnosis not present

## 2016-09-17 DIAGNOSIS — Z8673 Personal history of transient ischemic attack (TIA), and cerebral infarction without residual deficits: Secondary | ICD-10-CM | POA: Diagnosis not present

## 2016-09-17 DIAGNOSIS — Z888 Allergy status to other drugs, medicaments and biological substances status: Secondary | ICD-10-CM | POA: Diagnosis not present

## 2016-09-17 DIAGNOSIS — Z885 Allergy status to narcotic agent status: Secondary | ICD-10-CM | POA: Diagnosis not present

## 2016-09-17 DIAGNOSIS — Z87891 Personal history of nicotine dependence: Secondary | ICD-10-CM | POA: Diagnosis not present

## 2016-09-17 DIAGNOSIS — I2510 Atherosclerotic heart disease of native coronary artery without angina pectoris: Secondary | ICD-10-CM | POA: Diagnosis not present

## 2016-09-17 DIAGNOSIS — R600 Localized edema: Secondary | ICD-10-CM | POA: Diagnosis not present

## 2016-09-17 DIAGNOSIS — I252 Old myocardial infarction: Secondary | ICD-10-CM | POA: Diagnosis not present

## 2016-09-17 DIAGNOSIS — R569 Unspecified convulsions: Secondary | ICD-10-CM | POA: Diagnosis not present

## 2016-09-17 DIAGNOSIS — M7989 Other specified soft tissue disorders: Secondary | ICD-10-CM | POA: Diagnosis not present

## 2016-09-17 DIAGNOSIS — I10 Essential (primary) hypertension: Secondary | ICD-10-CM | POA: Diagnosis not present

## 2016-09-17 DIAGNOSIS — Z7901 Long term (current) use of anticoagulants: Secondary | ICD-10-CM | POA: Diagnosis not present

## 2016-09-17 DIAGNOSIS — J449 Chronic obstructive pulmonary disease, unspecified: Secondary | ICD-10-CM | POA: Diagnosis not present

## 2016-09-27 DIAGNOSIS — I10 Essential (primary) hypertension: Secondary | ICD-10-CM | POA: Diagnosis not present

## 2016-09-27 DIAGNOSIS — R609 Edema, unspecified: Secondary | ICD-10-CM | POA: Diagnosis not present

## 2016-09-27 DIAGNOSIS — I2510 Atherosclerotic heart disease of native coronary artery without angina pectoris: Secondary | ICD-10-CM | POA: Diagnosis not present

## 2016-10-15 DIAGNOSIS — R69 Illness, unspecified: Secondary | ICD-10-CM | POA: Diagnosis not present

## 2016-11-05 ENCOUNTER — Other Ambulatory Visit: Payer: Self-pay | Admitting: Interventional Cardiology

## 2016-11-22 DIAGNOSIS — E782 Mixed hyperlipidemia: Secondary | ICD-10-CM | POA: Diagnosis not present

## 2016-11-22 DIAGNOSIS — I2510 Atherosclerotic heart disease of native coronary artery without angina pectoris: Secondary | ICD-10-CM | POA: Diagnosis not present

## 2016-12-03 ENCOUNTER — Other Ambulatory Visit: Payer: Self-pay

## 2016-12-03 MED ORDER — ROSUVASTATIN CALCIUM 40 MG PO TABS: ORAL_TABLET | ORAL | 0 refills | Status: DC

## 2016-12-07 ENCOUNTER — Other Ambulatory Visit: Payer: Self-pay | Admitting: Physician Assistant

## 2016-12-07 MED ORDER — ROSUVASTATIN CALCIUM 40 MG PO TABS: ORAL_TABLET | ORAL | 1 refills | Status: DC

## 2016-12-07 NOTE — Addendum Note (Signed)
Addended by: Derl Barrow on: 12/07/2016 01:46 PM   Modules accepted: Orders

## 2017-01-07 DIAGNOSIS — Z122 Encounter for screening for malignant neoplasm of respiratory organs: Secondary | ICD-10-CM | POA: Diagnosis not present

## 2017-01-07 DIAGNOSIS — I10 Essential (primary) hypertension: Secondary | ICD-10-CM | POA: Diagnosis not present

## 2017-01-07 DIAGNOSIS — M25551 Pain in right hip: Secondary | ICD-10-CM | POA: Diagnosis not present

## 2017-01-07 DIAGNOSIS — I2510 Atherosclerotic heart disease of native coronary artery without angina pectoris: Secondary | ICD-10-CM | POA: Diagnosis not present

## 2017-01-07 DIAGNOSIS — M5441 Lumbago with sciatica, right side: Secondary | ICD-10-CM | POA: Diagnosis not present

## 2017-01-07 DIAGNOSIS — G8929 Other chronic pain: Secondary | ICD-10-CM | POA: Diagnosis not present

## 2017-01-12 DIAGNOSIS — M5416 Radiculopathy, lumbar region: Secondary | ICD-10-CM | POA: Diagnosis not present

## 2017-01-12 DIAGNOSIS — Z6834 Body mass index (BMI) 34.0-34.9, adult: Secondary | ICD-10-CM | POA: Diagnosis not present

## 2017-01-12 DIAGNOSIS — I10 Essential (primary) hypertension: Secondary | ICD-10-CM | POA: Diagnosis not present

## 2017-01-19 ENCOUNTER — Other Ambulatory Visit: Payer: Self-pay | Admitting: Physician Assistant

## 2017-01-19 DIAGNOSIS — M5416 Radiculopathy, lumbar region: Secondary | ICD-10-CM

## 2017-01-20 ENCOUNTER — Telehealth: Payer: Self-pay | Admitting: Interventional Cardiology

## 2017-01-20 NOTE — Telephone Encounter (Signed)
Spoke with patient who states that he is suppose to have a myelogram done and will need clearance to come off of his plavix. Made patient aware that we have not received a clearance request form yet. Made patient aware that we will need to receive a fax requesting clearance from the doctor requesting clearance. Patient verbalized understanding and states that he will have them fax over the information.

## 2017-01-20 NOTE — Telephone Encounter (Signed)
[name] ( Wife) is calling to check on a clearance for Peter Barnett to come off of his Plavix . Please call

## 2017-01-21 ENCOUNTER — Telehealth: Payer: Self-pay | Admitting: *Deleted

## 2017-01-21 NOTE — Telephone Encounter (Signed)
   [facility] Pre-operative Risk Assessment    Request for surgical clearance:  1. What type of surgery is being performed? LUMBAR MYELOGRAM    2. When is this surgery scheduled? NO DATE YET   3. Are there any medications that need to be held prior to surgery and how long? PLAVIX  5 DAYS PRIOR   4. Practice name and name of physician performing surgery? [facility];   5. What is your office phone and fax number? PH# [phone]; FAX # [phone]   1. Anesthesia type (None, local, MAC, general) ?    Julaine Hua 01/21/2017, 4:42 PM  _________________________________________________________________   (provider comments below)

## 2017-01-24 NOTE — Telephone Encounter (Signed)
Called [facility], re:pts' myleogram. Left her a detailed message for her to call the preop call back pool to clarify that the pt will not be receiving any anesthesia with this test.

## 2017-01-24 NOTE — Telephone Encounter (Signed)
Callback staff left message for spine center to clarify further details of procedure. As of right now, patient does not think anesthesia involved. Will route to Dr. Irish Lack to find out if OK to hold Plavix 5 days prior to procedure - significant hx of several DES and known CTO of distal RCA.  Per last plan, pt was due to f/u 12/2016 to discuss dc of aspirin but did not schedule. He has rescheduled for January.   Dr. Irish Lack, please route input to [facility] PREOP. Let us know if you feel patient needs OV prior to stopping Plavix since he missed appt 12/2016.   Cassadie Pankonin PA-C

## 2017-01-24 NOTE — Telephone Encounter (Signed)
Pt is having this myelogram because he can't have a ct because has staples in his head.  This is no treatment, no anesthesia, per pt.

## 2017-01-24 NOTE — Telephone Encounter (Signed)
   Chart reviewed as part of pre-operative protocol coverage.   Patient last seen 05/2016 with recommendation to f/u 12/2016 which does not appear to have occurred. Callback team, can you please clarify if patient will be undergoing any kind of anesthesia for procedure, or further details on what this entails? If just question regarding Plavix, will be able to discuss with Dr. Irish Lack without having to schedule urgent appt. If there is anesthesia involved, patient will need OV prior to clearance.  Charlie Pitter, PA-C 01/24/2017, 3:09 PM

## 2017-01-24 NOTE — Telephone Encounter (Signed)
OK to hold Plavix 5 days prior to procedure. OK to continue aspirin and Plavix for now.

## 2017-01-25 ENCOUNTER — Telehealth: Payer: Self-pay | Admitting: *Deleted

## 2017-01-25 NOTE — Telephone Encounter (Signed)
Follow Up:     Angelita Ingles from [facility] called and wanted you to know pt will not be put to sleep. He is having a Myelogram of the Lumbar. Please fax clearance to [phone] [id number]

## 2017-01-25 NOTE — Telephone Encounter (Signed)
   Primary Cardiologist: No primary care provider on file.  Chart reviewed as part of pre-operative protocol coverage. Given past medical history and time since last visit, based on ACC/AHA guidelines, Peter Barnett would be at acceptable risk for the planned procedure without further cardiovascular testing. If necessary, he may hold plavix beginning 5 days prior to the procedure, with a plan to resume it post-procedure.  Ideally, he would remain on ASA,  blocker, and statin therapy throughout the peri-procedural timeframe.     Please call with questions.  Murray Hodgkins, NP 01/25/2017, 3:57 PM

## 2017-01-28 DIAGNOSIS — Z955 Presence of coronary angioplasty implant and graft: Secondary | ICD-10-CM | POA: Insufficient documentation

## 2017-01-28 DIAGNOSIS — E7800 Pure hypercholesterolemia, unspecified: Secondary | ICD-10-CM | POA: Diagnosis not present

## 2017-01-28 DIAGNOSIS — I444 Left anterior fascicular block: Secondary | ICD-10-CM | POA: Diagnosis not present

## 2017-01-28 DIAGNOSIS — I2510 Atherosclerotic heart disease of native coronary artery without angina pectoris: Secondary | ICD-10-CM | POA: Diagnosis not present

## 2017-01-28 DIAGNOSIS — I252 Old myocardial infarction: Secondary | ICD-10-CM | POA: Diagnosis not present

## 2017-01-28 DIAGNOSIS — Z01810 Encounter for preprocedural cardiovascular examination: Secondary | ICD-10-CM | POA: Diagnosis not present

## 2017-01-28 DIAGNOSIS — I10 Essential (primary) hypertension: Secondary | ICD-10-CM | POA: Diagnosis not present

## 2017-02-09 ENCOUNTER — Ambulatory Visit: Payer: Medicare HMO | Admitting: Cardiology

## 2017-02-15 ENCOUNTER — Ambulatory Visit
Admission: RE | Admit: 2017-02-15 | Discharge: 2017-02-15 | Disposition: A | Discharge status: Home / Self Care | Payer: Medicare HMO | Source: Ambulatory Visit | Attending: Physician Assistant | Admitting: Physician Assistant

## 2017-02-15 VITALS — BP 117/64 | HR 55

## 2017-02-15 DIAGNOSIS — G8929 Other chronic pain: Secondary | ICD-10-CM

## 2017-02-15 DIAGNOSIS — Z87898 Personal history of other specified conditions: Secondary | ICD-10-CM | POA: Insufficient documentation

## 2017-02-15 DIAGNOSIS — M5416 Radiculopathy, lumbar region: Secondary | ICD-10-CM

## 2017-02-15 DIAGNOSIS — M5442 Lumbago with sciatica, left side: Principal | ICD-10-CM

## 2017-02-15 DIAGNOSIS — M48061 Spinal stenosis, lumbar region without neurogenic claudication: Secondary | ICD-10-CM | POA: Diagnosis not present

## 2017-02-15 MED ORDER — IOPAMIDOL (ISOVUE-M 200) INJECTION 41%
15.0000 mL | Freq: Once | INTRAMUSCULAR | Status: AC
  Administered 2017-02-15: 15 mL via INTRATHECAL

## 2017-02-15 MED ORDER — DIAZEPAM 5 MG PO TABS
10.0000 mg | ORAL_TABLET | Freq: Once | ORAL | Status: AC
  Administered 2017-02-15: 10 mg via ORAL

## 2017-02-15 NOTE — Progress Notes (Signed)
Patient states he has been off Plavix for at least the past five days.  Gypsy Lore, RN

## 2017-02-15 NOTE — Discharge Instructions (Signed)
Myelogram Discharge Instructions ° °1. Go home and rest quietly for the next 24 hours.  It is important to lie flat for the next 24 hours.  Get up only to go to the restroom.  You may lie in the bed or on a couch on your back, your stomach, your left side or your right side.  You may have one pillow under your head.  You may have pillows between your knees while you are on your side or under your knees while you are on your back. ° °2. DO NOT drive today.  Recline the seat as far back as it will go, while still wearing your seat belt, on the way home. ° °3. You may get up to go to the bathroom as needed.  You may sit up for 10 minutes to eat.  You may resume your normal diet and medications unless otherwise indicated.  Drink plenty of extra fluids today and tomorrow. ° °4. The incidence of a spinal headache with nausea and/or vomiting is about 5% (one in 20 patients).  If you develop a headache, lie flat and drink plenty of fluids until the headache goes away.  Caffeinated beverages may be helpful.  If you develop severe nausea and vomiting or a headache that does not go away with flat bed rest, call [phone]. ° °5. You may resume normal activities after your 24 hours of bed rest is over; however, do not exert yourself strongly or do any heavy lifting tomorrow. ° °6. Call your physician for a follow-up appointment.  ° °You may resume Plavix today. °

## 2017-02-21 DIAGNOSIS — M2578 Osteophyte, vertebrae: Secondary | ICD-10-CM | POA: Diagnosis not present

## 2017-02-21 DIAGNOSIS — Z6834 Body mass index (BMI) 34.0-34.9, adult: Secondary | ICD-10-CM | POA: Diagnosis not present

## 2017-02-21 DIAGNOSIS — M48061 Spinal stenosis, lumbar region without neurogenic claudication: Secondary | ICD-10-CM | POA: Diagnosis not present

## 2017-02-21 DIAGNOSIS — I10 Essential (primary) hypertension: Secondary | ICD-10-CM | POA: Diagnosis not present

## 2017-02-24 DIAGNOSIS — Z87891 Personal history of nicotine dependence: Secondary | ICD-10-CM | POA: Diagnosis not present

## 2017-03-23 DIAGNOSIS — L570 Actinic keratosis: Secondary | ICD-10-CM | POA: Diagnosis not present

## 2017-03-23 DIAGNOSIS — L304 Erythema intertrigo: Secondary | ICD-10-CM | POA: Diagnosis not present

## 2017-03-23 DIAGNOSIS — D485 Neoplasm of uncertain behavior of skin: Secondary | ICD-10-CM | POA: Diagnosis not present

## 2017-03-23 DIAGNOSIS — D229 Melanocytic nevi, unspecified: Secondary | ICD-10-CM | POA: Diagnosis not present

## 2017-03-23 DIAGNOSIS — D224 Melanocytic nevi of scalp and neck: Secondary | ICD-10-CM | POA: Diagnosis not present

## 2017-04-19 IMAGING — CR DG CHEST 2V
2 series · 2 of 2 positions shown · non-contrast
Comparison: 12/07/2014

CLINICAL DATA: Chest pain

EXAM:
CHEST  2 VIEW

[chest pa]
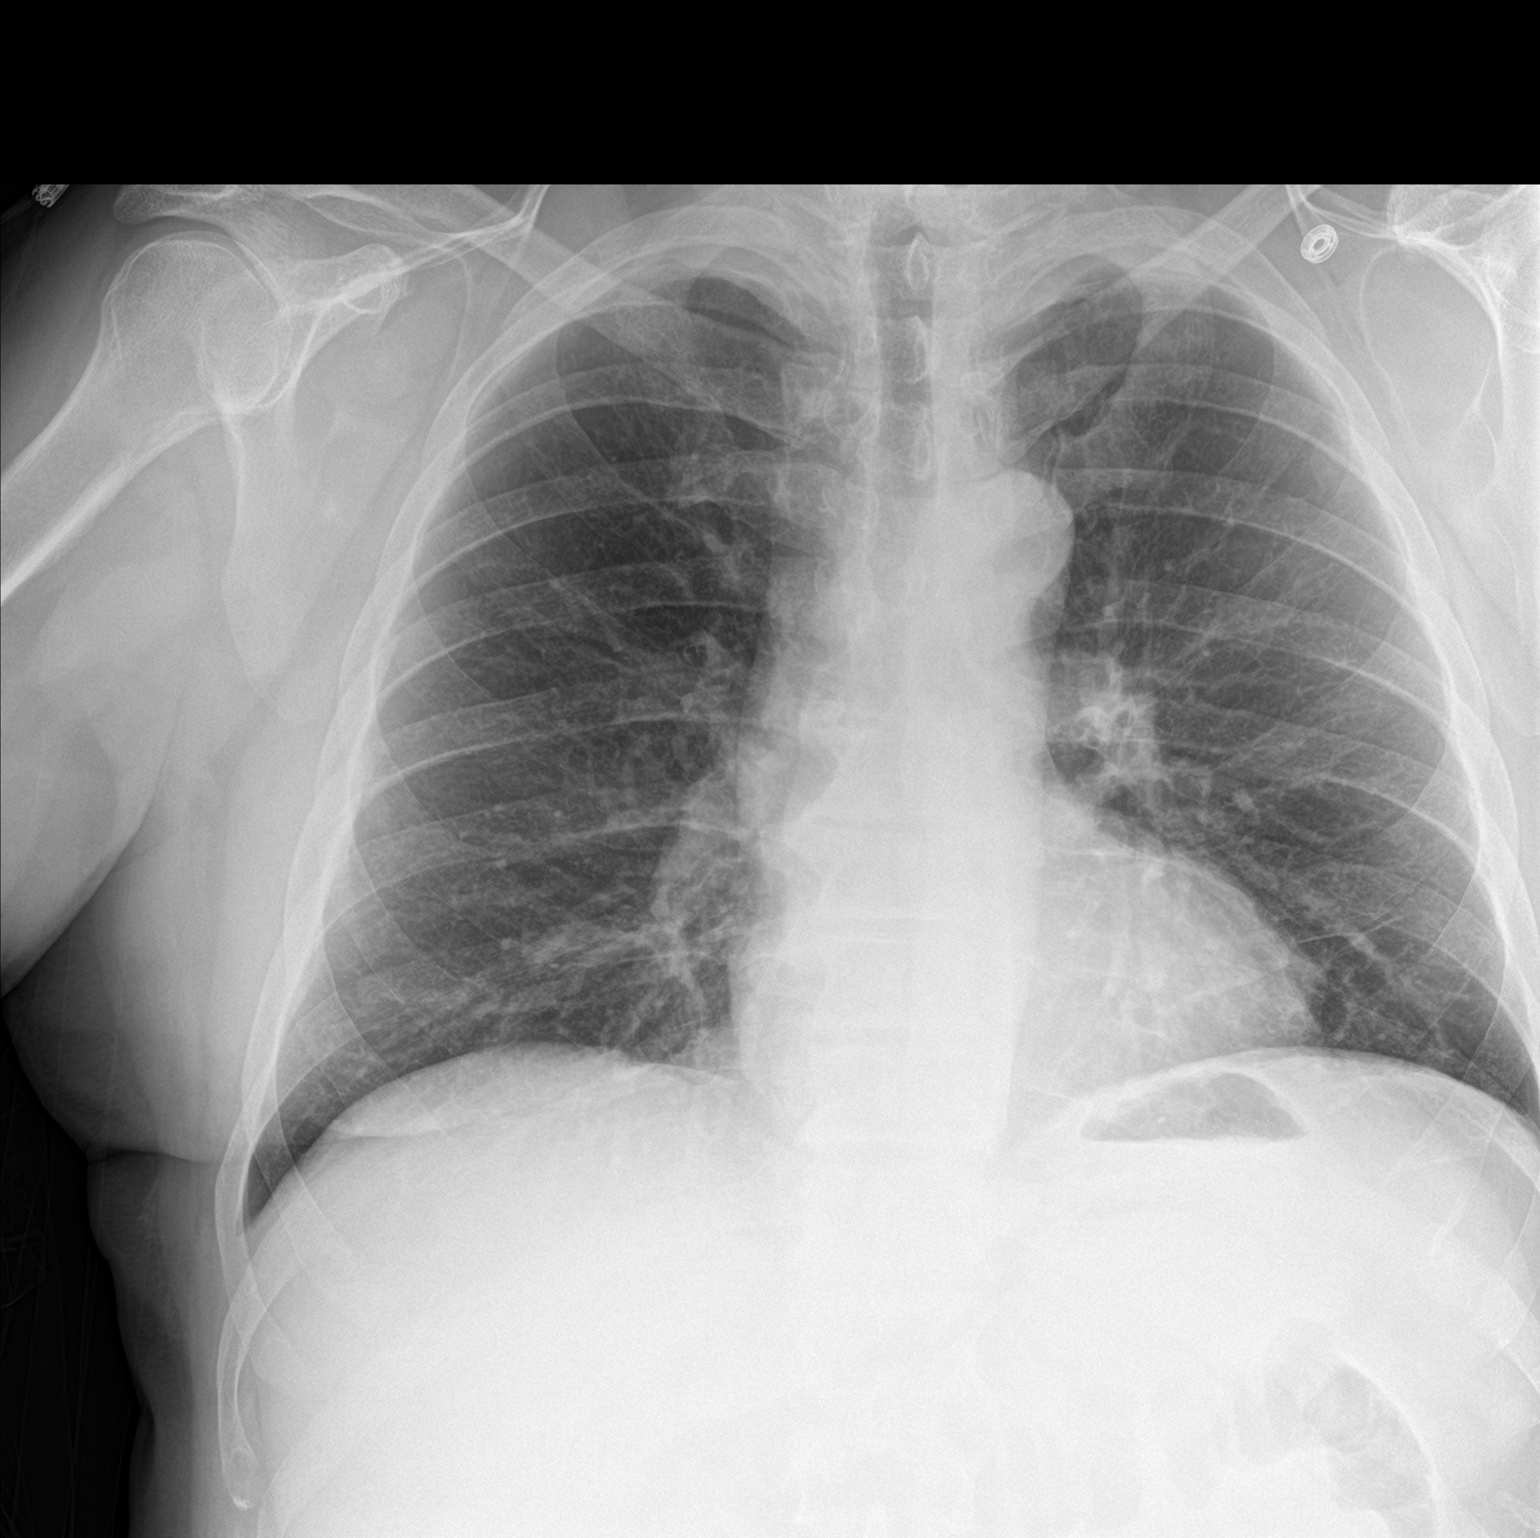

[chest lat]
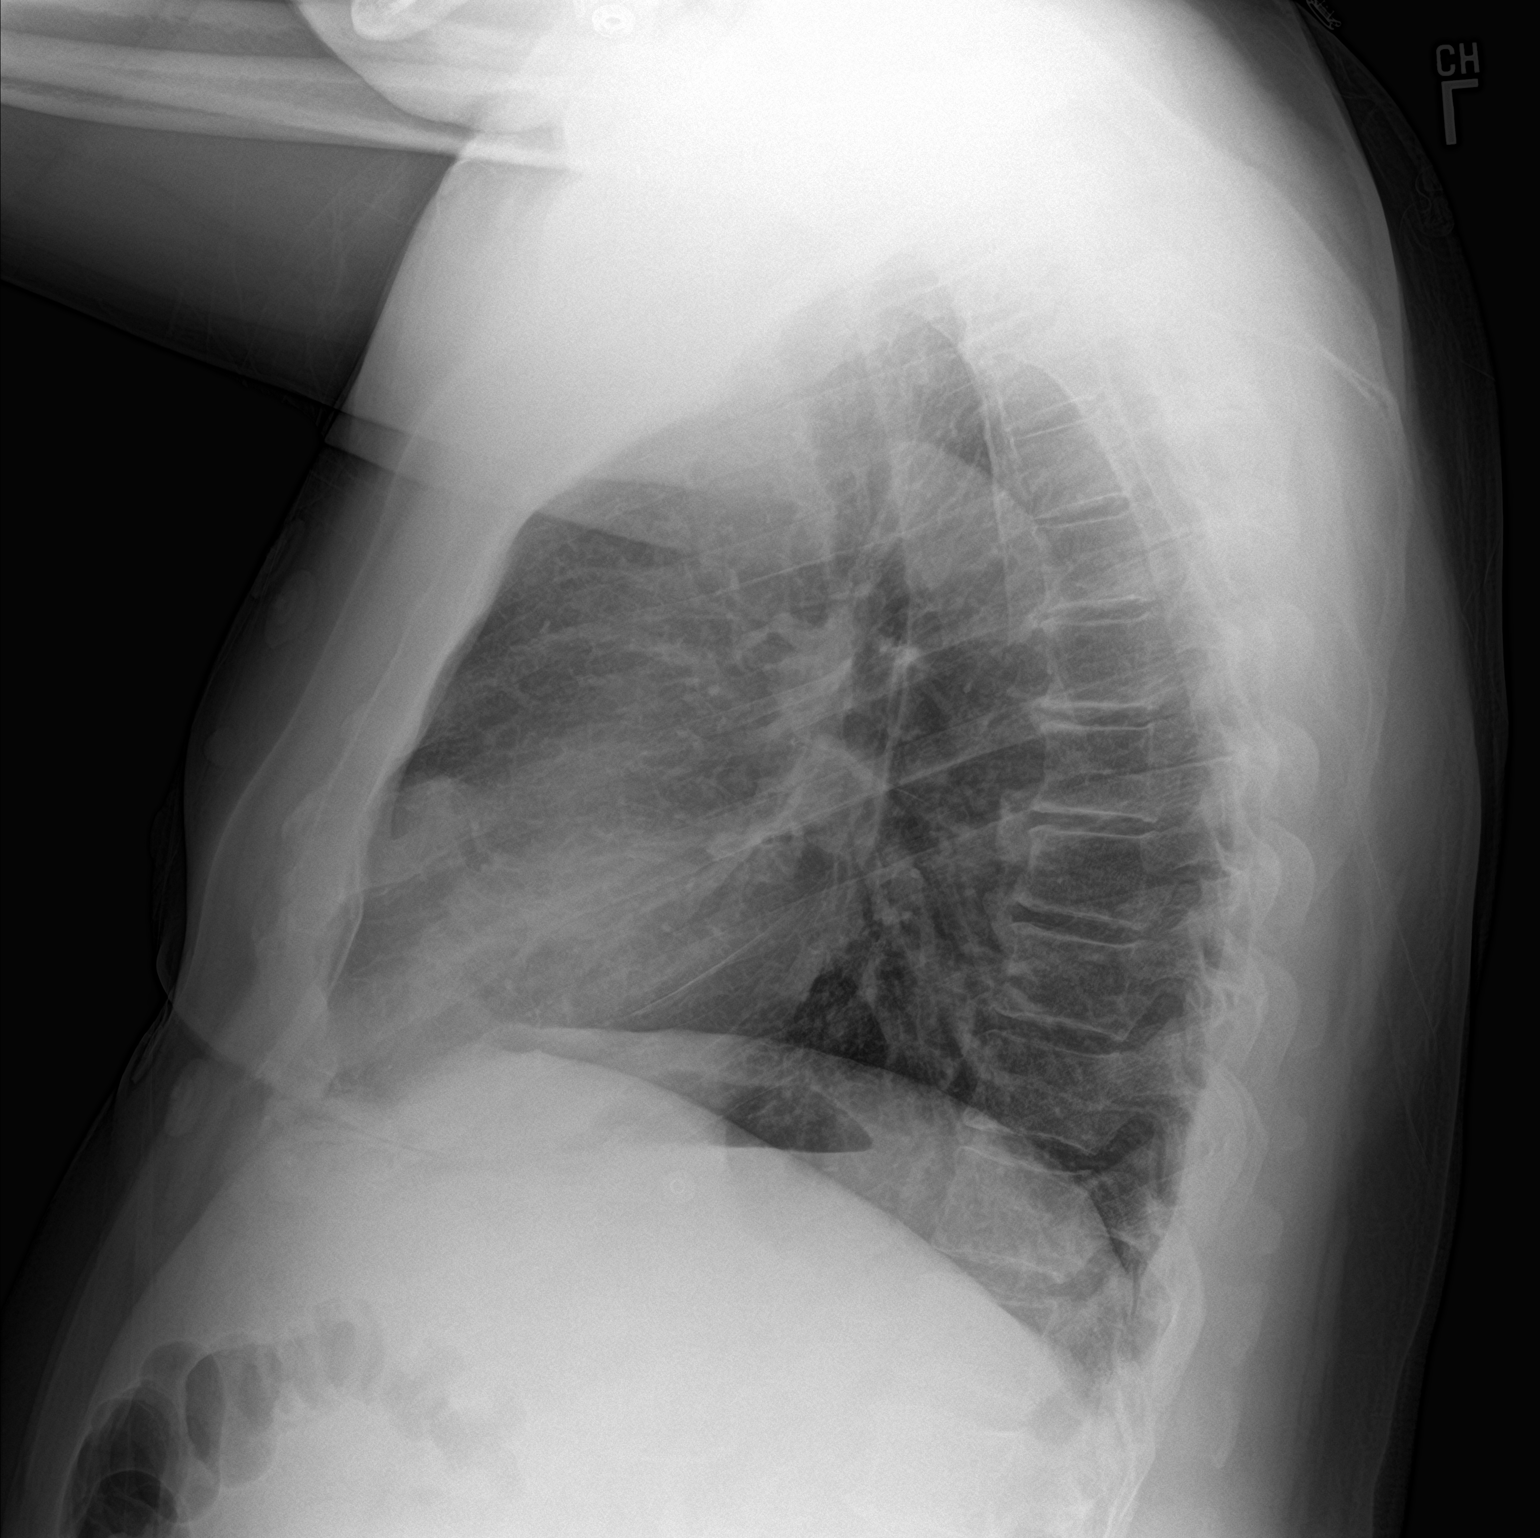

[2 of 2 positions shown; findings below may reference images not displayed]

FINDINGS: Cardiomediastinal silhouette is stable. No infiltrate or pleural
effusion. No pulmonary edema. Mild degenerative changes thoracic
spine.
IMPRESSION: No active cardiopulmonary disease.

## 2017-06-14 DIAGNOSIS — R072 Precordial pain: Secondary | ICD-10-CM | POA: Diagnosis not present

## 2017-06-14 DIAGNOSIS — I10 Essential (primary) hypertension: Secondary | ICD-10-CM | POA: Diagnosis not present

## 2017-06-18 ENCOUNTER — Other Ambulatory Visit: Payer: Self-pay | Admitting: Interventional Cardiology

## 2017-06-18 ENCOUNTER — Other Ambulatory Visit: Payer: Self-pay | Admitting: Physician Assistant

## 2017-07-18 ENCOUNTER — Other Ambulatory Visit: Payer: Self-pay | Admitting: Interventional Cardiology

## 2017-07-24 ENCOUNTER — Other Ambulatory Visit: Payer: Self-pay | Admitting: Interventional Cardiology

## 2017-07-28 ENCOUNTER — Other Ambulatory Visit: Payer: Self-pay | Admitting: Interventional Cardiology

## 2017-08-21 DIAGNOSIS — J209 Acute bronchitis, unspecified: Secondary | ICD-10-CM | POA: Diagnosis not present

## 2017-08-21 DIAGNOSIS — J441 Chronic obstructive pulmonary disease with (acute) exacerbation: Secondary | ICD-10-CM | POA: Diagnosis not present

## 2017-08-21 DIAGNOSIS — R062 Wheezing: Secondary | ICD-10-CM | POA: Diagnosis not present

## 2017-08-22 DIAGNOSIS — J449 Chronic obstructive pulmonary disease, unspecified: Secondary | ICD-10-CM | POA: Diagnosis not present

## 2017-08-22 DIAGNOSIS — R601 Generalized edema: Secondary | ICD-10-CM | POA: Diagnosis not present

## 2017-08-22 DIAGNOSIS — I10 Essential (primary) hypertension: Secondary | ICD-10-CM | POA: Diagnosis not present

## 2017-09-12 DIAGNOSIS — R0602 Shortness of breath: Secondary | ICD-10-CM | POA: Diagnosis not present

## 2017-09-12 DIAGNOSIS — J449 Chronic obstructive pulmonary disease, unspecified: Secondary | ICD-10-CM | POA: Diagnosis not present

## 2017-09-12 DIAGNOSIS — I2510 Atherosclerotic heart disease of native coronary artery without angina pectoris: Secondary | ICD-10-CM | POA: Diagnosis not present

## 2017-09-12 DIAGNOSIS — I252 Old myocardial infarction: Secondary | ICD-10-CM | POA: Diagnosis not present

## 2017-09-12 DIAGNOSIS — I10 Essential (primary) hypertension: Secondary | ICD-10-CM | POA: Diagnosis not present

## 2017-09-15 ENCOUNTER — Other Ambulatory Visit: Payer: Self-pay | Admitting: Physician Assistant

## 2017-10-13 DIAGNOSIS — R69 Illness, unspecified: Secondary | ICD-10-CM | POA: Diagnosis not present

## 2017-11-21 DIAGNOSIS — R0602 Shortness of breath: Secondary | ICD-10-CM | POA: Diagnosis not present

## 2017-11-21 DIAGNOSIS — Z8709 Personal history of other diseases of the respiratory system: Secondary | ICD-10-CM | POA: Diagnosis not present

## 2017-11-21 DIAGNOSIS — Z87891 Personal history of nicotine dependence: Secondary | ICD-10-CM | POA: Diagnosis not present

## 2017-11-21 DIAGNOSIS — R05 Cough: Secondary | ICD-10-CM | POA: Diagnosis not present

## 2017-11-21 DIAGNOSIS — I10 Essential (primary) hypertension: Secondary | ICD-10-CM | POA: Diagnosis not present

## 2017-11-22 DIAGNOSIS — Z955 Presence of coronary angioplasty implant and graft: Secondary | ICD-10-CM | POA: Diagnosis not present

## 2017-11-22 DIAGNOSIS — I252 Old myocardial infarction: Secondary | ICD-10-CM | POA: Diagnosis not present

## 2017-11-22 DIAGNOSIS — R0609 Other forms of dyspnea: Secondary | ICD-10-CM | POA: Diagnosis not present

## 2017-11-22 DIAGNOSIS — I2510 Atherosclerotic heart disease of native coronary artery without angina pectoris: Secondary | ICD-10-CM | POA: Diagnosis not present

## 2017-11-22 DIAGNOSIS — I10 Essential (primary) hypertension: Secondary | ICD-10-CM | POA: Diagnosis not present

## 2017-11-22 DIAGNOSIS — E7800 Pure hypercholesterolemia, unspecified: Secondary | ICD-10-CM | POA: Diagnosis not present

## 2017-11-30 DIAGNOSIS — I2510 Atherosclerotic heart disease of native coronary artery without angina pectoris: Secondary | ICD-10-CM | POA: Diagnosis not present

## 2017-11-30 DIAGNOSIS — I10 Essential (primary) hypertension: Secondary | ICD-10-CM | POA: Diagnosis not present

## 2017-11-30 DIAGNOSIS — I252 Old myocardial infarction: Secondary | ICD-10-CM | POA: Diagnosis not present

## 2017-11-30 DIAGNOSIS — R0609 Other forms of dyspnea: Secondary | ICD-10-CM | POA: Diagnosis not present

## 2017-11-30 DIAGNOSIS — Z955 Presence of coronary angioplasty implant and graft: Secondary | ICD-10-CM | POA: Diagnosis not present

## 2017-12-08 DIAGNOSIS — R0683 Snoring: Secondary | ICD-10-CM | POA: Diagnosis not present

## 2017-12-08 DIAGNOSIS — I10 Essential (primary) hypertension: Secondary | ICD-10-CM | POA: Diagnosis not present

## 2017-12-08 DIAGNOSIS — R0602 Shortness of breath: Secondary | ICD-10-CM | POA: Diagnosis not present

## 2017-12-08 DIAGNOSIS — G4719 Other hypersomnia: Secondary | ICD-10-CM | POA: Diagnosis not present

## 2017-12-09 DIAGNOSIS — G40909 Epilepsy, unspecified, not intractable, without status epilepticus: Secondary | ICD-10-CM | POA: Diagnosis not present

## 2017-12-09 DIAGNOSIS — I10 Essential (primary) hypertension: Secondary | ICD-10-CM | POA: Diagnosis not present

## 2017-12-09 DIAGNOSIS — Z125 Encounter for screening for malignant neoplasm of prostate: Secondary | ICD-10-CM | POA: Diagnosis not present

## 2017-12-09 DIAGNOSIS — I2510 Atherosclerotic heart disease of native coronary artery without angina pectoris: Secondary | ICD-10-CM | POA: Diagnosis not present

## 2017-12-09 DIAGNOSIS — G8929 Other chronic pain: Secondary | ICD-10-CM | POA: Diagnosis not present

## 2017-12-09 DIAGNOSIS — Z0000 Encounter for general adult medical examination without abnormal findings: Secondary | ICD-10-CM | POA: Diagnosis not present

## 2017-12-09 DIAGNOSIS — M5442 Lumbago with sciatica, left side: Secondary | ICD-10-CM | POA: Diagnosis not present

## 2017-12-14 DIAGNOSIS — R0600 Dyspnea, unspecified: Secondary | ICD-10-CM | POA: Diagnosis not present

## 2017-12-14 DIAGNOSIS — R0602 Shortness of breath: Secondary | ICD-10-CM | POA: Diagnosis not present

## 2017-12-19 DIAGNOSIS — R0602 Shortness of breath: Secondary | ICD-10-CM | POA: Diagnosis not present

## 2017-12-20 DIAGNOSIS — G40909 Epilepsy, unspecified, not intractable, without status epilepticus: Secondary | ICD-10-CM | POA: Diagnosis not present

## 2017-12-20 DIAGNOSIS — I10 Essential (primary) hypertension: Secondary | ICD-10-CM | POA: Diagnosis not present

## 2017-12-27 DIAGNOSIS — R69 Illness, unspecified: Secondary | ICD-10-CM | POA: Diagnosis not present

## 2017-12-29 DIAGNOSIS — G4719 Other hypersomnia: Secondary | ICD-10-CM | POA: Diagnosis not present

## 2017-12-29 DIAGNOSIS — R0683 Snoring: Secondary | ICD-10-CM | POA: Diagnosis not present

## 2017-12-29 DIAGNOSIS — R0600 Dyspnea, unspecified: Secondary | ICD-10-CM | POA: Diagnosis not present

## 2017-12-29 DIAGNOSIS — I10 Essential (primary) hypertension: Secondary | ICD-10-CM | POA: Diagnosis not present

## 2018-02-15 DIAGNOSIS — I2510 Atherosclerotic heart disease of native coronary artery without angina pectoris: Secondary | ICD-10-CM | POA: Diagnosis not present

## 2018-02-15 DIAGNOSIS — I10 Essential (primary) hypertension: Secondary | ICD-10-CM | POA: Diagnosis not present

## 2018-02-15 DIAGNOSIS — I252 Old myocardial infarction: Secondary | ICD-10-CM | POA: Diagnosis not present

## 2018-02-15 DIAGNOSIS — E7800 Pure hypercholesterolemia, unspecified: Secondary | ICD-10-CM | POA: Diagnosis not present

## 2018-02-15 DIAGNOSIS — Z955 Presence of coronary angioplasty implant and graft: Secondary | ICD-10-CM | POA: Diagnosis not present

## 2018-03-07 DIAGNOSIS — R69 Illness, unspecified: Secondary | ICD-10-CM | POA: Diagnosis not present

## 2018-04-13 DIAGNOSIS — I2510 Atherosclerotic heart disease of native coronary artery without angina pectoris: Secondary | ICD-10-CM | POA: Diagnosis not present

## 2018-04-13 DIAGNOSIS — M5442 Lumbago with sciatica, left side: Secondary | ICD-10-CM | POA: Diagnosis not present

## 2018-04-13 DIAGNOSIS — G8929 Other chronic pain: Secondary | ICD-10-CM | POA: Diagnosis not present

## 2018-04-13 DIAGNOSIS — I10 Essential (primary) hypertension: Secondary | ICD-10-CM | POA: Diagnosis not present

## 2018-04-13 DIAGNOSIS — Z8774 Personal history of (corrected) congenital malformations of heart and circulatory system: Secondary | ICD-10-CM | POA: Diagnosis not present

## 2018-04-13 DIAGNOSIS — G40909 Epilepsy, unspecified, not intractable, without status epilepticus: Secondary | ICD-10-CM | POA: Diagnosis not present

## 2018-04-17 DIAGNOSIS — G8929 Other chronic pain: Secondary | ICD-10-CM | POA: Diagnosis not present

## 2018-04-17 DIAGNOSIS — M5442 Lumbago with sciatica, left side: Secondary | ICD-10-CM | POA: Diagnosis not present

## 2018-06-19 ENCOUNTER — Encounter (HOSPITAL_COMMUNITY): Payer: Self-pay | Admitting: Emergency Medicine

## 2018-06-19 ENCOUNTER — Observation Stay (HOSPITAL_COMMUNITY)
Admission: EM | Admit: 2018-06-19 | Discharge: 2018-06-21 | Disposition: A | Discharge status: Home / Self Care | Payer: Medicare HMO | Attending: Internal Medicine | Admitting: Internal Medicine

## 2018-06-19 DIAGNOSIS — Z8679 Personal history of other diseases of the circulatory system: Secondary | ICD-10-CM

## 2018-06-19 DIAGNOSIS — Z79899 Other long term (current) drug therapy: Secondary | ICD-10-CM | POA: Diagnosis not present

## 2018-06-19 DIAGNOSIS — I700 Atherosclerosis of aorta: Secondary | ICD-10-CM | POA: Insufficient documentation

## 2018-06-19 DIAGNOSIS — I4891 Unspecified atrial fibrillation: Principal | ICD-10-CM | POA: Diagnosis present

## 2018-06-19 DIAGNOSIS — R0689 Other abnormalities of breathing: Secondary | ICD-10-CM | POA: Diagnosis not present

## 2018-06-19 DIAGNOSIS — R202 Paresthesia of skin: Secondary | ICD-10-CM | POA: Insufficient documentation

## 2018-06-19 DIAGNOSIS — K219 Gastro-esophageal reflux disease without esophagitis: Secondary | ICD-10-CM | POA: Diagnosis not present

## 2018-06-19 DIAGNOSIS — I252 Old myocardial infarction: Secondary | ICD-10-CM | POA: Insufficient documentation

## 2018-06-19 DIAGNOSIS — R0602 Shortness of breath: Secondary | ICD-10-CM | POA: Diagnosis not present

## 2018-06-19 DIAGNOSIS — Z1159 Encounter for screening for other viral diseases: Secondary | ICD-10-CM | POA: Insufficient documentation

## 2018-06-19 DIAGNOSIS — Z7902 Long term (current) use of antithrombotics/antiplatelets: Secondary | ICD-10-CM | POA: Insufficient documentation

## 2018-06-19 DIAGNOSIS — Z87798 Personal history of other (corrected) congenital malformations: Secondary | ICD-10-CM | POA: Diagnosis not present

## 2018-06-19 DIAGNOSIS — I491 Atrial premature depolarization: Secondary | ICD-10-CM | POA: Diagnosis not present

## 2018-06-19 DIAGNOSIS — J449 Chronic obstructive pulmonary disease, unspecified: Secondary | ICD-10-CM | POA: Diagnosis not present

## 2018-06-19 DIAGNOSIS — Z7982 Long term (current) use of aspirin: Secondary | ICD-10-CM | POA: Diagnosis not present

## 2018-06-19 DIAGNOSIS — Z87891 Personal history of nicotine dependence: Secondary | ICD-10-CM | POA: Diagnosis not present

## 2018-06-19 DIAGNOSIS — I69811 Memory deficit following other cerebrovascular disease: Secondary | ICD-10-CM | POA: Diagnosis not present

## 2018-06-19 DIAGNOSIS — R569 Unspecified convulsions: Secondary | ICD-10-CM

## 2018-06-19 DIAGNOSIS — I499 Cardiac arrhythmia, unspecified: Secondary | ICD-10-CM | POA: Diagnosis not present

## 2018-06-19 DIAGNOSIS — I10 Essential (primary) hypertension: Secondary | ICD-10-CM | POA: Diagnosis not present

## 2018-06-19 DIAGNOSIS — E785 Hyperlipidemia, unspecified: Secondary | ICD-10-CM | POA: Insufficient documentation

## 2018-06-19 DIAGNOSIS — Z825 Family history of asthma and other chronic lower respiratory diseases: Secondary | ICD-10-CM | POA: Insufficient documentation

## 2018-06-19 DIAGNOSIS — Z8249 Family history of ischemic heart disease and other diseases of the circulatory system: Secondary | ICD-10-CM | POA: Diagnosis not present

## 2018-06-19 DIAGNOSIS — Z888 Allergy status to other drugs, medicaments and biological substances status: Secondary | ICD-10-CM | POA: Diagnosis not present

## 2018-06-19 DIAGNOSIS — I2510 Atherosclerotic heart disease of native coronary artery without angina pectoris: Secondary | ICD-10-CM | POA: Diagnosis not present

## 2018-06-19 DIAGNOSIS — Z955 Presence of coronary angioplasty implant and graft: Secondary | ICD-10-CM | POA: Insufficient documentation

## 2018-06-19 DIAGNOSIS — R069 Unspecified abnormalities of breathing: Secondary | ICD-10-CM | POA: Diagnosis not present

## 2018-06-19 DIAGNOSIS — I420 Dilated cardiomyopathy: Secondary | ICD-10-CM | POA: Insufficient documentation

## 2018-06-19 NOTE — ED Triage Notes (Signed)
Patient arrived with EMS from home reports SOB this evening , Afib/RVR on the monitor by EMS , denies chest pain , no cough or fever , history of CAD/coronary stents.

## 2018-06-20 ENCOUNTER — Other Ambulatory Visit: Payer: Self-pay

## 2018-06-20 ENCOUNTER — Observation Stay (HOSPITAL_BASED_OUTPATIENT_CLINIC_OR_DEPARTMENT_OTHER): Payer: Medicare HMO

## 2018-06-20 ENCOUNTER — Emergency Department (HOSPITAL_COMMUNITY): Payer: Medicare HMO

## 2018-06-20 ENCOUNTER — Encounter (HOSPITAL_COMMUNITY): Payer: Self-pay | Admitting: *Deleted

## 2018-06-20 DIAGNOSIS — R9431 Abnormal electrocardiogram [ECG] [EKG]: Secondary | ICD-10-CM

## 2018-06-20 DIAGNOSIS — I4891 Unspecified atrial fibrillation: Secondary | ICD-10-CM

## 2018-06-20 DIAGNOSIS — I619 Nontraumatic intracerebral hemorrhage, unspecified: Secondary | ICD-10-CM

## 2018-06-20 DIAGNOSIS — E782 Mixed hyperlipidemia: Secondary | ICD-10-CM

## 2018-06-20 DIAGNOSIS — I25118 Atherosclerotic heart disease of native coronary artery with other forms of angina pectoris: Secondary | ICD-10-CM

## 2018-06-20 DIAGNOSIS — R0602 Shortness of breath: Secondary | ICD-10-CM | POA: Diagnosis not present

## 2018-06-20 LAB — CBC WITH DIFFERENTIAL/PLATELET
Abs Immature Granulocytes: 0.02 10*3/uL (ref 0.00–0.07)
Basophils Absolute: 0.1 10*3/uL (ref 0.0–0.1)
Basophils Relative: 1 %
Eosinophils Absolute: 0.2 10*3/uL (ref 0.0–0.5)
Eosinophils Relative: 2 %
HCT: 52.4 % — ABNORMAL HIGH (ref 39.0–52.0)
Hemoglobin: 17.7 g/dL — ABNORMAL HIGH (ref 13.0–17.0)
Immature Granulocytes: 0 %
Lymphocytes Relative: 14 %
Lymphs Abs: 1.5 10*3/uL (ref 0.7–4.0)
MCH: 29.3 pg (ref 26.0–34.0)
MCHC: 33.8 g/dL (ref 30.0–36.0)
MCV: 86.6 fL (ref 80.0–100.0)
Monocytes Absolute: 1.3 10*3/uL — ABNORMAL HIGH (ref 0.1–1.0)
Monocytes Relative: 12 %
Neutro Abs: 7.4 10*3/uL (ref 1.7–7.7)
Neutrophils Relative %: 71 %
Platelets: 207 10*3/uL (ref 150–400)
RBC: 6.05 MIL/uL — ABNORMAL HIGH (ref 4.22–5.81)
RDW: 12.7 % (ref 11.5–15.5)
WBC: 10.4 10*3/uL (ref 4.0–10.5)
nRBC: 0 % (ref 0.0–0.2)

## 2018-06-20 LAB — BASIC METABOLIC PANEL
Anion gap: 16 — ABNORMAL HIGH (ref 5–15)
BUN: 10 mg/dL (ref 6–20)
CO2: 22 mmol/L (ref 22–32)
Calcium: 9.7 mg/dL (ref 8.9–10.3)
Chloride: 103 mmol/L (ref 98–111)
Creatinine, Ser: 1.04 mg/dL (ref 0.61–1.24)
GFR calc Af Amer: >60 mL/min (ref >60)
GFR calc non Af Amer: >60 mL/min (ref >60)
Glucose, Bld: 114 mg/dL — ABNORMAL HIGH (ref 70–99)
Potassium: 3.8 mmol/L (ref 3.5–5.1)
Sodium: 141 mmol/L (ref 135–145)

## 2018-06-20 LAB — ECHOCARDIOGRAM LIMITED
Height: 71 in
Weight: 3902.4 oz

## 2018-06-20 LAB — TROPONIN I
Troponin I: <0.03 ng/mL (ref <0.03)
Troponin I: <0.03 ng/mL (ref <0.03)
Troponin I: <0.03 ng/mL (ref <0.03)
Troponin I: <0.03 ng/mL (ref <0.03)

## 2018-06-20 LAB — TSH: TSH: 1.332 u[IU]/mL (ref 0.350–4.500)

## 2018-06-20 LAB — HEPARIN LEVEL (UNFRACTIONATED): Heparin Unfractionated: 0.11 IU/mL — ABNORMAL LOW (ref 0.30–0.70)

## 2018-06-20 LAB — MAGNESIUM: Magnesium: 2.1 mg/dL (ref 1.7–2.4)

## 2018-06-20 LAB — SARS CORONAVIRUS 2 BY RT PCR (HOSPITAL ORDER, PERFORMED IN ~~LOC~~ HOSPITAL LAB): SARS Coronavirus 2: NEGATIVE

## 2018-06-20 MED ORDER — SODIUM CHLORIDE 0.9% FLUSH: 3.0000 mL | Freq: Two times a day (BID) | INTRAVENOUS | Status: DC

## 2018-06-20 MED ORDER — HYDROCODONE-ACETAMINOPHEN 10-325 MG PO TABS
1.0000 | ORAL_TABLET | Freq: Four times a day (QID) | ORAL | Status: DC | PRN
  Administered 2018-06-20 – 2018-06-21 (×5): 1 via ORAL
  Filled 2018-06-20 (×5): qty 1

## 2018-06-20 MED ORDER — ISOSORBIDE MONONITRATE ER 30 MG PO TB24
30.0000 mg | ORAL_TABLET | Freq: Every day | ORAL | Status: DC
  Administered 2018-06-20 – 2018-06-21 (×2): 30 mg via ORAL
  Filled 2018-06-20 (×2): qty 1

## 2018-06-20 MED ORDER — APIXABAN 5 MG PO TABS
5.0000 mg | ORAL_TABLET | Freq: Two times a day (BID) | ORAL | Status: DC
  Administered 2018-06-20 – 2018-06-21 (×2): 5 mg via ORAL
  Filled 2018-06-20 (×2): qty 1

## 2018-06-20 MED ORDER — DILTIAZEM HCL-DEXTROSE 100-5 MG/100ML-% IV SOLN (PREMIX)
5.0000 mg/h | INTRAVENOUS | Status: DC
  Administered 2018-06-20 (×3): 5 mg/h via INTRAVENOUS
  Administered 2018-06-21: 7.5 mg/h via INTRAVENOUS
  Filled 2018-06-20 (×3): qty 100

## 2018-06-20 MED ORDER — ROSUVASTATIN CALCIUM 20 MG PO TABS
40.0000 mg | ORAL_TABLET | Freq: Every day | ORAL | Status: DC
  Administered 2018-06-20: 40 mg via ORAL
  Filled 2018-06-20: qty 2

## 2018-06-20 MED ORDER — HEPARIN (PORCINE) 25000 UT/250ML-% IV SOLN
1350.0000 [IU]/h | INTRAVENOUS | Status: DC
  Administered 2018-06-20: 1350 [IU]/h via INTRAVENOUS
  Filled 2018-06-20: qty 250

## 2018-06-20 MED ORDER — SODIUM CHLORIDE 0.9 % IV SOLN: INTRAVENOUS | Status: DC

## 2018-06-20 MED ORDER — DILTIAZEM LOAD VIA INFUSION
20.0000 mg | Freq: Once | INTRAVENOUS | Status: AC
  Administered 2018-06-20: 20 mg via INTRAVENOUS
  Filled 2018-06-20: qty 20

## 2018-06-20 MED ORDER — OXCARBAZEPINE 300 MG PO TABS
600.0000 mg | ORAL_TABLET | Freq: Two times a day (BID) | ORAL | Status: DC
  Administered 2018-06-20 – 2018-06-21 (×3): 600 mg via ORAL
  Filled 2018-06-20: qty 4
  Filled 2018-06-20: qty 2
  Filled 2018-06-20: qty 4
  Filled 2018-06-20 (×3): qty 2
  Filled 2018-06-20: qty 4

## 2018-06-20 MED ORDER — SODIUM CHLORIDE 0.9 % IV SOLN: 250.0000 mL | INTRAVENOUS | Status: DC

## 2018-06-20 MED ORDER — LORAZEPAM 2 MG/ML IJ SOLN: 1.0000 mg | INTRAMUSCULAR | Status: DC | PRN

## 2018-06-20 MED ORDER — EZETIMIBE 10 MG PO TABS
10.0000 mg | ORAL_TABLET | Freq: Every day | ORAL | Status: DC
  Administered 2018-06-20 – 2018-06-21 (×2): 10 mg via ORAL
  Filled 2018-06-20 (×2): qty 1

## 2018-06-20 MED ORDER — SODIUM CHLORIDE 0.9% FLUSH: 3.0000 mL | INTRAVENOUS | Status: DC | PRN

## 2018-06-20 MED ORDER — CLOPIDOGREL BISULFATE 75 MG PO TABS
75.0000 mg | ORAL_TABLET | Freq: Every day | ORAL | Status: DC
  Administered 2018-06-20 – 2018-06-21 (×2): 75 mg via ORAL
  Filled 2018-06-20 (×2): qty 1

## 2018-06-20 NOTE — Care Management Obs Status (Signed)
[place] NOTIFICATION   Patient Details  Name: Peter Barnett MRN: [id number] Date of Birth: October 08, 1960   Medicare Observation Status Notification Given:  Yes    Bethena Roys, RN 06/20/2018, 4:17 PM

## 2018-06-20 NOTE — ED Notes (Signed)
Called to give report, told will call me back

## 2018-06-20 NOTE — Progress Notes (Signed)
  Echocardiogram 2D Echocardiogram has been performed.  Jennette Dubin 06/20/2018, 10:16 AM

## 2018-06-20 NOTE — ED Provider Notes (Signed)
[facility] EMERGENCY DEPARTMENT Provider Note   CSN: [id number] Arrival date & time: 06/19/18  2342    History   Chief Complaint Chief Complaint  Patient presents with  . Shortness of Breath    HPI Peter Barnett is a 58 y.o. male.     The history is provided by the patient.  Shortness of Breath  Severity:  Moderate Onset quality:  Gradual Duration:  24 hours Timing:  Intermittent Progression:  Worsening Chronicity:  New Relieved by:  Nothing Worsened by:  Nothing Associated symptoms: no abdominal pain, no chest pain, no cough, no fever and no vomiting   Patient with history of CAD, COPD, liver loss, intracranial hemorrhage presents with tachycardia/shortness of breath He reports about 24 hours ago he had onset of shortness of breath.  He thought it was a COPD to use albuterol.  He reports several hours ago he noted his heart was racing.  His wife checked his heart rate and appeared irregular.  No chest pain.  No fever or cough.  Denies known history of afib Past Medical History:  Diagnosis Date  . CAD (coronary artery disease)    a. MI/DES to Cx 2009 with staged PCI of LAD with DES and distal RCA with BMS. b. NSTEMI 12/2015 s/p DES to D1, known occlusion of dRCA tx medically.  . Chronic lower back pain    Still with recurrent left sided LBP with paresthesias down left leg --primarily with activities: pain pill use is avg <90 tabs per mo  . COPD (chronic obstructive pulmonary disease) ([place])    NOTED on CT chest 11/2013 done for lung ca screening.  Hosp for acute exac 11/2014.  Spirometry not supportive of this dx as of pulm eval 2017, though.  Spireva trial by Dr. Elsworth Soho 02/2015.  Marland Kitchen GERD (gastroesophageal reflux disease)   . Heart attack ([place]) 12/12/2015  . History of adenomatous polyp of colon 02/2009;02/2014   Recall 5 yrs ([facility])  . History of kidney stones   . HTN (hypertension)   . Hyperlipidemia   . Intracranial hemorrhage  ([place]) 12/27/14   small acute hemorrhage in gliotic medial R frontal lobe  . Memory loss   . MI (myocardial infarction) ([place]) 2009   "just had arm pain; never any chest pain"  . Migraine syndrome 10/07/2011    a couple per month usually, but worsening 2017 so Dr. Krista Blue adjusted med  . 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 Seizures ([place])    last one was fall of 2016.  Complex partial sz d/o.  Marland Kitchen Short-term memory loss 10/07/2011   "post brain OR & from his seizures"--worsening 2017, Dr. Krista Blue in neurology doing w/u.  Marland Kitchen Stroke ([employer]) 12/2014  . Tobacco abuse   . Urethral stricture since 1981   Recurrent dilation has been required [facility])    Patient Active Problem List   Diagnosis Date Noted  . History of seizure 02/15/2017  . S/P coronary artery stent placement 01/28/2017  . Chronic bilateral low back pain with left-sided sciatica 06/09/2016  . Obesity (BMI 30-39.9) 06/07/2016  . NSTEMI (non-ST elevated myocardial infarction) ([place])   . Essential hypertension   . Pure hypercholesterolemia   . Herniated lumbar intervertebral disc 11/27/2015  . Lumbar canal stenosis 11/25/2015  . Left lumbar radiculopathy 06/17/2015  . Migraine 06/17/2015  . Hemorrhage, intracranial ([place]) 01/01/2015  . COPD (chronic obstructive pulmonary disease) ([place]) 12/27/2014  . Intracranial bleeding ([place]) 12/27/2014  . GERD (gastroesophageal reflux disease)  12/27/2014  . Left facial numbness 12/25/2014  . COPD exacerbation ([place]) 12/07/2014  . DOE (dyspnea on exertion) 12/05/2014  . Fever, unspecified 11/29/2014  . Cough 11/28/2014  . Right flank pain 03/28/2014  . Prostate cancer screening 12/05/2013  . 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 Coronary atherosclerosis of native coronary artery 10/25/2013  . HTN (hypertension), benign 08/29/2013  . Hyperlipidemia 08/29/2013  . Venous insufficiency of both lower extremities 08/29/2013  . History of adenomatous polyp of colon 08/29/2013  . Memory loss  10/06/2012  . Seizures ([place]) 10/06/2012  . H/O craniotomy 10/06/2012  . Arm paresthesia, left 10/07/2011  . Left arm pain 10/07/2011  . 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 Colon polyp 01/14/2011  . H/O Spinal surgery 01/14/2011  . Cerebral seizure 01/14/2011  . Arteriosclerosis of coronary artery 01/14/2011  . Personal history of arterial venous malformation (AVM) 01/14/2011  . Breath shortness 03/25/2009    Past Surgical History:  Procedure Laterality Date  . BACK SURGERY  11/25/2015  . CARDIAC CATHETERIZATION  2011   [facility], records unavailable: pt reports "normal".  . CARDIAC CATHETERIZATION N/A 12/15/2015   Procedure: Left Heart Cath and Coronary Angiography;  Surgeon: Peter M Martinique, MD;  Location: [place] [facility] CV LAB;  Service: Cardiovascular;  Laterality: N/A;  . CARDIAC CATHETERIZATION N/A 12/15/2015   Procedure: Coronary Stent Intervention;  Surgeon: Peter M Martinique, MD;  Location: [place] CV LAB;  Service: Cardiovascular;  Laterality: N/A;  . COLONOSCOPY W/ POLYPECTOMY  02/2009;02/2014   Recall 5 yr (aden poly, hyperplast polyp, int and ext hemorr).  Next recall is 02/2019.  Marland Kitchen CORONARY ANGIOPLASTY WITH STENT PLACEMENT  2009   3 DES to LCx; still with known distal RCA/PDA occlusion.  EF 60% by LV-gram.  . CRANIOTOMY FOR AVM  1981   Dx'd after pt began having seizures.  . CT ANGIOGRAM (CEREBRAL)  12/31/14   NORMAL--plan per neuro is to repeat this in 3 mo.  Pt states it was repeated Spring 2017 and was "fine"  . [place] SURGERY  2008; 2009   Dr. Joya Salm  . LUMBAR LAMINECTOMY/DECOMPRESSION MICRODISCECTOMY N/A 11/25/2015   Procedure: Lumbar three- four Lumbar four- five Laminectomy/Foraminotomy;  Surgeon: Leeroy Cha, MD;  Location: [facility];  Service: Neurosurgery;  Laterality: N/A;  L3-4 L4-5 Laminectomy/Foraminotomy/possible Lumbar Drain  . TONSILLECTOMY     "I was little"  . TRANSTHORACIC ECHOCARDIOGRAM  12/07/14   Normal LV size/fxn, EF 60%,  normal valves.        Home Medications    Prior to Admission medications   Medication Sig Start Date End Date Taking? Authorizing Provider  acetaminophen (TYLENOL) 500 MG tablet Take 1 tablet (500 mg total) by mouth every 6 (six) hours as needed for headache. 12/16/15   Cheryln Manly, NP  albuterol (PROVENTIL HFA;VENTOLIN HFA) 108 (90 BASE) MCG/ACT inhaler Inhale 2 puffs into the lungs every 6 (six) hours as needed for wheezing or shortness of breath. 12/08/14   Reyne Dumas, MD  aspirin EC 81 MG tablet Take 81 mg by mouth daily.    [provider]  butalbital-acetaminophen-caffeine (FIORICET, ESGIC) [phone] MG tablet Take 1 tablet by mouth every 6 (six) hours as needed for headache. 06/17/15   Marcial Pacas, MD  Cholecalciferol 1000 units tablet Take 1,000 Units by mouth daily.    [provider]  clopidogrel (PLAVIX) 75 MG tablet Take 1 tablet (75 mg total) by mouth daily. Please make overdue appt with Dr. Irish Lack before anymore refills. 2nd  attempt 06/20/17   Charlie Pitter, PA-C  clopidogrel (PLAVIX) 75 MG tablet TAKE 1 TABLET BY MOUTH EVERY DAY 09/15/17   Dunn, Nedra Hai, PA-C  docusate sodium (COLACE) 100 MG capsule Take 200 mg by mouth daily.     [provider]  famotidine (PEPCID) 20 MG tablet Take 20 mg by mouth daily.    [provider]  HYDROcodone-acetaminophen (NORCO) 10-325 MG per tablet Take 1 tablet by mouth every 6 (six) hours as needed (pain).  08/07/14   [provider]  isosorbide mononitrate (IMDUR) 30 MG 24 hr tablet Take 1 tablet (30 mg total) by mouth daily. Please make overdue appt with Dr. Irish Lack for future refills. 2nd attempt. 07/28/17   Jettie Booze, MD  metoprolol tartrate (LOPRESSOR) 25 MG tablet TAKE 1/2 TABLET BY MOUTH TWICE DAILY 02/03/16   McGowen, Adrian Blackwater, MD  nitroGLYCERIN (NITROSTAT) 0.4 MG SL tablet Place 1 tablet (0.4 mg total) under the tongue every 5 (five) minutes as needed for chest pain (CP or SOB).  12/16/15 02/12/19  Jettie Booze, MD  oxcarbazepine (TRILEPTAL) 600 MG tablet TAKE ONE TABLET BY MOUTH TWICE DAILY 06/23/16   Marcial Pacas, MD  rosuvastatin (CRESTOR) 40 MG tablet Take 1 tablet (40 mg total) by mouth daily. 07/25/17   Jettie Booze, MD  vitamin B-12 (CYANOCOBALAMIN) 1000 MCG tablet Take 1,000 mcg by mouth daily.    [provider]    Family History Family History  Problem Relation Age of Onset  . Hyperlipidemia Mother   . Cancer Father        Mesothelioma  . Hypertension Father   . Asthma Father   . Heart attack Paternal Uncle        In 29s too  . Stroke Neg Hx     Social History Social History   Tobacco Use  . Smoking status: Former Smoker    Packs/day: 3.00    Years: 27.00    Pack years: 81.00    Types: Cigarettes    Last attempt to quit: 02/08/2001    Years since quitting: 17.3  . Smokeless tobacco: Never Used  Substance Use Topics  . Alcohol use: Yes    Alcohol/week: 7.0 standard drinks    Types: 7 Cans of beer per week    Comment: 12 oz beer   . Drug use: No     Allergies   Tramadol and Atorvastatin   Review of Systems Review of Systems  Constitutional: Negative for fever.  Respiratory: Positive for shortness of breath. Negative for cough.   Cardiovascular: Negative for chest pain.  Gastrointestinal: Negative for abdominal pain and vomiting.  All other systems reviewed and are negative.    Physical Exam Updated Vital Signs BP 113/76   Pulse 92   Temp 98.2 F (36.8 C) (Oral)   Resp (!) 23   Ht 1.803 m (5\' 11" )   Wt 112.5 kg   SpO2 95%   BMI 34.59 kg/m  Initial Heart rate - 130 Physical Exam CONSTITUTIONAL: Well developed/well nourished HEAD: Normocephalic/atraumatic EYES: EOMI/PERRL ENMT: Mucous membranes moist NECK: supple no meningeal signs SPINE/BACK:entire spine nontender CV: Tachycardic and irregular LUNGS: Lungs are clear to auscultation bilaterally, no apparent distress ABDOMEN: soft, nontender, no  rebound or guarding, bowel sounds noted throughout abdomen GU:no cva tenderness NEURO: Pt is awake/alert/appropriate, moves all extremitiesx4.  No facial droop.   EXTREMITIES: pulses normal/equal, full ROM SKIN: warm, color normal PSYCH: no abnormalities of mood noted, alert and oriented to  situation   ED Treatments / Results  Labs (all labs ordered are listed, but only abnormal results are displayed) Labs Reviewed  BASIC METABOLIC PANEL - Abnormal; Notable for the following components:      Result Value   Glucose, Bld 114 (*)    Anion gap 16 (*)    All other components within normal limits  CBC WITH DIFFERENTIAL/PLATELET - Abnormal; Notable for the following components:   RBC 6.05 (*)    Hemoglobin 17.7 (*)    HCT 52.4 (*)    Monocytes Absolute 1.3 (*)    All other components within normal limits  SARS CORONAVIRUS 2 (HOSPITAL ORDER, [facility])  TROPONIN I  MAGNESIUM  TROPONIN I  TROPONIN I  TROPONIN I  TSH    EKG EKG Interpretation  Date/Time:  Tuesday Jun 20 2018 00:40:26 EDT Ventricular Rate:  130 PR Interval:    QRS Duration: 117 QT Interval:  345 QTC Calculation: 508 R Axis:   -48 Text Interpretation:  Atrial fibrillation Left anterior fascicular block LVH with secondary repolarization abnormality changed from prior Confirmed by Ripley Fraise ([id number]) on 06/20/2018 12:50:38 AM   Radiology Dg Chest Port 1 View  Result Date: 06/20/2018 CLINICAL DATA:  Shortness of breath EXAM: PORTABLE CHEST 1 VIEW COMPARISON:  12/12/2015 FINDINGS: The heart size and mediastinal contours are within normal limits. Both lungs are clear. The visualized skeletal structures are unremarkable. IMPRESSION: No active disease. Electronically Signed   By: Ulyses Jarred M.D.   On: 06/20/2018 00:36    Procedures Procedures  CRITICAL CARE Performed by: Sharyon Cable Total critical care time: 35 minutes Critical care time was exclusive of separately  billable procedures and treating other patients. Critical care was necessary to treat or prevent imminent or life-threatening deterioration. Critical care was time spent personally by me on the following activities: development of treatment plan with patient and/or surrogate as well as nursing, discussions with consultants, evaluation of patient's response to treatment, examination of patient, obtaining history from patient or surrogate, ordering and performing treatments and interventions, ordering and review of laboratory studies, ordering and review of radiographic studies, pulse oximetry and re-evaluation of patient's condition. Patient with  atrial fibrillation with rapid ventricular response requiring IV Cardizem drip and IV Cardizem bolus  Medications Ordered in ED Medications  diltiazem (CARDIZEM) 1 mg/mL load via infusion 20 mg (20 mg Intravenous Bolus from Bag 06/20/18 0111)    And  diltiazem (CARDIZEM) 100 mg in dextrose 5% 135mL (1 mg/mL) infusion (5 mg/hr Intravenous New Bag/Given 06/20/18 0110)     Initial Impression / Assessment and Plan / ED Course  I have reviewed the triage vital signs and the nursing notes.  Pertinent labs & imaging results that were available during my care of the patient were reviewed by me and considered in my medical decision making (see chart for details).        12:10 AM Patient presents with shortness of breath and what appears to be A. fib with RVR.  No previous history of A. fib.  Patient reports history of memory loss, therefore I feel his history is unreliable.  His shortness of breath started at least 24 hours ago, but he does not recall the tachycardia until about 2 or 3 hours ago At this point I would not feel comfortable initiating emergency department electrical cardioversion.  Will focus on rate control and will be admitted to the hospital 1:42 AM Patient still in atrial fibrillation, but heart  rate is improved. Plan for admission. 2:00  AM Discussed the case with Dr. Blaine Hamper for admission.  This patients CHA2DS2-VASc Score and unadjusted Ischemic Stroke Rate (% per year) is equal to 4.8 % stroke rate/year from a score of 4  Above score calculated as 1 point each if present [CHF, HTN, DM, Vascular=MI/PAD/Aortic Plaque, Age if 65-74, or Male] Above score calculated as 2 points each if present [Age > 75, or Stroke/TIA/TE]   Final Clinical Impressions(s) / ED Diagnoses   Final diagnoses:  Atrial fibrillation with rapid ventricular response [facility])    ED Discharge Orders         Ordered    Amb referral to AFIB Clinic     06/20/18 0005           Ripley Fraise, MD 06/20/18 0200

## 2018-06-20 NOTE — ED Notes (Signed)
ED TO INPATIENT HANDOFF REPORT  ED Nurse Name and Phone #:  [phone]  S Name/Age/Gender Peter Barnett 58 y.o. male Room/Bed: 034C/034C  Code Status   Code Status: Prior  Home/SNF/Other Home Patient oriented to: self, place, time and situation Is this baseline? Yes   Triage Complete: Triage complete  Chief Complaint new onset AFIB  Triage Note Patient arrived with EMS from home reports SOB this evening , Afib/RVR on the monitor by EMS , denies chest pain , no cough or fever , history of CAD/coronary stents.   Allergies Allergies  Allergen Reactions  . Tramadol Other (See Comments)    SEIZURES "Staring off spells"  . Atorvastatin Other (See Comments)    MYALGIAS    Level of Care/Admitting Diagnosis ED Disposition    ED Disposition Condition [facility]: [place] [100100]  Level of Care: Progressive [102]  I expect the patient will be discharged within 24 hours: No (not a candidate for 5C-Observation unit)  Covid Evaluation: Screening Protocol (No Symptoms)  Diagnosis: Atrial fibrillation with RVR [facility]) [160109]  Admitting Physician: Ivor Costa [[id number]]  Attending Physician: Ivor Costa [[id number]]  PT Class (Do Not Modify): Observation [104]  PT Acc Code (Do Not Modify): Observation [10022]       B Medical/Surgery History Past Medical History:  Diagnosis Date  . CAD (coronary artery disease)    a. MI/DES to Cx 2009 with staged PCI of LAD with DES and distal RCA with BMS. b. NSTEMI 12/2015 s/p DES to D1, known occlusion of dRCA tx medically.  . Chronic lower back pain    Still with recurrent left sided LBP with paresthesias down left leg --primarily with activities: pain pill use is avg <90 tabs per mo  . COPD (chronic obstructive pulmonary disease) ([place])    NOTED on CT chest 11/2013 done for lung ca screening.  Hosp for acute exac 11/2014.  Spirometry not supportive of this dx as of pulm eval 2017, though.  Spireva trial by  Dr. Elsworth Soho 02/2015.  Marland Kitchen GERD (gastroesophageal reflux disease)   . Heart attack ([place]) 12/12/2015  . History of adenomatous polyp of colon 02/2009;02/2014   Recall 5 yrs ([facility])  . History of kidney stones   . HTN (hypertension)   . Hyperlipidemia   . Intracranial hemorrhage ([place]) 12/27/14   small acute hemorrhage in gliotic medial R frontal lobe  . Memory loss   . MI (myocardial infarction) ([place]) 2009   "just had arm pain; never any chest pain"  . Migraine syndrome 10/07/2011    a couple per month usually, but worsening 2017 so Dr. Krista Blue adjusted med  . 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 Seizures ([place])    last one was fall of 2016.  Complex partial sz d/o.  Marland Kitchen Short-term memory loss 10/07/2011   "post brain OR & from his seizures"--worsening 2017, Dr. Krista Blue in neurology doing w/u.  Marland Kitchen Stroke ([place]) 12/2014  . Tobacco abuse   . Urethral stricture since 1981   Recurrent dilation has been required [facility] urology)   Past Surgical History:  Procedure Laterality Date  . BACK SURGERY  11/25/2015  . CARDIAC CATHETERIZATION  2011   [place], records unavailable: pt reports "normal".  . CARDIAC CATHETERIZATION N/A 12/15/2015   Procedure: Left Heart Cath and Coronary Angiography;  Surgeon: Peter M Martinique, MD;  Location: [place] CV LAB;  Service: Cardiovascular;  Laterality: N/A;  . CARDIAC CATHETERIZATION N/A 12/15/2015  Procedure: Coronary Stent Intervention;  Surgeon: Peter M Martinique, MD;  Location: [place] CV LAB;  Service: Cardiovascular;  Laterality: N/A;  . COLONOSCOPY W/ POLYPECTOMY  02/2009;02/2014   Recall 5 yr (aden poly, hyperplast polyp, int and ext hemorr).  Next recall is 02/2019.  Marland Kitchen CORONARY ANGIOPLASTY WITH STENT PLACEMENT  2009   3 DES to LCx; still with known distal RCA/PDA occlusion.  EF 60% by LV-gram.  . CRANIOTOMY FOR AVM  1981   Dx'd after pt began having seizures.  . CT ANGIOGRAM (CEREBRAL)  12/31/14   NORMAL--plan per neuro is to repeat this  in 3 mo.  Pt states it was repeated Spring 2017 and was "fine"  . [place] SURGERY  2008; 2009   Dr. Joya Salm  . LUMBAR LAMINECTOMY/DECOMPRESSION MICRODISCECTOMY N/A 11/25/2015   Procedure: Lumbar three- four Lumbar four- five Laminectomy/Foraminotomy;  Surgeon: Leeroy Cha, MD;  Location: [place];  Service: Neurosurgery;  Laterality: N/A;  L3-4 L4-5 Laminectomy/Foraminotomy/possible Lumbar Drain  . TONSILLECTOMY     "I was little"  . TRANSTHORACIC ECHOCARDIOGRAM  12/07/14   Normal LV size/fxn, EF 60%, normal valves.     A IV Location/Drains/Wounds Patient Lines/Drains/Airways Status   Active Line/Drains/Airways    Name:   Placement date:   Placement time:   Site:   Days:   Peripheral IV 06/20/18 Right Antecubital   06/20/18    0010    Antecubital   less than 1          Intake/Output Last 24 hours No intake or output data in the 24 hours ending 06/20/18 0210  Labs/Imaging Results for orders placed or performed during the hospital encounter of 06/19/18 (from the past 48 hour(s))  Basic metabolic panel     Status: Abnormal   Collection Time: 06/20/18 12:07 AM  Result Value Ref Range   Sodium 141 135 - 145 mmol/L   Potassium 3.8 3.5 - 5.1 mmol/L   Chloride 103 98 - 111 mmol/L   CO2 22 22 - 32 mmol/L   Glucose, Bld 114 (H) 70 - 99 mg/dL   BUN 10 6 - 20 mg/dL   Creatinine, Ser 1.04 0.61 - 1.24 mg/dL   Calcium 9.7 8.9 - 10.3 mg/dL   GFR calc non Af Amer >60 >60 mL/min   GFR calc Af Amer >60 >60 mL/min   Anion gap 16 (H) 5 - 15    Comment: Performed at [facility], [place] [address], [place], [place] [address]  CBC with Differential/Platelet     Status: Abnormal   Collection Time: 06/20/18 12:07 AM  Result Value Ref Range   WBC 10.4 4.0 - 10.5 K/uL   RBC 6.05 (H) 4.22 - 5.81 MIL/uL   Hemoglobin 17.7 (H) 13.0 - 17.0 g/dL   HCT 52.4 (H) 39.0 - 52.0 %   MCV 86.6 80.0 - 100.0 fL   MCH 29.3 26.0 - 34.0 pg   MCHC 33.8 30.0 - 36.0 g/dL   RDW 12.7 11.5 - 15.5 %   Platelets  207 150 - 400 K/uL   nRBC 0.0 0.0 - 0.2 %   Neutrophils Relative % 71 %   Neutro Abs 7.4 1.7 - 7.7 K/uL   Lymphocytes Relative 14 %   Lymphs Abs 1.5 0.7 - 4.0 K/uL   Monocytes Relative 12 %   Monocytes Absolute 1.3 (H) 0.1 - 1.0 K/uL   Eosinophils Relative 2 %   Eosinophils Absolute 0.2 0.0 - 0.5 K/uL   Basophils Relative 1 %  Basophils Absolute 0.1 0.0 - 0.1 K/uL   Immature Granulocytes 0 %   Abs Immature Granulocytes 0.02 0.00 - 0.07 K/uL    Comment: Performed at [facility], [place] [address], [place], [place] [address]  Troponin I - ONCE - STAT     Status: None   Collection Time: 06/20/18 12:07 AM  Result Value Ref Range   Troponin I <0.03 <0.03 ng/mL    Comment: Performed at [place] [address] [place], [place] [address]  Magnesium     Status: None   Collection Time: 06/20/18 12:07 AM  Result Value Ref Range   Magnesium 2.1 1.7 - 2.4 mg/dL    Comment: Performed at [facility], [place] [address], [place], [place] [address]  SARS Coronavirus 2 (CEPHEID - Performed in [facility] hospital lab), Hosp Order     Status: None   Collection Time: 06/20/18 12:17 AM  Result Value Ref Range   SARS Coronavirus 2 NEGATIVE NEGATIVE    Comment: (NOTE) If result is NEGATIVE SARS-CoV-2 target nucleic acids are NOT DETECTED. The SARS-CoV-2 RNA is generally detectable in upper and lower  respiratory specimens during the acute phase of infection. The lowest  concentration of SARS-CoV-2 viral copies this assay can detect is 250  copies / mL. A negative result does not preclude SARS-CoV-2 infection  and should not be used as the sole basis for treatment or other  patient management decisions.  A negative result may occur with  improper specimen collection / handling, submission of specimen other  than nasopharyngeal swab, presence of viral mutation(s) within the  areas targeted by this assay, and inadequate number of viral copies  (<250 copies / mL). A negative result  must be combined with clinical  observations, patient history, and epidemiological information. If result is POSITIVE SARS-CoV-2 target nucleic acids are DETECTED. The SARS-CoV-2 RNA is generally detectable in upper and lower  respiratory specimens dur ing the acute phase of infection.  Positive  results are indicative of active infection with SARS-CoV-2.  Clinical  correlation with patient history and other diagnostic information is  necessary to determine patient infection status.  Positive results do  not rule out bacterial infection or co-infection with other viruses. If result is PRESUMPTIVE POSTIVE SARS-CoV-2 nucleic acids MAY BE PRESENT.   A presumptive positive result was obtained on the submitted specimen  and confirmed on repeat testing.  While 2019 novel coronavirus  (SARS-CoV-2) nucleic acids may be present in the submitted sample  additional confirmatory testing may be necessary for epidemiological  and / or clinical management purposes  to differentiate between  SARS-CoV-2 and other Sarbecovirus currently known to infect humans.  If clinically indicated additional testing with an alternate test  methodology [phone]) is advised. The SARS-CoV-2 RNA is generally  detectable in upper and lower respiratory sp ecimens during the acute  phase of infection. The expected result is Negative. Fact Sheet for Patients:  StrictlyIdeas.no Fact Sheet for Healthcare Providers: BankingDealers.co.za This test is not yet approved or cleared by the [place] FDA and has been authorized for detection and/or diagnosis of SARS-CoV-2 by FDA under an Emergency Use Authorization (EUA).  This EUA will remain in effect (meaning this test can be used) for the duration of the COVID-19 declaration under Section 564(b)(1) of the Act, 21 U.S.C. section 360bbb-3(b)(1), unless the authorization is terminated or revoked sooner. Performed at [facility], [place] [address], [place], [place] [address]  Dg Chest Port 1 View  Result Date: 06/20/2018 CLINICAL DATA:  Shortness of breath EXAM: PORTABLE CHEST 1 VIEW COMPARISON:  12/12/2015 FINDINGS: The heart size and mediastinal contours are within normal limits. Both lungs are clear. The visualized skeletal structures are unremarkable. IMPRESSION: No active disease. Electronically Signed   By: Ulyses Jarred M.D.   On: 06/20/2018 00:36    Pending Labs Unresulted Labs (From admission, onward)    Start     Ordered   06/20/18 0500  TSH  Tomorrow morning,   R     06/20/18 0159   06/20/18 0200  Troponin I - Now Then Q6H  Now then every 6 hours,   R     06/20/18 0159          Vitals/Pain Today's Vitals   06/20/18 0100 06/20/18 0115 06/20/18 0130 06/20/18 0133  BP: (!) 136/113 124/78 113/76   Pulse: (!) 108 88 92   Resp: 20 16 (!) 23   Temp:    98.2 F (36.8 C)  TempSrc:    Oral  SpO2: 98% 96% 95%   Weight:      Height:      PainSc:        Isolation Precautions No active isolations  Medications Medications  diltiazem (CARDIZEM) 1 mg/mL load via infusion 20 mg (20 mg Intravenous Bolus from Bag 06/20/18 0111)    And  diltiazem (CARDIZEM) 100 mg in dextrose 5% 161mL (1 mg/mL) infusion (5 mg/hr Intravenous New Bag/Given 06/20/18 0110)    Mobility walks Low fall risk   Focused Assessments Cardiac Assessment Handoff:  Cardiac Rhythm: Atrial fibrillation Lab Results  Component Value Date   CKTOTAL 73 10/08/2011   CKMB 1.9 10/08/2011   TROPONINI <0.03 06/20/2018   No results found for: DDIMER Does the Patient currently have chest pain? No     R Recommendations: See Admitting Provider Note  Report given to:   Additional Notes:

## 2018-06-20 NOTE — H&P (Signed)
History and Physical    Peter Barnett GHW:[id number] DOB: 04/11/1960 DOA: 06/19/2018  PCP: [name], MD Patient coming from: Home lives at home with his wife Chief Complaint: Shortness of breath  HPI: Peter Barnett is a 58 y.o. 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  He denied any chest pain.  Denied any nausea vomiting diarrhea abdominal pain fever or chills.  Denied any sick contacts.  Denies any travel.  Denies any abdominal pain urinary complaints.  He was seen by Dr. 1 nausea approximately 3 years ago but has not followed up with him since then.  His care has been shifted to [facility].  Cath in 2017 showed 2 vessel obstructive CAD with 99% distal RCA and 95% first diagonal.  Received a DES to first diagonal he was discharged on aspirin and Brilinta for 1 year.   This morning he denies any chest pain chest pressure or shortness of breath or palpitations.  He admits to drinking 1 beer per day.  He denies smoking.  Denies use of illicit drugs.  ED Course: Patient was started on Cardizem drip for A. fib with RVR  Review of Systems: As per HPI otherwise all other systems reviewed and are negative  Ambulatory Status: Ambulatory at baseline.  Past Medical History:  Diagnosis Date   CAD (coronary artery disease)    a. MI/DES to Cx 2009 with staged PCI of LAD with DES and distal RCA with BMS. b. NSTEMI 12/2015 s/p DES to D1, known occlusion of dRCA tx medically.   Chronic lower back pain    Still with recurrent left sided LBP with paresthesias down left leg --primarily with activities: pain pill use is avg <90 tabs per mo   COPD (chronic obstructive pulmonary disease) ([place])    NOTED on CT chest 11/2013 done for lung ca screening.  Hosp for acute exac  11/2014.  Spirometry not supportive of this dx as of pulm eval 2017, though.  Spireva trial by Dr. Elsworth Soho 02/2015.   GERD (gastroesophageal reflux disease)    Heart attack ([place]) 12/12/2015   History of adenomatous polyp of colon 02/2009;02/2014   Recall 5 yrs ([facility])   History of kidney stones    HTN (hypertension)    Hyperlipidemia    Intracranial hemorrhage ([place]) 12/27/14   small acute hemorrhage in gliotic medial R frontal lobe   Memory loss    MI (myocardial infarction) ([place]) 2009   "just had arm pain; never any chest pain"   Migraine syndrome 10/07/2011    a couple per month usually, but worsening 2017 so Dr. 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  Complex partial sz d/o.   Short-term memory loss 10/07/2011   "post brain OR & from his seizures"--worsening 2017, Dr. Krista Blue in neurology doing w/u.   Stroke [facility]) 12/2014   Tobacco abuse    Urethral stricture since 1981   Recurrent dilation has been required [facility] urology)    Past Surgical History:  Procedure Laterality Date   BACK SURGERY  11/25/2015   CARDIAC CATHETERIZATION  2011   [place], records unavailable: pt reports "normal".   CARDIAC CATHETERIZATION N/A 12/15/2015   Procedure: Left Heart Cath and Coronary Angiography;  Surgeon: Peter M Martinique, MD;  Location: [place] [facility];  Service: Cardiovascular;  Laterality: N/A;   CARDIAC CATHETERIZATION N/A 12/15/2015   Procedure: Coronary Stent Intervention;  Surgeon: Peter M Martinique, MD;  Location: [place] [facility];  Service: Cardiovascular;  Laterality: N/A;   COLONOSCOPY W/ POLYPECTOMY  02/2009;02/2014   Recall 5 yr (aden poly, hyperplast polyp, int and ext hemorr).  Next recall is 02/2019.   CORONARY ANGIOPLASTY WITH STENT PLACEMENT  2009   3 DES to LCx; still with known distal RCA/PDA occlusion.  EF 60% by LV-gram.   CRANIOTOMY FOR AVM  1981   Dx'd after pt began  having seizures.   CT ANGIOGRAM (CEREBRAL)  12/31/14   NORMAL--plan per neuro is to repeat this in 3 mo.  Pt states it was repeated Spring 2017 and was "fine"   [place]  2008; 2009   Dr. Joya Salm   LUMBAR LAMINECTOMY/DECOMPRESSION MICRODISCECTOMY N/A 11/25/2015   Procedure: Lumbar three- four Lumbar four- five Laminectomy/Foraminotomy;  Surgeon: Leeroy Cha, MD;  Location: [place];  Service: Neurosurgery;  Laterality: N/A;  L3-4 L4-5 Laminectomy/Foraminotomy/possible Lumbar Drain   TONSILLECTOMY     "I was little"   TRANSTHORACIC ECHOCARDIOGRAM  12/07/14   Normal LV size/fxn, EF 60%, normal valves.    Social History   Socioeconomic History   Marital status: Married    Spouse name: [name]   Number of children: 2   Years of education: 10 th   Highest education level: Not on file  Occupational History   Occupation: retired     Fish farm manager: RETIRED    Comment: retired  Scientist, product/process development strain: Not on Training and development officer insecurity:    Worry: Not on file    Inability: Not on Lexicographer needs:    Medical: Not on file    Non-medical: Not on file  Tobacco Use   Smoking status: Former Smoker    Packs/day: 3.00    Years: 27.00    Pack years: 81.00    Types: Cigarettes    Last attempt to quit: 02/08/2001    Years since quitting: 17.3   Smokeless tobacco: Never Used  Substance and Sexual Activity   Alcohol use: Yes    Alcohol/week: 7.0 standard drinks    Types: 7 Cans of beer per week    Comment: 12 oz beer    Drug use: No   Sexual activity: Yes  Lifestyle   Physical activity:    Days per week: Not on file    Minutes per session: Not on file   Stress: Not on file  Relationships   Social connections:    Talks on phone: Not on file    Gets together: Not on file    Attends religious service: Not on file    Active member of club or organization: Not on file    Attends meetings of clubs or organizations: Not on file     Relationship status: Not on file   Intimate partner violence:    Fear of current or ex partner: Not on file    Emotionally abused: Not on file    Physically abused: Not on file    Forced sexual activity: Not on file  Other Topics Concern   Not on file  Social History Narrative   Patient lives at home with his wife [name]).   Two adult children.   Retired from [employer] approx age 50 due to medical reasons (disabled due to memory issues, seizure d/o and  chronic LBP).   Education 10th grade.   Right handed.   Caffeine two cups of tea and two cups of coffee.   Tobacco 45 pack-yr hx, quit approx age 27.  Alcohol: 1-2 beers per night.     No hx of alc or drug problems.   No exercise.   Diet: regular    Allergies  Allergen Reactions   Tramadol Other (See Comments)    SEIZURES "Staring off spells"   Atorvastatin Other (See Comments)    MYALGIAS    Family History  Problem Relation Age of Onset   Hyperlipidemia Mother    Cancer Father        Mesothelioma   Hypertension Father    Asthma Father    Heart attack Paternal Uncle        In 78s too   Stroke Neg Hx       Prior to Admission medications   Medication Sig Start Date End Date Taking? Authorizing Provider  acetaminophen (TYLENOL) 500 MG tablet Take 1 tablet (500 mg total) by mouth every 6 (six) hours as needed for headache. 12/16/15  Yes Cheryln Manly, NP  albuterol (PROVENTIL HFA;VENTOLIN HFA) 108 (90 BASE) MCG/ACT inhaler Inhale 2 puffs into the lungs every 6 (six) hours as needed for wheezing or shortness of breath. 12/08/14  Yes Reyne Dumas, MD  aspirin EC 81 MG tablet Take 81 mg by mouth daily.   Yes [provider]  butalbital-acetaminophen-caffeine (FIORICET, ESGIC) 50-325-40 MG tablet Take 1 tablet by mouth every 6 (six) hours as needed for headache. 06/17/15  Yes Marcial Pacas, MD  Cholecalciferol 1000 units tablet Take 1,000 Units by mouth daily.   Yes [provider]  clopidogrel  (PLAVIX) 75 MG tablet TAKE 1 TABLET BY MOUTH EVERY DAY Patient taking differently: Take 75 mg by mouth daily.  09/15/17  Yes Dunn, Dayna N, PA-C  docusate sodium (COLACE) 100 MG capsule Take 200 mg by mouth daily.    Yes [provider]  famotidine (PEPCID) 20 MG tablet Take 20 mg by mouth daily.   Yes [provider]  HYDROcodone-acetaminophen (NORCO) 10-325 MG per tablet Take 1 tablet by mouth every 6 (six) hours as needed (pain).  08/07/14  Yes [provider]  isosorbide mononitrate (IMDUR) 30 MG 24 hr tablet Take 1 tablet (30 mg total) by mouth daily. Please make overdue appt with Dr. Irish Lack for future refills. 2nd attempt. 07/28/17  Yes Jettie Booze, MD  metoprolol tartrate (LOPRESSOR) 25 MG tablet TAKE 1/2 TABLET BY MOUTH TWICE DAILY Patient taking differently: Take 12.5 mg by mouth 2 (two) times daily.  02/03/16  Yes McGowen, Adrian Blackwater, MD  nitroGLYCERIN (NITROSTAT) 0.4 MG SL tablet Place 1 tablet (0.4 mg total) under the tongue every 5 (five) minutes as needed for chest pain (CP or SOB). 12/16/15 02/12/19 Yes Jettie Booze, MD  oxcarbazepine (TRILEPTAL) 600 MG tablet TAKE ONE TABLET BY MOUTH TWICE DAILY Patient taking differently: Take 600 mg by mouth 2 (two) times daily.  06/23/16  Yes Marcial Pacas, MD  rosuvastatin (CRESTOR) 40 MG tablet Take 1 tablet (40 mg total) by mouth daily. 07/25/17  Yes Jettie Booze, MD  vitamin B-12 (CYANOCOBALAMIN) 1000 MCG tablet Take 1,000 mcg by mouth daily.   Yes [provider]  clopidogrel (PLAVIX) 75 MG tablet Take 1 tablet (75 mg total) by mouth daily. Please make overdue appt with Dr. Irish Lack before anymore refills. 2nd attempt Patient not taking: Reported on 06/20/2018 06/20/17  Charlie Pitter, PA-C    Physical Exam: Patient resting in bed asking for a cup of coffee denies chest pressure palpitation or shortness of breath Vitals:   06/20/18 0308 06/20/18 0314 06/20/18 0316 06/20/18 0544  BP:   113/82  (!) 147/85  Pulse:  85  85  Resp:   20   Temp:  (!) 97.5 F (36.4 C)  97.6 F (36.4 C)  TempSrc:  Oral  Oral  SpO2:  96%  96%  Weight: 110.6 kg     Height:          General:  Appears calm and comfortable  Eyes:  PERRL, EOMI, normal lids, iris  ENT:  grossly normal hearing, lips & tongue, mmm  Neck: no LAD, masses or thyromegaly  Cardiovascular: Irregular rate and rhythm, no m/r/g. No LE edema.   Respiratory:  CTA bilaterally, no w/r/r. Normal respiratory effort.  Abdomen:  soft, ntnd, NABS  Skin: no rash or induration seen on limited exam  Musculoskeletal:  grossly normal tone BUE/BLE, good ROM, no bony abnormality  Psychiatric:  grossly normal mood and affect, speech fluent and appropriate, AOx3  Neurologic: CN 2-12 grossly intact, moves all extremities in coordinated fashion, sensation intact  Labs on Admission: I have personally reviewed following labs and imaging studies  CBC: Recent Labs  Lab 06/20/18 0007  WBC 10.4  NEUTROABS 7.4  HGB 17.7*  HCT 52.4*  MCV 86.6  PLT 671   Basic Metabolic Panel: Recent Labs  Lab 06/20/18 0007  NA 141  K 3.8  CL 103  CO2 22  GLUCOSE 114*  BUN 10  CREATININE 1.04  CALCIUM 9.7  MG 2.1   GFR: Estimated Creatinine Clearance: 97.9 mL/min (by C-G formula based on SCr of 1.04 mg/dL). Liver Function Tests: No results for input(s): AST, ALT, ALKPHOS, BILITOT, PROT, ALBUMIN in the last 168 hours. No results for input(s): LIPASE, AMYLASE in the last 168 hours. No results for input(s): AMMONIA in the last 168 hours. Coagulation Profile: No results for input(s): INR, PROTIME in the last 168 hours. Cardiac Enzymes: Recent Labs  Lab 06/20/18 0007 06/20/18 0212  TROPONINI <0.03 <0.03   BNP (last 3 results) No results for input(s): PROBNP in the last 8760 hours. HbA1C: No results for input(s): HGBA1C in the last 72 hours. CBG: No results for input(s): GLUCAP in the last 168 hours. Lipid Profile: No results for  input(s): CHOL, HDL, LDLCALC, TRIG, CHOLHDL, LDLDIRECT in the last 72 hours. Thyroid Function Tests: Recent Labs    06/20/18 0212  TSH 1.332   Anemia Panel: No results for input(s): VITAMINB12, FOLATE, FERRITIN, TIBC, IRON, RETICCTPCT in the last 72 hours. Urine analysis:    Component Value Date/Time   COLORURINE RED (A) 04/28/2013 2105   APPEARANCEUR TURBID (A) 04/28/2013 2105   LABSPEC 1.019 04/28/2013 2105   PHURINE 7.0 04/28/2013 2105   GLUCOSEU NEGATIVE 04/28/2013 2105   HGBUR LARGE (A) 04/28/2013 2105   BILIRUBINUR neg 05/12/2013 1123   KETONESUR 40 (A) 04/28/2013 2105   PROTEINUR 100 05/12/2013 1123   PROTEINUR 100 (A) 04/28/2013 2105   UROBILINOGEN 0.2 05/12/2013 1123   UROBILINOGEN 1.0 04/28/2013 2105   NITRITE positive 05/12/2013 1123   NITRITE POSITIVE (A) 04/28/2013 2105   LEUKOCYTESUR moderate (2+) 05/12/2013 1123    Creatinine Clearance: Estimated Creatinine Clearance: 97.9 mL/min (by C-G formula based on SCr of 1.04 mg/dL).  Sepsis Labs: @LABRCNTIP (procalcitonin:4,lacticidven:4) ) Recent Results (from the past 240 hour(s))  SARS Coronavirus 2 (CEPHEID - Performed  in [place] lab), Hosp Order     Status: None   Collection Time: 06/20/18 12:17 AM  Result Value Ref Range Status   SARS Coronavirus 2 NEGATIVE NEGATIVE Final    Comment: (NOTE) If result is NEGATIVE SARS-CoV-2 target nucleic acids are NOT DETECTED. The SARS-CoV-2 RNA is generally detectable in upper and lower  respiratory specimens during the acute phase of infection. The lowest  concentration of SARS-CoV-2 viral copies this assay can detect is 250  copies / mL. A negative result does not preclude SARS-CoV-2 infection  and should not be used as the sole basis for treatment or other  patient management decisions.  A negative result may occur with  improper specimen collection / handling, submission of specimen other  than nasopharyngeal swab, presence of viral mutation(s) within  the  areas targeted by this assay, and inadequate number of viral copies  (<250 copies / mL). A negative result must be combined with clinical  observations, patient history, and epidemiological information. If result is POSITIVE SARS-CoV-2 target nucleic acids are DETECTED. The SARS-CoV-2 RNA is generally detectable in upper and lower  respiratory specimens dur ing the acute phase of infection.  Positive  results are indicative of active infection with SARS-CoV-2.  Clinical  correlation with patient history and other diagnostic information is  necessary to determine patient infection status.  Positive results do  not rule out bacterial infection or co-infection with other viruses. If result is PRESUMPTIVE POSTIVE SARS-CoV-2 nucleic acids MAY BE PRESENT.   A presumptive positive result was obtained on the submitted specimen  and confirmed on repeat testing.  While 2019 novel coronavirus  (SARS-CoV-2) nucleic acids may be present in the submitted sample  additional confirmatory testing may be necessary for epidemiological  and / or clinical management purposes  to differentiate between  SARS-CoV-2 and other Sarbecovirus currently known to infect humans.  If clinically indicated additional testing with an alternate test  methodology [phone]) is advised. The SARS-CoV-2 RNA is generally  detectable in upper and lower respiratory sp ecimens during the acute  phase of infection. The expected result is Negative. Fact Sheet for Patients:  StrictlyIdeas.no Fact Sheet for Healthcare Providers: BankingDealers.co.za This test is not yet approved or cleared by the [place] FDA and has been authorized for detection and/or diagnosis of SARS-CoV-2 by FDA under an Emergency Use Authorization (EUA).  This EUA will remain in effect (meaning this test can be used) for the duration of the COVID-19 declaration under Section 564(b)(1) of the Act, 21  U.S.C. section 360bbb-3(b)(1), unless the authorization is terminated or revoked sooner. Performed at [facility], [place] [address], [place], [place] [address]      Radiological Exams on Admission: Dg Chest Port 1 View  Result Date: 06/20/2018 CLINICAL DATA:  Shortness of breath EXAM: PORTABLE CHEST 1 VIEW COMPARISON:  12/12/2015 FINDINGS: The heart size and mediastinal contours are within normal limits. Both lungs are clear. The visualized skeletal structures are unremarkable. IMPRESSION: No active disease. Electronically Signed   By: Ulyses Jarred M.D.   On: 06/20/2018 00:36    EKG: A. fib Assessment/Plan Active Problems:   Atrial fibrillation with RVR ([place])    #1 new onset atrial fibrillation in the setting of CAD hypertension-patient admitted with shortness of breath.  I will also start him on heparin drip.  Currently he is rate controlled on Cardizem drip.  Potassium 3.8, troponin less than 0.03, mag 2.1, TSH 1.33 COVID negative chest x-ray clear check  echo troponin and consult cardiology.  Consult case management to get the pricing on outpatient anticoagulation.  #2 history of stroke and CAD patient on aspirin and Plavix at home.  #3 hypertension he takes Imdur 30 mg daily with Lopressor 25 mg twice a day.  #4 seizure disorder with history of intracranial hemorrhage continue Trileptal    Estimated body mass index is 34.02 kg/m as calculated from the following:   Height as of this encounter: 5\' 11"  (1.803 m).   Weight as of this encounter: 110.6 kg.   DVT prophylaxis: IV heparin Code Status: Full code Family Communication: Patient lives with his wife at home he told me he will update the wife and not to call his wife  disposition Plan: Pending cardiac work-up Consults called cardiology  admission status: Observation   Georgette Shell MD [facility]  If 7PM-7AM, please contact night-coverage [email or web address] Password [facility]  06/20/2018, 8:07 AM

## 2018-06-20 NOTE — Care Management (Signed)
This is a no charge note  Pending admission per Dr. Christy Gentles  58 year old man with past medical history of hypertension, hyperlipidemia, COPD, stroke, GERD, CAD, DES placement, intracranial hemorrhage, seizure, who presents with shortness of breath, no chest pain per ED physician.  Found to have new onset atrial fibrillation with RVR, heart rate up to 130.  Cardizem drip was started.  Currently heart rate is at 90s.   Patient was found to have negative troponin, WBC 10.4, potassium 3.8, creatinine 1.04, BUN 10, temperature normal, tachypnea, oxygen satting 95%, negative chest x-ray, blood pressure 113/76. COVID-19 test negative.  Patient is placed on progressive bed for observation.   Ivor Costa, MD  [facility]   If 7PM-7AM, please contact night-coverage [email or web address] Password [email or web address] 06/20/2018, 3:52 AM

## 2018-06-20 NOTE — TOC Benefit Eligibility Note (Signed)
Transition of Care [facility]) Benefit Eligibility Note    Patient Details  Name: Peter Barnett MRN: [id number] Date of Birth: 1960-09-15   Medication/Dose: Alveda Reasons 20 MG DAILY  Covered?: Yes  Tier: 3 Drug  Prescription Coverage Preferred Pharmacy: YES([facility])  Spoke with Person/Company/Phone Number:: [employer] # [phone])  Co-Pay: $ 47.00  Prior Approval: No  Deductible: Met  Additional Notes: ELIQUIS   5 MG BID(COVER- YES, CO-PAY- $ 47.00, TIER- 3 DRUG, P/A-NO)    Memory Argue Phone Number: 06/20/2018, 2:37 PM

## 2018-06-20 NOTE — Progress Notes (Signed)
ANTICOAGULATION CONSULT NOTE - Initial Consult  Pharmacy Consult for Heparin Indication: atrial fibrillation  Allergies  Allergen Reactions  . Tramadol Other (See Comments)    SEIZURES "Staring off spells"  . Atorvastatin Other (See Comments)    MYALGIAS    Patient Measurements: Height: 5\' 11"  (180.3 cm) Weight: 243 lb 14.4 oz (110.6 kg) IBW/kg (Calculated) : 75.3 Heparin Dosing Weight: 99 kg  Vital Signs: Temp: 97.6 F (36.4 C) (05/12 0544) Temp Source: Oral (05/12 0544) BP: 147/85 (05/12 0544) Pulse Rate: 85 (05/12 0544)  Labs: Recent Labs    06/20/18 0007 06/20/18 0212  HGB 17.7*  --   HCT 52.4*  --   PLT 207  --   CREATININE 1.04  --   TROPONINI <0.03 <0.03    Estimated Creatinine Clearance: 97.9 mL/min (by C-G formula based on SCr of 1.04 mg/dL).   Medical History: Past Medical History:  Diagnosis Date  . CAD (coronary artery disease)    a. MI/DES to Cx 2009 with staged PCI of LAD with DES and distal RCA with BMS. b. NSTEMI 12/2015 s/p DES to D1, known occlusion of dRCA tx medically.  . Chronic lower back pain    Still with recurrent left sided LBP with paresthesias down left leg --primarily with activities: pain pill use is avg <90 tabs per mo  . COPD (chronic obstructive pulmonary disease) ([place])    NOTED on CT chest 11/2013 done for lung ca screening.  Hosp for acute exac 11/2014.  Spirometry not supportive of this dx as of pulm eval 2017, though.  Spireva trial by Dr. Elsworth Soho 02/2015.  Marland Kitchen GERD (gastroesophageal reflux disease)   . Heart attack ([place]) 12/12/2015  . History of adenomatous polyp of colon 02/2009;02/2014   Recall 5 yrs ([facility])  . History of kidney stones   . HTN (hypertension)   . Hyperlipidemia   . Intracranial hemorrhage ([place]) 12/27/14   small acute hemorrhage in gliotic medial R frontal lobe  . Memory loss   . MI (myocardial infarction) ([place]) 2009   "just had arm pain; never any chest pain"  . Migraine syndrome  10/07/2011    a couple per month usually, but worsening 2017 so Dr. Krista Blue adjusted med  . Petit-mal epilepsy ([place]) 1981-present  . Seizures ([place])    last one was fall of 2016.  Complex partial sz d/o.  Marland Kitchen Short-term memory loss 10/07/2011   "post brain OR & from his seizures"--worsening 2017, Dr. Krista Blue in neurology doing w/u.  Marland Kitchen Stroke ([place]) 12/2014  . Tobacco abuse   . Urethral stricture since 1981   Recurrent dilation has been required [facility] urology)    Assessment: CC/HPI: SOB, Afib  Anticoag: Baseline CBC WNL. New onset afib. PMH of ICH in 2016 noted.  Goal of Therapy:  Heparin level 0.3-0.7 units/ml Monitor platelets by anticoagulation protocol: Yes   Plan:  Start IV heparin (no bolus) 1350 units/hr Check HL in 6 hrs Daily HL and CBC F/u to resume home meds.  J[place]ica Inthavong S. Alford Highland, PharmD, BCPS Clinical Staff Pharmacist Eilene Ghazi Stillinger 06/20/2018,9:01 AM

## 2018-06-20 NOTE — Consult Note (Signed)
Cardiology Consultation:   Patient ID: Peter Barnett; [id number]; 1960-12-09   Admit date: 06/19/2018 Date of Consult: 06/20/2018  Primary Care Provider: [name], MD Primary Cardiologist: Previously Peter Barnett; more recently [facility] Card[facility], Peter Parents, MD Primary Electrophysiologist:  None   Patient Profile:   Peter Barnett is a 58 y.o. male with a PMH of CAD s/p PCI to LAD, Cx, and 1st diagonal (last intervention 2015/05/09), HTN, HLD, COPD, right frontal AV malformation s/p craniotomy c/b recurrent bleed in 2014-05-09, and seizure diorder who is being seen today for the evaluation of atrial fibrillation at the request of Peter Barnett.  History of Present Illness:   Peter Barnett presented to the ED 06/20/2018 with complaints of SOB. He reported waking up around 3am 06/19/2018 with SOB. He used his inhaler and reported improvement in symptoms. He continued to have intermittent SOB throughout the day which improved with his inhaler, however in the evening he reported onset of a fluttering in his chest. He used his wife's cardia monitor on her cell phone which reported that he was in atrial fibrillation. Given he has never had Afib in the past, he presented to the ED for further evaluation.   He previously followed by Peter Barnett, however most recently has been seen at [facility] with last visit 02/2018 at which time continued to complain of DOE despite benign work-up including stress test, PFTs, and echo. Differential for DOE was narrowed down to possible sleep apnea (patient refusing sleep study) vs BBlocker, therefore his lopressor was discontinued. He was noted to have poorly controlled lipids (LDL 97, HDL 47) on crestor 40mg  daily, however is unable to afford Repatha, therefore zetia was started. His last ischemic evaluation was a NST 11/2017 which was without ischemia. His last cardiac catheterization was 12/2015 with stent placement to 1st diagonal and previously  placed pLAD and mLCx stents patent; residual dRCA disease was medically managed. Last echo in [place] was in May 09, 2014 and showed EF 55-60%, normal wall motion, and otherwise normal.   At the time of this evaluation he reports significant improvement in his SOB and palpitations. He does not recall having palpitations in the early morning hours of 06/19/2018 when his SOB started, but states they began around 10pm 06/19/2018. He denies any chest pain, left arm pain, or diarrhea which are the symptoms he experienced with prior MI's. He reports having chronic DOE but had never experiencesdpalpitations with those episodes.  Hospital course: tachycardic on arrival, BP overall stable, otherwise VSS. Labs notable for electrolytes wnl, Cr 1.04, Hgb 17.7, PLT 207, WBC 10.4, Trop negative x3, TSH wnl, COVID-19 negative. CXR without acute findings. Initial EKG with atrial fibrillation, rate 130; chronic LAFB and T-wave abnormalities in lateral leads (more pronounced than previous in May 09, 2015). He was started on a diltiazem gtt with improvement in rate. Heparin gtt initiated. Patient was admitted to medicine. Cardiology asked to evaluate for new onset atrial fibrillation.   Past Medical History:  Diagnosis Date   CAD (coronary artery disease)    a. MI/DES to Cx 05/09/2007 with staged PCI of LAD with DES and distal RCA with BMS. b. NSTEMI 12/2015 s/p DES to D1, known occlusion of dRCA tx medically.   Chronic lower back pain    Still with recurrent left sided LBP with paresthesias down left leg --primarily with activities: pain pill use is avg <90 tabs per mo   COPD (chronic obstructive pulmonary disease) ([place])    NOTED on CT  chest 11/2013 done for lung ca screening.  Hosp for acute exac 11/2014.  Spirometry not supportive of this dx as of pulm eval 2017, though.  Spireva trial by Peter Barnett 02/2015.   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  Complex partial sz d/o.   Short-term memory loss 10/07/2011   "post brain OR & from his seizures"--worsening 2017, Peter Barnett in neurology doing w/u.   Stroke [facility]) 12/2014   Tobacco abuse    Urethral stricture since 1981   Recurrent dilation has been required [facility] urology)    Past Surgical History:  Procedure Laterality Date   BACK SURGERY  11/25/2015   CARDIAC CATHETERIZATION  2011   [place], records unavailable: pt reports "normal".   CARDIAC CATHETERIZATION N/A 12/15/2015   Procedure: Left Heart Cath and Coronary Angiography;  Surgeon: Peter M Martinique, MD;  Location: [facility];  Service: Cardiovascular;  Laterality: N/A;   CARDIAC CATHETERIZATION N/A 12/15/2015   Procedure: Coronary Stent Intervention;  Surgeon: Peter M Martinique, MD;  Location: [facility] CV LAB;  Service: Cardiovascular;  Laterality: N/A;   COLONOSCOPY W/ POLYPECTOMY  02/2009;02/2014   Recall 5 yr (aden poly, hyperplast polyp, int and ext hemorr).  Next recall is 02/2019.   CORONARY ANGIOPLASTY WITH STENT PLACEMENT  2009   3 DES to LCx; still with known distal RCA/PDA occlusion.  EF 60% by LV-gram.   CRANIOTOMY FOR AVM  1981   Dx'd after pt began having seizures.   CT ANGIOGRAM (CEREBRAL)  12/31/14   NORMAL--plan per neuro is to repeat this in 3 mo.  Pt states it was repeated Spring 2017 and was "fine"   [place]  2008; 2009   Peter Barnett   LUMBAR LAMINECTOMY/DECOMPRESSION  MICRODISCECTOMY N/A 11/25/2015   Procedure: Lumbar three- four Lumbar four- five Laminectomy/Foraminotomy;  Surgeon: Leeroy Cha, MD;  Location: [facility];  Service: Neurosurgery;  Laterality: N/A;  L3-4 L4-5 Laminectomy/Foraminotomy/possible Lumbar Drain   TONSILLECTOMY     "I was little"   TRANSTHORACIC ECHOCARDIOGRAM  12/07/14   Normal LV size/fxn, EF 60%, normal valves.     Home Medications:  Prior to Admission medications   Medication Sig Start Date End Date Taking? Authorizing Provider  acetaminophen (TYLENOL) 500 MG tablet Take 1 tablet (500 mg total) by mouth every 6 (six) hours as needed for headache. 12/16/15  Yes Cheryln Manly, NP  albuterol (PROVENTIL HFA;VENTOLIN HFA) 108 (90 BASE) MCG/ACT inhaler Inhale 2 puffs into the lungs every 6 (six) hours as needed for wheezing or shortness of breath. 12/08/14  Yes Reyne Dumas, MD  aspirin EC 81 MG tablet Take 81 mg by mouth daily.   Yes [provider]  butalbital-acetaminophen-caffeine (FIORICET, ESGIC) 50-325-40 MG tablet Take 1 tablet by mouth every 6 (six) hours as needed for headache. 06/17/15  Yes Marcial Pacas, MD  Cholecalciferol 1000 units tablet Take 1,000 Units by mouth daily.   Yes [provider]  clopidogrel (PLAVIX) 75 MG tablet TAKE 1 TABLET BY MOUTH EVERY DAY Patient taking differently: Take 75 mg by mouth daily.  09/15/17  Yes Dunn, Dayna N, PA-C  docusate sodium (COLACE) 100 MG capsule Take 200 mg by mouth daily.    Yes [provider]  famotidine (PEPCID) 20 MG tablet Take 20 mg by mouth daily.   Yes [provider]  HYDROcodone-acetaminophen (NORCO) 10-325 MG per tablet Take 1 tablet by mouth every 6 (six) hours as needed (pain).  08/07/14  Yes [provider]  isosorbide mononitrate (IMDUR) 30 MG 24 hr tablet Take 1 tablet (30 mg total) by mouth daily. Please make overdue appt with Peter Barnett for future refills. 2nd attempt. 07/28/17  Yes Jettie Booze, MD    metoprolol tartrate (LOPRESSOR) 25 MG tablet TAKE 1/2 TABLET BY MOUTH TWICE DAILY Patient taking differently: Take 12.5 mg by mouth 2 (two) times daily.  02/03/16  Yes McGowen, Adrian Blackwater, MD  nitroGLYCERIN (NITROSTAT) 0.4 MG SL tablet Place 1 tablet (0.4 mg total) under the tongue every 5 (five) minutes as needed for chest pain (CP or SOB). 12/16/15 02/12/19 Yes Jettie Booze, MD  oxcarbazepine (TRILEPTAL) 600 MG tablet TAKE ONE TABLET BY MOUTH TWICE DAILY Patient taking differently: Take 600 mg by mouth 2 (two) times daily.  06/23/16  Yes Marcial Pacas, MD  rosuvastatin (CRESTOR) 40 MG tablet Take 1 tablet (40 mg total) by mouth daily. 07/25/17  Yes Jettie Booze, MD  vitamin B-12 (CYANOCOBALAMIN) 1000 MCG tablet Take 1,000 mcg by mouth daily.   Yes [provider]  clopidogrel (PLAVIX) 75 MG tablet Take 1 tablet (75 mg total) by mouth daily. Please make overdue appt with Peter Barnett before anymore refills. 2nd attempt Patient not taking: Reported on 06/20/2018 06/20/17   Charlie Pitter, PA-C    Inpatient Medications: Scheduled Meds:  Oxcarbazepine  600 mg Oral BID   Continuous Infusions:  diltiazem (CARDIZEM) infusion 5 mg/hr (06/20/18 0953)   heparin 1,350 Units/hr (06/20/18 0952)   PRN Meds: HYDROcodone-acetaminophen, LORazepam  Allergies:    Allergies  Allergen Reactions   Tramadol Other (See Comments)    SEIZURES "Staring off spells"   Atorvastatin Other (See Comments)    MYALGIAS    Social History:   Social History   Socioeconomic History   Marital status: Married    Spouse name: [name]   Number of children: 2   Years of education: 10 th   Highest education level: Not on file  Occupational History   Occupation: retired     Fish farm manager: RETIRED    Comment: retired  Scientist, product/process development strain: Not on file   Food insecurity:    Worry: Not on file    Inability: Not on Lexicographer needs:    Medical: Not on file     Non-medical: Not on file  Tobacco Use   Smoking status: Former Smoker    Packs/day: 3.00    Years: 27.00    Pack years: 81.00    Types: Cigarettes    Last attempt to quit: 02/08/2001    Years since quitting: 17.3   Smokeless tobacco: Never Used  Substance and Sexual Activity   Alcohol use: Yes    Alcohol/week: 7.0 standard drinks    Types: 7 Cans of beer per week    Comment:  12 oz beer    Drug use: No   Sexual activity: Yes  Lifestyle   Physical activity:    Days per week: Not on file    Minutes per session: Not on file   Stress: Not on file  Relationships   Social connections:    Talks on phone: Not on file    Gets together: Not on file    Attends religious service: Not on file    Active member of club or organization: Not on file    Attends meetings of clubs or organizations: Not on file    Relationship status: Not on file   Intimate partner violence:    Fear of current or ex partner: Not on file    Emotionally abused: Not on file    Physically abused: Not on file    Forced sexual activity: Not on file  Other Topics Concern   Not on file  Social History Narrative   Patient lives at home with his wife [name]).   Two adult children.   Retired from [employer] approx age 67 due to medical reasons (disabled due to memory issues, seizure d/o and chronic LBP).   Education 10th grade.   Right handed.   Caffeine two cups of tea and two cups of coffee.   Tobacco 45 pack-yr hx, quit approx age 70.  Alcohol: 1-2 beers per night.     No hx of alc or drug problems.   No exercise.   Diet: regular    Family History:    Family History  Problem Relation Age of Onset   Hyperlipidemia Mother    Cancer Father        Mesothelioma   Hypertension Father    Asthma Father    Heart attack Paternal Uncle        In 59s too   Stroke Neg Hx      ROS:  Please see the history of present illness.   All other ROS reviewed and negative.     Physical Exam/Data:   Vitals:    06/20/18 0308 06/20/18 0314 06/20/18 0316 06/20/18 0544  BP:   113/82 (!) 147/85  Pulse:  85  85  Resp:   20   Temp:  (!) 97.5 F (36.4 C)  97.6 F (36.4 C)  TempSrc:  Oral  Oral  SpO2:  96%  96%  Weight: 110.6 kg     Height:        Intake/Output Summary (Last 24 hours) at 06/20/2018 1005 Last data filed at 06/20/2018 0400 Gross per 24 hour  Intake 33.81 ml  Output --  Net 33.81 ml   Filed Weights   06/19/18 2345 06/20/18 0308  Weight: 112.5 kg 110.6 kg   Body mass index is 34.02 kg/m.   Physical exam per MD: General:  Well nourished, well developed, in no acute distress HEENT: sclera anicteric  Lymph: no adenopathy Neck: no JVD Endocrine:  No thryomegaly Vascular: No carotid bruits; distal pulses 2+ bilaterally Cardiac:  normal S1, S2; irregular; no murmur  Lungs:  clear to auscultation bilaterally, no wheezing, rhonchi or rales  Abd: NABS, soft, nontender, no hepatomegaly Ext: no edema Musculoskeletal:  No deformities, BUE and BLE strength normal and equal Skin: warm and dry  Neuro:  CNs 2-12 intact, no focal abnormalities noted Psych:  Normal affect   EKG:  The EKG was personally reviewed and demonstrates:  atrial fibrillation, rate 130; chronic LAFB and T-wave abnormalities in lateral leads (more pronounced than previous  in 2017). Telemetry:  Telemetry was personally reviewed and demonstrates:  AFib, intermittent RVR  Relevant CV Studies: Left heart catheterization 2017:   The left ventricular systolic function is normal.  LV end diastolic pressure is mildly elevated.  The left ventricular ejection fraction is 55-65% by visual estimate.  Prox RCA lesion, 30 %stenosed.  Mid RCA lesion, 60 %stenosed.  Mid RCA to Dist RCA lesion, 60 %stenosed.  Dist RCA lesion, 99 %stenosed.  Ost LAD lesion, 0 %stenosed.  Mid Cx lesion, 0 %stenosed.  Ost 2nd Mrg to 2nd Mrg lesion, 40 %stenosed.  A STENT SYNERGY DES 2.25X12 drug eluting stent was successfully  placed.  1st Diag lesion, 95 %stenosed.  Post intervention, there is a 0% residual stenosis.   1. 2 vessel obstructive CAD    - 95% first diagonal    - 99% distal RCA. This is just distal to prior stent and appears chronic with left to right collaterals. The vessel is very small in this area and involves with PLOM and PDA bifurcation 2. Continued patency of stents in the proximal LAD and mid LCx. 3. Normal LV function with mildly elevated LVEDP 4. Successful stenting of the first diagonal with DES.  Plan: DAPT for one year. Medical management for residual disease in RCA. If patient has refractory angina we could attempt repeat PCI of this lesion but long term patency would be poor due to vessel size and diffuse disease.   Echocardiogram 2016: Study Conclusions  - Left ventricle: The cavity size was normal. Systolic function was   normal. The estimated ejection fraction was in the range of 55%   to 60%. Wall motion was normal; there were no regional wall   motion abnormalities. - Aortic valve: Trileaflet; normal thickness, mildly calcified   leaflets. There was trivial regurgitation.  Laboratory Data:  Chemistry Recent Labs  Lab 06/20/18 0007  NA 141  K 3.8  CL 103  CO2 22  GLUCOSE 114*  BUN 10  CREATININE 1.04  CALCIUM 9.7  GFRNONAA >60  GFRAA >60  ANIONGAP 16*    No results for input(s): PROT, ALBUMIN, AST, ALT, ALKPHOS, BILITOT in the last 168 hours. Hematology Recent Labs  Lab 06/20/18 0007  WBC 10.4  RBC 6.05*  HGB 17.7*  HCT 52.4*  MCV 86.6  MCH 29.3  MCHC 33.8  RDW 12.7  PLT 207   Cardiac Enzymes Recent Labs  Lab 06/20/18 0007 06/20/18 0212 06/20/18 0749  TROPONINI <0.03 <0.03 <0.03   No results for input(s): TROPIPOC in the last 168 hours.  BNPNo results for input(s): BNP, PROBNP in the last 168 hours.  DDimer No results for input(s): DDIMER in the last 168 hours.  Radiology/Studies:  Dg Chest Port 1 View  Result Date:  06/20/2018 CLINICAL DATA:  Shortness of breath EXAM: PORTABLE CHEST 1 VIEW COMPARISON:  12/12/2015 FINDINGS: The heart size and mediastinal contours are within normal limits. Both lungs are clear. The visualized skeletal structures are unremarkable. IMPRESSION: No active disease. Electronically Signed   By: Ulyses Jarred M.D.   On: 06/20/2018 00:36    Assessment and Plan:   1. New onset atrial fibrillation with RVR: patient presented with SOB and palpitations. Cardia device used at home showed atrial fibrillation prompting him to present to the ED. EKG revealed afib with RVR, rate 130. He was started on a diltiazem gtt for rate control and a heparin gtt for stroke ppx. He has a history of of right frontal AVM s/p craniotomy in 1981  with intracranial hemorrhage in 2016. TSH wnl. Repeat echo pending read. No anginal complaints to suggest ACS. There is mention of suspected OSA on last cardiology note but patient refused sleep study.  - This patients CHA2DS2-VASc Score and unadjusted Ischemic Stroke Rate (% per year) is equal to 2.2 % stroke rate/year from a score of 2 Above score calculated as 1 point each if present [CHF, HTN, DM, Vascular=MI/PAD/Aortic Plaque, Age if 65-74, or Male] Above score calculated as 2 points each if present [Age > 75, or Stroke/TIA/TE] - Discussed his case with Dr. Lynnette Caffey, patients Neurologist with [facility], who states risk of stroke outweighs risk of bleeding at this point. No contraindication for anticoagulation at this time.  - Can start eliquis 5mg  BID for stroke ppx - Continue diltiazem gtt for rate control - transition to po - Possible TEE/DCCV this admission if patient does not convert back to NSR  2. CAD s/p multiple PCI/DES: last intervention with PCI/DES to 1st diag in 2017; patent LAD and Cx stents at that time. No anginal complaints - Continue plavix, statin, and zetia - Continue imdur - Stop aspirin at this time to minimize bleeding risks  3. HTN: BP  stable. Managed in the context of #1 - Continue to monitor  4. HLD: LDL 97 12/2017 despite crestor 40mg  daily. Zetia added at that time. Unable to afford repatha - Continue crestor and zetia  5. Seizure disorder: home meds not ordered - Continue management per primary team  6. Hx of AVM s/p craniotomy in 1980 with subsequent intracranial hemorrhage in 2016: Discussed with patients neurologist, Dr. Lynnette Caffey, with [facility] who agreed risk of stroke outweighed bleeding risk at this time and he is cleared to be on an anticoagulant.  - Patient will need clear instructions for warning signs of bleeding given history (severe HA's, vision changes, acute onset weakness, etc)     For questions or updates, please contact [place] Please consult [email or web address] for contact info under Cardiology/STEMI.   Signed, Abigail Butts, PA-C  06/20/2018 10:05 AM [phone]  I have examined the patient and reviewed assessment and plan and discussed with patient.  Agree with above as stated.    Symptomatic AFib.  Plan for TEE/CV in AM.  IV heparin .  On IV dilt.  WIll change to oral diltiazem 60 mg q 6 hours.  Change to Eliquis post cardioversion. Approved by his neurologist.  WOuld stop aspirin.  COuld continue Plavix with Eliquis for now.    Procedure , DCCV explained to patient.  He is in agreement.    Larae Grooms

## 2018-06-20 NOTE — Progress Notes (Signed)
[place] for Heparin - apixaban Indication: atrial fibrillation  Allergies  Allergen Reactions  . Tramadol Other (See Comments)    SEIZURES "Staring off spells"  . Atorvastatin Other (See Comments)    MYALGIAS    Patient Measurements: Height: 5\' 11"  (180.3 cm) Weight: 243 lb 14.4 oz (110.6 kg) IBW/kg (Calculated) : 75.3 Heparin Dosing Weight: 99 kg  Vital Signs: Temp: 97.6 F (36.4 C) (05/12 0544) Temp Source: Oral (05/12 0544) BP: 126/88 (05/12 1000) Pulse Rate: 85 (05/12 0544)  Labs: Recent Labs    06/20/18 0007 06/20/18 0212 06/20/18 0749 06/20/18 1318 06/20/18 1547  HGB 17.7*  --   --   --   --   HCT 52.4*  --   --   --   --   PLT 207  --   --   --   --   HEPARINUNFRC  --   --   --   --  0.11*  CREATININE 1.04  --   --   --   --   TROPONINI <0.03 <0.03 <0.03 <0.03  --     Estimated Creatinine Clearance: 97.9 mL/min (by C-G formula based on SCr of 1.04 mg/dL).   Assessment: CC/HPI: SOB, new Afib  Anticoag: Baseline CBC WNL. New onset afib. PMH of ICH in 2016 noted. Cleared by cardiology and neurology for apixaban as stroke risk > bleeding.  Plan:  Discontinue heparin drip Apixaban 5 mg PO bid Education prior to discharge   Renold Genta, PharmD, BCPS Clinical Pharmacist Clinical phone for 06/20/2018 until 8p is [phone] 06/20/2018 5:25 PM  **Pharmacist phone directory can now be found on [email or web address] listed under [place]**

## 2018-06-21 ENCOUNTER — Observation Stay (HOSPITAL_COMMUNITY): Payer: Medicare HMO | Admitting: Certified Registered Nurse Anesthetist

## 2018-06-21 ENCOUNTER — Encounter (HOSPITAL_COMMUNITY): Payer: Self-pay | Admitting: *Deleted

## 2018-06-21 ENCOUNTER — Observation Stay (HOSPITAL_BASED_OUTPATIENT_CLINIC_OR_DEPARTMENT_OTHER): Payer: Medicare HMO

## 2018-06-21 ENCOUNTER — Encounter (HOSPITAL_COMMUNITY)
Admission: EM | Disposition: A | Discharge status: Home / Self Care | Payer: Medicare HMO | Source: Home / Self Care | Attending: Emergency Medicine

## 2018-06-21 DIAGNOSIS — J449 Chronic obstructive pulmonary disease, unspecified: Secondary | ICD-10-CM | POA: Diagnosis not present

## 2018-06-21 DIAGNOSIS — I69811 Memory deficit following other cerebrovascular disease: Secondary | ICD-10-CM | POA: Diagnosis not present

## 2018-06-21 DIAGNOSIS — I619 Nontraumatic intracerebral hemorrhage, unspecified: Secondary | ICD-10-CM | POA: Diagnosis not present

## 2018-06-21 DIAGNOSIS — I25118 Atherosclerotic heart disease of native coronary artery with other forms of angina pectoris: Secondary | ICD-10-CM | POA: Diagnosis not present

## 2018-06-21 DIAGNOSIS — R569 Unspecified convulsions: Secondary | ICD-10-CM | POA: Diagnosis not present

## 2018-06-21 DIAGNOSIS — Z8679 Personal history of other diseases of the circulatory system: Secondary | ICD-10-CM | POA: Diagnosis not present

## 2018-06-21 DIAGNOSIS — I252 Old myocardial infarction: Secondary | ICD-10-CM | POA: Diagnosis not present

## 2018-06-21 DIAGNOSIS — Z87891 Personal history of nicotine dependence: Secondary | ICD-10-CM | POA: Diagnosis not present

## 2018-06-21 DIAGNOSIS — E785 Hyperlipidemia, unspecified: Secondary | ICD-10-CM | POA: Diagnosis not present

## 2018-06-21 DIAGNOSIS — I214 Non-ST elevation (NSTEMI) myocardial infarction: Secondary | ICD-10-CM | POA: Diagnosis not present

## 2018-06-21 DIAGNOSIS — I420 Dilated cardiomyopathy: Secondary | ICD-10-CM | POA: Diagnosis not present

## 2018-06-21 DIAGNOSIS — I700 Atherosclerosis of aorta: Secondary | ICD-10-CM | POA: Diagnosis not present

## 2018-06-21 DIAGNOSIS — I10 Essential (primary) hypertension: Secondary | ICD-10-CM | POA: Diagnosis not present

## 2018-06-21 DIAGNOSIS — I4891 Unspecified atrial fibrillation: Secondary | ICD-10-CM

## 2018-06-21 DIAGNOSIS — I2510 Atherosclerotic heart disease of native coronary artery without angina pectoris: Secondary | ICD-10-CM | POA: Diagnosis not present

## 2018-06-21 DIAGNOSIS — Z1159 Encounter for screening for other viral diseases: Secondary | ICD-10-CM | POA: Diagnosis not present

## 2018-06-21 DIAGNOSIS — E782 Mixed hyperlipidemia: Secondary | ICD-10-CM | POA: Diagnosis not present

## 2018-06-21 HISTORY — PX: CARDIOVERSION: SHX1299

## 2018-06-21 HISTORY — PX: TEE WITHOUT CARDIOVERSION: SHX5443

## 2018-06-21 LAB — COMPREHENSIVE METABOLIC PANEL
ALT: 28 U/L (ref 0–44)
AST: 21 U/L (ref 15–41)
Albumin: 3.5 g/dL (ref 3.5–5.0)
Alkaline Phosphatase: 98 U/L (ref 38–126)
Anion gap: 7 (ref 5–15)
BUN: 11 mg/dL (ref 6–20)
CO2: 24 mmol/L (ref 22–32)
Calcium: 8.9 mg/dL (ref 8.9–10.3)
Chloride: 107 mmol/L (ref 98–111)
Creatinine, Ser: 0.98 mg/dL (ref 0.61–1.24)
GFR calc Af Amer: >60 mL/min (ref >60)
GFR calc non Af Amer: >60 mL/min (ref >60)
Glucose, Bld: 153 mg/dL — ABNORMAL HIGH (ref 70–99)
Potassium: 4 mmol/L (ref 3.5–5.1)
Sodium: 138 mmol/L (ref 135–145)
Total Bilirubin: 0.9 mg/dL (ref 0.3–1.2)
Total Protein: 6 g/dL — ABNORMAL LOW (ref 6.5–8.1)

## 2018-06-21 LAB — CBC WITH DIFFERENTIAL/PLATELET
Abs Immature Granulocytes: 0.02 10*3/uL (ref 0.00–0.07)
Basophils Absolute: 0.1 10*3/uL (ref 0.0–0.1)
Basophils Relative: 1 %
Eosinophils Absolute: 0.2 10*3/uL (ref 0.0–0.5)
Eosinophils Relative: 2 %
HCT: 48.3 % (ref 39.0–52.0)
Hemoglobin: 16.3 g/dL (ref 13.0–17.0)
Immature Granulocytes: 0 %
Lymphocytes Relative: 18 %
Lymphs Abs: 1.4 10*3/uL (ref 0.7–4.0)
MCH: 29.1 pg (ref 26.0–34.0)
MCHC: 33.7 g/dL (ref 30.0–36.0)
MCV: 86.3 fL (ref 80.0–100.0)
Monocytes Absolute: 0.9 10*3/uL (ref 0.1–1.0)
Monocytes Relative: 12 %
Neutro Abs: 5.1 10*3/uL (ref 1.7–7.7)
Neutrophils Relative %: 67 %
Platelets: 172 10*3/uL (ref 150–400)
RBC: 5.6 MIL/uL (ref 4.22–5.81)
RDW: 13 % (ref 11.5–15.5)
WBC: 7.6 10*3/uL (ref 4.0–10.5)
nRBC: 0 % (ref 0.0–0.2)

## 2018-06-21 LAB — HIV ANTIBODY (ROUTINE TESTING W REFLEX): HIV Screen 4th Generation wRfx: NONREACTIVE

## 2018-06-21 SURGERY — ECHOCARDIOGRAM, TRANSESOPHAGEAL
Anesthesia: Monitor Anesthesia Care

## 2018-06-21 MED ORDER — EZETIMIBE 10 MG PO TABS: 10.0000 mg | ORAL_TABLET | Freq: Every day | ORAL | 0 refills | Status: DC

## 2018-06-21 MED ORDER — APIXABAN 5 MG PO TABS: 5.0000 mg | ORAL_TABLET | Freq: Two times a day (BID) | ORAL | 0 refills | Status: DC

## 2018-06-21 MED ORDER — DILTIAZEM HCL ER COATED BEADS 180 MG PO CP24: 180.0000 mg | ORAL_CAPSULE | Freq: Every day | ORAL | 0 refills | Status: DC

## 2018-06-21 MED ORDER — DILTIAZEM HCL 90 MG PO TABS
45.0000 mg | ORAL_TABLET | Freq: Four times a day (QID) | ORAL | Status: DC
  Filled 2018-06-21: qty 0.5

## 2018-06-21 MED ORDER — PROPOFOL 500 MG/50ML IV EMUL
INTRAVENOUS | Status: DC | PRN
  Administered 2018-06-21: 100 ug/kg/min via INTRAVENOUS

## 2018-06-21 MED ORDER — DILTIAZEM HCL 30 MG PO TABS
45.0000 mg | ORAL_TABLET | Freq: Four times a day (QID) | ORAL | Status: DC
  Administered 2018-06-21: 45 mg via ORAL
  Filled 2018-06-21 (×3): qty 1.5

## 2018-06-21 MED ORDER — OMEGA-3-ACID ETHYL ESTERS 1 G PO CAPS: 1.0000 g | ORAL_CAPSULE | Freq: Two times a day (BID) | ORAL | 0 refills | Status: DC

## 2018-06-21 MED FILL — ELIQUIS 5 MG TABLET: 5 | 30 days supply | Qty: 60 | Fill #0

## 2018-06-21 MED FILL — DILTIAZEM 24HR ER 180 MG CA: 180 | 30 days supply | Qty: 30 | Fill #0

## 2018-06-21 MED FILL — EZETIMIBE 10 MG TABS: 10 | 30 days supply | Qty: 30 | Fill #0

## 2018-06-21 NOTE — Anesthesia Preprocedure Evaluation (Addendum)
Anesthesia Evaluation  Patient identified by MRN, date of birth, ID band Patient awake    Reviewed: Allergy & Precautions, NPO status , Patient's Chart, lab work & pertinent test results  History of Anesthesia Complications Negative for: history of anesthetic complications  Airway Mallampati: II  TM Distance: >3 FB Neck ROM: Full    Dental  (+) Dental Advisory Given, Teeth Intact   Pulmonary shortness of breath, COPD,  COPD inhaler, former smoker,    breath sounds clear to auscultation       Cardiovascular hypertension, Pt. on medications and Pt. on home beta blockers (-) angina+ CAD, + Past MI, + Cardiac Stents and + DOE   Rhythm:Irregular Rate:Tachycardia     Neuro/Psych  Headaches, Seizures -, Well Controlled,  CVA, No Residual Symptoms negative psych ROS   GI/Hepatic Neg liver ROS, GERD  Controlled and Medicated,  Endo/Other   Obesity   Renal/GU negative Renal ROS     Musculoskeletal negative musculoskeletal ROS (+)   Abdominal   Peds  Hematology negative hematology ROS (+)   Anesthesia Other Findings   Reproductive/Obstetrics                            Anesthesia Physical Anesthesia Plan  ASA: III  Anesthesia Plan: MAC and General   Post-op Pain Management:    Induction: Intravenous  PONV Risk Score and Plan: 1 and Propofol infusion and Treatment may vary due to age or medical condition  Airway Management Planned: Nasal Cannula and Natural Airway  Additional Equipment: None  Intra-op Plan:   Post-operative Plan:   Informed Consent: I have reviewed the patients History and Physical, chart, labs and discussed the procedure including the risks, benefits and alternatives for the proposed anesthesia with the patient or authorized representative who has indicated his/her understanding and acceptance.       Plan Discussed with: CRNA and Anesthesiologist  Anesthesia Plan  Comments: ( Will start TEE under MAC, convert to [place] with natural airway if cardioversion to be performed)       Anesthesia Quick Evaluation

## 2018-06-21 NOTE — Anesthesia Postprocedure Evaluation (Signed)
Anesthesia Post Note  Patient: Peter Barnett  Procedure(s) Performed: TRANSESOPHAGEAL ECHOCARDIOGRAM (TEE) (N/A ) CARDIOVERSION (N/A )     Patient location during evaluation: PACU Anesthesia Type: General Level of consciousness: awake and alert Pain management: pain level controlled Vital Signs Assessment: post-procedure vital signs reviewed and stable Respiratory status: spontaneous breathing, nonlabored ventilation and respiratory function stable Cardiovascular status: blood pressure returned to baseline and stable Postop Assessment: no apparent nausea or vomiting Anesthetic complications: no    Last Vitals:  Vitals:   06/21/18 1242 06/21/18 1256  BP: 117/73 119/76  Pulse: 71 (!) 52  Resp: 14 13  Temp:    SpO2: 97% 96%    Last Pain:  Vitals:   06/21/18 1256  TempSrc:   PainSc: 0-No pain                 Audry Pili

## 2018-06-21 NOTE — Discharge Summary (Signed)
Discharge Summary  Peter Barnett PPJ:[id number] DOB: 11-Jan-1961  PCP: [name], MD  Admit date: 06/19/2018 Discharge date: 06/21/2018  Time spent: 8mins, more than 50% time spent on coordination of care. Case discussed with cardiology Dr Irish Lack   Recommendations for Outpatient Follow-up:  1. F/u with PCP within a week  for hospital discharge follow up, repeat cbc/bmp at follow up. pcp to arrange outpatient sleep study 2. F/u with cardiology Dr Mauricio Po, patient will need to discuss with cardiology/neurology for eliquis duration after finish a month of eliquis. Discharge meds delivered to bedside prior to discharge  Discharge Diagnoses:  Active Hospital Problems   Diagnosis Date Noted  . Atrial fibrillation with rapid ventricular response ([place]) 06/20/2018  . Atrial fibrillation ([place]) 06/20/2018    Resolved Hospital Problems  No resolved problems to display.    Discharge Condition: stable  Diet recommendation: heart healthy  Filed Weights   06/19/18 2345 06/20/18 0308 06/21/18 0509  Weight: 112.5 kg 110.6 kg 110.9 kg    History of present illness: (per admitting MD Dr Rodena Piety) PCP: [name], MD Patient coming from: Home lives at home with his wife Chief Complaint: Shortness of breath  HPI: Peter Barnett is a 58 y.o. 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  He denied any chest pain.  Denied any nausea vomiting diarrhea abdominal pain fever or chills.  Denied any sick contacts.  Denies any travel.  Denies any abdominal pain urinary complaints.  He was seen by Dr. 1 nausea approximately 3 years ago but has not followed up with him since then.  His care has been shifted to [facility].  Cath in 2017 showed 2 vessel obstructive CAD with 99%  distal RCA and 95% first diagonal.  Received a DES to first diagonal he was discharged on aspirin and Brilinta for 1 year.   This morning he denies any chest pain chest pressure or shortness of breath or palpitations.  He admits to drinking 1 beer per day.  He denies smoking.  Denies use of illicit drugs.  ED Course: Patient was started on Cardizem drip for A. fib with RVR  Hospital Course:  Active Problems:   Atrial fibrillation with rapid ventricular response (HCC)   Atrial fibrillation (HCC)  New onset of Afib -he presented with sob, no hypoxia, COVID 19 test negative, he is found in afib/RVR  -he is treated with heparin drip and cardizem drip initially, cardiology consulted, s/p TEE /cardioversion on 5/13 -he is discharged on cardizem CR 180 (home meds lopressor discontinued), eliquis, cardiology checked with his neurologist who is oked with eliquis , he is advised to take eliquis at least for a month -patient is to follow up with cardiology to decided further eliquis use after a month -patient is encouraged to have outpatient sleep study ( he declined it in the past)  H/o CAD s/p stents  On plavix, started zetia ( cost 12 dollars without insurance, but with insurance sot 100dollars), continue imdur Reports allergic to lipitor,  fish oil Lovaza copy is more than 50dollars D/c asa  H/o right frontal AVM s/p craniotomy in 1980 c/b with recurrent intracranial hemorrhage in 2016 H/o seizures -stable on trileptal -he is followed with neurology at [facility]  Procedures:  TEE/ cardioversion on 5/13  Consultations:  cardiology  Discharge Exam: BP 119/76 (BP  Location: Left Arm)   Pulse (!) 52   Temp 97.6 F (36.4 C) (Oral)   Resp 13   Ht 5\' 11"  (1.803 m)   Wt 110.9 kg   SpO2 96%   BMI 34.10 kg/m   General: NAD Cardiovascular: RRR Respiratory: CTABL  Discharge Instructions You were cared for by a hospitalist during your hospital stay. If you have any questions about your  discharge medications or the care you received while you were in the hospital after you are discharged, you can call the unit and asked to speak with the hospitalist on call if the hospitalist that took care of you is not available. Once you are discharged, your primary care physician will handle any further medical issues. Please note that NO REFILLS for any discharge medications will be authorized once you are discharged, as it is imperative that you return to your primary care physician (or establish a relationship with a primary care physician if you do not have one) for your aftercare needs so that they can reassess your need for medications and monitor your lab values.  Discharge Instructions    Amb referral to AFIB Clinic   Complete by:  As directed    Diet - low sodium heart healthy   Complete by:  As directed    Increase activity slowly   Complete by:  As directed      Allergies as of 06/21/2018      Reactions   Tramadol Other (See Comments)   SEIZURES "Staring off spells"   Atorvastatin Other (See Comments)   MYALGIAS      Medication List    STOP taking these medications   aspirin EC 81 MG tablet   metoprolol tartrate 25 MG tablet Commonly known as:  LOPRESSOR     TAKE these medications   acetaminophen 500 MG tablet Commonly known as:  TYLENOL Take 1 tablet (500 mg total) by mouth every 6 (six) hours as needed for headache.   albuterol 108 (90 Base) MCG/ACT inhaler Commonly known as:  VENTOLIN HFA Inhale 2 puffs into the lungs every 6 (six) hours as needed for wheezing or shortness of breath.   apixaban 5 MG Tabs tablet Commonly known as:  ELIQUIS Take 1 tablet (5 mg total) by mouth every 12 (twelve) hours.   butalbital-acetaminophen-caffeine 50-325-40 MG tablet Commonly known as:  FIORICET Take 1 tablet by mouth every 6 (six) hours as needed for headache.   Cholecalciferol 25 MCG (1000 UT) tablet Take 1,000 Units by mouth daily.   clopidogrel 75 MG tablet  Commonly known as:  PLAVIX TAKE 1 TABLET BY MOUTH EVERY DAY What changed:  Another medication with the same name was removed. Continue taking this medication, and follow the directions you see here.   diltiazem 180 MG 24 hr capsule Commonly known as:  Cardizem CD Take 1 capsule (180 mg total) by mouth daily.   docusate sodium 100 MG capsule Commonly known as:  COLACE Take 200 mg by mouth daily.   ezetimibe 10 MG tablet Commonly known as:  ZETIA Take 1 tablet (10 mg total) by mouth daily. Start taking on:  Jun 22, 2018   famotidine 20 MG tablet Commonly known as:  PEPCID Take 20 mg by mouth daily.   HYDROcodone-acetaminophen 10-325 MG tablet Commonly known as:  NORCO Take 1 tablet by mouth every 6 (six) hours as needed (pain).   isosorbide mononitrate 30 MG 24 hr tablet Commonly known as:  IMDUR Take 1 tablet (30 mg  total) by mouth daily. Please make overdue appt with Dr. Irish Lack for future refills. 2nd attempt.   nitroGLYCERIN 0.4 MG SL tablet Commonly known as:  NITROSTAT Place 1 tablet (0.4 mg total) under the tongue every 5 (five) minutes as needed for chest pain (CP or SOB).   oxcarbazepine 600 MG tablet Commonly known as:  TRILEPTAL TAKE ONE TABLET BY MOUTH TWICE DAILY   rosuvastatin 40 MG tablet Commonly known as:  CRESTOR Take 1 tablet (40 mg total) by mouth daily.   vitamin B-12 1000 MCG tablet Commonly known as:  CYANOCOBALAMIN Take 1,000 mcg by mouth daily.      Allergies  Allergen Reactions  . Tramadol Other (See Comments)    SEIZURES "Staring off spells"  . Atorvastatin Other (See Comments)    MYALGIAS   Follow-up Information    Corrington, Kip A, MD Follow up in 1 week(s).   Specialty:  Family Medicine Why:  hospital discharge follow up. pcp to arrange outpatient sleep study  Contact information: [place] [address] [place] [address] [phone]        Ellis Parents, MD Follow up in 2 week(s).   Specialty:  Internal Medicine  Why:  please discuss with your cardiology regarding the need of ongoing eliquis use  you are discharged on eliquis for a month.  Contact information: [place] [address] [phone]            The results of significant diagnostics from this hospitalization (including imaging, microbiology, ancillary and laboratory) are listed below for reference.    Significant Diagnostic Studies: Dg Chest Port 1 View  Result Date: 06/20/2018 CLINICAL DATA:  Shortness of breath EXAM: PORTABLE CHEST 1 VIEW COMPARISON:  12/12/2015 FINDINGS: The heart size and mediastinal contours are within normal limits. Both lungs are clear. The visualized skeletal structures are unremarkable. IMPRESSION: No active disease. Electronically Signed   By: Ulyses Jarred M.D.   On: 06/20/2018 00:36    Microbiology: Recent Results (from the past 240 hour(s))  SARS Coronavirus 2 (CEPHEID - Performed in [place] hospital lab), Hosp Order     Status: None   Collection Time: 06/20/18 12:17 AM  Result Value Ref Range Status   SARS Coronavirus 2 NEGATIVE NEGATIVE Final    Comment: (NOTE) If result is NEGATIVE SARS-CoV-2 target nucleic acids are NOT DETECTED. The SARS-CoV-2 RNA is generally detectable in upper and lower  respiratory specimens during the acute phase of infection. The lowest  concentration of SARS-CoV-2 viral copies this assay can detect is 250  copies / mL. A negative result does not preclude SARS-CoV-2 infection  and should not be used as the sole basis for treatment or other  patient management decisions.  A negative result may occur with  improper specimen collection / handling, submission of specimen other  than nasopharyngeal swab, presence of viral mutation(s) within the  areas targeted by this assay, and inadequate number of viral copies  (<250 copies / mL). A negative result must be combined with clinical  observations, patient history, and epidemiological  information. If result is POSITIVE SARS-CoV-2 target nucleic acids are DETECTED. The SARS-CoV-2 RNA is generally detectable in upper and lower  respiratory specimens dur ing the acute phase of infection.  Positive  results are indicative of active infection with SARS-CoV-2.  Clinical  correlation with patient history and other diagnostic information is  necessary to determine patient infection status.  Positive results do  not rule out bacterial infection  or co-infection with other viruses. If result is PRESUMPTIVE POSTIVE SARS-CoV-2 nucleic acids MAY BE PRESENT.   A presumptive positive result was obtained on the submitted specimen  and confirmed on repeat testing.  While 2019 novel coronavirus  (SARS-CoV-2) nucleic acids may be present in the submitted sample  additional confirmatory testing may be necessary for epidemiological  and / or clinical management purposes  to differentiate between  SARS-CoV-2 and other Sarbecovirus currently known to infect humans.  If clinically indicated additional testing with an alternate test  methodology [phone]) is advised. The SARS-CoV-2 RNA is generally  detectable in upper and lower respiratory sp ecimens during the acute  phase of infection. The expected result is Negative. Fact Sheet for Patients:  StrictlyIdeas.no Fact Sheet for Healthcare Providers: BankingDealers.co.za This test is not yet approved or cleared by the [place] FDA and has been authorized for detection and/or diagnosis of SARS-CoV-2 by FDA under an Emergency Use Authorization (EUA).  This EUA will remain in effect (meaning this test can be used) for the duration of the COVID-19 declaration under Section 564(b)(1) of the Act, 21 U.S.C. section 360bbb-3(b)(1), unless the authorization is terminated or revoked sooner. Performed at [facility], [place] [address], [place], [place] [address]      Labs: Basic Metabolic  Panel: Recent Labs  Lab 06/20/18 0007 06/21/18 0356  NA 141 138  K 3.8 4.0  CL 103 107  CO2 22 24  GLUCOSE 114* 153*  BUN 10 11  CREATININE 1.04 0.98  CALCIUM 9.7 8.9  MG 2.1  --    Liver Function Tests: Recent Labs  Lab 06/21/18 0356  AST 21  ALT 28  ALKPHOS 98  BILITOT 0.9  PROT 6.0*  ALBUMIN 3.5   No results for input(s): LIPASE, AMYLASE in the last 168 hours. No results for input(s): AMMONIA in the last 168 hours. CBC: Recent Labs  Lab 06/20/18 0007 06/21/18 0356  WBC 10.4 7.6  NEUTROABS 7.4 5.1  HGB 17.7* 16.3  HCT 52.4* 48.3  MCV 86.6 86.3  PLT 207 172   Cardiac Enzymes: Recent Labs  Lab 06/20/18 0007 06/20/18 0212 06/20/18 0749 06/20/18 1318  TROPONINI <0.03 <0.03 <0.03 <0.03   BNP: BNP (last 3 results) No results for input(s): BNP in the last 8760 hours.  ProBNP (last 3 results) No results for input(s): PROBNP in the last 8760 hours.  CBG: No results for input(s): GLUCAP in the last 168 hours.     Signed:  Florencia Reasons MD, PhD  [facility] 06/21/2018, 3:12 PM

## 2018-06-21 NOTE — H&P (View-Only) (Signed)
Progress Note  Patient Name: Peter Barnett Date of Encounter: 06/21/2018  Primary Cardiologist: Ellis Parents, MD   Subjective   No CP.  Wants to go home today.  Inpatient Medications    Scheduled Meds: . apixaban  5 mg Oral Q12H  . clopidogrel  75 mg Oral Daily  . diltiazem  45 mg Oral Q6H  . ezetimibe  10 mg Oral Daily  . isosorbide mononitrate  30 mg Oral Daily  . Oxcarbazepine  600 mg Oral BID  . rosuvastatin  40 mg Oral q1800  . sodium chloride flush  3 mL Intravenous Q12H   Continuous Infusions: . sodium chloride    . sodium chloride     PRN Meds: HYDROcodone-acetaminophen, LORazepam, sodium chloride flush   Vital Signs    Vitals:   06/20/18 0544 06/20/18 1000 06/20/18 2104 06/21/18 0509  BP: (!) 147/85 126/88 125/86 112/84  Pulse: 85  (!) 141 77  Resp:  19    Temp: 97.6 F (36.4 C)  98 F (36.7 C) 97.9 F (36.6 C)  TempSrc: Oral  Oral Oral  SpO2: 96%  95% 95%  Weight:    110.9 kg  Height:        Intake/Output Summary (Last 24 hours) at 06/21/2018 0852 Last data filed at 06/20/2018 1536 Gross per 24 hour  Intake 135.08 ml  Output -  Net 135.08 ml   Last 3 Weights 06/21/2018 06/20/2018 06/19/2018  Weight (lbs) 244 lb 8 oz 243 lb 14.4 oz 248 lb  Weight (kg) 110.904 kg 110.632 kg 112.492 kg      Telemetry    AFib - Personally Reviewed  ECG      Physical Exam   GEN: No acute distress.   Neck: No JVD Cardiac: irregular, no murmurs, rubs, or gallops.  Respiratory: Clear to auscultation bilaterally. GI: Soft, nontender, non-distended  MS: No edema; No deformity. Neuro:  Nonfocal  Psych: Normal affect   Labs    Chemistry Recent Labs  Lab 06/20/18 0007 06/21/18 0356  NA 141 138  K 3.8 4.0  CL 103 107  CO2 22 24  GLUCOSE 114* 153*  BUN 10 11  CREATININE 1.04 0.98  CALCIUM 9.7 8.9  PROT  --  6.0*  ALBUMIN  --  3.5  AST  --  21  ALT  --  28  ALKPHOS  --  98  BILITOT  --  0.9  GFRNONAA >60 >60  GFRAA >60 >60  ANIONGAP 16*  7     Hematology Recent Labs  Lab 06/20/18 0007 06/21/18 0356  WBC 10.4 7.6  RBC 6.05* 5.60  HGB 17.7* 16.3  HCT 52.4* 48.3  MCV 86.6 86.3  MCH 29.3 29.1  MCHC 33.8 33.7  RDW 12.7 13.0  PLT 207 172    Cardiac Enzymes Recent Labs  Lab 06/20/18 0007 06/20/18 0212 06/20/18 0749 06/20/18 1318  TROPONINI <0.03 <0.03 <0.03 <0.03   No results for input(s): TROPIPOC in the last 168 hours.   BNPNo results for input(s): BNP, PROBNP in the last 168 hours.   DDimer No results for input(s): DDIMER in the last 168 hours.   Radiology    Dg Chest Port 1 View  Result Date: 06/20/2018 CLINICAL DATA:  Shortness of breath EXAM: PORTABLE CHEST 1 VIEW COMPARISON:  12/12/2015 FINDINGS: The heart size and mediastinal contours are within normal limits. Both lungs are clear. The visualized skeletal structures are unremarkable. IMPRESSION: No active disease. Electronically Signed   By: Lennette Bihari  Collins Scotland M.D.   On: 06/20/2018 00:36    Cardiac Studies   Normal LV function  Patient Profile     58 y.o. male with AFib, CAD  Assessment & Plan    AFib: Plan for TEE/CV today.  All questons answered.  CAD: No angina.  IV dilt switched to oral.  Heparin switched to ELiquis.  Plan for discharge later today post cardioversion.     For questions or updates, please contact [place] Please consult [email or web address] for contact info under        Signed, Larae Grooms, MD  06/21/2018, 8:52 AM

## 2018-06-21 NOTE — Transfer of Care (Signed)
Immediate Anesthesia Transfer of Care Note  Patient: Peter Barnett  Procedure(s) Performed: TRANSESOPHAGEAL ECHOCARDIOGRAM (TEE) (N/A ) CARDIOVERSION (N/A )  Patient Location: Endoscopy Unit  Anesthesia Type:MAC  Level of Consciousness: awake, alert  and oriented  Airway & Oxygen Therapy: Patient Spontanous Breathing and Patient connected to nasal cannula oxygen  Post-op Assessment: Report given to RN and Post -op Vital signs reviewed and stable  Post vital signs: Reviewed and stable  Last Vitals:  Vitals Value Taken Time  BP 103/75   Temp    Pulse 80 06/21/2018 12:18 PM  Resp 19 06/21/2018 12:18 PM  SpO2 95 % 06/21/2018 12:18 PM  Vitals shown include unvalidated device data.  Last Pain:  Vitals:   06/21/18 1217  TempSrc: Oral  PainSc: 0-No pain      Patients Stated Pain Goal: 0 (77/82/42 3536)  Complications: No apparent anesthesia complications

## 2018-06-21 NOTE — Interval H&P Note (Signed)
History and Physical Interval Note:  06/21/2018 11:36 AM  Peter Barnett  has presented today for surgery, with the diagnosis of afib.  The various methods of treatment have been discussed with the patient and family. After consideration of risks, benefits and other options for treatment, the patient has consented to  Procedure(s): TRANSESOPHAGEAL ECHOCARDIOGRAM (TEE) (N/A) CARDIOVERSION (N/A) as a surgical intervention.  The patient's history has been reviewed, patient examined, no change in status, stable for surgery.  I have reviewed the patient's chart and labs.  Questions were answered to the patient's satisfaction.     Mertie Moores

## 2018-06-21 NOTE — CV Procedure (Signed)
    Transesophageal Echocardiogram Note  Peter Barnett [id number] 01/10/1961  Procedure: Transesophageal Echocardiogram Indications: rapid atrial fib   Procedure Details Consent: Obtained Time Out: Verified patient identification, verified procedure, site/side was marked, verified correct patient position, special equipment/implants available, Radiology Safety Procedures followed,  medications/allergies/relevent history reviewed, required imaging and test results available.  Performed  Medications:  During this procedure the patient is administered a Propofol drip by [name], CRNA .  Total of 270 mg for the TEE and cardioversion   Left Ventrical:  Normal LV functin   Mitral Valve: trace MR   Aortic Valve:  Grossly normal   Tricuspid Valve: grossly normal   Pulmonic Valve: not visualized   Left Atrium/ Left atrial appendage:  No thrombi   Atrial septum: intact by color doppler   Aorta: mild calcified plaque    Complications: No apparent complications Patient did tolerate procedure well.   [name], [name]., MD, [facility] 06/21/2018, 12:11 PM

## 2018-06-21 NOTE — Discharge Instructions (Signed)

## 2018-06-21 NOTE — Progress Notes (Signed)
Progress Note  Patient Name: Peter Barnett Date of Encounter: 06/21/2018  Primary Cardiologist: Ellis Parents, MD   Subjective   No CP.  Wants to go home today.  Inpatient Medications    Scheduled Meds: . apixaban  5 mg Oral Q12H  . clopidogrel  75 mg Oral Daily  . diltiazem  45 mg Oral Q6H  . ezetimibe  10 mg Oral Daily  . isosorbide mononitrate  30 mg Oral Daily  . Oxcarbazepine  600 mg Oral BID  . rosuvastatin  40 mg Oral q1800  . sodium chloride flush  3 mL Intravenous Q12H   Continuous Infusions: . sodium chloride    . sodium chloride     PRN Meds: HYDROcodone-acetaminophen, LORazepam, sodium chloride flush   Vital Signs    Vitals:   06/20/18 0544 06/20/18 1000 06/20/18 2104 06/21/18 0509  BP: (!) 147/85 126/88 125/86 112/84  Pulse: 85  (!) 141 77  Resp:  19    Temp: 97.6 F (36.4 C)  98 F (36.7 C) 97.9 F (36.6 C)  TempSrc: Oral  Oral Oral  SpO2: 96%  95% 95%  Weight:    110.9 kg  Height:        Intake/Output Summary (Last 24 hours) at 06/21/2018 0852 Last data filed at 06/20/2018 1536 Gross per 24 hour  Intake 135.08 ml  Output -  Net 135.08 ml   Last 3 Weights 06/21/2018 06/20/2018 06/19/2018  Weight (lbs) 244 lb 8 oz 243 lb 14.4 oz 248 lb  Weight (kg) 110.904 kg 110.632 kg 112.492 kg      Telemetry    AFib - Personally Reviewed  ECG      Physical Exam   GEN: No acute distress.   Neck: No JVD Cardiac: irregular, no murmurs, rubs, or gallops.  Respiratory: Clear to auscultation bilaterally. GI: Soft, nontender, non-distended  MS: No edema; No deformity. Neuro:  Nonfocal  Psych: Normal affect   Labs    Chemistry Recent Labs  Lab 06/20/18 0007 06/21/18 0356  NA 141 138  K 3.8 4.0  CL 103 107  CO2 22 24  GLUCOSE 114* 153*  BUN 10 11  CREATININE 1.04 0.98  CALCIUM 9.7 8.9  PROT  --  6.0*  ALBUMIN  --  3.5  AST  --  21  ALT  --  28  ALKPHOS  --  98  BILITOT  --  0.9  GFRNONAA >60 >60  GFRAA >60 >60  ANIONGAP 16*  7     Hematology Recent Labs  Lab 06/20/18 0007 06/21/18 0356  WBC 10.4 7.6  RBC 6.05* 5.60  HGB 17.7* 16.3  HCT 52.4* 48.3  MCV 86.6 86.3  MCH 29.3 29.1  MCHC 33.8 33.7  RDW 12.7 13.0  PLT 207 172    Cardiac Enzymes Recent Labs  Lab 06/20/18 0007 06/20/18 0212 06/20/18 0749 06/20/18 1318  TROPONINI <0.03 <0.03 <0.03 <0.03   No results for input(s): TROPIPOC in the last 168 hours.   BNPNo results for input(s): BNP, PROBNP in the last 168 hours.   DDimer No results for input(s): DDIMER in the last 168 hours.   Radiology    Dg Chest Port 1 View  Result Date: 06/20/2018 CLINICAL DATA:  Shortness of breath EXAM: PORTABLE CHEST 1 VIEW COMPARISON:  12/12/2015 FINDINGS: The heart size and mediastinal contours are within normal limits. Both lungs are clear. The visualized skeletal structures are unremarkable. IMPRESSION: No active disease. Electronically Signed   By: Lennette Bihari  Collins Scotland M.D.   On: 06/20/2018 00:36    Cardiac Studies   Normal LV function  Patient Profile     58 y.o. male with AFib, CAD  Assessment & Plan    AFib: Plan for TEE/CV today.  All questons answered.  CAD: No angina.  IV dilt switched to oral.  Heparin switched to ELiquis.  Plan for discharge later today post cardioversion.     For questions or updates, please contact [place] Please consult [email or web address] for contact info under        Signed, Larae Grooms, MD  06/21/2018, 8:52 AM

## 2018-06-22 ENCOUNTER — Telehealth (HOSPITAL_COMMUNITY): Payer: Self-pay | Admitting: *Deleted

## 2018-06-22 NOTE — Telephone Encounter (Signed)
Patient is calling to get back in with his cardiologist with [facility] Dr. Mauricio Po. He will call back if unable to secure an appt with his office.

## 2018-06-22 NOTE — Telephone Encounter (Signed)
LMOM for pt to [facility] to sched appt.  Pt TEE/DCCV in hospital and referred to afib clinic for f/u

## 2018-06-23 ENCOUNTER — Encounter (HOSPITAL_COMMUNITY): Payer: Self-pay | Admitting: Cardiovascular Disease

## 2018-06-23 DIAGNOSIS — I4891 Unspecified atrial fibrillation: Secondary | ICD-10-CM | POA: Diagnosis not present

## 2018-06-23 DIAGNOSIS — R0683 Snoring: Secondary | ICD-10-CM | POA: Diagnosis not present

## 2018-06-23 DIAGNOSIS — Z09 Encounter for follow-up examination after completed treatment for conditions other than malignant neoplasm: Secondary | ICD-10-CM | POA: Diagnosis not present

## 2018-06-23 DIAGNOSIS — I10 Essential (primary) hypertension: Secondary | ICD-10-CM | POA: Diagnosis not present

## 2018-06-28 DIAGNOSIS — I252 Old myocardial infarction: Secondary | ICD-10-CM | POA: Diagnosis not present

## 2018-06-28 DIAGNOSIS — I2510 Atherosclerotic heart disease of native coronary artery without angina pectoris: Secondary | ICD-10-CM | POA: Diagnosis not present

## 2018-06-28 DIAGNOSIS — E7800 Pure hypercholesterolemia, unspecified: Secondary | ICD-10-CM | POA: Diagnosis not present

## 2018-06-28 DIAGNOSIS — Z955 Presence of coronary angioplasty implant and graft: Secondary | ICD-10-CM | POA: Diagnosis not present

## 2018-06-28 DIAGNOSIS — I480 Paroxysmal atrial fibrillation: Secondary | ICD-10-CM | POA: Diagnosis not present

## 2018-06-28 DIAGNOSIS — I10 Essential (primary) hypertension: Secondary | ICD-10-CM | POA: Diagnosis not present

## 2018-10-04 DIAGNOSIS — I10 Essential (primary) hypertension: Secondary | ICD-10-CM | POA: Diagnosis not present

## 2018-10-04 DIAGNOSIS — N200 Calculus of kidney: Secondary | ICD-10-CM | POA: Diagnosis not present

## 2018-10-04 DIAGNOSIS — R310 Gross hematuria: Secondary | ICD-10-CM | POA: Diagnosis not present

## 2018-10-09 DIAGNOSIS — I10 Essential (primary) hypertension: Secondary | ICD-10-CM | POA: Diagnosis not present

## 2018-10-09 DIAGNOSIS — Z955 Presence of coronary angioplasty implant and graft: Secondary | ICD-10-CM | POA: Diagnosis not present

## 2018-10-09 DIAGNOSIS — I252 Old myocardial infarction: Secondary | ICD-10-CM | POA: Diagnosis not present

## 2018-10-09 DIAGNOSIS — I480 Paroxysmal atrial fibrillation: Secondary | ICD-10-CM | POA: Diagnosis not present

## 2018-10-09 DIAGNOSIS — I2510 Atherosclerotic heart disease of native coronary artery without angina pectoris: Secondary | ICD-10-CM | POA: Diagnosis not present

## 2018-10-09 DIAGNOSIS — E7800 Pure hypercholesterolemia, unspecified: Secondary | ICD-10-CM | POA: Diagnosis not present

## 2018-10-10 DIAGNOSIS — R310 Gross hematuria: Secondary | ICD-10-CM | POA: Diagnosis not present

## 2018-10-10 DIAGNOSIS — N200 Calculus of kidney: Secondary | ICD-10-CM | POA: Diagnosis not present

## 2018-10-10 DIAGNOSIS — N281 Cyst of kidney, acquired: Secondary | ICD-10-CM | POA: Diagnosis not present

## 2018-10-10 DIAGNOSIS — E279 Disorder of adrenal gland, unspecified: Secondary | ICD-10-CM | POA: Diagnosis not present

## 2018-10-19 DIAGNOSIS — R69 Illness, unspecified: Secondary | ICD-10-CM | POA: Diagnosis not present

## 2018-10-25 DIAGNOSIS — D485 Neoplasm of uncertain behavior of skin: Secondary | ICD-10-CM | POA: Diagnosis not present

## 2018-10-25 DIAGNOSIS — D2361 Other benign neoplasm of skin of right upper limb, including shoulder: Secondary | ICD-10-CM | POA: Diagnosis not present

## 2018-10-25 DIAGNOSIS — L309 Dermatitis, unspecified: Secondary | ICD-10-CM | POA: Diagnosis not present

## 2018-10-27 DIAGNOSIS — I480 Paroxysmal atrial fibrillation: Secondary | ICD-10-CM | POA: Diagnosis not present

## 2018-10-27 DIAGNOSIS — Z8774 Personal history of (corrected) congenital malformations of heart and circulatory system: Secondary | ICD-10-CM | POA: Diagnosis not present

## 2018-10-27 DIAGNOSIS — I2510 Atherosclerotic heart disease of native coronary artery without angina pectoris: Secondary | ICD-10-CM | POA: Diagnosis not present

## 2018-10-27 DIAGNOSIS — R310 Gross hematuria: Secondary | ICD-10-CM | POA: Insufficient documentation

## 2018-10-27 DIAGNOSIS — G40909 Epilepsy, unspecified, not intractable, without status epilepticus: Secondary | ICD-10-CM | POA: Diagnosis not present

## 2018-10-27 DIAGNOSIS — G8929 Other chronic pain: Secondary | ICD-10-CM | POA: Diagnosis not present

## 2018-10-27 DIAGNOSIS — I10 Essential (primary) hypertension: Secondary | ICD-10-CM | POA: Diagnosis not present

## 2018-10-27 DIAGNOSIS — I252 Old myocardial infarction: Secondary | ICD-10-CM | POA: Diagnosis not present

## 2018-10-27 DIAGNOSIS — M5442 Lumbago with sciatica, left side: Secondary | ICD-10-CM | POA: Diagnosis not present

## 2018-10-27 DIAGNOSIS — E782 Mixed hyperlipidemia: Secondary | ICD-10-CM | POA: Diagnosis not present

## 2018-12-11 DIAGNOSIS — Z20828 Contact with and (suspected) exposure to other viral communicable diseases: Secondary | ICD-10-CM | POA: Diagnosis not present

## 2018-12-11 DIAGNOSIS — Z888 Allergy status to other drugs, medicaments and biological substances status: Secondary | ICD-10-CM | POA: Diagnosis not present

## 2018-12-11 DIAGNOSIS — Z7982 Long term (current) use of aspirin: Secondary | ICD-10-CM | POA: Diagnosis not present

## 2018-12-11 DIAGNOSIS — I6502 Occlusion and stenosis of left vertebral artery: Secondary | ICD-10-CM | POA: Diagnosis not present

## 2018-12-11 DIAGNOSIS — I10 Essential (primary) hypertension: Secondary | ICD-10-CM | POA: Diagnosis not present

## 2018-12-11 DIAGNOSIS — Z79899 Other long term (current) drug therapy: Secondary | ICD-10-CM | POA: Diagnosis not present

## 2018-12-11 DIAGNOSIS — I4891 Unspecified atrial fibrillation: Secondary | ICD-10-CM | POA: Diagnosis not present

## 2018-12-11 DIAGNOSIS — E871 Hypo-osmolality and hyponatremia: Secondary | ICD-10-CM | POA: Diagnosis not present

## 2018-12-11 DIAGNOSIS — J449 Chronic obstructive pulmonary disease, unspecified: Secondary | ICD-10-CM | POA: Diagnosis not present

## 2018-12-11 DIAGNOSIS — R519 Headache, unspecified: Secondary | ICD-10-CM | POA: Diagnosis not present

## 2018-12-11 DIAGNOSIS — I6522 Occlusion and stenosis of left carotid artery: Secondary | ICD-10-CM | POA: Diagnosis not present

## 2018-12-11 DIAGNOSIS — I2510 Atherosclerotic heart disease of native coronary artery without angina pectoris: Secondary | ICD-10-CM | POA: Diagnosis not present

## 2018-12-11 DIAGNOSIS — Z886 Allergy status to analgesic agent status: Secondary | ICD-10-CM | POA: Diagnosis not present

## 2018-12-11 DIAGNOSIS — R4182 Altered mental status, unspecified: Secondary | ICD-10-CM | POA: Diagnosis not present

## 2018-12-11 DIAGNOSIS — Z8673 Personal history of transient ischemic attack (TIA), and cerebral infarction without residual deficits: Secondary | ICD-10-CM | POA: Diagnosis not present

## 2018-12-14 DIAGNOSIS — E871 Hypo-osmolality and hyponatremia: Secondary | ICD-10-CM | POA: Diagnosis not present

## 2018-12-14 DIAGNOSIS — Z8774 Personal history of (corrected) congenital malformations of heart and circulatory system: Secondary | ICD-10-CM | POA: Diagnosis not present

## 2018-12-14 DIAGNOSIS — G40909 Epilepsy, unspecified, not intractable, without status epilepticus: Secondary | ICD-10-CM | POA: Diagnosis not present

## 2018-12-14 DIAGNOSIS — I10 Essential (primary) hypertension: Secondary | ICD-10-CM | POA: Diagnosis not present

## 2018-12-14 DIAGNOSIS — Z8679 Personal history of other diseases of the circulatory system: Secondary | ICD-10-CM | POA: Insufficient documentation

## 2018-12-14 DIAGNOSIS — G44229 Chronic tension-type headache, not intractable: Secondary | ICD-10-CM | POA: Diagnosis not present

## 2018-12-26 DIAGNOSIS — R69 Illness, unspecified: Secondary | ICD-10-CM | POA: Diagnosis not present

## 2019-01-03 DIAGNOSIS — G40909 Epilepsy, unspecified, not intractable, without status epilepticus: Secondary | ICD-10-CM | POA: Diagnosis not present

## 2019-01-03 DIAGNOSIS — I10 Essential (primary) hypertension: Secondary | ICD-10-CM | POA: Diagnosis not present

## 2019-01-03 DIAGNOSIS — Z8679 Personal history of other diseases of the circulatory system: Secondary | ICD-10-CM | POA: Diagnosis not present

## 2019-01-03 DIAGNOSIS — R413 Other amnesia: Secondary | ICD-10-CM | POA: Diagnosis not present

## 2019-01-10 DIAGNOSIS — R69 Illness, unspecified: Secondary | ICD-10-CM | POA: Diagnosis not present

## 2019-01-23 DIAGNOSIS — H5213 Myopia, bilateral: Secondary | ICD-10-CM | POA: Diagnosis not present

## 2019-01-23 DIAGNOSIS — H52229 Regular astigmatism, unspecified eye: Secondary | ICD-10-CM | POA: Diagnosis not present

## 2019-01-23 DIAGNOSIS — H5210 Myopia, unspecified eye: Secondary | ICD-10-CM | POA: Diagnosis not present

## 2019-01-23 DIAGNOSIS — H524 Presbyopia: Secondary | ICD-10-CM | POA: Diagnosis not present

## 2019-01-23 DIAGNOSIS — H5203 Hypermetropia, bilateral: Secondary | ICD-10-CM | POA: Diagnosis not present

## 2019-01-26 DIAGNOSIS — R69 Illness, unspecified: Secondary | ICD-10-CM | POA: Diagnosis not present

## 2019-01-30 DIAGNOSIS — G40909 Epilepsy, unspecified, not intractable, without status epilepticus: Secondary | ICD-10-CM | POA: Diagnosis not present

## 2019-01-30 DIAGNOSIS — Z8679 Personal history of other diseases of the circulatory system: Secondary | ICD-10-CM | POA: Diagnosis not present

## 2019-02-28 DIAGNOSIS — I252 Old myocardial infarction: Secondary | ICD-10-CM | POA: Diagnosis not present

## 2019-02-28 DIAGNOSIS — Z888 Allergy status to other drugs, medicaments and biological substances status: Secondary | ICD-10-CM | POA: Diagnosis not present

## 2019-02-28 DIAGNOSIS — D3501 Benign neoplasm of right adrenal gland: Secondary | ICD-10-CM | POA: Diagnosis not present

## 2019-02-28 DIAGNOSIS — N132 Hydronephrosis with renal and ureteral calculous obstruction: Secondary | ICD-10-CM | POA: Diagnosis not present

## 2019-02-28 DIAGNOSIS — J449 Chronic obstructive pulmonary disease, unspecified: Secondary | ICD-10-CM | POA: Diagnosis not present

## 2019-02-28 DIAGNOSIS — Z7982 Long term (current) use of aspirin: Secondary | ICD-10-CM | POA: Diagnosis not present

## 2019-02-28 DIAGNOSIS — Z7951 Long term (current) use of inhaled steroids: Secondary | ICD-10-CM | POA: Diagnosis not present

## 2019-02-28 DIAGNOSIS — R1032 Left lower quadrant pain: Secondary | ICD-10-CM | POA: Diagnosis not present

## 2019-02-28 DIAGNOSIS — I2510 Atherosclerotic heart disease of native coronary artery without angina pectoris: Secondary | ICD-10-CM | POA: Diagnosis not present

## 2019-02-28 DIAGNOSIS — Z8673 Personal history of transient ischemic attack (TIA), and cerebral infarction without residual deficits: Secondary | ICD-10-CM | POA: Diagnosis not present

## 2019-02-28 DIAGNOSIS — Z87891 Personal history of nicotine dependence: Secondary | ICD-10-CM | POA: Diagnosis not present

## 2019-02-28 DIAGNOSIS — R109 Unspecified abdominal pain: Secondary | ICD-10-CM | POA: Diagnosis not present

## 2019-02-28 DIAGNOSIS — I10 Essential (primary) hypertension: Secondary | ICD-10-CM | POA: Diagnosis not present

## 2019-03-02 DIAGNOSIS — N200 Calculus of kidney: Secondary | ICD-10-CM | POA: Diagnosis not present

## 2019-03-02 DIAGNOSIS — R300 Dysuria: Secondary | ICD-10-CM | POA: Diagnosis not present

## 2019-03-16 DIAGNOSIS — N289 Disorder of kidney and ureter, unspecified: Secondary | ICD-10-CM | POA: Diagnosis not present

## 2019-03-16 DIAGNOSIS — Z20822 Contact with and (suspected) exposure to covid-19: Secondary | ICD-10-CM | POA: Diagnosis not present

## 2019-03-16 DIAGNOSIS — I10 Essential (primary) hypertension: Secondary | ICD-10-CM | POA: Diagnosis not present

## 2019-03-16 DIAGNOSIS — Z8673 Personal history of transient ischemic attack (TIA), and cerebral infarction without residual deficits: Secondary | ICD-10-CM | POA: Diagnosis not present

## 2019-03-16 DIAGNOSIS — I2510 Atherosclerotic heart disease of native coronary artery without angina pectoris: Secondary | ICD-10-CM | POA: Diagnosis not present

## 2019-03-16 DIAGNOSIS — N200 Calculus of kidney: Secondary | ICD-10-CM | POA: Diagnosis not present

## 2019-03-16 DIAGNOSIS — Z79899 Other long term (current) drug therapy: Secondary | ICD-10-CM | POA: Diagnosis not present

## 2019-03-16 DIAGNOSIS — R110 Nausea: Secondary | ICD-10-CM | POA: Diagnosis not present

## 2019-03-16 DIAGNOSIS — I4891 Unspecified atrial fibrillation: Secondary | ICD-10-CM | POA: Diagnosis not present

## 2019-03-16 DIAGNOSIS — N23 Unspecified renal colic: Secondary | ICD-10-CM | POA: Diagnosis not present

## 2019-03-16 DIAGNOSIS — J449 Chronic obstructive pulmonary disease, unspecified: Secondary | ICD-10-CM | POA: Diagnosis not present

## 2019-03-16 DIAGNOSIS — I252 Old myocardial infarction: Secondary | ICD-10-CM | POA: Diagnosis not present

## 2019-03-16 DIAGNOSIS — R1032 Left lower quadrant pain: Secondary | ICD-10-CM | POA: Diagnosis not present

## 2019-03-21 ENCOUNTER — Other Ambulatory Visit: Payer: Self-pay | Admitting: Urology

## 2019-03-21 DIAGNOSIS — N201 Calculus of ureter: Secondary | ICD-10-CM | POA: Diagnosis not present

## 2019-03-21 DIAGNOSIS — R310 Gross hematuria: Secondary | ICD-10-CM | POA: Diagnosis not present

## 2019-03-21 DIAGNOSIS — R3912 Poor urinary stream: Secondary | ICD-10-CM | POA: Diagnosis not present

## 2019-03-23 ENCOUNTER — Other Ambulatory Visit (HOSPITAL_COMMUNITY)
Admission: RE | Admit: 2019-03-23 | Discharge: 2019-03-23 | Disposition: A | Discharge status: Home / Self Care | Payer: Medicare HMO | Source: Ambulatory Visit | Attending: Urology | Admitting: Urology

## 2019-03-23 DIAGNOSIS — Z20822 Contact with and (suspected) exposure to covid-19: Secondary | ICD-10-CM | POA: Diagnosis not present

## 2019-03-23 DIAGNOSIS — Z01812 Encounter for preprocedural laboratory examination: Secondary | ICD-10-CM | POA: Diagnosis not present

## 2019-03-23 LAB — SARS CORONAVIRUS 2 (TAT 6-24 HRS): SARS Coronavirus 2: NEGATIVE

## 2019-03-23 NOTE — Patient Instructions (Signed)
DUE TO COVID-19 ONLY ONE VISITOR IS ALLOWED TO COME WITH YOU AND STAY IN THE WAITING ROOM ONLY DURING PRE OP AND PROCEDURE DAY OF SURGERY. THE 1 VISITOR MAY VISIT WITH YOU AFTER SURGERY IN YOUR PRIVATE ROOM DURING VISITING HOURS ONLY!                Peter Barnett    Your procedure is scheduled on: 03/27/19   Report to [facility] Main  Entrance   Report to admitting at  7:15 AM     Call this number if you have problems the morning of surgery [phone]    Remember: Do not eat food or drink liquids :After Midnight.   BRUSH YOUR TEETH MORNING OF SURGERY AND RINSE YOUR MOUTH OUT, NO CHEWING GUM CANDY OR MINTS.     Take these medicines the morning of surgery with A SIP OF WATER: Oxcabazepine, Metoprolol, Tamsulosin, Pepcid. Use your inhalers and bring them to the hospital with you                                 You may not have any metal on your body including               piercings  Do not wear jewelry,  lotions, powders or  deodorant                     Men may shave face and neck.   Do not bring valuables to the hospital. [place].  Contacts, dentures or bridgework may not be worn into surgery.       Patients discharged the day of surgery will not be allowed to drive home.   IF YOU ARE HAVING SURGERY AND GOING HOME THE SAME DAY, YOU MUST HAVE AN ADULT TO DRIVE YOU HOME AND BE WITH YOU FOR 24 HOURS.  YOU MAY GO HOME BY TAXI OR UBER OR ORTHERWISE, BUT AN ADULT MUST ACCOMPANY YOU HOME AND STAY WITH YOU FOR 24 HOURS.  Name and phone number of your driver:  Special Instructions: N/A              Please read over the following fact sheets you were given: _____________________________________________________________________             [facility] - Preparing for Surgery  Before surgery, you can play an important role.   Because skin is not sterile, your skin needs to be as free of germs as possible.   You can  reduce the number of germs on your skin by washing with CHG (chlorahexidine gluconate) soap before surgery.   CHG is an antiseptic cleaner which kills germs and bonds with the skin to continue killing germs even after washing. Please DO NOT use if you have an allergy to CHG or antibacterial soaps .  If your skin becomes reddened/irritated stop using the CHG and inform your nurse when you arrive at Short Stay.   You may shave your face/neck.  Please follow these instructions carefully:  1.  Shower with CHG Soap the night before surgery and the  morning of Surgery.  2.  If you choose to wash your hair, wash your hair first as usual with your  normal  shampoo.  3.  After you shampoo, rinse your hair and body  thoroughly to remove the  shampoo.                                        4.  Use CHG as you would any other liquid soap.  You can apply chg directly  to the skin and wash                       Gently with a scrungie or clean washcloth.  5.  Apply the CHG Soap to your body ONLY FROM THE NECK DOWN.   Do not use on face/ open                           Wound or open sores. Avoid contact with eyes, ears mouth and genitals (private parts).                       Wash face,  Genitals (private parts) with your normal soap.             6.  Wash thoroughly, paying special attention to the area where your surgery  will be performed.  7.  Thoroughly rinse your body with warm water from the neck down.  8.  DO NOT shower/wash with your normal soap after using and rinsing off  the CHG Soap.             9.  Pat yourself dry with a clean towel.            10.  Wear clean pajamas.            11.  Place clean sheets on your bed the night of your first shower and do not  sleep with pets. Day of Surgery : Do not apply any lotions/deodorants the morning of surgery.  Please wear clean clothes to the hospital/surgery center.  FAILURE TO FOLLOW THESE INSTRUCTIONS MAY RESULT IN THE CANCELLATION OF YOUR  SURGERY PATIENT SIGNATURE_________________________________  NURSE SIGNATURE__________________________________  ________________________________________________________________________

## 2019-03-26 ENCOUNTER — Encounter (HOSPITAL_COMMUNITY): Payer: Self-pay

## 2019-03-26 ENCOUNTER — Other Ambulatory Visit: Payer: Self-pay

## 2019-03-26 ENCOUNTER — Encounter (HOSPITAL_COMMUNITY)
Admission: RE | Admit: 2019-03-26 | Discharge: 2019-03-26 | Disposition: A | Discharge status: Home / Self Care | Payer: Medicare HMO | Source: Ambulatory Visit | Attending: Urology | Admitting: Urology

## 2019-03-26 DIAGNOSIS — Z8673 Personal history of transient ischemic attack (TIA), and cerebral infarction without residual deficits: Secondary | ICD-10-CM | POA: Diagnosis not present

## 2019-03-26 DIAGNOSIS — K219 Gastro-esophageal reflux disease without esophagitis: Secondary | ICD-10-CM | POA: Insufficient documentation

## 2019-03-26 DIAGNOSIS — I252 Old myocardial infarction: Secondary | ICD-10-CM | POA: Insufficient documentation

## 2019-03-26 DIAGNOSIS — N35919 Unspecified urethral stricture, male, unspecified site: Secondary | ICD-10-CM | POA: Diagnosis not present

## 2019-03-26 DIAGNOSIS — Z87891 Personal history of nicotine dependence: Secondary | ICD-10-CM | POA: Insufficient documentation

## 2019-03-26 DIAGNOSIS — I4891 Unspecified atrial fibrillation: Secondary | ICD-10-CM | POA: Insufficient documentation

## 2019-03-26 DIAGNOSIS — J449 Chronic obstructive pulmonary disease, unspecified: Secondary | ICD-10-CM | POA: Insufficient documentation

## 2019-03-26 DIAGNOSIS — E785 Hyperlipidemia, unspecified: Secondary | ICD-10-CM | POA: Insufficient documentation

## 2019-03-26 DIAGNOSIS — Z01812 Encounter for preprocedural laboratory examination: Secondary | ICD-10-CM | POA: Insufficient documentation

## 2019-03-26 DIAGNOSIS — Z7982 Long term (current) use of aspirin: Secondary | ICD-10-CM | POA: Diagnosis not present

## 2019-03-26 DIAGNOSIS — Z7902 Long term (current) use of antithrombotics/antiplatelets: Secondary | ICD-10-CM | POA: Insufficient documentation

## 2019-03-26 DIAGNOSIS — Z79899 Other long term (current) drug therapy: Secondary | ICD-10-CM | POA: Insufficient documentation

## 2019-03-26 DIAGNOSIS — I2510 Atherosclerotic heart disease of native coronary artery without angina pectoris: Secondary | ICD-10-CM | POA: Diagnosis not present

## 2019-03-26 DIAGNOSIS — G8929 Other chronic pain: Secondary | ICD-10-CM | POA: Insufficient documentation

## 2019-03-26 DIAGNOSIS — Z7901 Long term (current) use of anticoagulants: Secondary | ICD-10-CM | POA: Diagnosis not present

## 2019-03-26 DIAGNOSIS — N201 Calculus of ureter: Secondary | ICD-10-CM | POA: Insufficient documentation

## 2019-03-26 LAB — CBC
HCT: 48.9 % (ref 39.0–52.0)
Hemoglobin: 16.4 g/dL (ref 13.0–17.0)
MCH: 30.3 pg (ref 26.0–34.0)
MCHC: 33.5 g/dL (ref 30.0–36.0)
MCV: 90.4 fL (ref 80.0–100.0)
Platelets: 169 10*3/uL (ref 150–400)
RBC: 5.41 MIL/uL (ref 4.22–5.81)
RDW: 13.2 % (ref 11.5–15.5)
WBC: 7.5 10*3/uL (ref 4.0–10.5)
nRBC: 0 % (ref 0.0–0.2)

## 2019-03-26 LAB — BASIC METABOLIC PANEL WITH GFR
Anion gap: 8 (ref 5–15)
BUN: 15 mg/dL (ref 6–20)
CO2: 23 mmol/L (ref 22–32)
Calcium: 9.2 mg/dL (ref 8.9–10.3)
Chloride: 108 mmol/L (ref 98–111)
Creatinine, Ser: 0.8 mg/dL (ref 0.61–1.24)
GFR calc Af Amer: >60 mL/min
GFR calc non Af Amer: >60 mL/min
Glucose, Bld: 129 mg/dL — ABNORMAL HIGH (ref 70–99)
Potassium: 4.1 mmol/L (ref 3.5–5.1)
Sodium: 139 mmol/L (ref 135–145)

## 2019-03-26 NOTE — Progress Notes (Signed)
Anesthesia Chart Review   Case: [id number] Date/Time: 03/27/19 0900   Procedures:      CYSTOSCOPY WITH URETHRAL DILATATION (N/A )     LEFT URETEROSCOPY WITH HOLMIUM LASER LITHOTRIPSY POSSIBLE STENT (Left )   Anesthesia type: General   Pre-op diagnosis: LEFT URETERAL VESSICAL JUNCTION STONE AND URETHRAL STRICTURE   Location: [facility] PROCEDURE [facility] / [facility]   Surgeons: Irine Seal, MD      DISCUSSION:59 y.o. former smoker (73 pack years, quit 02/08/01) with h/o HTN, MI 2009, CAD (DES 2009), COPD, epilepsy, left ureteral vesical junction stone and urethral stricture scheduled for above procedure 03/27/19 with Dr. Irine Seal.   Admission 5/11-5/13/2020 with new onset A-fib w/RVR.  He followed up with Dr. Mauricio Po 10/09/2018.  Per OV note, "I explained to the patient that normally we would recommend chronic anticoagulation and if that was not possible because of his history of intracranial bleed we could place a watchman device but his annual risk of stroke is only 2% annually and Medicare guidelines usually recommend 3% annually risk before proceeding with the procedure."  Eliquis was stopped and he was started on aspirin 81mg .  1 year follow up with cardiology recommended. No recurrence of sx.  VS: BP (!) 152/75   Pulse (!) 58   Temp 36.9 C (Oral)   Resp 18   Ht 5\' 11"  (1.803 m)   Wt 115.9 kg   SpO2 97%   BMI 35.64 kg/m   PROVIDERS: Corrington, Kip A, MD is PCP   Lamar Blinks, MD is Cardiologist  LABS: labs pending (all labs ordered are listed, but only abnormal results are displayed)  Labs Reviewed  BASIC METABOLIC PANEL  CBC     IMAGES:   EKG: 06/21/2018 Rate 78 bpm Atrial fibrillation Left anterior fascicular block Abnormal QRS-T angle, consider primary T wave abnormality Prolonged QT Although rate has decreased NO SIGNIFICANT CHANGE SINCE LAST TRACING YESTERDAY  CV: Echo TEE 06/21/2018 IMPRESSIONS    1. No evidence of a thrombus present in the left atrial appendage.   2. The aortic root and ascending aorta are normal in size and structure.  3. There is evidence of mild plaque in the descending aorta.  4. The patient was successfully cardioverted back to NSR following the  TEE.  5. The left ventricle has normal systolic function, with an ejection  fraction of 55-60%.   FINDINGS  Left Ventricle: The left ventricle has normal systolic function, with an  ejection fraction of 55-60%.  Stress Test 11/30/17  FINDINGS: Left ventricular ejection fraction is calculated to be 60 %.  IMPRESSION: Left ventricular ejection fraction of 60%.  TECHNIQUE: Gated imaging of the left ventricle was performed after the perfusion exam.  FINDINGS: Dynamic imaging of the left ventricle demonstrates no wall motion abnormalities.  IMPRESSION: Normal left ventricular wall motion.  Cardiac Cath 12/15/2015  The left ventricular systolic function is normal.  LV end diastolic pressure is mildly elevated.  The left ventricular ejection fraction is 55-65% by visual estimate.  Prox RCA lesion, 30 %stenosed.  Mid RCA lesion, 60 %stenosed.  Mid RCA to Dist RCA lesion, 60 %stenosed.  Dist RCA lesion, 99 %stenosed.  Ost LAD lesion, 0 %stenosed.  Mid Cx lesion, 0 %stenosed.  Ost 2nd Mrg to 2nd Mrg lesion, 40 %stenosed.  A STENT SYNERGY DES 2.25X12 drug eluting stent was successfully placed.  1st Diag lesion, 95 %stenosed.  Post intervention, there is a 0% residual stenosis.   1. 2 vessel obstructive CAD    -  95% first diagonal    - 99% distal RCA. This is just distal to prior stent and appears chronic with left to right collaterals. The vessel is very small in this area and involves with PLOM and PDA bifurcation 2. Continued patency of stents in the proximal LAD and mid LCx. 3. Normal LV function with mildly elevated LVEDP 4. Successful stenting of the first diagonal with DES.  Plan: DAPT for one year. Medical management for residual disease in RCA. If  patient has refractory angina we could attempt repeat PCI of this lesion but long term patency would be poor due to vessel size and diffuse disease.   Myocardial Perfusion 10/12/2011 Impression:  The post stress left ventricle is normal in size.  The post-stress left ventricular function is normal.  The post-stress ejection fraction is 61% Unremarkable pharmacological stress test  This was essentially a low risk scan.  Normal myocardial perfusion scan demonstrating an attenuation artifact in the inferior region of the myocardium.  No ischemia or infarct/scar is seen in the remaining myocardium.  Past Medical History:  Diagnosis Date  . CAD (coronary artery disease)    a. MI/DES to Cx 2009 with staged PCI of LAD with DES and distal RCA with BMS. b. NSTEMI 12/2015 s/p DES to D1, known occlusion of dRCA tx medically.  . Chronic lower back pain    Still with recurrent left sided LBP with paresthesias down left leg --primarily with activities: pain pill use is avg <90 tabs per mo  . COPD (chronic obstructive pulmonary disease) ([place])    NOTED on CT chest 11/2013 done for lung ca screening.  Hosp for acute exac 11/2014.  Spirometry not supportive of this dx as of pulm eval 2017, though.  Spireva trial by Dr. Elsworth Soho 02/2015.  Marland Kitchen GERD (gastroesophageal reflux disease)   . Heart attack ([place]) 12/12/2015  . History of adenomatous polyp of colon 02/2009;02/2014   Recall 5 yrs ([facility])  . History of kidney stones   . HTN (hypertension)   . Hyperlipidemia   . Intracranial hemorrhage ([place]) 12/27/14   small acute hemorrhage in gliotic medial R frontal lobe  . Memory loss   . MI (myocardial infarction) ([place]) 2009   "just had arm pain; never any chest pain"  . Migraine syndrome 10/07/2011    a couple per month usually, but worsening 2017 so Dr. Krista Blue adjusted med  . 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 Seizures ([place])    last one was fall of 2016.  Complex partial sz d/o.  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 Krista Blue in neurology doing w/u.  Marland Kitchen Stroke ([place]) 12/2014  . Tobacco abuse   . Urethral stricture since 1981   Recurrent dilation has been required [facility] urology)    Past Surgical History:  Procedure Laterality Date  . BACK SURGERY  11/25/2015  . CARDIAC CATHETERIZATION  2011   [place], records unavailable: pt reports "normal".  . CARDIAC CATHETERIZATION N/A 12/15/2015   Procedure: Left Heart Cath and Coronary Angiography;  Surgeon: Peter M Martinique, MD;  Location: [place] CV LAB;  Service: Cardiovascular;  Laterality: N/A;  . CARDIAC CATHETERIZATION N/A 12/15/2015   Procedure: Coronary Stent Intervention;  Surgeon: Peter M Martinique, MD;  Location: [place] CV LAB;  Service: Cardiovascular;  Laterality: N/A;  . CARDIOVERSION N/A 06/21/2018   Procedure: CARDIOVERSION;  Surgeon: [name], MD;  Location: [place];  Service: Cardiovascular;  Laterality:  N/A;  . COLONOSCOPY W/ POLYPECTOMY  02/2009;02/2014   Recall 5 yr (aden poly, hyperplast polyp, int and ext hemorr).  Next recall is 02/2019.  Marland Kitchen CORONARY ANGIOPLASTY WITH STENT PLACEMENT  2009   3 DES to LCx; still with known distal RCA/PDA occlusion.  EF 60% by LV-gram.  . CRANIOTOMY FOR AVM  1981   Dx'd after pt began having seizures.  . CT ANGIOGRAM (CEREBRAL)  12/31/14   NORMAL--plan per neuro is to repeat this in 3 mo.  Pt states it was repeated Spring 2017 and was "fine"  . [place] SURGERY  2008; 2009   Dr. Joya Salm  . LUMBAR LAMINECTOMY/DECOMPRESSION MICRODISCECTOMY N/A 11/25/2015   Procedure: Lumbar three- four Lumbar four- five Laminectomy/Foraminotomy;  Surgeon: Leeroy Cha, MD;  Location: [place];  Service: Neurosurgery;  Laterality: N/A;  L3-4 L4-5 Laminectomy/Foraminotomy/possible Lumbar Drain  . TEE WITHOUT CARDIOVERSION N/A 06/21/2018   Procedure: TRANSESOPHAGEAL ECHOCARDIOGRAM (TEE);  Surgeon: Acie Fredrickson Wonda Cheng, MD;  Location: [facility]  ENDOSCOPY;  Service: Cardiovascular;  Laterality: N/A;  . TONSILLECTOMY     "I was little"  . TRANSTHORACIC ECHOCARDIOGRAM  12/07/14   Normal LV size/fxn, EF 60%, normal valves.    MEDICATIONS: . acetaminophen (TYLENOL) 500 MG tablet  . albuterol (PROVENTIL HFA;VENTOLIN HFA) 108 (90 BASE) MCG/ACT inhaler  . apixaban (ELIQUIS) 5 MG TABS tablet  . aspirin EC 81 MG tablet  . Cholecalciferol (VITAMIN D) 50 MCG (2000 UT) tablet  . clopidogrel (PLAVIX) 75 MG tablet  . diltiazem (CARDIZEM CD) 180 MG 24 hr capsule  . docusate sodium (COLACE) 100 MG capsule  . ezetimibe (ZETIA) 10 MG tablet  . famotidine (PEPCID) 20 MG tablet  . HYDROcodone-acetaminophen (NORCO) 10-325 MG per tablet  . hydrocortisone 2.5 % ointment  . isosorbide mononitrate (IMDUR) 30 MG 24 hr tablet  . metoprolol tartrate (LOPRESSOR) 25 MG tablet  . nitroGLYCERIN (NITROSTAT) 0.4 MG SL tablet  . ondansetron (ZOFRAN-ODT) 4 MG disintegrating tablet  . oxcarbazepine (TRILEPTAL) 600 MG tablet  . rosuvastatin (CRESTOR) 40 MG tablet  . tamsulosin (FLOMAX) 0.4 MG CAPS capsule   No current facility-administered medications for this encounter.   Maia Plan [facility] Pre-Surgical Testing [phone] 03/26/19  2:25 PM

## 2019-03-26 NOTE — H&P (Signed)
I have ureteral stone.  HPI: Peter Barnett is a 59 year-old male patient who was referred by Remo Lipps A. Doyle [facility]) who is here for ureteral stone.  The problem is on the left side. He first stated noticing pain on approximately 01/09/2019. This is not his first kidney stone. He is currently having groin pain. He denies having flank pain, back pain, nausea, vomiting, fever, and chills. Pain is occuring on the left side.   Peter Barnett is a former patient with a history of urethral strictures and stones. he was last seen in 2015. He had the onset about a month ago of left flank pain. He had a CT on 1/20 that showed a 6 mm left proximal stone. He passed a stone from the right last year. he was supposed to have a cystoscopy for hematuria but that hasn't been done yet. He has not passed this stone yet. He has been to the ER twice including last Friday. He has had no fever but he has had nausea. He is voiding with a slow stream. He is on tamsulosin. His IPSS is 23 with a weak stream and straining to void.      ALLERGIES: Tramadol - seizure    MEDICATIONS: Aspirin  Metoprolol Tartrate  Acetaminophen  Albuterol Sulfate  Docusate Sodium  Famotidine  Flomax 0.4 mg capsule  Hydrocodone-Acetaminophen 10 mg-325 mg tablet  Hydrocortisone  Nitroglycerin  Oxcarbazepine  Rosuvastatin Calcium  Vitamin D3     GU PSH: None     PSH Notes: Cath Stent Placement, Brain Surgery, Spine Repair   NON-GU PSH: Back Surgery (Unspecified), 2008, 2011,2016 - 2016 Brain Surgery (Unspecified) - 1981     GU PMH: Abdominal Pain Unspec, Right flank pain - 2015, Abdominal pain, - 2015 Gross hematuria, Gross hematuria - 2015 Other microscopic hematuria, Microscopic hematuria - 2015 Urinary Tract Inf, Unspec site, Urinary tract infection - 2015 BPH w/LUTS, Benign prostatic hyperplasia with urinary obstruction - 2015 Hematospermia, Hematospermia - 2015 History of urolithiasis, History of renal calculi -  2015 Inflammatory Disease Prostate, Unspec, Prostatitis - 2015 Urethral Stricture, Unspec, Urethral stricture - 2015 Renal calculus      PMH Notes:  2008-02-27 15:10:03 - Note: Acute Myocardial Infarction  2013-04-27 09:11:47 - Note: Arteriovenous Malformation Of The Brain Stem      NON-GU PMH: Encounter for general adult medical examination without abnormal findings, Encounter for preventive health examination - 2015 Myocardial Infarction, History of myocardial infarction - 2015 Personal history of other diseases of the circulatory system, History of cardiac disorder - 2014 Personal history of other endocrine, nutritional and metabolic disease, History of hyperlipidemia - 2014 Arrhythmia Arthritis Atrial Fibrillation GERD Heart disease, unspecified Hypercholesterolemia Hypertension Seizure disorder Stroke/TIA    FAMILY HISTORY: 1 Daughter - Other 1 son - Other Death In The Family Father - Runs In Family Family Health Status Number - Runs In Family father deceased - Other Kidney Stones - Father Malignant Fibrous Mesothelioma Of The Lung - Father mother living - Other   SOCIAL HISTORY: Marital Status: Married Current Smoking Status: Patient does not smoke anymore. Has not smoked since 03/11/2001. Smoked for 27 years. Smoked 2 packs per day.   Tobacco Use Assessment Completed: Used Tobacco in last 30 days? Drinks 1 drink per day.  Drinks 3 caffeinated drinks per day.     Notes: Former smoker, Occupation:, Alcohol Use, Tobacco Use, Marital History - Currently Married, Caffeine Use   REVIEW OF SYSTEMS:    GU Review Male:   Patient  reports burning/ pain with urination, get up at night to urinate, stream starts and stops, and trouble starting your stream. Patient denies frequent urination, hard to postpone urination, leakage of urine, have to strain to urinate , erection problems, and penile pain.  Gastrointestinal (Upper):   Patient denies nausea, vomiting, and indigestion/  heartburn.  Gastrointestinal (Lower):   Patient denies diarrhea and constipation.  Constitutional:   Patient denies fever, night sweats, weight loss, and fatigue.  Skin:   Patient denies itching and skin rash/ lesion.  Eyes:   Patient denies blurred vision and double vision.  Ears/ Nose/ Throat:   Patient denies sore throat and sinus problems.  Hematologic/Lymphatic:   Patient denies swollen glands and easy bruising.  Cardiovascular:   Patient reports leg swelling. Patient denies chest pains.  Respiratory:   Patient reports shortness of breath. Patient denies cough.  Endocrine:   Patient denies excessive thirst.  Musculoskeletal:   Patient reports back pain and joint pain.   Neurological:   Patient reports headaches. Patient denies dizziness.  Psychologic:   Patient denies depression and anxiety.   Notes: blood in urine, Weak stream    VITAL SIGNS:      03/21/2019 10:10 AM  Weight 250 lb / 113.4 kg  Height 71 in / 180.34 cm  BP 137/82 mmHg  Pulse 56 /min  Temperature 97.8 F / 36.5 C  BMI 34.9 kg/m   MULTI-SYSTEM PHYSICAL EXAMINATION:    Constitutional: Well-nourished. No physical deformities. Normally developed. Good grooming.  Neck: Neck symmetrical, not swollen. Normal tracheal position.  Respiratory: Normal breath sounds. No labored breathing, no use of accessory muscles.   Cardiovascular: Regular rate and rhythm. No murmur, no gallop.   Skin: No paleness, no jaundice, no cyanosis. No lesion, no ulcer, no rash.  Neurologic / Psychiatric: Oriented to time, oriented to place, oriented to person. No depression, no anxiety, no agitation.  Gastrointestinal: Obese abdomen. No mass, no tenderness, no rigidity.   Musculoskeletal: Normal gait and station of head and neck.     PAST DATA REVIEWED:  Source Of History:  Patient  Records Review:   Previous Patient Records  Urine Test Review:   Urinalysis  X-Ray Review: KUB: Reviewed Films. Discussed With Patient.  C.T. Stone Protocol:  Reviewed Films. Reviewed Report. Discussed With Patient.     PROCEDURES:         KUB - [id number]  A single view of the abdomen is obtained. there is a 28mm calcification in the area of the left UVJ that is consistent with the stone seen on CT. No bone, gas or soft tissue abnormalities are noted.       Patient confirmed No Neulasta OnPro Device.            Urinalysis w/Scope Dipstick Dipstick Cont'd Micro  Color: Yellow Bilirubin: Neg mg/dL WBC/hpf: NS (Not Seen)  Appearance: Clear Ketones: Neg mg/dL RBC/hpf: 3 - 10/hpf  Specific Gravity: 1.020 Blood: Trace ery/uL Bacteria: NS (Not Seen)  pH: 7.5 Protein: Trace mg/dL Cystals: NS (Not Seen)  Glucose: Neg mg/dL Urobilinogen: 0.2 mg/dL Casts: NS (Not Seen)    Nitrites: Neg Trichomonas: Not Present    Leukocyte Esterase: Neg leu/uL Mucous: Not Present      Epithelial Cells: NS (Not Seen)      Yeast: NS (Not Seen)      Sperm: Not Present    Notes: microscopic not concentrated    ASSESSMENT:      ICD-10 Details  1 GU:   Ureteral  calculus - N20.1 Left, Acute, Systemic Symptoms - He has a 66mm stone that is now at the left UVJ. He would like to proceed with therapy so I will get him set up for URS since he needs cystoscopy for the history of strictures and hematuria. I have reviewed the risks of ureteroscopy including bleeding, infection, ureteral injury, need for a stent or secondary procedures, thrombotic events and anesthetic complications.   2   Gross hematuria - R31.0 Chronic, Stable - CT shows no cause other than the stone.   3   Urethral Stricture, Unspec - N35.9 Chronic, Exacerbation - He has had some progressive weakening of the stream. I will get him set up for urethral dilation at the time of ureteroscopy. Risks reviewed.   4   Weak Urinary Stream - R39.12 Chronic, Exacerbation   PLAN:           Orders X-Rays: KUB          Schedule Return Visit/Planned Activity: ASAP - Schedule Surgery             Note: Pam has the  greensheet.  Procedure: Approximately 6 Days - Cysto Uretero Lithotripsy - [phone]

## 2019-03-26 NOTE — Progress Notes (Signed)
PCP - Dr/ K. Corrington Cardiologist - Dr. Tally Due  Chest x-ray - 06/20/19 EKG - 06/21/19 Stress Test -  ECHO - 06/21/19 Cardiac Cath - 2017  Sleep Study - no CPAP -   Fasting Blood Sugar - NA Checks Blood Sugar _____ times a day  Blood Thinner Instructions:ASA Aspirin Instructions:stop 5 days prior to surgery Last Dose:  Anesthesia review:   Patient denies shortness of breath, fever, cough and chest pain at PAT appointment yes  Patient verbalized understanding of instructions that were given to them at the PAT appointment. Patient was also instructed that they will need to review over the PAT instructions again at home before surgery. Yes Pt passed stone and will bring it with him DOS. Call placed to [facility] (Dr. Ralene Muskrat nurse) because pt wasn't sure if he needed surgery. We proceeded with the PAT appt and blood work. Pt was instructed to come in for his surgery as planned.

## 2019-03-27 ENCOUNTER — Encounter (HOSPITAL_COMMUNITY): Payer: Self-pay | Admitting: Urology

## 2019-03-27 ENCOUNTER — Encounter (HOSPITAL_COMMUNITY)
Admission: RE | Disposition: A | Discharge status: Home / Self Care | Payer: Self-pay | Source: Ambulatory Visit | Attending: Urology

## 2019-03-27 ENCOUNTER — Ambulatory Visit (HOSPITAL_COMMUNITY)
Admission: RE | Admit: 2019-03-27 | Discharge: 2019-03-27 | Disposition: A | Discharge status: Home / Self Care | Payer: Medicare HMO | Source: Ambulatory Visit | Attending: Urology | Admitting: Urology

## 2019-03-27 ENCOUNTER — Ambulatory Visit (HOSPITAL_COMMUNITY): Payer: Medicare HMO | Admitting: Certified Registered Nurse Anesthetist

## 2019-03-27 ENCOUNTER — Ambulatory Visit (HOSPITAL_COMMUNITY): Payer: Medicare HMO | Admitting: Physician Assistant

## 2019-03-27 ENCOUNTER — Ambulatory Visit (HOSPITAL_COMMUNITY): Payer: Medicare HMO

## 2019-03-27 DIAGNOSIS — E785 Hyperlipidemia, unspecified: Secondary | ICD-10-CM | POA: Insufficient documentation

## 2019-03-27 DIAGNOSIS — I2510 Atherosclerotic heart disease of native coronary artery without angina pectoris: Secondary | ICD-10-CM | POA: Diagnosis not present

## 2019-03-27 DIAGNOSIS — I4891 Unspecified atrial fibrillation: Secondary | ICD-10-CM | POA: Insufficient documentation

## 2019-03-27 DIAGNOSIS — R0609 Other forms of dyspnea: Secondary | ICD-10-CM | POA: Diagnosis not present

## 2019-03-27 DIAGNOSIS — J449 Chronic obstructive pulmonary disease, unspecified: Secondary | ICD-10-CM | POA: Insufficient documentation

## 2019-03-27 DIAGNOSIS — G8929 Other chronic pain: Secondary | ICD-10-CM | POA: Diagnosis not present

## 2019-03-27 DIAGNOSIS — N135 Crossing vessel and stricture of ureter without hydronephrosis: Secondary | ICD-10-CM | POA: Diagnosis not present

## 2019-03-27 DIAGNOSIS — Z6835 Body mass index (BMI) 35.0-35.9, adult: Secondary | ICD-10-CM | POA: Diagnosis not present

## 2019-03-27 DIAGNOSIS — E669 Obesity, unspecified: Secondary | ICD-10-CM | POA: Insufficient documentation

## 2019-03-27 DIAGNOSIS — N201 Calculus of ureter: Secondary | ICD-10-CM | POA: Diagnosis not present

## 2019-03-27 DIAGNOSIS — E7800 Pure hypercholesterolemia, unspecified: Secondary | ICD-10-CM | POA: Insufficient documentation

## 2019-03-27 DIAGNOSIS — Z888 Allergy status to other drugs, medicaments and biological substances status: Secondary | ICD-10-CM | POA: Insufficient documentation

## 2019-03-27 DIAGNOSIS — Z79899 Other long term (current) drug therapy: Secondary | ICD-10-CM | POA: Insufficient documentation

## 2019-03-27 DIAGNOSIS — I252 Old myocardial infarction: Secondary | ICD-10-CM | POA: Diagnosis not present

## 2019-03-27 DIAGNOSIS — R519 Headache, unspecified: Secondary | ICD-10-CM | POA: Insufficient documentation

## 2019-03-27 DIAGNOSIS — M48061 Spinal stenosis, lumbar region without neurogenic claudication: Secondary | ICD-10-CM | POA: Diagnosis not present

## 2019-03-27 DIAGNOSIS — Z885 Allergy status to narcotic agent status: Secondary | ICD-10-CM | POA: Insufficient documentation

## 2019-03-27 DIAGNOSIS — R310 Gross hematuria: Secondary | ICD-10-CM | POA: Diagnosis not present

## 2019-03-27 DIAGNOSIS — N35812 Other urethral bulbous stricture, male: Secondary | ICD-10-CM | POA: Insufficient documentation

## 2019-03-27 DIAGNOSIS — Z955 Presence of coronary angioplasty implant and graft: Secondary | ICD-10-CM | POA: Diagnosis not present

## 2019-03-27 DIAGNOSIS — M5430 Sciatica, unspecified side: Secondary | ICD-10-CM | POA: Diagnosis not present

## 2019-03-27 DIAGNOSIS — Z841 Family history of disorders of kidney and ureter: Secondary | ICD-10-CM | POA: Insufficient documentation

## 2019-03-27 DIAGNOSIS — N35912 Unspecified bulbous urethral stricture, male: Secondary | ICD-10-CM | POA: Diagnosis present

## 2019-03-27 DIAGNOSIS — Z801 Family history of malignant neoplasm of trachea, bronchus and lung: Secondary | ICD-10-CM | POA: Insufficient documentation

## 2019-03-27 DIAGNOSIS — Z87891 Personal history of nicotine dependence: Secondary | ICD-10-CM | POA: Diagnosis not present

## 2019-03-27 DIAGNOSIS — M1990 Unspecified osteoarthritis, unspecified site: Secondary | ICD-10-CM | POA: Insufficient documentation

## 2019-03-27 DIAGNOSIS — K219 Gastro-esophageal reflux disease without esophagitis: Secondary | ICD-10-CM | POA: Diagnosis not present

## 2019-03-27 DIAGNOSIS — Z8673 Personal history of transient ischemic attack (TIA), and cerebral infarction without residual deficits: Secondary | ICD-10-CM | POA: Insufficient documentation

## 2019-03-27 DIAGNOSIS — Z7982 Long term (current) use of aspirin: Secondary | ICD-10-CM | POA: Insufficient documentation

## 2019-03-27 DIAGNOSIS — G40909 Epilepsy, unspecified, not intractable, without status epilepticus: Secondary | ICD-10-CM | POA: Insufficient documentation

## 2019-03-27 DIAGNOSIS — I10 Essential (primary) hypertension: Secondary | ICD-10-CM | POA: Diagnosis not present

## 2019-03-27 DIAGNOSIS — Z87442 Personal history of urinary calculi: Secondary | ICD-10-CM | POA: Insufficient documentation

## 2019-03-27 HISTORY — PX: URETEROSCOPY WITH HOLMIUM LASER LITHOTRIPSY: SHX6645

## 2019-03-27 HISTORY — PX: CYSTOSCOPY WITH URETHRAL DILATATION: SHX5125

## 2019-03-27 SURGERY — CYSTOSCOPY, WITH URETHRAL DILATION
Anesthesia: General

## 2019-03-27 MED ORDER — MIDAZOLAM HCL 5 MG/5ML IJ SOLN
INTRAMUSCULAR | Status: DC | PRN
  Administered 2019-03-27: 2 mg via INTRAVENOUS

## 2019-03-27 MED ORDER — CEFAZOLIN SODIUM-DEXTROSE 2-4 GM/100ML-% IV SOLN
2.0000 g | INTRAVENOUS | Status: AC
  Administered 2019-03-27: 2 g via INTRAVENOUS
  Filled 2019-03-27: qty 100

## 2019-03-27 MED ORDER — LIDOCAINE 2% (20 MG/ML) 5 ML SYRINGE
INTRAMUSCULAR | Status: AC
  Filled 2019-03-27: qty 5

## 2019-03-27 MED ORDER — SODIUM CHLORIDE 0.9% FLUSH: 3.0000 mL | Freq: Two times a day (BID) | INTRAVENOUS | Status: DC

## 2019-03-27 MED ORDER — DEXAMETHASONE SODIUM PHOSPHATE 10 MG/ML IJ SOLN
INTRAMUSCULAR | Status: AC
  Filled 2019-03-27: qty 1

## 2019-03-27 MED ORDER — PROPOFOL 10 MG/ML IV BOLUS
INTRAVENOUS | Status: AC
  Filled 2019-03-27: qty 20

## 2019-03-27 MED ORDER — DEXAMETHASONE SODIUM PHOSPHATE 10 MG/ML IJ SOLN: 8.0000 mg | Freq: Once | INTRAMUSCULAR | Status: DC | PRN

## 2019-03-27 MED ORDER — EPHEDRINE 5 MG/ML INJ
INTRAVENOUS | Status: AC
  Filled 2019-03-27: qty 10

## 2019-03-27 MED ORDER — SODIUM CHLORIDE 0.9 % IR SOLN
Status: DC | PRN
  Administered 2019-03-27: 3000 mL via INTRAVESICAL

## 2019-03-27 MED ORDER — LACTATED RINGERS IV SOLN: INTRAVENOUS | Status: DC

## 2019-03-27 MED ORDER — ONDANSETRON HCL 4 MG/2ML IJ SOLN
INTRAMUSCULAR | Status: DC | PRN
  Administered 2019-03-27: 4 mg via INTRAVENOUS

## 2019-03-27 MED ORDER — DEXAMETHASONE SODIUM PHOSPHATE 10 MG/ML IJ SOLN
INTRAMUSCULAR | Status: DC | PRN
  Administered 2019-03-27: 10 mg via INTRAVENOUS

## 2019-03-27 MED ORDER — FENTANYL CITRATE (PF) 100 MCG/2ML IJ SOLN: 25.0000 ug | INTRAMUSCULAR | Status: DC | PRN

## 2019-03-27 MED ORDER — MIDAZOLAM HCL 2 MG/2ML IJ SOLN
INTRAMUSCULAR | Status: AC
  Filled 2019-03-27: qty 2

## 2019-03-27 MED ORDER — IOHEXOL 300 MG/ML  SOLN
INTRAMUSCULAR | Status: DC | PRN
  Administered 2019-03-27: 5 mL

## 2019-03-27 MED ORDER — 0.9 % SODIUM CHLORIDE (POUR BTL) OPTIME
TOPICAL | Status: DC | PRN
  Administered 2019-03-27: 1000 mL

## 2019-03-27 MED ORDER — EPHEDRINE SULFATE-NACL 50-0.9 MG/10ML-% IV SOSY
PREFILLED_SYRINGE | INTRAVENOUS | Status: DC | PRN
  Administered 2019-03-27: 10 mg via INTRAVENOUS

## 2019-03-27 MED ORDER — LACTATED RINGERS IV SOLN: INTRAVENOUS | Status: DC | PRN

## 2019-03-27 MED ORDER — ONDANSETRON HCL 4 MG/2ML IJ SOLN
INTRAMUSCULAR | Status: AC
  Filled 2019-03-27: qty 2

## 2019-03-27 MED ORDER — FENTANYL CITRATE (PF) 100 MCG/2ML IJ SOLN
INTRAMUSCULAR | Status: DC | PRN
  Administered 2019-03-27 (×2): 50 ug via INTRAVENOUS

## 2019-03-27 MED ORDER — FENTANYL CITRATE (PF) 100 MCG/2ML IJ SOLN
INTRAMUSCULAR | Status: AC
  Filled 2019-03-27: qty 2

## 2019-03-27 SURGICAL SUPPLY — 26 items
BAG URO CATCHER STRL LF (MISCELLANEOUS) ×3 IMPLANT
BALLN NEPHROSTOMY (BALLOONS) ×3
BALLOON NEPHROSTOMY (BALLOONS) ×2 IMPLANT
BASKET STONE NCOMPASS (UROLOGICAL SUPPLIES) IMPLANT
CATH ROBINSON RED A/P 16FR (CATHETERS) IMPLANT
CATH URET 5FR 28IN OPEN ENDED (CATHETERS) IMPLANT
CATH URET DUAL LUMEN 6-10FR 50 (CATHETERS) ×3 IMPLANT
CLOTH BEACON ORANGE TIMEOUT ST (SAFETY) ×3 IMPLANT
EXTRACTOR STONE NITINOL NGAGE (UROLOGICAL SUPPLIES) ×3 IMPLANT
FIBER LASER FLEXIVA 1000 (UROLOGICAL SUPPLIES) IMPLANT
FIBER LASER FLEXIVA 365 (UROLOGICAL SUPPLIES) IMPLANT
FIBER LASER FLEXIVA 550 (UROLOGICAL SUPPLIES) IMPLANT
FIBER LASER TRAC TIP (UROLOGICAL SUPPLIES) IMPLANT
GLOVE SURG SS PI 8.0 STRL IVOR (GLOVE) IMPLANT
GOWN STRL REUS W/TWL XL LVL3 (GOWN DISPOSABLE) ×3 IMPLANT
GUIDEWIRE STR DUAL SENSOR (WIRE) ×3 IMPLANT
IV NS 1000ML (IV SOLUTION) ×1
IV NS 1000ML BAXH (IV SOLUTION) ×2 IMPLANT
IV NS IRRIG 3000ML ARTHROMATIC (IV SOLUTION) ×3 IMPLANT
KIT TURNOVER KIT A (KITS) IMPLANT
MANIFOLD NEPTUNE II (INSTRUMENTS) ×3 IMPLANT
PACK CYSTO (CUSTOM PROCEDURE TRAY) ×3 IMPLANT
PENCIL SMOKE EVACUATOR (MISCELLANEOUS) IMPLANT
SHEATH URETERAL 12FRX35CM (MISCELLANEOUS) ×3 IMPLANT
TUBING CONNECTING 10 (TUBING) ×3 IMPLANT
TUBING UROLOGY SET (TUBING) ×3 IMPLANT

## 2019-03-27 NOTE — Addendum Note (Signed)
Addendum  created 03/27/19 1100 by Mitzie Na, CRNA   Flowsheet accepted, Intraprocedure Flowsheets edited

## 2019-03-27 NOTE — Discharge Instructions (Signed)

## 2019-03-27 NOTE — Transfer of Care (Signed)
Immediate Anesthesia Transfer of Care Note  Patient: Kathyrn Drown Aldaz  Procedure(s) Performed: CYSTOSCOPY WITH URETHRAL DILATATION (N/A ) cysto with dilation of uretheral sticture, ureteroscopy and left retrograde pyelogram. (Left )  Patient Location: PACU  Anesthesia Type:General  Level of Consciousness: awake, alert , oriented and patient cooperative  Airway & Oxygen Therapy: Patient Spontanous Breathing and Patient connected to face mask oxygen  Post-op Assessment: Report given to RN, Post -op Vital signs reviewed and stable and Patient moving all extremities  Post vital signs: Reviewed and stable  Last Vitals:  Vitals Value Taken Time  BP 130/85 03/27/19 0903  Temp    Pulse 69 03/27/19 0905  Resp 14 03/27/19 0905  SpO2 97 % 03/27/19 0905  Vitals shown include unvalidated device data.  Last Pain:  Vitals:   03/27/19 0744  TempSrc:   PainSc: 0-No pain         Complications: No apparent anesthesia complications

## 2019-03-27 NOTE — Anesthesia Procedure Notes (Signed)
Procedure Name: LMA Insertion Date/Time: 03/27/2019 8:38 AM Performed by: Mitzie Na, CRNA Pre-anesthesia Checklist: Patient identified, Emergency Drugs available, Suction available and Patient being monitored Patient Re-evaluated:Patient Re-evaluated prior to induction Oxygen Delivery Method: Circle system utilized Preoxygenation: Pre-oxygenation with 100% oxygen Induction Type: IV induction LMA: LMA inserted LMA Size: 5.0 Number of attempts: 1 Placement Confirmation: positive ETCO2 and breath sounds checked- equal and bilateral Tube secured with: Tape Dental Injury: Teeth and Oropharynx as per pre-operative assessment

## 2019-03-27 NOTE — Op Note (Signed)
Procedure: 1.  Cystoscopy with balloon dilation of bulbar urethral stricture. 2.  Left retrograde pyelogram with interpretation. 3.  Application of fluoroscopy.  Preop diagnosis: History of left distal ureteral stone, hematuria and urethral strictures.  Postop diagnosis: Bulbar urethral stricture with interval passage of a left distal ureteral stone.  Surgeon: Dr. Irine Seal.  Anesthesia: General.  Specimen: None.  Drains: None.  EBL: None.  Complications: None.  Indications: Peter Barnett is a 59 year old male who originally presented with a 6 mm left distal ureteral stone.  He also had a history of prior hematuria and urethral stricture disease with a weakening stream.  When he presented to the holding area today he reported that he had passed the stone and brought the stone to me for my review.  However with his history of urethral stricture disease and hematuria it was still felt that proceeding with cystoscopy was indicated.  Procedure: He was given 2 g of Ancef.  A general anesthetic was induced.  He was placed in lithotomy position and fitted with PAS hose.  His perineum and genitalia were prepped with Betadine solution he was draped in usual sterile fashion.  Cystoscopy was performed 41 [place] scope and 30 degree lens.  Examination revealed a normal anterior urethra there was a moderate stricture in the proximal bulb that was approximately 12 [place] in diameter.  A sensor wire was passed through the stricture into the bladder and fluoroscopic guidance.  A 24 French by 15 cm high-pressure balloon was passed over the wire across the area of stricture and inflated to 25 atm.  The balloon was then deflated and removed.  The cystoscope was reinserted and examination revealed disruption of the bulbar stricture.  The external sphincter was intact.  The prostatic urethra was approximately 3 cm in length with bilobar hyperplasia and high bladder neck without significant middle lobe.  Examination of  bladder revealed mild to moderate trabeculation.  There were no mucosal lesions noted.  The right ureteral orifice was unremarkable.  Left ureteral orifice had mild edema suggestive of recent stone passage.  A 5 French opening catheter was then passed and a gentle left retrograde pyelogram was performed to ensure no residual stone fragments remained.  The left retrograde pyelogram demonstrated a normal caliber ureter with no hydronephrosis and no evidence of filling defect to suggest residual stone material.  The bladder was then partially drained and the cystoscope was removed.  He was taken down from lithotomy position, his anesthetic was reversed and he was moved recovery in stable condition.  There were no complications.

## 2019-03-27 NOTE — Interval H&P Note (Signed)
History and Physical Interval Note:  he has passed his stone but still needs cystoscopy and possible urethral dilation.     03/27/2019 8:24 AM  Peter Barnett  has presented today for surgery, with the diagnosis of LEFT URETERAL VESSICAL JUNCTION STONE AND URETHRAL STRICTURE.  The various methods of treatment have been discussed with the patient and family. After consideration of risks, benefits and other options for treatment, the patient has consented to  Procedure(s): CYSTOSCOPY WITH URETHRAL DILATATION (N/A) LEFT URETEROSCOPY WITH HOLMIUM LASER LITHOTRIPSY POSSIBLE STENT (Left) as a surgical intervention.  The patient's history has been reviewed, patient examined, no change in status, stable for surgery.  I have reviewed the patient's chart and labs.  Questions were answered to the patient's satisfaction.     Irine Seal

## 2019-03-27 NOTE — Anesthesia Preprocedure Evaluation (Addendum)
Anesthesia Evaluation  Patient identified by MRN, date of birth, ID band Patient awake    Reviewed: Allergy & Precautions, NPO status , Patient's Chart, lab work & pertinent test results  Airway Mallampati: II  TM Distance: >3 FB Neck ROM: Full    Dental no notable dental hx. (+) Poor Dentition, Chipped,    Pulmonary COPD, former smoker,    Pulmonary exam normal breath sounds clear to auscultation       Cardiovascular hypertension, Pt. on medications and Pt. on home beta blockers + CAD, + Past MI and + DOE  Normal cardiovascular exam+ dysrhythmias Atrial Fibrillation  Rhythm:Regular Rate:Normal  Cardiac Cath 12/15/2015  The left ventricular systolic function is normal.  LV end diastolic pressure is mildly elevated.  The left ventricular ejection fraction is 55-65% by visual estimate.  Prox RCA lesion, 30 %stenosed.  Mid RCA lesion, 60 %stenosed.  Mid RCA to Dist RCA lesion, 60 %stenosed.  Dist RCA lesion, 99 %stenosed.  Ost LAD lesion, 0 %stenosed.  Mid Cx lesion, 0 %stenosed.  Ost 2nd Mrg to 2nd Mrg lesion, 40 %stenosed.  A STENT SYNERGY DES 2.25X12 drug eluting stent was successfully placed.  1st Diag lesion, 95 %stenosed.  Post intervention, there is a 0% residual stenosis.   EKG 06/2018 Atrial fibrillation LAFB, prolonged QT  S/P D/C cardioversion   Neuro/Psych  Headaches, Seizures -, Well Controlled,  Petit mal seizures - last 2016  Neuromuscular disease CVA, No Residual Symptoms    GI/Hepatic Neg liver ROS, GERD  Medicated and Controlled,  Endo/Other  Hyperlipidemia Obesity  Renal/GU Left UVJ calculus- passed   Urethral stricture    Musculoskeletal Lumbar stenosis Chronic back pain with sciatica   Abdominal (+) + obese,   Peds  Hematology Off Eliquis for several months   Anesthesia Other Findings   Reproductive/Obstetrics                          Anesthesia  Physical Anesthesia Plan  ASA: III  Anesthesia Plan: General   Post-op Pain Management:    Induction: Intravenous  PONV Risk Score and Plan: 4 or greater and Midazolam, Ondansetron, Treatment may vary due to age or medical condition and Dexamethasone  Airway Management Planned: LMA  Additional Equipment:   Intra-op Plan:   Post-operative Plan: Extubation in OR  Informed Consent: I have reviewed the patients History and Physical, chart, labs and discussed the procedure including the risks, benefits and alternatives for the proposed anesthesia with the patient or authorized representative who has indicated his/her understanding and acceptance.     Dental advisory given  Plan Discussed with: CRNA and Surgeon  Anesthesia Plan Comments:         Anesthesia Quick Evaluation

## 2019-03-27 NOTE — Anesthesia Postprocedure Evaluation (Signed)
Anesthesia Post Note  Patient: Kathyrn Drown Mungo  Procedure(s) Performed: CYSTOSCOPY WITH URETHRAL DILATATION (N/A ) cysto with dilation of uretheral sticture, ureteroscopy and left retrograde pyelogram. (Left )     Patient location during evaluation: PACU Anesthesia Type: General Level of consciousness: awake and alert and oriented Pain management: pain level controlled Vital Signs Assessment: post-procedure vital signs reviewed and stable Respiratory status: spontaneous breathing, nonlabored ventilation and respiratory function stable Cardiovascular status: blood pressure returned to baseline and stable Postop Assessment: no apparent nausea or vomiting Anesthetic complications: no    Last Vitals:  Vitals:   03/27/19 0930 03/27/19 0936  BP: (!) 132/93 (!) 124/92  Pulse: 66 62  Resp: 16   Temp: (!) 36.3 C (!) 36.3 C  SpO2: 94% 97%    Last Pain:  Vitals:   03/27/19 0936  TempSrc:   PainSc: 2                  Allisson Schindel A.

## 2019-04-04 DIAGNOSIS — N35011 Post-traumatic bulbous urethral stricture: Secondary | ICD-10-CM | POA: Diagnosis not present

## 2019-04-04 DIAGNOSIS — R3912 Poor urinary stream: Secondary | ICD-10-CM | POA: Diagnosis not present

## 2019-04-04 DIAGNOSIS — N201 Calculus of ureter: Secondary | ICD-10-CM | POA: Diagnosis not present

## 2019-04-09 DIAGNOSIS — I2510 Atherosclerotic heart disease of native coronary artery without angina pectoris: Secondary | ICD-10-CM | POA: Diagnosis not present

## 2019-04-09 DIAGNOSIS — R0600 Dyspnea, unspecified: Secondary | ICD-10-CM | POA: Diagnosis not present

## 2019-04-09 DIAGNOSIS — G8929 Other chronic pain: Secondary | ICD-10-CM | POA: Diagnosis not present

## 2019-04-09 DIAGNOSIS — Z8774 Personal history of (corrected) congenital malformations of heart and circulatory system: Secondary | ICD-10-CM | POA: Diagnosis not present

## 2019-04-09 DIAGNOSIS — R69 Illness, unspecified: Secondary | ICD-10-CM | POA: Diagnosis not present

## 2019-04-09 DIAGNOSIS — G40909 Epilepsy, unspecified, not intractable, without status epilepticus: Secondary | ICD-10-CM | POA: Diagnosis not present

## 2019-04-09 DIAGNOSIS — M5442 Lumbago with sciatica, left side: Secondary | ICD-10-CM | POA: Diagnosis not present

## 2019-04-09 DIAGNOSIS — I480 Paroxysmal atrial fibrillation: Secondary | ICD-10-CM | POA: Diagnosis not present

## 2019-04-09 DIAGNOSIS — Z8679 Personal history of other diseases of the circulatory system: Secondary | ICD-10-CM | POA: Diagnosis not present

## 2019-04-24 DIAGNOSIS — K641 Second degree hemorrhoids: Secondary | ICD-10-CM | POA: Diagnosis not present

## 2019-04-24 DIAGNOSIS — Z86010 Personal history of colonic polyps: Secondary | ICD-10-CM | POA: Diagnosis not present

## 2019-04-24 DIAGNOSIS — K635 Polyp of colon: Secondary | ICD-10-CM | POA: Diagnosis not present

## 2019-05-24 DIAGNOSIS — Z8774 Personal history of (corrected) congenital malformations of heart and circulatory system: Secondary | ICD-10-CM | POA: Diagnosis not present

## 2019-05-24 DIAGNOSIS — I480 Paroxysmal atrial fibrillation: Secondary | ICD-10-CM | POA: Diagnosis not present

## 2019-05-24 DIAGNOSIS — M5442 Lumbago with sciatica, left side: Secondary | ICD-10-CM | POA: Diagnosis not present

## 2019-05-24 DIAGNOSIS — G8929 Other chronic pain: Secondary | ICD-10-CM | POA: Diagnosis not present

## 2019-05-24 DIAGNOSIS — R69 Illness, unspecified: Secondary | ICD-10-CM | POA: Diagnosis not present

## 2019-05-24 DIAGNOSIS — Z8679 Personal history of other diseases of the circulatory system: Secondary | ICD-10-CM | POA: Diagnosis not present

## 2019-05-24 DIAGNOSIS — I2510 Atherosclerotic heart disease of native coronary artery without angina pectoris: Secondary | ICD-10-CM | POA: Diagnosis not present

## 2019-05-24 DIAGNOSIS — G40909 Epilepsy, unspecified, not intractable, without status epilepticus: Secondary | ICD-10-CM | POA: Diagnosis not present

## 2019-05-24 DIAGNOSIS — I10 Essential (primary) hypertension: Secondary | ICD-10-CM | POA: Diagnosis not present

## 2019-05-24 DIAGNOSIS — E7800 Pure hypercholesterolemia, unspecified: Secondary | ICD-10-CM | POA: Diagnosis not present

## 2019-06-13 DIAGNOSIS — Z0000 Encounter for general adult medical examination without abnormal findings: Secondary | ICD-10-CM | POA: Diagnosis not present

## 2019-06-13 DIAGNOSIS — I10 Essential (primary) hypertension: Secondary | ICD-10-CM | POA: Diagnosis not present

## 2019-06-13 DIAGNOSIS — R0600 Dyspnea, unspecified: Secondary | ICD-10-CM | POA: Diagnosis not present

## 2019-08-09 DIAGNOSIS — R69 Illness, unspecified: Secondary | ICD-10-CM | POA: Diagnosis not present

## 2019-10-23 DIAGNOSIS — R69 Illness, unspecified: Secondary | ICD-10-CM | POA: Diagnosis not present

## 2019-11-15 ENCOUNTER — Ambulatory Visit: Payer: Medicare HMO | Admitting: Physician Assistant

## 2019-11-16 DIAGNOSIS — G8929 Other chronic pain: Secondary | ICD-10-CM | POA: Diagnosis not present

## 2019-11-16 DIAGNOSIS — M5442 Lumbago with sciatica, left side: Secondary | ICD-10-CM | POA: Diagnosis not present

## 2019-11-16 DIAGNOSIS — I10 Essential (primary) hypertension: Secondary | ICD-10-CM | POA: Diagnosis not present

## 2019-12-25 DIAGNOSIS — I2510 Atherosclerotic heart disease of native coronary artery without angina pectoris: Secondary | ICD-10-CM | POA: Diagnosis not present

## 2019-12-25 DIAGNOSIS — G40909 Epilepsy, unspecified, not intractable, without status epilepticus: Secondary | ICD-10-CM | POA: Diagnosis not present

## 2019-12-25 DIAGNOSIS — I252 Old myocardial infarction: Secondary | ICD-10-CM | POA: Diagnosis not present

## 2019-12-25 DIAGNOSIS — G8929 Other chronic pain: Secondary | ICD-10-CM | POA: Diagnosis not present

## 2019-12-25 DIAGNOSIS — I739 Peripheral vascular disease, unspecified: Secondary | ICD-10-CM | POA: Diagnosis not present

## 2019-12-25 DIAGNOSIS — E785 Hyperlipidemia, unspecified: Secondary | ICD-10-CM | POA: Diagnosis not present

## 2019-12-25 DIAGNOSIS — J439 Emphysema, unspecified: Secondary | ICD-10-CM | POA: Diagnosis not present

## 2019-12-25 DIAGNOSIS — K219 Gastro-esophageal reflux disease without esophagitis: Secondary | ICD-10-CM | POA: Diagnosis not present

## 2019-12-25 DIAGNOSIS — I10 Essential (primary) hypertension: Secondary | ICD-10-CM | POA: Diagnosis not present

## 2019-12-26 DIAGNOSIS — R69 Illness, unspecified: Secondary | ICD-10-CM | POA: Diagnosis not present

## 2020-01-11 ENCOUNTER — Emergency Department (HOSPITAL_COMMUNITY): Payer: Medicare HMO

## 2020-01-11 ENCOUNTER — Inpatient Hospital Stay (HOSPITAL_COMMUNITY)
Admission: EM | Admit: 2020-01-11 | Discharge: 2020-01-14 | DRG: 175 | Disposition: A | Discharge status: Home / Self Care | Payer: Medicare HMO | Attending: Family Medicine | Admitting: Family Medicine

## 2020-01-11 ENCOUNTER — Encounter (HOSPITAL_COMMUNITY): Payer: Self-pay

## 2020-01-11 ENCOUNTER — Telehealth: Payer: Self-pay | Admitting: Cardiovascular Disease

## 2020-01-11 ENCOUNTER — Other Ambulatory Visit: Payer: Self-pay

## 2020-01-11 DIAGNOSIS — Z885 Allergy status to narcotic agent status: Secondary | ICD-10-CM

## 2020-01-11 DIAGNOSIS — Z955 Presence of coronary angioplasty implant and graft: Secondary | ICD-10-CM

## 2020-01-11 DIAGNOSIS — K219 Gastro-esophageal reflux disease without esophagitis: Secondary | ICD-10-CM | POA: Diagnosis present

## 2020-01-11 DIAGNOSIS — I10 Essential (primary) hypertension: Secondary | ICD-10-CM | POA: Diagnosis present

## 2020-01-11 DIAGNOSIS — Z825 Family history of asthma and other chronic lower respiratory diseases: Secondary | ICD-10-CM

## 2020-01-11 DIAGNOSIS — M549 Dorsalgia, unspecified: Secondary | ICD-10-CM | POA: Diagnosis present

## 2020-01-11 DIAGNOSIS — Z87442 Personal history of urinary calculi: Secondary | ICD-10-CM

## 2020-01-11 DIAGNOSIS — Z888 Allergy status to other drugs, medicaments and biological substances status: Secondary | ICD-10-CM

## 2020-01-11 DIAGNOSIS — I2693 Single subsegmental pulmonary embolism without acute cor pulmonale: Secondary | ICD-10-CM | POA: Diagnosis not present

## 2020-01-11 DIAGNOSIS — G4730 Sleep apnea, unspecified: Secondary | ICD-10-CM | POA: Diagnosis present

## 2020-01-11 DIAGNOSIS — I4891 Unspecified atrial fibrillation: Secondary | ICD-10-CM | POA: Diagnosis not present

## 2020-01-11 DIAGNOSIS — I2699 Other pulmonary embolism without acute cor pulmonale: Secondary | ICD-10-CM | POA: Diagnosis not present

## 2020-01-11 DIAGNOSIS — I2510 Atherosclerotic heart disease of native coronary artery without angina pectoris: Secondary | ICD-10-CM | POA: Diagnosis present

## 2020-01-11 DIAGNOSIS — Z7982 Long term (current) use of aspirin: Secondary | ICD-10-CM

## 2020-01-11 DIAGNOSIS — R079 Chest pain, unspecified: Secondary | ICD-10-CM | POA: Diagnosis not present

## 2020-01-11 DIAGNOSIS — G8929 Other chronic pain: Secondary | ICD-10-CM | POA: Diagnosis present

## 2020-01-11 DIAGNOSIS — Z87891 Personal history of nicotine dependence: Secondary | ICD-10-CM

## 2020-01-11 DIAGNOSIS — E785 Hyperlipidemia, unspecified: Secondary | ICD-10-CM | POA: Diagnosis present

## 2020-01-11 DIAGNOSIS — R911 Solitary pulmonary nodule: Secondary | ICD-10-CM | POA: Diagnosis not present

## 2020-01-11 DIAGNOSIS — J449 Chronic obstructive pulmonary disease, unspecified: Secondary | ICD-10-CM | POA: Diagnosis present

## 2020-01-11 DIAGNOSIS — Z83438 Family history of other disorder of lipoprotein metabolism and other lipidemia: Secondary | ICD-10-CM

## 2020-01-11 DIAGNOSIS — Z20822 Contact with and (suspected) exposure to covid-19: Secondary | ICD-10-CM | POA: Diagnosis present

## 2020-01-11 DIAGNOSIS — Z79899 Other long term (current) drug therapy: Secondary | ICD-10-CM

## 2020-01-11 DIAGNOSIS — Z8673 Personal history of transient ischemic attack (TIA), and cerebral infarction without residual deficits: Secondary | ICD-10-CM

## 2020-01-11 DIAGNOSIS — R000 Tachycardia, unspecified: Secondary | ICD-10-CM | POA: Diagnosis not present

## 2020-01-11 DIAGNOSIS — R0602 Shortness of breath: Secondary | ICD-10-CM | POA: Diagnosis present

## 2020-01-11 DIAGNOSIS — E876 Hypokalemia: Secondary | ICD-10-CM | POA: Diagnosis not present

## 2020-01-11 DIAGNOSIS — I4892 Unspecified atrial flutter: Secondary | ICD-10-CM | POA: Diagnosis not present

## 2020-01-11 DIAGNOSIS — G40802 Other epilepsy, not intractable, without status epilepticus: Secondary | ICD-10-CM | POA: Diagnosis present

## 2020-01-11 DIAGNOSIS — M545 Low back pain, unspecified: Secondary | ICD-10-CM | POA: Diagnosis present

## 2020-01-11 DIAGNOSIS — Z8249 Family history of ischemic heart disease and other diseases of the circulatory system: Secondary | ICD-10-CM

## 2020-01-11 DIAGNOSIS — R569 Unspecified convulsions: Secondary | ICD-10-CM

## 2020-01-11 DIAGNOSIS — I252 Old myocardial infarction: Secondary | ICD-10-CM

## 2020-01-11 DIAGNOSIS — Z8679 Personal history of other diseases of the circulatory system: Secondary | ICD-10-CM

## 2020-01-11 DIAGNOSIS — I4819 Other persistent atrial fibrillation: Secondary | ICD-10-CM | POA: Diagnosis present

## 2020-01-11 DIAGNOSIS — I110 Hypertensive heart disease with heart failure: Secondary | ICD-10-CM | POA: Diagnosis present

## 2020-01-11 DIAGNOSIS — I5031 Acute diastolic (congestive) heart failure: Secondary | ICD-10-CM | POA: Diagnosis present

## 2020-01-11 DIAGNOSIS — Z8774 Personal history of (corrected) congenital malformations of heart and circulatory system: Secondary | ICD-10-CM

## 2020-01-11 LAB — BASIC METABOLIC PANEL
Anion gap: 15 (ref 5–15)
BUN: 12 mg/dL (ref 6–20)
CO2: 17 mmol/L — ABNORMAL LOW (ref 22–32)
Calcium: 9 mg/dL (ref 8.9–10.3)
Chloride: 107 mmol/L (ref 98–111)
Creatinine, Ser: 0.87 mg/dL (ref 0.61–1.24)
GFR, Estimated: >60 mL/min (ref >60)
Glucose, Bld: 96 mg/dL (ref 70–99)
Potassium: 3.7 mmol/L (ref 3.5–5.1)
Sodium: 139 mmol/L (ref 135–145)

## 2020-01-11 LAB — CBC
HCT: 54.3 % — ABNORMAL HIGH (ref 39.0–52.0)
Hemoglobin: 17 g/dL (ref 13.0–17.0)
MCH: 29.6 pg (ref 26.0–34.0)
MCHC: 31.3 g/dL (ref 30.0–36.0)
MCV: 94.6 fL (ref 80.0–100.0)
Platelets: 162 10*3/uL (ref 150–400)
RBC: 5.74 MIL/uL (ref 4.22–5.81)
RDW: 13.3 % (ref 11.5–15.5)
WBC: 9.2 10*3/uL (ref 4.0–10.5)
nRBC: 0 % (ref 0.0–0.2)

## 2020-01-11 LAB — TROPONIN I (HIGH SENSITIVITY)
Troponin I (High Sensitivity): 27 ng/L — ABNORMAL HIGH (ref <18)
Troponin I (High Sensitivity): 39 ng/L — ABNORMAL HIGH (ref <18)
Troponin I (High Sensitivity): 38 ng/L — ABNORMAL HIGH (ref <18)

## 2020-01-11 LAB — RESP PANEL BY RT-PCR (FLU A&B, COVID) ARPGX2
Influenza A by PCR: NEGATIVE
Influenza B by PCR: NEGATIVE
SARS Coronavirus 2 by RT PCR: NEGATIVE

## 2020-01-11 LAB — BRAIN NATRIURETIC PEPTIDE: B Natriuretic Peptide: 136 pg/mL — ABNORMAL HIGH (ref 0.0–100.0)

## 2020-01-11 MED ORDER — ASPIRIN EC 81 MG PO TBEC
81.0000 mg | DELAYED_RELEASE_TABLET | Freq: Every day | ORAL | Status: DC
  Administered 2020-01-11: 81 mg via ORAL
  Filled 2020-01-11: qty 1

## 2020-01-11 MED ORDER — ACETAMINOPHEN 650 MG RE SUPP: 650.0000 mg | Freq: Four times a day (QID) | RECTAL | Status: DC | PRN

## 2020-01-11 MED ORDER — OXCARBAZEPINE 300 MG PO TABS
600.0000 mg | ORAL_TABLET | Freq: Two times a day (BID) | ORAL | Status: DC
  Administered 2020-01-11 – 2020-01-14 (×5): 600 mg via ORAL
  Filled 2020-01-11 (×7): qty 2

## 2020-01-11 MED ORDER — HEPARIN BOLUS VIA INFUSION
6000.0000 [IU] | Freq: Once | INTRAVENOUS | Status: AC
  Administered 2020-01-11: 6000 [IU] via INTRAVENOUS
  Filled 2020-01-11: qty 6000

## 2020-01-11 MED ORDER — ROSUVASTATIN CALCIUM 20 MG PO TABS
40.0000 mg | ORAL_TABLET | ORAL | Status: DC
  Administered 2020-01-11 – 2020-01-12 (×2): 40 mg via ORAL
  Filled 2020-01-11 (×3): qty 2

## 2020-01-11 MED ORDER — POLYETHYLENE GLYCOL 3350 17 G PO PACK: 17.0000 g | PACK | Freq: Every day | ORAL | Status: DC | PRN

## 2020-01-11 MED ORDER — DILTIAZEM HCL-DEXTROSE 125-5 MG/125ML-% IV SOLN (PREMIX)
5.0000 mg/h | INTRAVENOUS | Status: DC
  Administered 2020-01-11: 5 mg/h via INTRAVENOUS
  Administered 2020-01-12 – 2020-01-13 (×2): 7.5 mg/h via INTRAVENOUS
  Filled 2020-01-11 (×3): qty 125

## 2020-01-11 MED ORDER — FUROSEMIDE 20 MG PO TABS
20.0000 mg | ORAL_TABLET | Freq: Two times a day (BID) | ORAL | Status: DC
  Administered 2020-01-12 – 2020-01-13 (×4): 20 mg via ORAL
  Filled 2020-01-11 (×4): qty 1

## 2020-01-11 MED ORDER — IOHEXOL 350 MG/ML SOLN
75.0000 mL | Freq: Once | INTRAVENOUS | Status: AC | PRN
  Administered 2020-01-11: 75 mL via INTRAVENOUS

## 2020-01-11 MED ORDER — HEPARIN (PORCINE) 25000 UT/250ML-% IV SOLN
1700.0000 [IU]/h | INTRAVENOUS | Status: DC
  Administered 2020-01-11: 1200 [IU]/h via INTRAVENOUS
  Filled 2020-01-11 (×2): qty 250

## 2020-01-11 MED ORDER — SODIUM CHLORIDE 0.9% FLUSH
3.0000 mL | Freq: Two times a day (BID) | INTRAVENOUS | Status: DC
  Administered 2020-01-11 – 2020-01-13 (×3): 3 mL via INTRAVENOUS

## 2020-01-11 MED ORDER — HYDROCODONE-ACETAMINOPHEN 10-325 MG PO TABS: 1.0000 | ORAL_TABLET | Freq: Three times a day (TID) | ORAL | Status: DC | PRN

## 2020-01-11 MED ORDER — HYDROCODONE-ACETAMINOPHEN 10-325 MG PO TABS
1.0000 | ORAL_TABLET | Freq: Three times a day (TID) | ORAL | Status: DC | PRN
  Administered 2020-01-11 – 2020-01-14 (×7): 1 via ORAL
  Filled 2020-01-11 (×7): qty 1

## 2020-01-11 MED ORDER — FAMOTIDINE 20 MG PO TABS
20.0000 mg | ORAL_TABLET | Freq: Every day | ORAL | Status: DC
  Administered 2020-01-12 – 2020-01-14 (×3): 20 mg via ORAL
  Filled 2020-01-11 (×3): qty 1

## 2020-01-11 MED ORDER — ACETAMINOPHEN 325 MG PO TABS: 650.0000 mg | ORAL_TABLET | Freq: Four times a day (QID) | ORAL | Status: DC | PRN

## 2020-01-11 MED ORDER — METOPROLOL TARTRATE 25 MG PO TABS
25.0000 mg | ORAL_TABLET | Freq: Two times a day (BID) | ORAL | Status: DC
  Administered 2020-01-11 – 2020-01-13 (×5): 25 mg via ORAL
  Filled 2020-01-11 (×5): qty 1

## 2020-01-11 MED ORDER — ALBUTEROL SULFATE HFA 108 (90 BASE) MCG/ACT IN AERS
2.0000 | INHALATION_SPRAY | Freq: Four times a day (QID) | RESPIRATORY_TRACT | Status: DC | PRN
  Filled 2020-01-11: qty 6.7

## 2020-01-11 MED ORDER — DILTIAZEM HCL 25 MG/5ML IV SOLN
20.0000 mg | Freq: Once | INTRAVENOUS | Status: AC
  Administered 2020-01-11: 20 mg via INTRAVENOUS
  Filled 2020-01-11: qty 5

## 2020-01-11 NOTE — H&P (Addendum)
History and Physical   Peter Barnett FBP:[id number] DOB: 01-31-1961 DOA: 01/11/2020  PCP: [name], MD   Patient coming from: Home  Chief Complaint: Shortness of breath, palpitations  HPI: Peter Barnett is a 59 y.o. male with medical history significant of atrial fibrillation, COPD, CAD status post stent, hypertension, hyperlipidemia, GERD, history of AVMs and prior intracranial hemorrhage, seizures who presents with several days of shortness of breath and 1 day of palpitations.  He recently went on a trip to [place] and arrived home earlier today.  He has had some shortness of breath for the last several days as above.  And he started to get palpitations when unloading after his trip to [place].  He reports chronic edema.  He denies fever, cough, chest pain, abdominal pain, nausea, constipation, diarrhea.  ED Course: Vitals in the ED significant for heart rate in the 130s.  BMP showed bicarb of 17 with gap mildly elevated at 15.  CBC was within normal limits.  BNP was 136, troponin of 27 and 39 on repeat.  Respiratory panel for flu and Covid negative.  Chest x-ray is within normal limits.  CTA showed small subsegmental PE.  Patient was started on dill drip and heparin drip in ED.  It appears that cardiology was consulted based on chart review but does not appear that the EDP has spoken with them.  Review of Systems: As per HPI otherwise all other systems reviewed and are negative.  Past Medical History:  Diagnosis Date  . CAD (coronary artery disease)    a. MI/DES to Cx 2009 with staged PCI of LAD with DES and distal RCA with BMS. b. NSTEMI 12/2015 s/p DES to D1, known occlusion of dRCA tx medically.  . Chronic lower back pain    Still with recurrent left sided LBP with paresthesias down left leg --primarily with activities: pain pill use is avg <90 tabs per mo  . COPD (chronic obstructive pulmonary disease) ([place])    NOTED on CT chest 11/2013 done for lung ca screening.  Hosp  for acute exac 11/2014.  Spirometry not supportive of this dx as of pulm eval 2017, though.  Spireva trial by Dr. Elsworth Soho 02/2015.  Marland Kitchen GERD (gastroesophageal reflux disease)   . Heart attack ([place]) 12/12/2015  . History of adenomatous polyp of colon 02/2009;02/2014   Recall 5 yrs ([facility])  . History of kidney stones   . HTN (hypertension)   . Hyperlipidemia   . Intracranial hemorrhage ([place]) 12/27/14   small acute hemorrhage in gliotic medial R frontal lobe  . Memory loss   . MI (myocardial infarction) ([place]) 2009   "just had arm pain; never any chest pain"  . Migraine syndrome 10/07/2011    a couple per month usually, but worsening 2017 so Dr. Krista Blue adjusted med  . 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 Seizures ([place])    last one was fall of 2016.  Complex partial sz d/o.  Marland Kitchen Short-term memory loss 10/07/2011   "post brain OR & from his seizures"--worsening 2017, Dr. Krista Blue in neurology doing w/u.  Marland Kitchen Stroke ([place]) 12/2014  . Tobacco abuse   . Urethral stricture since 1981   Recurrent dilation has been required [facility] urology)    Past Surgical History:  Procedure Laterality Date  . BACK SURGERY  11/25/2015  . CARDIAC CATHETERIZATION  2011   [place], records unavailable: pt reports "normal".  . CARDIAC CATHETERIZATION N/A 12/15/2015   Procedure: Left Heart Cath and Coronary Angiography;  Surgeon: Peter M Martinique, MD;  Location: [place] CV LAB;  Service: Cardiovascular;  Laterality: N/A;  . CARDIAC CATHETERIZATION N/A 12/15/2015   Procedure: Coronary Stent Intervention;  Surgeon: Peter M Martinique, MD;  Location: [place] CV LAB;  Service: Cardiovascular;  Laterality: N/A;  . CARDIOVERSION N/A 06/21/2018   Procedure: CARDIOVERSION;  Surgeon: Acie Fredrickson Wonda Cheng, MD;  Location: [facility] ENDOSCOPY;  Service: Cardiovascular;  Laterality: N/A;  . COLONOSCOPY W/ POLYPECTOMY  02/2009;02/2014   Recall 5 yr (aden poly, hyperplast polyp, int and ext hemorr).  Next recall is 02/2019.  Marland Kitchen  CORONARY ANGIOPLASTY WITH STENT PLACEMENT  2009   3 DES to LCx; still with known distal RCA/PDA occlusion.  EF 60% by LV-gram.  . CRANIOTOMY FOR AVM  1981   Dx'd after pt began having seizures.  . CT ANGIOGRAM (CEREBRAL)  12/31/14   NORMAL--plan per neuro is to repeat this in 3 mo.  Pt states it was repeated Spring 2017 and was "fine"  . CYSTOSCOPY WITH URETHRAL DILATATION N/A 03/27/2019   Procedure: CYSTOSCOPY WITH URETHRAL DILATATION;  Surgeon: Irine Seal, MD;  Location: [facility] ORS;  Service: Urology;  Laterality: N/A;  . LUMBAR [place] SURGERY  2008; 2009   Dr. Joya Salm  . LUMBAR LAMINECTOMY/DECOMPRESSION MICRODISCECTOMY N/A 11/25/2015   Procedure: Lumbar three- four Lumbar four- five Laminectomy/Foraminotomy;  Surgeon: Leeroy Cha, MD;  Location: [place];  Service: Neurosurgery;  Laterality: N/A;  L3-4 L4-5 Laminectomy/Foraminotomy/possible Lumbar Drain  . TEE WITHOUT CARDIOVERSION N/A 06/21/2018   Procedure: TRANSESOPHAGEAL ECHOCARDIOGRAM (TEE);  Surgeon: Acie Fredrickson Wonda Cheng, MD;  Location: [facility] ENDOSCOPY;  Service: Cardiovascular;  Laterality: N/A;  . TONSILLECTOMY     "I was little"  . TRANSTHORACIC ECHOCARDIOGRAM  12/07/14   Normal LV size/fxn, EF 60%, normal valves.  Marland Kitchen URETEROSCOPY WITH HOLMIUM LASER LITHOTRIPSY Left 03/27/2019   Procedure: cysto with dilation of uretheral sticture, ureteroscopy and left retrograde pyelogram.;  Surgeon: Irine Seal, MD;  Location: [facility];  Service: Urology;  Laterality: Left;    Social History  reports that he quit smoking about 18 years ago. His smoking use included cigarettes. He has a 81.00 pack-year smoking history. He has never used smokeless tobacco. He reports current alcohol use of about 7.0 standard drinks of alcohol per week. He reports that he does not use drugs.  Allergies  Allergen Reactions  . Brilinta [Ticagrelor] Other (See Comments)    HISTORY OF A BRAIN BLEED- was told by an MD to not take   . Tramadol Other (See Comments)    SEIZURES and  "Staring off spells"  . Atorvastatin Other (See Comments)    MYALGIAS  . Ezetimibe Other (See Comments)    Muscle pain  . Morphine And Related Nausea And Vomiting    Family History  Problem Relation Age of Onset  . Hyperlipidemia Mother   . Cancer Father        Mesothelioma  . Hypertension Father   . Asthma Father   . Heart attack Paternal Uncle        In 3s too  . Stroke Neg Hx   Reviewed on admission  Prior to Admission medications   Medication Sig Start Date End Date Taking? Authorizing Provider  acetaminophen (TYLENOL) 500 MG tablet Take 1 tablet (500 mg total) by mouth every 6 (six) hours as needed for headache. Patient taking differently: Take 1,000 mg by mouth every 6 (six) hours as needed for headache.  12/16/15  Yes Cheryln Manly, NP  albuterol (PROVENTIL HFA;VENTOLIN HFA)  108 (90 BASE) MCG/ACT inhaler Inhale 2 puffs into the lungs every 6 (six) hours as needed for wheezing or shortness of breath. 12/08/14  Yes Reyne Dumas, MD  aspirin EC 81 MG tablet Take 81 mg by mouth at bedtime.    Yes [provider]  Cholecalciferol (VITAMIN D3) 50 MCG (2000 UT) TABS Take 2,000 Units by mouth daily.   Yes [provider]  docusate sodium (COLACE) 100 MG capsule Take 100 mg by mouth 2 (two) times daily.    Yes [provider]  famotidine (PEPCID) 20 MG tablet Take 20 mg by mouth daily before breakfast.    Yes [provider]  furosemide (LASIX) 20 MG tablet Take 20 mg by mouth 2 (two) times daily. 12/25/19  Yes [provider]  HYDROcodone-acetaminophen (NORCO) 10-325 MG per tablet Take 1 tablet by mouth every 8 (eight) hours as needed (for pain).  08/07/14  Yes [provider]  metoprolol tartrate (LOPRESSOR) 25 MG tablet Take 25 mg by mouth 2 (two) times daily.   Yes [provider]  nitroGLYCERIN (NITROSTAT) 0.4 MG SL tablet Place 1 tablet (0.4 mg total) under the tongue every 5 (five) minutes as needed for chest  pain (CP or SOB). Patient taking differently: Place 0.4 mg under the tongue every 5 (five) minutes as needed for chest pain (OR shortness of breath).  12/16/15 01/10/21 Yes Jettie Booze, MD  oxcarbazepine (TRILEPTAL) 600 MG tablet TAKE ONE TABLET BY MOUTH TWICE DAILY Patient taking differently: Take 600 mg by mouth 2 (two) times daily.  06/23/16  Yes Marcial Pacas, MD  rosuvastatin (CRESTOR) 40 MG tablet Take 1 tablet (40 mg total) by mouth daily. Patient taking differently: Take 40 mg by mouth every other day.  07/25/17  Yes Jettie Booze, MD  diltiazem (CARDIZEM CD) 180 MG 24 hr capsule Take 1 capsule (180 mg total) by mouth daily. Patient not taking: Reported on 01/11/2020 06/21/18 01/11/20  Florencia Reasons, MD    Physical Exam: Vitals:   01/11/20 1800 01/11/20 2000 01/11/20 2115 01/11/20 2200  BP: 139/85 125/88 (!) 129/92 128/86  Pulse: (!) 129 (!) 131 (!) 124 (!) 131  Resp: 17 18 (!) 26 18  Temp:      TempSrc:      SpO2: 97% 94% 96% 97%  Weight:      Height:       Physical Exam Constitutional:      General: He is not in acute distress.    Appearance: Normal appearance.  HENT:     Head: Normocephalic and atraumatic.     Mouth/Throat:     Mouth: Mucous membranes are moist.     Pharynx: Oropharynx is clear.  Eyes:     Extraocular Movements: Extraocular movements intact.     Pupils: Pupils are equal, round, and reactive to light.  Cardiovascular:     Rate and Rhythm: Regular rhythm. Tachycardia present.     Pulses: Normal pulses.     Heart sounds: Normal heart sounds.  Pulmonary:     Effort: Pulmonary effort is normal. No respiratory distress.     Breath sounds: Normal breath sounds.  Abdominal:     General: Bowel sounds are normal. There is no distension.     Palpations: Abdomen is soft.     Tenderness: There is no abdominal tenderness.  Musculoskeletal:        General: No swelling or deformity.     Right lower leg: Edema present.  Left lower leg: Edema present.   Skin:    General: Skin is warm and dry.  Neurological:     General: No focal deficit present.     Mental Status: Mental status is at baseline.    Labs on Admission: I have personally reviewed following labs and imaging studies  CBC: Recent Labs  Lab 01/11/20 1521  WBC 9.2  HGB 17.0  HCT 54.3*  MCV 94.6  PLT 034    Basic Metabolic Panel: Recent Labs  Lab 01/11/20 1521  NA 139  K 3.7  CL 107  CO2 17*  GLUCOSE 96  BUN 12  CREATININE 0.87  CALCIUM 9.0    GFR: Estimated Creatinine Clearance: 115.6 mL/min (by C-G formula based on SCr of 0.87 mg/dL).  Liver Function Tests: No results for input(s): AST, ALT, ALKPHOS, BILITOT, PROT, ALBUMIN in the last 168 hours.  Urine analysis:    Component Value Date/Time   COLORURINE RED (A) 04/28/2013 2105   APPEARANCEUR TURBID (A) 04/28/2013 2105   LABSPEC 1.019 04/28/2013 2105   PHURINE 7.0 04/28/2013 2105   GLUCOSEU NEGATIVE 04/28/2013 2105   HGBUR LARGE (A) 04/28/2013 2105   BILIRUBINUR neg 05/12/2013 1123   KETONESUR 40 (A) 04/28/2013 2105   PROTEINUR 100 05/12/2013 1123   PROTEINUR 100 (A) 04/28/2013 2105   UROBILINOGEN 0.2 05/12/2013 1123   UROBILINOGEN 1.0 04/28/2013 2105   NITRITE positive 05/12/2013 1123   NITRITE POSITIVE (A) 04/28/2013 2105   LEUKOCYTESUR moderate (2+) 05/12/2013 1123    Radiological Exams on Admission: DG Chest 2 View  Result Date: 01/11/2020 CLINICAL DATA:  Chest pain. EXAM: CHEST - 2 VIEW COMPARISON:  Jun 20, 2018 FINDINGS: The heart, hila, and mediastinum are normal. No pneumothorax. No nodules or masses. No focal infiltrates. IMPRESSION: No active cardiopulmonary disease. Electronically Signed   By: Dorise Bullion III M.D   On: 01/11/2020 16:02   CT Angio Chest PE W/Cm &/Or Wo Cm  Result Date: 01/11/2020 CLINICAL DATA:  Shortness of breath.  Fall. EXAM: CT ANGIOGRAPHY CHEST WITH CONTRAST TECHNIQUE: Multidetector CT imaging of the chest was performed using the standard protocol during  bolus administration of intravenous contrast. Multiplanar CT image reconstructions and MIPs were obtained to evaluate the vascular anatomy. CONTRAST:  64mL OMNIPAQUE IOHEXOL 350 MG/ML SOLN COMPARISON:  Chest CT 12/20/2014. FINDINGS: Cardiovascular: The heart size is normal. No substantial pericardial effusion. Coronary artery calcification is evident. No thoracic aortic aneurysm. There is no large central pulmonary embolus. No lobar or segmental embolus. A tiny nonocclusive subsegmental pulmonary embolus is identified in the left upper lobe (axial image 179 of series 7 and best demonstrated on sagittal images 128-131 of series 9). No other findings of acute pulmonary embolus. Mediastinum/Nodes: No mediastinal lymphadenopathy. There is no hilar lymphadenopathy. The esophagus has normal imaging features. There is no axillary lymphadenopathy. Lungs/Pleura: Centrilobular and paraseptal emphysema evident. No evidence for pneumothorax or pleural effusion. 5 mm right pulmonary nodule on 69/6 is stable since prior study consistent with benign etiology. Tiny perifissural nodule in the right lower lobe on 70/6 is also stable. No focal airspace consolidation. Upper Abdomen: 15 mm right adrenal nodule has average attenuation of -3 Hounsfield units, consistent with adenoma. Musculoskeletal: No worrisome lytic or sclerotic osseous abnormality. Review of the MIP images confirms the above findings. IMPRESSION: 1. Tiny nonocclusive subsegmental pulmonary embolus in the left upper lobe. No other acute pulmonary embolus on the current exam. 2. Tiny right lung nodule stable since 2016 consistent with benign etiology. 3. 15  mm right adrenal adenoma. 4. Emphysema (ICD10-J43.9). Electronically Signed   By: Misty Stanley M.D.   On: 01/11/2020 20:11    EKG: Independently reviewed.  Atrial flutter with RVR, heart rate 130.  Assessment/Plan Principal Problem:   Atrial fibrillation with RVR (HCC) Active Problems:   Seizure (HCC)    HTN (hypertension), benign   Hyperlipidemia   Coronary artery disease involving native coronary artery of native heart without angina pectoris   Breath shortness   COPD (chronic obstructive pulmonary disease) (HCC)   GERD (gastroesophageal reflux disease)   Essential hypertension   Personal history of arterial venous malformation (AVM)   S/P coronary artery stent placement   H/O intracranial hemorrhage  Small subsegmental PE Atrial flutter with RVR > Has about 1 year history of A. fib, discharged with 1 month of Eliquis after new onset A. fib and on follow-up switched to aspirin alone as he has a history of intracranial bleed secondary to AVMs in 2016 > Patient just returned from a trip to [place] > 3 days of shortness of breath and 1 day palpitations > PE likely contributing trigger to RVR > Cardiology consulted by EDP but it did not appear that he had heard from cards at the time I spoke with him. - We will likely need input from cardiology and/or neuro on anticoagulation plans - Continue diltiazem drip - Continue heparin drip - Continue home metoprolol - Follow-up DVT study  Hypertension Hyperlipidemia CAD status post stent x3 - Continue home metoprolol, Lasix, rosuvastatin, aspirin  COPD - Continue home albuterol  GERD - Continue home Pepcid  Seizure - Continue home Trileptal  Chronic back pain - Continue home Norco  History of intracranial bleed secondary to AVMs in 2016 > No evidence of residual or recurrent AVMs on angiogram on 12/31/2014   DVT prophylaxis: Heparin  Code Status:   Full  Family Communication:  None on admission Disposition Plan:   Patient is from:  Home  Anticipated DC to:  Home  Anticipated DC date:  Pending clinical course  Anticipated DC barriers: None  Consults called:  Cardiology consult for EP had not heard back yet Admission status:  Observation, progressive   Severity of Illness: The appropriate patient status for this patient  is OBSERVATION. Observation status is judged to be reasonable and necessary in order to provide the required intensity of service to ensure the patient's safety. The patient's presenting symptoms, physical exam findings, and initial radiographic and laboratory data in the context of their medical condition is felt to place them at decreased risk for further clinical deterioration. Furthermore, it is anticipated that the patient will be medically stable for discharge from the hospital within 2 midnights of admission. The following factors support the patient status of observation.   " The patient's presenting symptoms include shortness of breath, palpitations. " The physical exam findings include tachycardia. " The initial radiographic and laboratory data are consistent with atrial flutter with RVR and pulmonary embolism.   Marcelyn Bruins MD [facility]  How to contact the [facility] Attending or Consulting provider [place] or covering provider during after hours [place], for this patient?   1. Check the care team in [facility] and look for a) attending/consulting [facility] provider listed and b) the [facility] team listed 2. Log into [email or web address] and use 's universal password to access. If you do not have the password, please contact the hospital operator. 3. Locate the [facility] provider you are looking for under [facility]  and page to a number that you can be directly reached. 4. If you still have difficulty reaching the provider, please page the [facility] (Director on Call) for the Hospitalists listed on amion for assistance.  01/11/2020, 10:36 PM

## 2020-01-11 NOTE — Telephone Encounter (Signed)
Patient c/o Palpitations:  High priority if patient c/o lightheadedness, shortness of breath, or chest pain  1) How long have you had palpitations/irregular HR/ Afib? Are you having the symptoms now? A couple hours; yes  2) Are you currently experiencing lightheadedness, SOB or CP? SOB  3) Do you have a history of afib (atrial fibrillation) or irregular heart rhythm? Yes  4) Have you checked your BP or HR? (document readings if available): HR: 126 BP: 130/105; 130/85  5) Are you experiencing any other symptoms? No

## 2020-01-11 NOTE — ED Triage Notes (Signed)
Pt reports heart fluttering, thinks he is in a fib. Pt denies any chest pain, some SOB.

## 2020-01-11 NOTE — Progress Notes (Signed)
Attempted lower extremity venous, patient refused.     Abram Sander 01/11/2020 5:33 PM

## 2020-01-11 NOTE — Telephone Encounter (Signed)
Spoke with patient's spouse as patient is short of breath. Patient returned from [place] and was unloading car when he became aware that his heart rate was irregular. They took out the pulse OX and patient's HR was 126. BP at time of call is 128/90.   Per protocol patient is not anticoagulated, symptomatic and HR >120 advised to call 911. Spouse is going to drive patient to ER instead. Patient heading to [facility].

## 2020-01-11 NOTE — Telephone Encounter (Signed)
I have not met him before. His appointment next week was going to be his first with me. Agree with ER visit though.  [place] T. [name], [place]  [address] [place], [place] [address] [phone]  2:18 PM

## 2020-01-11 NOTE — Progress Notes (Signed)
[place] for Heparin Indication: atrial fibrillation and pulmonary embolus  Allergies  Allergen Reactions  . Tramadol Other (See Comments)    SEIZURES "Staring off spells"  . Atorvastatin Other (See Comments)    MYALGIAS  . Ezetimibe     Myalgia  Other reaction(s): Other Myalgia   . Morphine And Related Nausea And Vomiting  . Brilinta [Ticagrelor] Other (See Comments)    HISTORY OF A BRAIN BLEED    Patient Measurements: Height: 5\' 11"  (180.3 cm) Weight: 113.4 kg (250 lb) IBW/kg (Calculated) : 75.3 Heparin Dosing Weight: 99.9 kg   Vital Signs: Temp: 98 F (36.7 C) (12/03 1522) Temp Source: Oral (12/03 1522) BP: 125/88 (12/03 2000) Pulse Rate: 131 (12/03 2000)  Labs: Recent Labs    01/11/20 1521 01/11/20 1811  HGB 17.0  --   HCT 54.3*  --   PLT 162  --   CREATININE 0.87  --   TROPONINIHS 27* 39*    Estimated Creatinine Clearance: 115.6 mL/min (by C-G formula based on SCr of 0.87 mg/dL).   Medical History: Past Medical History:  Diagnosis Date  . CAD (coronary artery disease)    a. MI/DES to Cx 2009 with staged PCI of LAD with DES and distal RCA with BMS. b. NSTEMI 12/2015 s/p DES to D1, known occlusion of dRCA tx medically.  . Chronic lower back pain    Still with recurrent left sided LBP with paresthesias down left leg --primarily with activities: pain pill use is avg <90 tabs per mo  . COPD (chronic obstructive pulmonary disease) ([place])    NOTED on CT chest 11/2013 done for lung ca screening.  Hosp for acute exac 11/2014.  Spirometry not supportive of this dx as of pulm eval 2017, though.  Spireva trial by Dr. Elsworth Soho 02/2015.  Marland Kitchen GERD (gastroesophageal reflux disease)   . Heart attack ([place]) 12/12/2015  . History of adenomatous polyp of colon 02/2009;02/2014   Recall 5 yrs ([facility])  . History of kidney stones   . HTN (hypertension)   . Hyperlipidemia   . Intracranial hemorrhage ([place]) 12/27/14   small  acute hemorrhage in gliotic medial R frontal lobe  . Memory loss   . MI (myocardial infarction) ([place]) 2009   "just had arm pain; never any chest pain"  . Migraine syndrome 10/07/2011    a couple per month usually, but worsening 2017 so Dr. Krista Blue adjusted med  . 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 Seizures ([place])    last one was fall of 2016.  Complex partial sz d/o.  Marland Kitchen Short-term memory loss 10/07/2011   "post brain OR & from his seizures"--worsening 2017, Dr. Krista Blue in neurology doing w/u.  Marland Kitchen Stroke ([place]) 12/2014  . Tobacco abuse   . Urethral stricture since 1981   Recurrent dilation has been required [facility] urology)    Medications:  Scheduled:  . heparin  6,000 Units Intravenous Once    Assessment: Patient is a 16 yom that presents to the ED in afib with RVR. The patient has a hx of an [place] in 2016 and has been on anticoagulation since this. Patient still continues to have paroxsymal afib and also was fount to have a small subsegmental PE. Pharmacy has been asked to dose heparin at this time.  Goal of Therapy:  Heparin level 0.3-0.7 units/ml Monitor platelets by anticoagulation protocol: Yes   Plan:  - Heparin bolus 6000 units IV x 1 dose - Heparin drip @ 1200 units/hr -  Heparin level in ~ 6 hours  - Monitor patient for s/s of bleeding and CBC while on heparin   Duanne Limerick PharmD. BCPS  01/11/2020,8:53 PM

## 2020-01-11 NOTE — ED Provider Notes (Signed)
[name] EMERGENCY DEPARTMENT Provider Note   CSN: [id number] Arrival date & time: 01/11/20  1500     History Chief Complaint  Patient presents with  . Atrial Fibrillation    Peter Barnett is a 59 y.o. male.  59 year old male with history of a-fib (remote history of intracranial bleed due to AVM, not anticoagulated at this time), hypertension, MI (with stent), CAD, additional history as listed below, presents with complaint of [facility] with heart racing onset around 1PM today while unloading the car after a trip to [place]. Symptoms have been constant, denies chest pain. Reports lower extremity swelling, left worse than right which his chronic and unchanged.         Past Medical History:  Diagnosis Date  . CAD (coronary artery disease)    a. MI/DES to Cx 2009 with staged PCI of LAD with DES and distal RCA with BMS. b. NSTEMI 12/2015 s/p DES to D1, known occlusion of dRCA tx medically.  . Chronic lower back pain    Still with recurrent left sided LBP with paresthesias down left leg --primarily with activities: pain pill use is avg <90 tabs per mo  . COPD (chronic obstructive pulmonary disease) ([place])    NOTED on CT chest 11/2013 done for lung ca screening.  Hosp for acute exac 11/2014.  Spirometry not supportive of this dx as of pulm eval 2017, though.  Spireva trial by Dr. Elsworth Soho 02/2015.  Marland Kitchen GERD (gastroesophageal reflux disease)   . Heart attack ([place]) 12/12/2015  . History of adenomatous polyp of colon 02/2009;02/2014   Recall 5 yrs ([facility])  . History of kidney stones   . HTN (hypertension)   . Hyperlipidemia   . Intracranial hemorrhage ([place]) 12/27/14   small acute hemorrhage in gliotic medial R frontal lobe  . Memory loss   . MI (myocardial infarction) ([place]) 2009   "just had arm pain; never any chest pain"  . Migraine syndrome 10/07/2011    a couple per month usually, but worsening 2017 so Dr. Krista Blue adjusted med  . 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 Seizures ([place])    last one was fall of 2016.  Complex partial sz d/o.  Marland Kitchen Short-term memory loss 10/07/2011   "post brain OR & from his seizures"--worsening 2017, Dr. Krista Blue in neurology doing w/u.  Marland Kitchen Stroke ([place]) 12/2014  . Tobacco abuse   . Urethral stricture since 1981   Recurrent dilation has been required [facility] urology)    Patient Active Problem List   Diagnosis Date Noted  . H/O intracranial hemorrhage   . Atrial fibrillation with rapid ventricular response ([place]) 06/20/2018  . Atrial fibrillation ([place]) 06/20/2018  . History of seizure 02/15/2017  . S/P coronary artery stent placement 01/28/2017  . Chronic bilateral low back pain with left-sided sciatica 06/09/2016  . Obesity (BMI 30-39.9) 06/07/2016  . NSTEMI (non-ST elevated myocardial infarction) ([place])   . Essential hypertension   . Pure hypercholesterolemia   . Herniated lumbar intervertebral disc 11/27/2015  . Lumbar canal stenosis 11/25/2015  . Left lumbar radiculopathy 06/17/2015  . Migraine 06/17/2015  . Hemorrhage, intracranial ([place]) 01/01/2015  . COPD (chronic obstructive pulmonary disease) ([place]) 12/27/2014  . Intracranial bleeding ([place]) 12/27/2014  . GERD (gastroesophageal reflux disease) 12/27/2014  . Left facial numbness 12/25/2014  . COPD exacerbation ([place]) 12/07/2014  . DOE (dyspnea on exertion) 12/05/2014  . Fever, unspecified 11/29/2014  . Cough 11/28/2014  . Right flank pain 03/28/2014  . Prostate  cancer screening 12/05/2013  . 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 Coronary artery disease involving native coronary artery of native heart without angina pectoris 10/25/2013  . HTN (hypertension), benign 08/29/2013  . Hyperlipidemia 08/29/2013  . Venous insufficiency of both lower extremities 08/29/2013  . History of adenomatous polyp of colon 08/29/2013  . Memory loss 10/06/2012  . Seizure ([place]) 10/06/2012  . H/O craniotomy 10/06/2012  . Arm paresthesia, left 10/07/2011  . Left arm  pain 10/07/2011  . 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 Colon polyp 01/14/2011  . H/O Spinal surgery 01/14/2011  . Cerebral seizure ([place]) 01/14/2011  . Arteriosclerosis of coronary artery 01/14/2011  . Personal history of arterial venous malformation (AVM) 01/14/2011  . Breath shortness 03/25/2009    Past Surgical History:  Procedure Laterality Date  . BACK SURGERY  11/25/2015  . CARDIAC CATHETERIZATION  2011   [facility], records unavailable: pt reports "normal".  . CARDIAC CATHETERIZATION N/A 12/15/2015   Procedure: Left Heart Cath and Coronary Angiography;  Surgeon: Peter M Martinique, MD;  Location: [facility] CV LAB;  Service: Cardiovascular;  Laterality: N/A;  . CARDIAC CATHETERIZATION N/A 12/15/2015   Procedure: Coronary Stent Intervention;  Surgeon: Peter M Martinique, MD;  Location: [facility] CV LAB;  Service: Cardiovascular;  Laterality: N/A;  . CARDIOVERSION N/A 06/21/2018   Procedure: CARDIOVERSION;  Surgeon: Acie Fredrickson Wonda Cheng, MD;  Location: [facility] ENDOSCOPY;  Service: Cardiovascular;  Laterality: N/A;  . COLONOSCOPY W/ POLYPECTOMY  02/2009;02/2014   Recall 5 yr (aden poly, hyperplast polyp, int and ext hemorr).  Next recall is 02/2019.  Marland Kitchen CORONARY ANGIOPLASTY WITH STENT PLACEMENT  2009   3 DES to LCx; still with known distal RCA/PDA occlusion.  EF 60% by LV-gram.  . CRANIOTOMY FOR AVM  1981   Dx'd after pt began having seizures.  . CT ANGIOGRAM (CEREBRAL)  12/31/14   NORMAL--plan per neuro is to repeat this in 3 mo.  Pt states it was repeated Spring 2017 and was "fine"  . CYSTOSCOPY WITH URETHRAL DILATATION N/A 03/27/2019   Procedure: CYSTOSCOPY WITH URETHRAL DILATATION;  Surgeon: Irine Seal, MD;  Location: [facility] ORS;  Service: Urology;  Laterality: N/A;  . LUMBAR [place] SURGERY  2008; 2009   Dr. Joya Salm  . LUMBAR LAMINECTOMY/DECOMPRESSION MICRODISCECTOMY N/A 11/25/2015   Procedure: Lumbar three- four Lumbar four- five Laminectomy/Foraminotomy;  Surgeon: Leeroy Cha,  MD;  Location: [facility];  Service: Neurosurgery;  Laterality: N/A;  L3-4 L4-5 Laminectomy/Foraminotomy/possible Lumbar Drain  . TEE WITHOUT CARDIOVERSION N/A 06/21/2018   Procedure: TRANSESOPHAGEAL ECHOCARDIOGRAM (TEE);  Surgeon: Acie Fredrickson Wonda Cheng, MD;  Location: [facility] ENDOSCOPY;  Service: Cardiovascular;  Laterality: N/A;  . TONSILLECTOMY     "I was little"  . TRANSTHORACIC ECHOCARDIOGRAM  12/07/14   Normal LV size/fxn, EF 60%, normal valves.  Marland Kitchen URETEROSCOPY WITH HOLMIUM LASER LITHOTRIPSY Left 03/27/2019   Procedure: cysto with dilation of uretheral sticture, ureteroscopy and left retrograde pyelogram.;  Surgeon: Irine Seal, MD;  Location: [facility];  Service: Urology;  Laterality: Left;       Family History  Problem Relation Age of Onset  . Hyperlipidemia Mother   . Cancer Father        Mesothelioma  . Hypertension Father   . Asthma Father   . Heart attack Paternal Uncle        In 21s too  . Stroke Neg Hx     Social History   Tobacco Use  . Smoking status: Former Smoker    Packs/day: 3.00  Years: 27.00    Pack years: 81.00    Types: Cigarettes    Quit date: 02/08/2001    Years since quitting: 18.9  . Smokeless tobacco: Never Used  Vaping Use  . Vaping Use: Never used  Substance Use Topics  . Alcohol use: Yes    Alcohol/week: 7.0 standard drinks    Types: 7 Cans of beer per week    Comment: 12 oz beer   . Drug use: No    Home Medications Prior to Admission medications   Medication Sig Start Date End Date Taking? Authorizing Provider  acetaminophen (TYLENOL) 500 MG tablet Take 1 tablet (500 mg total) by mouth every 6 (six) hours as needed for headache. Patient taking differently: Take 1,000 mg by mouth every 6 (six) hours as needed for headache.  12/16/15  Yes Cheryln Manly, NP  albuterol (PROVENTIL HFA;VENTOLIN HFA) 108 (90 BASE) MCG/ACT inhaler Inhale 2 puffs into the lungs every 6 (six) hours as needed for wheezing or shortness of breath. 12/08/14  Yes Reyne Dumas, MD   aspirin EC 81 MG tablet Take 81 mg by mouth at bedtime.    Yes [provider]  Cholecalciferol (VITAMIN D3) 50 MCG (2000 UT) TABS Take 2,000 Units by mouth daily.   Yes [provider]  docusate sodium (COLACE) 100 MG capsule Take 100 mg by mouth 2 (two) times daily.    Yes [provider]  famotidine (PEPCID) 20 MG tablet Take 20 mg by mouth daily before breakfast.    Yes [provider]  furosemide (LASIX) 20 MG tablet Take 20 mg by mouth 2 (two) times daily. 12/25/19  Yes [provider]  HYDROcodone-acetaminophen (NORCO) 10-325 MG per tablet Take 1 tablet by mouth every 8 (eight) hours as needed (for pain).  08/07/14  Yes [provider]  metoprolol tartrate (LOPRESSOR) 25 MG tablet Take 25 mg by mouth 2 (two) times daily.   Yes [provider]  nitroGLYCERIN (NITROSTAT) 0.4 MG SL tablet Place 1 tablet (0.4 mg total) under the tongue every 5 (five) minutes as needed for chest pain (CP or SOB). Patient taking differently: Place 0.4 mg under the tongue every 5 (five) minutes as needed for chest pain (OR shortness of breath).  12/16/15 01/10/21 Yes Jettie Booze, MD  oxcarbazepine (TRILEPTAL) 600 MG tablet TAKE ONE TABLET BY MOUTH TWICE DAILY Patient taking differently: Take 600 mg by mouth 2 (two) times daily.  06/23/16  Yes Marcial Pacas, MD  rosuvastatin (CRESTOR) 40 MG tablet Take 1 tablet (40 mg total) by mouth daily. Patient taking differently: Take 40 mg by mouth every other day.  07/25/17  Yes Jettie Booze, MD  diltiazem (CARDIZEM CD) 180 MG 24 hr capsule Take 1 capsule (180 mg total) by mouth daily. Patient not taking: Reported on 01/11/2020 06/21/18 01/11/20  Florencia Reasons, MD    Allergies    Brilinta [ticagrelor], Tramadol, Atorvastatin, Ezetimibe, and Morphine and related  Review of Systems   Review of Systems  Constitutional: Negative for chills, diaphoresis, fatigue and fever.  Respiratory: Positive for shortness  of breath. Negative for cough and chest tightness.   Cardiovascular: Positive for palpitations and leg swelling. Negative for chest pain.  Gastrointestinal: Negative for abdominal pain, nausea and vomiting.  Musculoskeletal: Negative for arthralgias and myalgias.  Skin: Negative for rash.  Neurological: Negative for weakness.  Hematological: Does not bruise/bleed easily.  Psychiatric/Behavioral: Negative for confusion.  All other systems reviewed and are negative.   Physical Exam  Updated Vital Signs BP 128/86   Pulse (!) 131   Temp 98 F (36.7 C) (Oral)   Resp 18   Ht 5\' 11"  (1.803 m)   Wt 113.4 kg   SpO2 97%   BMI 34.87 kg/m   Physical Exam Vitals and nursing note reviewed.  Constitutional:      General: He is not in acute distress.    Appearance: He is well-developed. He is not diaphoretic.     Comments: Appears uncomfortable   HENT:     Head: Normocephalic and atraumatic.  Cardiovascular:     Rate and Rhythm: Tachycardia present. Rhythm irregular.     Pulses: Normal pulses.     Heart sounds: Normal heart sounds.  Pulmonary:     Effort: Pulmonary effort is normal.     Breath sounds: Normal breath sounds.  Chest:     Chest wall: No tenderness.  Abdominal:     Palpations: Abdomen is soft.     Tenderness: There is no abdominal tenderness.  Musculoskeletal:        General: No tenderness.     Right lower leg: Edema present.     Left lower leg: Edema present.     Comments: L>R lower extremity edema, no calf tenderness   Skin:    General: Skin is warm and dry.     Findings: No erythema or rash.  Neurological:     Mental Status: He is alert and oriented to person, place, and time.  Psychiatric:        Behavior: Behavior normal.     ED Results / Procedures / Treatments   Labs (all labs ordered are listed, but only abnormal results are displayed) Labs Reviewed  BASIC METABOLIC PANEL - Abnormal; Notable for the following components:      Result Value   CO2 17  (*)    All other components within normal limits  CBC - Abnormal; Notable for the following components:   HCT 54.3 (*)    All other components within normal limits  BRAIN NATRIURETIC PEPTIDE - Abnormal; Notable for the following components:   B Natriuretic Peptide 136.0 (*)    All other components within normal limits  TROPONIN I (HIGH SENSITIVITY) - Abnormal; Notable for the following components:   Troponin I (High Sensitivity) 27 (*)    All other components within normal limits  TROPONIN I (HIGH SENSITIVITY) - Abnormal; Notable for the following components:   Troponin I (High Sensitivity) 39 (*)    All other components within normal limits  RESP PANEL BY RT-PCR (FLU A&B, COVID) ARPGX2  HEPARIN LEVEL (UNFRACTIONATED)  CBC  TROPONIN I (HIGH SENSITIVITY)    EKG EKG Interpretation  Date/Time:  Friday January 11 2020 19:58:15 EST Ventricular Rate:  132 PR Interval:    QRS Duration: 118 QT Interval:  344 QTC Calculation: 510 R Axis:   -60 Text Interpretation: Atrial flutter Incomplete left bundle branch block LVH with secondary repolarization abnormality Confirmed by Lajean Saver [phone]) on 01/11/2020 8:06:31 PM   Radiology DG Chest 2 View  Result Date: 01/11/2020 CLINICAL DATA:  Chest pain. EXAM: CHEST - 2 VIEW COMPARISON:  Jun 20, 2018 FINDINGS: The heart, hila, and mediastinum are normal. No pneumothorax. No nodules or masses. No focal infiltrates. IMPRESSION: No active cardiopulmonary disease. Electronically Signed   By: Dorise Bullion III M.D   On: 01/11/2020 16:02   CT Angio Chest PE W/Cm &/Or Wo Cm  Result Date: 01/11/2020 CLINICAL DATA:  Shortness of breath.  Fall. EXAM: CT ANGIOGRAPHY CHEST WITH CONTRAST TECHNIQUE: Multidetector CT imaging of the chest was performed using the standard protocol during bolus administration of intravenous contrast. Multiplanar CT image reconstructions and MIPs were obtained to evaluate the vascular anatomy. CONTRAST:  83mL OMNIPAQUE  IOHEXOL 350 MG/ML SOLN COMPARISON:  Chest CT 12/20/2014. FINDINGS: Cardiovascular: The heart size is normal. No substantial pericardial effusion. Coronary artery calcification is evident. No thoracic aortic aneurysm. There is no large central pulmonary embolus. No lobar or segmental embolus. A tiny nonocclusive subsegmental pulmonary embolus is identified in the left upper lobe (axial image 179 of series 7 and best demonstrated on sagittal images 128-131 of series 9). No other findings of acute pulmonary embolus. Mediastinum/Nodes: No mediastinal lymphadenopathy. There is no hilar lymphadenopathy. The esophagus has normal imaging features. There is no axillary lymphadenopathy. Lungs/Pleura: Centrilobular and paraseptal emphysema evident. No evidence for pneumothorax or pleural effusion. 5 mm right pulmonary nodule on 69/6 is stable since prior study consistent with benign etiology. Tiny perifissural nodule in the right lower lobe on 70/6 is also stable. No focal airspace consolidation. Upper Abdomen: 15 mm right adrenal nodule has average attenuation of -3 Hounsfield units, consistent with adenoma. Musculoskeletal: No worrisome lytic or sclerotic osseous abnormality. Review of the MIP images confirms the above findings. IMPRESSION: 1. Tiny nonocclusive subsegmental pulmonary embolus in the left upper lobe. No other acute pulmonary embolus on the current exam. 2. Tiny right lung nodule stable since 2016 consistent with benign etiology. 3. 15 mm right adrenal adenoma. 4. Emphysema (ICD10-J43.9). Electronically Signed   By: Misty Stanley M.D.   On: 01/11/2020 20:11    Procedures .Critical Care Performed by: Tacy Learn, PA-C Authorized by: Tacy Learn, PA-C   Critical care provider statement:    Critical care time (minutes):  45   Critical care was time spent personally by me on the following activities:  Discussions with consultants, evaluation of patient's response to treatment, examination of  patient, ordering and performing treatments and interventions, ordering and review of laboratory studies, ordering and review of radiographic studies, pulse oximetry, re-evaluation of patient's condition, obtaining history from patient or surrogate and review of old charts   (including critical care time)  Medications Ordered in ED Medications  diltiazem (CARDIZEM) 125 mg in dextrose 5% 125 mL (1 mg/mL) infusion (5 mg/hr Intravenous New Bag/Given 01/11/20 2148)  heparin ADULT infusion 100 units/mL (25000 units/29mL sodium chloride 0.45%) (1,200 Units/hr Intravenous New Bag/Given 01/11/20 2150)  diltiazem (CARDIZEM) injection 20 mg (20 mg Intravenous Given 01/11/20 1744)  iohexol (OMNIPAQUE) 350 MG/ML injection 75 mL (75 mLs Intravenous Contrast Given 01/11/20 1923)  heparin bolus via infusion 6,000 Units (6,000 Units Intravenous Bolus from Bag 01/11/20 2157)    ED Course  I have reviewed the triage vital signs and the nursing notes.  Pertinent labs & imaging results that were available during my care of the patient were reviewed by me and considered in my medical decision making (see chart for details).  Clinical Course as of Jan 10 2205  Fri Jan 10, 6569  2778 59 year old male presents with complaint of shortness of breath and heart racing onset around 1PM today while unloading his car. On exam, appears uncomfortable, L>R lower extremity edema (states chronic), no calf tenderness. HR tachycardic around 130s, irregular. Discussed with Dr. Ashok Cordia, ER attending, patient is not anticoagulated, given Cardizem 20mg  without improvement. Patient remains uncomfortable.  Initial troponin elevated at 27, delta pending. CBC and BMP without significant findings. BNP  pending as well as venous doppler LLE.  CXR unremarkable.    [LM]  2055 CT chest to evaluate for PE shows a tiny nonocclusive subsegmental pulmonary embolus in the left upper lobe. No other acute PE. Review of prior notes, patient was admitted  to [facility] 06/2018 with a-fib, given cardizem and heparin after consult with patient's neurologist, cardioverted and discharged home on Eliquis. Heparin ordered, discussed history with pharmacy. Cardizem drip ordered. Plan is to consult cardiology.    [LM]  2059 Labs reviewed, troponin 27 initially, increased to 39 on 3 hour recheck. BNP mildly elevated at 136. CBC and BMP without significant findings.  Patient refused venous doppler LLE for lower extremity swelling, evaluate for DVT.    [LM]  2109 Awaiting return call from cardiology, hospitalist (unassigned) paged for admission.    [LM]  2145 Still awaiting Cardizem from pharmacy.   [LM]  2206 Care signed out to Dr. Ashok Cordia, ER attending, pending consult with cardiology and hospitalist.    [LM]    Clinical Course User Index [LM] Roque Lias   MDM Rules/Calculators/A&P                           Final Clinical Impression(s) / ED Diagnoses Final diagnoses:  Atrial flutter with rapid ventricular response ([place])  Other pulmonary embolism without acute cor pulmonale, unspecified chronicity [facility])    Rx / DC Orders ED Discharge Orders    None       Roque Lias 01/11/20 2206    Lajean Saver, MD 01/11/20 2231

## 2020-01-12 DIAGNOSIS — Z955 Presence of coronary angioplasty implant and graft: Secondary | ICD-10-CM | POA: Diagnosis not present

## 2020-01-12 DIAGNOSIS — I4891 Unspecified atrial fibrillation: Secondary | ICD-10-CM | POA: Diagnosis not present

## 2020-01-12 DIAGNOSIS — Z8774 Personal history of (corrected) congenital malformations of heart and circulatory system: Secondary | ICD-10-CM | POA: Diagnosis not present

## 2020-01-12 LAB — BASIC METABOLIC PANEL
Anion gap: 11 (ref 5–15)
BUN: 10 mg/dL (ref 6–20)
CO2: 22 mmol/L (ref 22–32)
Calcium: 9.3 mg/dL (ref 8.9–10.3)
Chloride: 105 mmol/L (ref 98–111)
Creatinine, Ser: 0.86 mg/dL (ref 0.61–1.24)
GFR, Estimated: >60 mL/min (ref >60)
Glucose, Bld: 137 mg/dL — ABNORMAL HIGH (ref 70–99)
Potassium: 3.6 mmol/L (ref 3.5–5.1)
Sodium: 138 mmol/L (ref 135–145)

## 2020-01-12 LAB — HEPARIN LEVEL (UNFRACTIONATED)
Heparin Unfractionated: <0.1 IU/mL — ABNORMAL LOW (ref 0.30–0.70)
Heparin Unfractionated: 0.31 IU/mL (ref 0.30–0.70)

## 2020-01-12 LAB — CBC
HCT: 50.6 % (ref 39.0–52.0)
Hemoglobin: 16.5 g/dL (ref 13.0–17.0)
MCH: 29.6 pg (ref 26.0–34.0)
MCHC: 32.6 g/dL (ref 30.0–36.0)
MCV: 90.7 fL (ref 80.0–100.0)
Platelets: 185 10*3/uL (ref 150–400)
RBC: 5.58 MIL/uL (ref 4.22–5.81)
RDW: 13.4 % (ref 11.5–15.5)
WBC: 10 10*3/uL (ref 4.0–10.5)
nRBC: 0 % (ref 0.0–0.2)

## 2020-01-12 LAB — HIV ANTIBODY (ROUTINE TESTING W REFLEX): HIV Screen 4th Generation wRfx: NONREACTIVE

## 2020-01-12 MED ORDER — HEPARIN BOLUS VIA INFUSION
2000.0000 [IU] | Freq: Once | INTRAVENOUS | Status: AC
  Administered 2020-01-12: 2000 [IU] via INTRAVENOUS
  Filled 2020-01-12: qty 2000

## 2020-01-12 MED ORDER — APIXABAN 5 MG PO TABS: 5.0000 mg | ORAL_TABLET | Freq: Two times a day (BID) | ORAL | Status: DC

## 2020-01-12 MED ORDER — APIXABAN 5 MG PO TABS
10.0000 mg | ORAL_TABLET | Freq: Two times a day (BID) | ORAL | Status: DC
  Administered 2020-01-12 – 2020-01-14 (×4): 10 mg via ORAL
  Filled 2020-01-12 (×4): qty 2

## 2020-01-12 MED ORDER — OFF THE BEAT BOOK
Freq: Once | Status: AC
  Administered 2020-01-12: 1
  Filled 2020-01-12: qty 1

## 2020-01-12 NOTE — Progress Notes (Signed)
[place] for Heparin Indication: atrial fibrillation and pulmonary embolus  Allergies  Allergen Reactions  . Brilinta [Ticagrelor] Other (See Comments)    HISTORY OF A BRAIN BLEED- was told by an MD to not take   . Tramadol Other (See Comments)    SEIZURES and "Staring off spells"  . Atorvastatin Other (See Comments)    MYALGIAS  . Ezetimibe Other (See Comments)    Muscle pain  . Morphine And Related Nausea And Vomiting    Patient Measurements: Height: 5\' 11"  (180.3 cm) Weight: 113.4 kg (250 lb) IBW/kg (Calculated) : 75.3 Heparin Dosing Weight: 99.9 kg   Vital Signs: BP: 119/76 (12/04 0435) Pulse Rate: 73 (12/04 0435)  Labs: Recent Labs    01/11/20 1521 01/11/20 1811 01/11/20 2133 01/12/20 0352  HGB 17.0  --   --   --   HCT 54.3*  --   --   --   PLT 162  --   --   --   HEPARINUNFRC  --   --   --  <0.10*  CREATININE 0.87  --   --  0.86  TROPONINIHS 27* 39* 38*  --     Estimated Creatinine Clearance: 116.9 mL/min (by C-G formula based on SCr of 0.86 mg/dL).   Assessment: 59 y.o. male with AFib and small PE for heparin  Goal of Therapy:  Heparin level 0.3-0.7 units/ml Monitor platelets by anticoagulation protocol: Yes   Plan:  Heparin 2000 units IV bolus, then increase heparin 1700 units/hr   Peter Barnett, [place] Curb PharmD. BCPS  01/12/2020,5:07 AM

## 2020-01-12 NOTE — Consult Note (Addendum)
Cardiology Consultation:   Patient ID: Peter Barnett; [id number]; 04-Aug-1960   Admit date: 01/11/2020 Date of Consult: 01/12/2020  Primary Care Provider: [name], MD Primary Cardiologist: Ellis Parents, MD, has not seen recently, changing to [facility], has appt w/ Dr [name] Primary Electrophysiologist:  None   Patient Profile:   Peter Barnett is a 59 y.o. male with a hx of CAD w/ DES CFX/LAD/RCA 2009, MI w/ DES D1 2017 w/ dRCA 100%, HLN, HLD, COPD, GERD, CVA w/ small ICH 2016, intracranial AVMs, Sz, with a history of atrial fibrillation 7/20 at which point he was anticoagulated and underwent TEE guided cardioversion with subsequent discontinuation of his anticoagulation and treatment with aspirin admitted today with recurrent atrial fibrillation associated with dyspnea on exertion.  History of Present Illness:   Peter Barnett is scheduled to be seen by Dr [name] on 12/08.   He came to the ER yesterday with SOB and palpitations, in rapid Afib, He has had chronic and worsening dyspnea on exertion over the last year or 2.  He is limited to less than about 100 yards.  This was considerably worse on his return from [place] over the last 48 hours.  Accompanied tachypalpitations.  Heart rates noted to be in the 130s.  Has chronic edema of hands and feet.  His anticoagulation was discontinued by Dr. Julieta Gutting.  His wife has been very concerned about the risk of intracranial hemorrhage given his AVM surgery as well as a subsequent bleed in 2016.  Thromboembolic risk factors , HTN-1, Vasc disease -1) for a CHADSVASc Score of 2    DATE TEST EF   11/17 LHC  D1:95>Stent>0; RCA:p-m-30-60%; RCA:d-99 OM2:40.  5/20 Echo   55-65 % LAE mild         MI/DES to Cx 2009 with staged PCI of LAD with DES and distal RCA with BMS. b. NSTEMI 12/2015 s/p DES to D1    Past Medical History:  Diagnosis Date  . CAD (coronary artery disease)    a. MI/DES to Cx 2009 with staged PCI of LAD  with DES and distal RCA with BMS. b. NSTEMI 12/2015 s/p DES to D1, known occlusion of dRCA tx medically.  . Chronic lower back pain    Still with recurrent left sided LBP with paresthesias down left leg --primarily with activities: pain pill use is avg <90 tabs per mo  . COPD (chronic obstructive pulmonary disease) ([place])    NOTED on CT chest 11/2013 done for lung ca screening.  Hosp for acute exac 11/2014.  Spirometry not supportive of this dx as of pulm eval 2017, though.  Spireva trial by Dr. Elsworth Soho 02/2015.  Marland Kitchen GERD (gastroesophageal reflux disease)   . Heart attack ([place]) 12/12/2015  . History of adenomatous polyp of colon 02/2009;02/2014   Recall 5 yrs ([facility])  . History of kidney stones   . HTN (hypertension)   . Hyperlipidemia   . Intracranial hemorrhage ([place]) 12/27/14   small acute hemorrhage in gliotic medial R frontal lobe  . Memory loss   . MI (myocardial infarction) ([place]) 2009   "just had arm pain; never any chest pain"  . Migraine syndrome 10/07/2011    a couple per month usually, but worsening 2017 so Dr. Krista Blue adjusted med  . 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 Seizures ([place])    last one was fall of 2016.  Complex partial sz d/o.  Marland Kitchen Short-term memory loss 10/07/2011   "post brain OR &  from his seizures"--worsening 2017, Dr. Krista Blue in neurology doing w/u.  Marland Kitchen Stroke ([place]) 12/2014  . Tobacco abuse   . Urethral stricture since 1981   Recurrent dilation has been required [facility] urology)    Past Surgical History:  Procedure Laterality Date  . BACK SURGERY  11/25/2015  . CARDIAC CATHETERIZATION  2011   [place], records unavailable: pt reports "normal".  . CARDIAC CATHETERIZATION N/A 12/15/2015   Procedure: Left Heart Cath and Coronary Angiography;  Surgeon: Peter M Martinique, MD;  Location: [facility];  Service: Cardiovascular;  Laterality: N/A;  . CARDIAC CATHETERIZATION N/A 12/15/2015   Procedure: Coronary Stent Intervention;  Surgeon: Peter M  Martinique, MD;  Location: [place] [facility];  Service: Cardiovascular;  Laterality: N/A;  . CARDIOVERSION N/A 06/21/2018   Procedure: CARDIOVERSION;  Surgeon: Acie Fredrickson Wonda Cheng, MD;  Location: [facility] ENDOSCOPY;  Service: Cardiovascular;  Laterality: N/A;  . COLONOSCOPY W/ POLYPECTOMY  02/2009;02/2014   Recall 5 yr (aden poly, hyperplast polyp, int and ext hemorr).  Next recall is 02/2019.  Marland Kitchen CORONARY ANGIOPLASTY WITH STENT PLACEMENT  2009   3 DES to LCx; still with known distal RCA/PDA occlusion.  EF 60% by LV-gram.  . CRANIOTOMY FOR AVM  1981   Dx'd after pt began having seizures.  . CT ANGIOGRAM (CEREBRAL)  12/31/14   NORMAL--plan per neuro is to repeat this in 3 mo.  Pt states it was repeated Spring 2017 and was "fine"  . CYSTOSCOPY WITH URETHRAL DILATATION N/A 03/27/2019   Procedure: CYSTOSCOPY WITH URETHRAL DILATATION;  Surgeon: Irine Seal, MD;  Location: [facility];  Service: Urology;  Laterality: N/A;  . LUMBAR [place] SURGERY  2008; 2009   Dr. Joya Salm  . LUMBAR LAMINECTOMY/DECOMPRESSION MICRODISCECTOMY N/A 11/25/2015   Procedure: Lumbar three- four Lumbar four- five Laminectomy/Foraminotomy;  Surgeon: Leeroy Cha, MD;  Location: [facility];  Service: Neurosurgery;  Laterality: N/A;  L3-4 L4-5 Laminectomy/Foraminotomy/possible Lumbar Drain  . TEE WITHOUT CARDIOVERSION N/A 06/21/2018   Procedure: TRANSESOPHAGEAL ECHOCARDIOGRAM (TEE);  Surgeon: Acie Fredrickson Wonda Cheng, MD;  Location: [facility] ENDOSCOPY;  Service: Cardiovascular;  Laterality: N/A;  . TONSILLECTOMY     "I was little"  . TRANSTHORACIC ECHOCARDIOGRAM  12/07/14   Normal LV size/fxn, EF 60%, normal valves.  Marland Kitchen URETEROSCOPY WITH HOLMIUM LASER LITHOTRIPSY Left 03/27/2019   Procedure: cysto with dilation of uretheral sticture, ureteroscopy and left retrograde pyelogram.;  Surgeon: Irine Seal, MD;  Location: [facility];  Service: Urology;  Laterality: Left;     Prior to Admission medications   Medication Sig Start Date End Date Taking? Authorizing Provider   acetaminophen (TYLENOL) 500 MG tablet Take 1 tablet (500 mg total) by mouth every 6 (six) hours as needed for headache. Patient taking differently: Take 1,000 mg by mouth every 6 (six) hours as needed for headache.  12/16/15  Yes Cheryln Manly, NP  albuterol (PROVENTIL HFA;VENTOLIN HFA) 108 (90 BASE) MCG/ACT inhaler Inhale 2 puffs into the lungs every 6 (six) hours as needed for wheezing or shortness of breath. 12/08/14  Yes Reyne Dumas, MD  aspirin EC 81 MG tablet Take 81 mg by mouth at bedtime.    Yes [provider]  Cholecalciferol (VITAMIN D3) 50 MCG (2000 UT) TABS Take 2,000 Units by mouth daily.   Yes [provider]  docusate sodium (COLACE) 100 MG capsule Take 100 mg by mouth 2 (two) times daily.    Yes [provider]  famotidine (PEPCID) 20 MG tablet Take 20 mg by mouth daily before breakfast.  Yes [provider]  furosemide (LASIX) 20 MG tablet Take 20 mg by mouth 2 (two) times daily. 12/25/19  Yes [provider]  HYDROcodone-acetaminophen (NORCO) 10-325 MG per tablet Take 1 tablet by mouth every 8 (eight) hours as needed (for pain).  08/07/14  Yes [provider]  metoprolol tartrate (LOPRESSOR) 25 MG tablet Take 25 mg by mouth 2 (two) times daily.   Yes [provider]  nitroGLYCERIN (NITROSTAT) 0.4 MG SL tablet Place 1 tablet (0.4 mg total) under the tongue every 5 (five) minutes as needed for chest pain (CP or SOB). Patient taking differently: Place 0.4 mg under the tongue every 5 (five) minutes as needed for chest pain (OR shortness of breath).  12/16/15 01/10/21 Yes Jettie Booze, MD  oxcarbazepine (TRILEPTAL) 600 MG tablet TAKE ONE TABLET BY MOUTH TWICE DAILY Patient taking differently: Take 600 mg by mouth 2 (two) times daily.  06/23/16  Yes Marcial Pacas, MD  rosuvastatin (CRESTOR) 40 MG tablet Take 1 tablet (40 mg total) by mouth daily. Patient taking differently: Take 40 mg by mouth every other day.   07/25/17  Yes Jettie Booze, MD  diltiazem (CARDIZEM CD) 180 MG 24 hr capsule Take 1 capsule (180 mg total) by mouth daily. Patient not taking: Reported on 01/11/2020 06/21/18 01/11/20  Florencia Reasons, MD    Inpatient Medications: Scheduled Meds: . aspirin EC  81 mg Oral QHS  . famotidine  20 mg Oral QAC breakfast  . furosemide  20 mg Oral BID  . metoprolol tartrate  25 mg Oral BID  . oxcarbazepine  600 mg Oral BID  . rosuvastatin  40 mg Oral QODAY  . sodium chloride flush  3 mL Intravenous Q12H   Continuous Infusions: . diltiazem (CARDIZEM) infusion 5 mg/hr (01/12/20 0713)  . heparin 1,700 Units/hr (01/12/20 0520)   PRN Meds: acetaminophen **OR** acetaminophen, albuterol, HYDROcodone-acetaminophen, polyethylene glycol  Allergies:    Allergies  Allergen Reactions  . Brilinta [Ticagrelor] Other (See Comments)    HISTORY OF A BRAIN BLEED- was told by an MD to not take   . Tramadol Other (See Comments)    SEIZURES and "Staring off spells"  . Atorvastatin Other (See Comments)    MYALGIAS  . Ezetimibe Other (See Comments)    Muscle pain  . Morphine And Related Nausea And Vomiting    Social History:   Social History   Socioeconomic History  . Marital status: Married    Spouse name: [name]  . Number of children: 2  . Years of education: 10 th  . Highest education level: Not on file  Occupational History  . Occupation: retired     Fish farm manager: RETIRED    Comment: retired  Tobacco Use  . Smoking status: Former Smoker    Packs/day: 3.00    Years: 27.00    Pack years: 81.00    Types: Cigarettes    Quit date: 02/08/2001    Years since quitting: 18.9  . Smokeless tobacco: Never Used  Vaping Use  . Vaping Use: Never used  Substance and Sexual Activity  . Alcohol use: Yes    Alcohol/week: 7.0 standard drinks    Types: 7 Cans of beer per week    Comment: 12 oz beer   . Drug use: No  . Sexual activity: Yes  Other Topics Concern  . Not on file  Social History Narrative    Patient lives at home with his wife [name]).   Two adult children.   Retired  from [employer] approx age 44 due to medical reasons (disabled due to memory issues, seizure d/o and chronic LBP).   Education 10th grade.   Right handed.   Caffeine two cups of tea and two cups of coffee.   Tobacco 45 pack-yr hx, quit approx age 30.  Alcohol: 1-2 beers per night.     No hx of alc or drug problems.   No exercise.   Diet: regular   Social Determinants of Health   Financial Resource Strain:   . Difficulty of Paying Living Expenses: Not on file  Food Insecurity:   . Worried About Charity fundraiser in the Last Year: Not on file  . Ran Out of Food in the Last Year: Not on file  Transportation Needs:   . Lack of Transportation (Medical): Not on file  . Lack of Transportation (Non-Medical): Not on file  Physical Activity:   . Days of Exercise per Week: Not on file  . Minutes of Exercise per Session: Not on file  Stress:   . Feeling of Stress : Not on file  Social Connections:   . Frequency of Communication with Friends and Family: Not on file  . Frequency of Social Gatherings with Friends and Family: Not on file  . Attends Religious Services: Not on file  . Active Member of Clubs or Organizations: Not on file  . Attends Archivist Meetings: Not on file  . Marital Status: Not on file  Intimate Partner Violence:   . Fear of Current or Ex-Partner: Not on file  . Emotionally Abused: Not on file  . Physically Abused: Not on file  . Sexually Abused: Not on file    Family History:   Family History  Problem Relation Age of Onset  . Hyperlipidemia Mother   . Cancer Father        Mesothelioma  . Hypertension Father   . Asthma Father   . Heart attack Paternal Uncle        In 37s too  . Stroke Neg Hx    Family Status:  Family Status  Relation Name Status  . Mother  Alive  . Father  Deceased at age 57  . Annamarie Major  (Not Specified)  . Neg Hx  (Not Specified)    ROS:  Please  see the history of present illness.  All other ROS reviewed and negative.     Physical Exam/Data:   Vitals:   01/12/20 1315 01/12/20 1330 01/12/20 1345 01/12/20 1400  BP: 111/64 114/87 109/64 111/68  Pulse: 76 77 71 95  Resp:   16   Temp:    97.6 F (36.4 C)  TempSrc:    Axillary  SpO2: 95% 93% 95% 93%  Weight:      Height:        Intake/Output Summary (Last 24 hours) at 01/12/2020 1421 Last data filed at 01/12/2020 1300 Gross per 24 hour  Intake --  Output 1475 ml  Net -1475 ml    Last 3 Weights 01/11/2020 03/27/2019 03/26/2019  Weight (lbs) 250 lb 255 lb 8 oz 255 lb 8 oz  Weight (kg) 113.399 kg 115.894 kg 115.894 kg     Body mass index is 34.87 kg/m.   General:  Well nourished, well developed, male in no acute distress HEENT: normal Lymph: no adenopathy Neck: JVD - 8-10 Endocrine:  No thryomegaly Vascular: No carotid bruits; 4/4 extremity pulses 2+  Cardiac:Irregularly irregular rate and rhythm*; no murmur Lungs:  clear bilaterally, no  wheezing, rhonchi or rales  Abd: soft, nontender, no hepatomegaly  Ext: 1+ edema Musculoskeletal:  No deformities, BUE and BLE strength normal and equal Skin: warm and dry  Neuro:  CNs 2-12 intact, no focal abnormalities noted Psych:  Normal affect   EKG:  The EKG was personally reviewed and demonstrates:  12/03 ECG is atrial flutter, HR 132, inc LBBB w/ QRS duration 118 ms Telemetry:  Telemetry was personally reviewed and demonstrates:  Atrial flutter ECG 5/20 demonstrates atrial fibrillation   CV studies:   ECHO: 06/20/2018 1. Left atrial size was mildly dilated.  2. The mitral valve is grossly normal.  3. The tricuspid valve is grossly normal.  4. The aortic valve is tricuspid. Mild thickening of the aortic valve.  Aortic valve regurgitation is trivial by color flow Doppler. No stenosis  of the aortic valve.  5. Normal LV systolic function; sclerotic aortic valve with trace AI.   TEE: 06/21/2018 1. No evidence of a  thrombus present in the left atrial appendage.  2. The aortic root and ascending aorta are normal in size and structure.  3. There is evidence of mild plaque in the descending aorta.  4. The patient was successfully cardioverted back to NSR following the  TEE.  5. The left ventricle has normal systolic function, with an ejection  fraction of 55-60%.    Laboratory Data:   Chemistry Recent Labs  Lab 01/11/20 1521 01/12/20 0352  NA 139 138  K 3.7 3.6  CL 107 105  CO2 17* 22  GLUCOSE 96 137*  BUN 12 10  CREATININE 0.87 0.86  CALCIUM 9.0 9.3  GFRNONAA >60 >60  ANIONGAP 15 11    Lab Results  Component Value Date   ALT 28 06/21/2018   AST 21 06/21/2018   ALKPHOS 98 06/21/2018   BILITOT 0.9 06/21/2018   Hematology Recent Labs  Lab 01/11/20 1521 01/12/20 0352  WBC 9.2 10.0  RBC 5.74 5.58  HGB 17.0 16.5  HCT 54.3* 50.6  MCV 94.6 90.7  MCH 29.6 29.6  MCHC 31.3 32.6  RDW 13.3 13.4  PLT 162 185   Cardiac Enzymes High Sensitivity Troponin:   Recent Labs  Lab 01/11/20 1521 01/11/20 1811 01/11/20 2133  TROPONINIHS 27* 39* 38*      BNP Recent Labs  Lab 01/11/20 1745  BNP 136.0*    DDimer No results for input(s): DDIMER in the last 168 hours. TSH:  Lab Results  Component Value Date   TSH 1.332 06/20/2018   Lipids: Lab Results  Component Value Date   CHOL 149 02/06/2016   HDL 43 02/06/2016   LDLCALC 86 02/06/2016   TRIG 99 02/06/2016   CHOLHDL 3.5 02/06/2016   HgbA1c: Lab Results  Component Value Date   HGBA1C 5.6 12/12/2015   Magnesium:  Magnesium  Date Value Ref Range Status  06/20/2018 2.1 1.7 - 2.4 mg/dL Final    Comment:    Performed at [facility], [place] [address] [place], [place] [address]     Radiology/Studies:  DG Chest 2 View  Result Date: 01/11/2020 CLINICAL DATA:  Chest pain. EXAM: CHEST - 2 VIEW COMPARISON:  Jun 20, 2018 FINDINGS: The heart, hila, and mediastinum are normal. No pneumothorax. No nodules or masses. No  focal infiltrates. IMPRESSION: No active cardiopulmonary disease. Electronically Signed   By: Dorise Bullion III M.D   On: 01/11/2020 16:02   CT Angio Chest PE W/Cm &/Or Wo Cm  Result Date: 01/11/2020 CLINICAL DATA:  Shortness of breath.  Fall. EXAM: CT ANGIOGRAPHY CHEST WITH CONTRAST TECHNIQUE: Multidetector CT imaging of the chest was performed using the standard protocol during bolus administration of intravenous contrast. Multiplanar CT image reconstructions and MIPs were obtained to evaluate the vascular anatomy. CONTRAST:  74mL OMNIPAQUE IOHEXOL 350 MG/ML SOLN COMPARISON:  Chest CT 12/20/2014. FINDINGS: Cardiovascular: The heart size is normal. No substantial pericardial effusion. Coronary artery calcification is evident. No thoracic aortic aneurysm. There is no large central pulmonary embolus. No lobar or segmental embolus. A tiny nonocclusive subsegmental pulmonary embolus is identified in the left upper lobe (axial image 179 of series 7 and best demonstrated on sagittal images 128-131 of series 9). No other findings of acute pulmonary embolus. Mediastinum/Nodes: No mediastinal lymphadenopathy. There is no hilar lymphadenopathy. The esophagus has normal imaging features. There is no axillary lymphadenopathy. Lungs/Pleura: Centrilobular and paraseptal emphysema evident. No evidence for pneumothorax or pleural effusion. 5 mm right pulmonary nodule on 69/6 is stable since prior study consistent with benign etiology. Tiny perifissural nodule in the right lower lobe on 70/6 is also stable. No focal airspace consolidation. Upper Abdomen: 15 mm right adrenal nodule has average attenuation of -3 Hounsfield units, consistent with adenoma. Musculoskeletal: No worrisome lytic or sclerotic osseous abnormality. Review of the MIP images confirms the above findings. IMPRESSION: 1. Tiny nonocclusive subsegmental pulmonary embolus in the left upper lobe. No other acute pulmonary embolus on the current exam. 2. Tiny  right lung nodule stable since 2016 consistent with benign etiology. 3. 15 mm right adrenal adenoma. 4. Emphysema (ICD10-J43.9). Electronically Signed   By: Misty Stanley M.D.   On: 01/11/2020 20:11    Assessment and Plan:   1. Atrial flutter - SBP 100s at times, HR controlled on Lopressor 25 mg bid and Cardizem 5 mg/hr IV - MD advise on changing the Cardizem to 120 mg CD, d/c IV rx, continue BB - See anticoag info below - if he had another TEE/DCCV, could take Eliquis for a month and stop - suspect he will benefit from AAD, discuss w/ MD   2. PE - prev not on anticoag 2nd hx CVA/ICH (small) 2016 but has AVMs - at last TEE/DCCV 06/2018, pt to take Eliquis for a month and discuss w/ Dr Mauricio Po  - Dr Mauricio Po saw him 10/09/2018 and said no chronic anticoag 2nd hx ICH 2nd AVMs - CHA2DS2-VASc = 4 (CVA/PE x 2, CAD, HTN) This patients CHA2DS2-VASc Score and unadjusted Ischemic Stroke Rate (% per year) is equal to 4.8 % stroke rate/year from a score of 4 Above score calculated as 1 point each if present [CHF, HTN, DM, Vascular=MI/PAD/Aortic Plaque, Age if 65-74, or Male], 2 points each if present [Age > 75, or Stroke/TIA/TE] - may need Neurology input on risks of anticoagulation       Principal Problem:   Atrial fibrillation with RVR ([place]) Active Problems:   Seizure ([place])   HTN (hypertension), benign   Hyperlipidemia   Coronary artery disease involving native coronary artery of native heart without angina pectoris   Breath shortness   COPD (chronic obstructive pulmonary disease) ([place])   GERD (gastroesophageal reflux disease)   Essential hypertension   Personal history of arterial venous malformation (AVM)   S/P coronary artery stent placement   H/O intracranial hemorrhage     For questions or updates, please contact [facility] Please consult [email or web address] for contact info under Cardiology/STEMI.   SignedRosaria Ferries, PA-C  01/12/2020 2:21 PM  Atrial  fibrillation/flutter  Congestive heart  failure-acute/chronic-diastolic  AVM with prior intracranial surgery as well as subsequent hemorrhage  Coronary artery disease with prior stenting  Hypertension  Sleep disordered breathing   The patient presents with recurrent atrial arrhythmias, this time atrial flutter previously atrial fibrillation.  It is precipitated acute congestive heart failure presumably diastolic.  Restoration of sinus rhythm and protection from thromboembolism or VT therapies for his atrial arrhythmias, and these we anticipate would impact his congestive heart failure.  There were multiple discussions in 2020 regarding anticoagulation had with his neurologist at [facility].  The patient and particularly his wife remain very concerned about the risks of anticoagulation.  In this 59 year old man the decision to have discontinued his anticoagulation and treated him with aspirin is a very reasonable one.  To restore sinus rhythm however, we will need to resume anticoagulation for some period of time.  Holding his aspirin in the interim makes the most sense to minimize bleeding risks.  It would be reasonable also to consider watchman and its adjunctive aspirin as perhaps the ideal way to get between a rock and a hard place of thromboembolic risk and intracranial hemorrhage risk.  I will have Dr. Quentin Ore see the patient on Monday.  He also has significant sleep apnea.  Multiple recommendations have been made for sleep study.  He has been disinclined.  He is more inclined today.  His relationship with atrial fibrillation and hypertension have been stressed  In the interim, we will 1, continue anticoagulation and transition from heparin to Eliquis 2, continue rate control in anticipation of TEE guided cardioversion 3, IV Lasix 4, 2D echo 5, outpatient sleep study

## 2020-01-12 NOTE — ED Notes (Signed)
Cardizem drip discontinued d/t pt hr in the 50's, attending made aware of patient status changes.

## 2020-01-12 NOTE — ED Notes (Signed)
Breakfast Ordered 

## 2020-01-12 NOTE — Progress Notes (Addendum)
PROGRESS NOTE    Peter Barnett  XBM:[id number] DOB: 04/24/1960 DOA: 01/11/2020 PCP: [name], MD    Brief Narrative:  This 59 years old male with medical history significant for atrial fibrillation, COPD, CAD s/p stents, hypertension, hyperlipidemia, GERD, history of AV malformation and prior intracranial hemorrhage, seizures who presents with progressive shortness of breath associated with palpitation for 1 day.  Patient recently went to a trip to [place] and arrived home on the day of admission.  He was found to have elevated troponins,  CTA shows small subsegmental PE.  Patient was started on heparin and Cardizem drip for A. fib and PE.  Cardiology was consulted,  awaiting recommendation.    Assessment & Plan:   Principal Problem:   Atrial fibrillation with RVR (HCC) Active Problems:   Seizure ([place])   HTN (hypertension), benign   Hyperlipidemia   Coronary artery disease involving native coronary artery of native heart without angina pectoris   Breath shortness   COPD (chronic obstructive pulmonary disease) (HCC)   GERD (gastroesophageal reflux disease)   Essential hypertension   Personal history of arterial venous malformation (AVM)   S/P coronary artery stent placement   H/O intracranial hemorrhage    Atrial flutter with RVR: He has about 1 year history of A. fib,  He was discharged with 1 month of Eliquis after new onset A. fib and on follow-up switched to aspirin alone as he has a history of intracranial bleed secondary to AVMs in 2016. Patient just returned from a trip to [place] He has developed 3 days of shortness of breath and 1 day palpitations PE likely contributing trigger A. Fib with RVR Troponin trending down, flat could be due to Afib with RVR. Cardiology consulted , awaiting recommendation. We will likely need input from cardiology and/or neuro on anticoagulation plans Continue diltiazem drip Continue heparin drip Continue home metoprolol  Follow-up DVT study  Small subsegmental PE: Patient is on heparin gtt for now. Discussed with neurology states if patient had AV malformation which was clipped then patient can continue anticoagulation.  Hypertension :  Continue metoprolol  Hyperlipidemia:  Continue Lipitor  CAD status post stent x3 Continue home metoprolol, Lasix, rosuvastatin, aspirin  COPD - Continue home albuterol  GERD - Continue home Pepcid  Seizure - Continue home Trileptal  Chronic back pain - Continue home Norco  History of intracranial bleed secondary to AVMs in 2016 > No evidence of residual or recurrent AVMs on angiogram on 12/31/2014.     DVT prophylaxis: Heparin gtt. Code Status: Full code Family Communication: No one at bedside Disposition Plan: Status is: Observation  The patient remains OBS appropriate and will d/c before 2 midnights.  Dispo: The patient is from: Home              Anticipated d/c is to: Home              Anticipated d/c date is: 2 days              Patient currently is not medically stable to d/c.   Consultants:   Cardiology  Procedures: None Antimicrobials:   Anti-infectives (From admission, onward)   None      Subjective: Patient was seen and examined at bedside.  Overnight events noted.  Patient reports feeling better. Patient denies any chest pain but reports exertional shortness of breath.  Objective: Vitals:   01/12/20 1330 01/12/20 1345 01/12/20 1400 01/12/20 1448  BP: 114/87 109/64 111/68 (!) 130/100  Pulse:  77 71 95   Resp:  16  20  Temp:   97.6 F (36.4 C) 97.7 F (36.5 C)  TempSrc:   Axillary Oral  SpO2: 93% 95% 93%   Weight:    116.8 kg  Height:    5\' 11"  (1.803 m)    Intake/Output Summary (Last 24 hours) at 01/12/2020 1558 Last data filed at 01/12/2020 1300 Gross per 24 hour  Intake -  Output 1475 ml  Net -1475 ml   Filed Weights   01/11/20 1519 01/12/20 1448  Weight: 113.4 kg 116.8 kg    Examination:   General exam: Appears calm and comfortable , not in any distress Respiratory system: Clear to auscultation. Respiratory effort normal. Cardiovascular system: S1 & S2 heard, irregular rhythm no JVD, murmurs, rubs, gallops or clicks. No pedal edema. Gastrointestinal system: Abdomen is nondistended, soft and nontender. No organomegaly or masses felt. Normal bowel sounds heard. Central nervous system: Alert and oriented. No focal neurological deficits. Extremities: Symmetric 5 x 5 power. Skin: No rashes, lesions or ulcers Psychiatry: Judgement and insight appear normal. Mood & affect appropriate.     Data Reviewed: I have personally reviewed following labs and imaging studies  CBC: Recent Labs  Lab 01/11/20 1521 01/12/20 0352  WBC 9.2 10.0  HGB 17.0 16.5  HCT 54.3* 50.6  MCV 94.6 90.7  PLT 162 937   Basic Metabolic Panel: Recent Labs  Lab 01/11/20 1521 01/12/20 0352  NA 139 138  K 3.7 3.6  CL 107 105  CO2 17* 22  GLUCOSE 96 137*  BUN 12 10  CREATININE 0.87 0.86  CALCIUM 9.0 9.3   GFR: Estimated Creatinine Clearance: 118.7 mL/min (by C-G formula based on SCr of 0.86 mg/dL). Liver Function Tests: No results for input(s): AST, ALT, ALKPHOS, BILITOT, PROT, ALBUMIN in the last 168 hours. No results for input(s): LIPASE, AMYLASE in the last 168 hours. No results for input(s): AMMONIA in the last 168 hours. Coagulation Profile: No results for input(s): INR, PROTIME in the last 168 hours. Cardiac Enzymes: No results for input(s): CKTOTAL, CKMB, CKMBINDEX, TROPONINI in the last 168 hours. BNP (last 3 results) No results for input(s): PROBNP in the last 8760 hours. HbA1C: No results for input(s): HGBA1C in the last 72 hours. CBG: No results for input(s): GLUCAP in the last 168 hours. Lipid Profile: No results for input(s): CHOL, HDL, LDLCALC, TRIG, CHOLHDL, LDLDIRECT in the last 72 hours. Thyroid Function Tests: No results for input(s): TSH, T4TOTAL, FREET4, T3FREE,  THYROIDAB in the last 72 hours. Anemia Panel: No results for input(s): VITAMINB12, FOLATE, FERRITIN, TIBC, IRON, RETICCTPCT in the last 72 hours. Sepsis Labs: No results for input(s): PROCALCITON, LATICACIDVEN in the last 168 hours.  Recent Results (from the past 240 hour(s))  Resp Panel by RT-PCR (Flu A&B, Covid) Nasopharyngeal Swab     Status: None   Collection Time: 01/11/20 10:12 PM   Specimen: Nasopharyngeal Swab; Nasopharyngeal(NP) swabs in vial transport medium  Result Value Ref Range Status   SARS Coronavirus 2 by RT PCR NEGATIVE NEGATIVE Final    Comment: (NOTE) SARS-CoV-2 target nucleic acids are NOT DETECTED.  The SARS-CoV-2 RNA is generally detectable in upper respiratory specimens during the acute phase of infection. The lowest concentration of SARS-CoV-2 viral copies this assay can detect is 138 copies/mL. A negative result does not preclude SARS-Cov-2 infection and should not be used as the sole basis for treatment or other patient management decisions. A negative result may occur with  improper specimen  collection/handling, submission of specimen other than nasopharyngeal swab, presence of viral mutation(s) within the areas targeted by this assay, and inadequate number of viral copies(<138 copies/mL). A negative result must be combined with clinical observations, patient history, and epidemiological information. The expected result is Negative.  Fact Sheet for Patients:  EntrepreneurPulse.com.au  Fact Sheet for Healthcare Providers:  IncredibleEmployment.be  This test is no t yet approved or cleared by the [place] FDA and  has been authorized for detection and/or diagnosis of SARS-CoV-2 by FDA under an Emergency Use Authorization (EUA). This EUA will remain  in effect (meaning this test can be used) for the duration of the COVID-19 declaration under Section 564(b)(1) of the Act, 21 U.S.C.section 360bbb-3(b)(1), unless the  authorization is terminated  or revoked sooner.       Influenza A by PCR NEGATIVE NEGATIVE Final   Influenza B by PCR NEGATIVE NEGATIVE Final    Comment: (NOTE) The Xpert Xpress SARS-CoV-2/FLU/RSV plus assay is intended as an aid in the diagnosis of influenza from Nasopharyngeal swab specimens and should not be used as a sole basis for treatment. Nasal washings and aspirates are unacceptable for Xpert Xpress SARS-CoV-2/FLU/RSV testing.  Fact Sheet for Patients: EntrepreneurPulse.com.au  Fact Sheet for Healthcare Providers: IncredibleEmployment.be  This test is not yet approved or cleared by the [place] FDA and has been authorized for detection and/or diagnosis of SARS-CoV-2 by FDA under an Emergency Use Authorization (EUA). This EUA will remain in effect (meaning this test can be used) for the duration of the COVID-19 declaration under Section 564(b)(1) of the Act, 21 U.S.C. section 360bbb-3(b)(1), unless the authorization is terminated or revoked.  Performed at [facility], [place] [address], [place], [place] [address]      Radiology Studies: DG Chest 2 View  Result Date: 01/11/2020 CLINICAL DATA:  Chest pain. EXAM: CHEST - 2 VIEW COMPARISON:  Jun 20, 2018 FINDINGS: The heart, hila, and mediastinum are normal. No pneumothorax. No nodules or masses. No focal infiltrates. IMPRESSION: No active cardiopulmonary disease. Electronically Signed   By: Dorise Bullion III M.D   On: 01/11/2020 16:02   CT Angio Chest PE W/Cm &/Or Wo Cm  Result Date: 01/11/2020 CLINICAL DATA:  Shortness of breath.  Fall. EXAM: CT ANGIOGRAPHY CHEST WITH CONTRAST TECHNIQUE: Multidetector CT imaging of the chest was performed using the standard protocol during bolus administration of intravenous contrast. Multiplanar CT image reconstructions and MIPs were obtained to evaluate the vascular anatomy. CONTRAST:  29mL OMNIPAQUE IOHEXOL 350 MG/ML SOLN COMPARISON:   Chest CT 12/20/2014. FINDINGS: Cardiovascular: The heart size is normal. No substantial pericardial effusion. Coronary artery calcification is evident. No thoracic aortic aneurysm. There is no large central pulmonary embolus. No lobar or segmental embolus. A tiny nonocclusive subsegmental pulmonary embolus is identified in the left upper lobe (axial image 179 of series 7 and best demonstrated on sagittal images 128-131 of series 9). No other findings of acute pulmonary embolus. Mediastinum/Nodes: No mediastinal lymphadenopathy. There is no hilar lymphadenopathy. The esophagus has normal imaging features. There is no axillary lymphadenopathy. Lungs/Pleura: Centrilobular and paraseptal emphysema evident. No evidence for pneumothorax or pleural effusion. 5 mm right pulmonary nodule on 69/6 is stable since prior study consistent with benign etiology. Tiny perifissural nodule in the right lower lobe on 70/6 is also stable. No focal airspace consolidation. Upper Abdomen: 15 mm right adrenal nodule has average attenuation of -3 Hounsfield units, consistent with adenoma. Musculoskeletal: No worrisome lytic or sclerotic osseous abnormality. Review of the  MIP images confirms the above findings. IMPRESSION: 1. Tiny nonocclusive subsegmental pulmonary embolus in the left upper lobe. No other acute pulmonary embolus on the current exam. 2. Tiny right lung nodule stable since 2016 consistent with benign etiology. 3. 15 mm right adrenal adenoma. 4. Emphysema (ICD10-J43.9). Electronically Signed   By: Misty Stanley M.D.   On: 01/11/2020 20:11   Scheduled Meds: . aspirin EC  81 mg Oral QHS  . famotidine  20 mg Oral QAC breakfast  . furosemide  20 mg Oral BID  . metoprolol tartrate  25 mg Oral BID  . oxcarbazepine  600 mg Oral BID  . rosuvastatin  40 mg Oral QODAY  . sodium chloride flush  3 mL Intravenous Q12H   Continuous Infusions: . diltiazem (CARDIZEM) infusion 5 mg/hr (01/12/20 0713)  . heparin 1,700 Units/hr  (01/12/20 0520)     LOS: 0 days    Time spent: 35 mins    PARDEEP KUMAR, MD [facility]   If 7PM-7AM, please contact night-coverage

## 2020-01-12 NOTE — ED Notes (Signed)
Cardizem restarted on patient d/t hr increase.

## 2020-01-12 NOTE — Plan of Care (Signed)
  Problem: Education: Goal: Knowledge of General Education information will improve Description: Including pain rating scale, medication(s)/side effects and non-pharmacologic comfort measures Outcome: Progressing   Problem: Health Behavior/Discharge Planning: Goal: Ability to manage health-related needs will improve Outcome: Progressing   Problem: Clinical Measurements: Goal: Ability to maintain clinical measurements within normal limits will improve Outcome: Progressing Goal: Will remain free from infection Outcome: Progressing Goal: Respiratory complications will improve Outcome: Progressing Goal: Cardiovascular complication will be avoided Outcome: Progressing   Problem: Activity: Goal: Risk for activity intolerance will decrease Outcome: Progressing   Problem: Nutrition: Goal: Adequate nutrition will be maintained Outcome: Progressing   Problem: Coping: Goal: Level of anxiety will decrease Outcome: Progressing   Problem: Elimination: Goal: Will not experience complications related to bowel motility Outcome: Progressing Goal: Will not experience complications related to urinary retention Outcome: Progressing   Problem: Pain Managment: Goal: General experience of comfort will improve Outcome: Progressing   Problem: Safety: Goal: Ability to remain free from injury will improve Outcome: Progressing   Problem: Education: Goal: Knowledge of disease or condition will improve Outcome: Progressing Goal: Understanding of medication regimen will improve Outcome: Progressing   Problem: Activity: Goal: Ability to tolerate increased activity will improve Outcome: Progressing   Problem: Cardiac: Goal: Ability to achieve and maintain adequate cardiopulmonary perfusion will improve Outcome: Progressing   Problem: Health Behavior/Discharge Planning: Goal: Ability to safely manage health-related needs after discharge will improve Outcome: Progressing

## 2020-01-12 NOTE — Progress Notes (Signed)
[place] for Heparin Indication: atrial fibrillation and pulmonary embolus  Allergies  Allergen Reactions  . Brilinta [Ticagrelor] Other (See Comments)    HISTORY OF A BRAIN BLEED- was told by an MD to not take   . Tramadol Other (See Comments)    SEIZURES and "Staring off spells"  . Atorvastatin Other (See Comments)    MYALGIAS  . Ezetimibe Other (See Comments)    Muscle pain  . Morphine And Related Nausea And Vomiting    Patient Measurements: Height: 5\' 11"  (180.3 cm) Weight: 113.4 kg (250 lb) IBW/kg (Calculated) : 75.3 Heparin Dosing Weight: 99.9 kg   Vital Signs: Temp: 97.8 F (36.6 C) (12/04 0900) Temp Source: Axillary (12/04 0900) BP: 120/72 (12/04 1245) Pulse Rate: 95 (12/04 1245)  Labs: Recent Labs    01/11/20 1521 01/11/20 1811 01/11/20 2133 01/12/20 0352 01/12/20 1141  HGB 17.0  --   --  16.5  --   HCT 54.3*  --   --  50.6  --   PLT 162  --   --  185  --   HEPARINUNFRC  --   --   --  <0.10* 0.31  CREATININE 0.87  --   --  0.86  --   TROPONINIHS 27* 39* 38*  --   --     Estimated Creatinine Clearance: 116.9 mL/min (by C-G formula based on SCr of 0.86 mg/dL).   Medical History: Past Medical History:  Diagnosis Date  . CAD (coronary artery disease)    a. MI/DES to Cx 2009 with staged PCI of LAD with DES and distal RCA with BMS. b. NSTEMI 12/2015 s/p DES to D1, known occlusion of dRCA tx medically.  . Chronic lower back pain    Still with recurrent left sided LBP with paresthesias down left leg --primarily with activities: pain pill use is avg <90 tabs per mo  . COPD (chronic obstructive pulmonary disease) ([place])    NOTED on CT chest 11/2013 done for lung ca screening.  Hosp for acute exac 11/2014.  Spirometry not supportive of this dx as of pulm eval 2017, though.  Spireva trial by Dr. Elsworth Soho 02/2015.  Marland Kitchen GERD (gastroesophageal reflux disease)   . Heart attack ([place]) 12/12/2015  . History of adenomatous polyp of colon  02/2009;02/2014   Recall 5 yrs ([facility])  . History of kidney stones   . HTN (hypertension)   . Hyperlipidemia   . Intracranial hemorrhage ([place]) 12/27/14   small acute hemorrhage in gliotic medial R frontal lobe  . Memory loss   . MI (myocardial infarction) ([place]) 2009   "just had arm pain; never any chest pain"  . Migraine syndrome 10/07/2011    a couple per month usually, but worsening 2017 so Dr. Krista Blue adjusted med  . 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 Seizures ([place])    last one was fall of 2016.  Complex partial sz d/o.  Marland Kitchen Short-term memory loss 10/07/2011   "post brain OR & from his seizures"--worsening 2017, Dr. Krista Blue in neurology doing w/u.  Marland Kitchen Stroke ([place]) 12/2014  . Tobacco abuse   . Urethral stricture since 1981   Recurrent dilation has been required [facility] urology)    Medications:  Scheduled:  . aspirin EC  81 mg Oral QHS  . famotidine  20 mg Oral QAC breakfast  . furosemide  20 mg Oral BID  . metoprolol tartrate  25 mg Oral BID  . oxcarbazepine  600 mg Oral BID  .  rosuvastatin  40 mg Oral QODAY  . sodium chloride flush  3 mL Intravenous Q12H    Assessment: Patient is a 62 yom that presents to the ED in afib with RVR. The patient has a hx of an [place] in 2016 and has been on anticoagulation since this. Patient still continues to have paroxsymal afib and also was fount to have a small subsegmental PE. Pharmacy has been asked to dose heparin at this time.  Goal of Therapy:  Heparin level 0.3-0.7 units/ml Monitor platelets by anticoagulation protocol: Yes   Plan:  - Heparin level therapeutic at 0.31  - Continue heparin drip @ 1700 units/hr  - Confirm heparin level in ~ 8 hours  - F/u plans for d/c and anticoagulation for home  - Monitor patient for s/s of bleeding and CBC while on heparin   Duanne Limerick PharmD. BCPS  01/12/2020,12:52 PM

## 2020-01-12 NOTE — Progress Notes (Signed)
[place] for Heparin to Eliquis Indication: atrial fibrillation and pulmonary embolus  Allergies  Allergen Reactions  . Brilinta [Ticagrelor] Other (See Comments)    HISTORY OF A BRAIN BLEED- was told by an MD to not take   . Tramadol Other (See Comments)    SEIZURES and "Staring off spells"  . Atorvastatin Other (See Comments)    MYALGIAS  . Ezetimibe Other (See Comments)    Muscle pain  . Morphine And Related Nausea And Vomiting    Patient Measurements: Height: 5\' 11"  (180.3 cm) Weight: 116.8 kg (257 lb 6.4 oz) IBW/kg (Calculated) : 75.3 Heparin Dosing Weight: 99.9 kg   Vital Signs: Temp: 97.7 F (36.5 C) (12/04 1448) Temp Source: Oral (12/04 1448) BP: 135/97 (12/04 1548) Pulse Rate: 95 (12/04 1400)  Labs: Recent Labs    01/11/20 1521 01/11/20 1811 01/11/20 2133 01/12/20 0352 01/12/20 1141  HGB 17.0  --   --  16.5  --   HCT 54.3*  --   --  50.6  --   PLT 162  --   --  185  --   HEPARINUNFRC  --   --   --  <0.10* 0.31  CREATININE 0.87  --   --  0.86  --   TROPONINIHS 27* 39* 38*  --   --     Estimated Creatinine Clearance: 118.7 mL/min (by C-G formula based on SCr of 0.86 mg/dL).   Medical History: Past Medical History:  Diagnosis Date  . CAD (coronary artery disease)    a. MI/DES to Cx 2009 with staged PCI of LAD with DES and distal RCA with BMS. b. NSTEMI 12/2015 s/p DES to D1, known occlusion of dRCA tx medically.  . Chronic lower back pain    Still with recurrent left sided LBP with paresthesias down left leg --primarily with activities: pain pill use is avg <90 tabs per mo  . COPD (chronic obstructive pulmonary disease) ([place])    NOTED on CT chest 11/2013 done for lung ca screening.  Hosp for acute exac 11/2014.  Spirometry not supportive of this dx as of pulm eval 2017, though.  Spireva trial by Dr. Elsworth Soho 02/2015.  Marland Kitchen GERD (gastroesophageal reflux disease)   . Heart attack ([place]) 12/12/2015  . History of adenomatous  polyp of colon 02/2009;02/2014   Recall 5 yrs ([facility])  . History of kidney stones   . HTN (hypertension)   . Hyperlipidemia   . Intracranial hemorrhage ([place]) 12/27/14   small acute hemorrhage in gliotic medial R frontal lobe  . Memory loss   . MI (myocardial infarction) ([place]) 2009   "just had arm pain; never any chest pain"  . Migraine syndrome 10/07/2011    a couple per month usually, but worsening 2017 so Dr. Krista Blue adjusted med  . 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 Seizures ([place])    last one was fall of 2016.  Complex partial sz d/o.  Marland Kitchen Short-term memory loss 10/07/2011   "post brain OR & from his seizures"--worsening 2017, Dr. Krista Blue in neurology doing w/u.  Marland Kitchen Stroke ([place]) 12/2014  . Tobacco abuse   . Urethral stricture since 1981   Recurrent dilation has been required [facility] urology)    Medications:  Scheduled:  . apixaban  10 mg Oral BID   Followed by  . [START ON 01/19/2020] apixaban  5 mg Oral BID  . famotidine  20 mg Oral QAC breakfast  . furosemide  20 mg Oral  BID  . metoprolol tartrate  25 mg Oral BID  . oxcarbazepine  600 mg Oral BID  . rosuvastatin  40 mg Oral QODAY  . sodium chloride flush  3 mL Intravenous Q12H    Assessment: Patient is a 78 yom that presents to the ED in afib with RVR. The patient has a hx of an [place] in 2016 and has been on anticoagulation since this. Patient still continues to have paroxsymal afib and also was fount to have a small subsegmental PE. Pharmacy has been asked to dose heparin at this time.  Switching to Eliquis this PM - > will give treatment dose for PE  Goal of Therapy:  Heparin level 0.3-0.7 units/ml Monitor platelets by anticoagulation protocol: Yes   Plan:  Eliquis 10 mg po BID x 7 days then 5 mg po BID  Tad Moore PharmD. 01/12/2020,6:01 PM

## 2020-01-12 NOTE — ED Notes (Signed)
Patient

## 2020-01-12 NOTE — Plan of Care (Signed)
Remote h/o Aneurysm s/p clipping. With a  Secured aneurysm, OK to anticoagulate for Afib given that the risk of an ischemic event without anticoag is much higher than any risk of bleeding of a secured aneurysm.  -- Amie Portland, MD [facility]

## 2020-01-13 ENCOUNTER — Observation Stay (HOSPITAL_COMMUNITY): Payer: Medicare HMO

## 2020-01-13 ENCOUNTER — Encounter (HOSPITAL_COMMUNITY): Payer: Self-pay | Admitting: Internal Medicine

## 2020-01-13 DIAGNOSIS — Z8774 Personal history of (corrected) congenital malformations of heart and circulatory system: Secondary | ICD-10-CM

## 2020-01-13 DIAGNOSIS — I4892 Unspecified atrial flutter: Secondary | ICD-10-CM | POA: Diagnosis not present

## 2020-01-13 DIAGNOSIS — G4730 Sleep apnea, unspecified: Secondary | ICD-10-CM | POA: Diagnosis present

## 2020-01-13 DIAGNOSIS — I110 Hypertensive heart disease with heart failure: Secondary | ICD-10-CM | POA: Diagnosis not present

## 2020-01-13 DIAGNOSIS — I4891 Unspecified atrial fibrillation: Secondary | ICD-10-CM

## 2020-01-13 DIAGNOSIS — M549 Dorsalgia, unspecified: Secondary | ICD-10-CM | POA: Diagnosis present

## 2020-01-13 DIAGNOSIS — Z8673 Personal history of transient ischemic attack (TIA), and cerebral infarction without residual deficits: Secondary | ICD-10-CM | POA: Diagnosis not present

## 2020-01-13 DIAGNOSIS — I2693 Single subsegmental pulmonary embolism without acute cor pulmonale: Secondary | ICD-10-CM | POA: Diagnosis not present

## 2020-01-13 DIAGNOSIS — I4819 Other persistent atrial fibrillation: Secondary | ICD-10-CM | POA: Diagnosis not present

## 2020-01-13 DIAGNOSIS — Z79899 Other long term (current) drug therapy: Secondary | ICD-10-CM | POA: Diagnosis not present

## 2020-01-13 DIAGNOSIS — Q211 Atrial septal defect: Secondary | ICD-10-CM | POA: Diagnosis not present

## 2020-01-13 DIAGNOSIS — I5031 Acute diastolic (congestive) heart failure: Secondary | ICD-10-CM | POA: Diagnosis not present

## 2020-01-13 DIAGNOSIS — Z20822 Contact with and (suspected) exposure to covid-19: Secondary | ICD-10-CM | POA: Diagnosis not present

## 2020-01-13 DIAGNOSIS — G40802 Other epilepsy, not intractable, without status epilepticus: Secondary | ICD-10-CM | POA: Diagnosis not present

## 2020-01-13 DIAGNOSIS — G8929 Other chronic pain: Secondary | ICD-10-CM | POA: Diagnosis present

## 2020-01-13 DIAGNOSIS — I252 Old myocardial infarction: Secondary | ICD-10-CM | POA: Diagnosis not present

## 2020-01-13 DIAGNOSIS — I10 Essential (primary) hypertension: Secondary | ICD-10-CM | POA: Diagnosis not present

## 2020-01-13 DIAGNOSIS — Z955 Presence of coronary angioplasty implant and graft: Secondary | ICD-10-CM | POA: Diagnosis not present

## 2020-01-13 DIAGNOSIS — M545 Low back pain, unspecified: Secondary | ICD-10-CM | POA: Diagnosis present

## 2020-01-13 DIAGNOSIS — Z87442 Personal history of urinary calculi: Secondary | ICD-10-CM | POA: Diagnosis not present

## 2020-01-13 DIAGNOSIS — E785 Hyperlipidemia, unspecified: Secondary | ICD-10-CM | POA: Diagnosis not present

## 2020-01-13 DIAGNOSIS — I2510 Atherosclerotic heart disease of native coronary artery without angina pectoris: Secondary | ICD-10-CM | POA: Diagnosis not present

## 2020-01-13 DIAGNOSIS — Z885 Allergy status to narcotic agent status: Secondary | ICD-10-CM | POA: Diagnosis not present

## 2020-01-13 DIAGNOSIS — J449 Chronic obstructive pulmonary disease, unspecified: Secondary | ICD-10-CM | POA: Diagnosis not present

## 2020-01-13 DIAGNOSIS — K219 Gastro-esophageal reflux disease without esophagitis: Secondary | ICD-10-CM | POA: Diagnosis present

## 2020-01-13 DIAGNOSIS — Z7982 Long term (current) use of aspirin: Secondary | ICD-10-CM | POA: Diagnosis not present

## 2020-01-13 DIAGNOSIS — E876 Hypokalemia: Secondary | ICD-10-CM | POA: Diagnosis not present

## 2020-01-13 DIAGNOSIS — Z87891 Personal history of nicotine dependence: Secondary | ICD-10-CM | POA: Diagnosis not present

## 2020-01-13 LAB — COMPREHENSIVE METABOLIC PANEL
ALT: 29 U/L (ref 0–44)
AST: 16 U/L (ref 15–41)
Albumin: 3.5 g/dL (ref 3.5–5.0)
Alkaline Phosphatase: 91 U/L (ref 38–126)
Anion gap: 10 (ref 5–15)
BUN: 9 mg/dL (ref 6–20)
CO2: 24 mmol/L (ref 22–32)
Calcium: 9.1 mg/dL (ref 8.9–10.3)
Chloride: 103 mmol/L (ref 98–111)
Creatinine, Ser: 0.82 mg/dL (ref 0.61–1.24)
GFR, Estimated: >60 mL/min (ref >60)
Glucose, Bld: 121 mg/dL — ABNORMAL HIGH (ref 70–99)
Potassium: 3.5 mmol/L (ref 3.5–5.1)
Sodium: 137 mmol/L (ref 135–145)
Total Bilirubin: 1 mg/dL (ref 0.3–1.2)
Total Protein: 6 g/dL — ABNORMAL LOW (ref 6.5–8.1)

## 2020-01-13 LAB — ECHOCARDIOGRAM COMPLETE
Height: 71 in
S' Lateral: 3.7 cm
Weight: 4062.4 oz

## 2020-01-13 LAB — PHOSPHORUS: Phosphorus: 3.4 mg/dL (ref 2.5–4.6)

## 2020-01-13 LAB — MAGNESIUM: Magnesium: 2.1 mg/dL (ref 1.7–2.4)

## 2020-01-13 MED ORDER — SODIUM CHLORIDE 0.9 % IV SOLN: INTRAVENOUS | Status: DC

## 2020-01-13 NOTE — Progress Notes (Signed)
Patient HR 50s afib on monitor. BP 98/67. Denies CP/dizziness.  Cardizem gtt IV stopped.  Cardiology paged, awaiting call back. Will continue to monitor.

## 2020-01-13 NOTE — Progress Notes (Signed)
Patient Profile:   Peter Barnett is a 59 y.o. male with a hx of CAD w/ DES CFX/LAD/RCA 2009, MI w/ DES D1 2017 w/ dRCA 100%, HLN, HLD, COPD, GERD, CVA w/ small ICH 2016, intracranial AVMs, Sz, with a history of atrial fibrillation 7/20 at which point he was anticoagulated and underwent TEE guided cardioversion with subsequent discontinuation of his anticoagulation and treatment with aspirin admitted 12/4 with recurrent atrial fibrillation associated with dyspnea on exertion. BP-low >> IV dilt stopped  Echo p   Patient Name: Peter Barnett      SUBJECTIVE: withoug complaints but wants to go home\ His wife tells me he has been diagnosed with dementia last fall ascribed to the previous brain traumas  She is a patient of Stephenie Acres at  [facility]  Past Medical History:  Diagnosis Date  . CAD (coronary artery disease)    a. MI/DES to Cx 2009 with staged PCI of LAD with DES and distal RCA with BMS. b. NSTEMI 12/2015 s/p DES to D1, known occlusion of dRCA tx medically.  . Chronic lower back pain    Still with recurrent left sided LBP with paresthesias down left leg --primarily with activities: pain pill use is avg <90 tabs per mo  . COPD (chronic obstructive pulmonary disease) ([place])    NOTED on CT chest 11/2013 done for lung ca screening.  Hosp for acute exac 11/2014.  Spirometry not supportive of this dx as of pulm eval 2017, though.  Spireva trial by Dr. Elsworth Soho 02/2015.  Marland Kitchen GERD (gastroesophageal reflux disease)   . Heart attack ([place]) 12/12/2015  . History of adenomatous polyp of colon 02/2009;02/2014   Recall 5 yrs ([facility])  . History of kidney stones   . HTN (hypertension)   . Hyperlipidemia   . Intracranial hemorrhage ([place]) 12/27/14   small acute hemorrhage in gliotic medial R frontal lobe  . Memory loss   . MI (myocardial infarction) ([place]) 2009   "just had arm pain; never any chest pain"  . Migraine syndrome 10/07/2011    a couple per month usually, but  worsening 2017 so Dr. Krista Blue adjusted med  . 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 Seizures ([place])    last one was fall of 2016.  Complex partial sz d/o.  Marland Kitchen Short-term memory loss 10/07/2011   "post brain OR & from his seizures"--worsening 2017, Dr. Krista Blue in neurology doing w/u.  Marland Kitchen Stroke ([place]) 12/2014  . Tobacco abuse   . Urethral stricture since 1981   Recurrent dilation has been required [facility])    Scheduled Meds:  Scheduled Meds: . apixaban  10 mg Oral BID   Followed by  . [START ON 01/19/2020] apixaban  5 mg Oral BID  . famotidine  20 mg Oral QAC breakfast  . furosemide  20 mg Oral BID  . metoprolol tartrate  25 mg Oral BID  . Oxcarbazepine  600 mg Oral BID  . rosuvastatin  40 mg Oral QODAY  . sodium chloride flush  3 mL Intravenous Q12H   Continuous Infusions: . diltiazem (CARDIZEM) infusion Stopped (01/13/20 1223)   acetaminophen **OR** acetaminophen, albuterol, HYDROcodone-acetaminophen, polyethylene glycol    PHYSICAL EXAM Vitals:   01/13/20 0627 01/13/20 0725 01/13/20 0856 01/13/20 1219  BP: (!) 125/92 113/83 133/79   Pulse:  68 66 (!) 54  Resp:  18 16 16   Temp:  97.9 F (36.6 C) 98.4 F (36.9 C)   TempSrc:  Oral Oral   SpO2:  Weight:      Height:       Well developed and nourished in no acute distress HENT normal Neck supple with JVP-flat Carotids brisk and full without bruits Clear Irregularly irregular rate and rhythm with controlled ventricular response, no murmurs or gallops Abd-soft with active BS without hepatomegaly No Clubbing cyanosis edema Skin-warm and dry A & Oriented  Grossly normal sensory and motor function  TELEMETRY: Reviewed personnally pt in AFib with progressively lower rates :     Intake/Output Summary (Last 24 hours) at 01/13/2020 1325 Last data filed at 01/13/2020 1100 Gross per 24 hour  Intake 1997.09 ml  Output 2250 ml  Net -252.91 ml    LABS: Basic Metabolic Panel: Recent Labs  Lab 01/11/20 1521  01/11/20 1521 01/12/20 0352 01/13/20 0336  NA 139  --  138 137  K 3.7  --  3.6 3.5  CL 107  --  105 103  CO2 17*  --  22 24  GLUCOSE 96  --  137* 121*  BUN 12  --  10 9  CREATININE 0.87  --  0.86 0.82  CALCIUM 9.0   < > 9.3 9.1  MG  --   --   --  2.1  PHOS  --   --   --  3.4   < > = values in this interval not displayed.   Cardiac Enzymes: No results for input(s): CKTOTAL, CKMB, CKMBINDEX, TROPONINI in the last 72 hours. CBC: Recent Labs  Lab 01/11/20 1521 01/12/20 0352  WBC 9.2 10.0  HGB 17.0 16.5  HCT 54.3* 50.6  MCV 94.6 90.7  PLT 162 185   PROTIME: No results for input(s): LABPROT, INR in the last 72 hours. Liver Function Tests: Recent Labs    01/13/20 0336  AST 16  ALT 29  ALKPHOS 91  BILITOT 1.0  PROT 6.0*  ALBUMIN 3.5   No results for input(s): LIPASE, AMYLASE in the last 72 hours. BNP: BNP (last 3 results) Recent Labs    01/11/20 1745  BNP 136.0*    ProBNP (last 3 results) No results for input(s): PROBNP in the last 8760 hours.  D-Dimer: No results for input(s): DDIMER in the last 72 hours. Hemoglobin A1C: No results for input(s): HGBA1C in the last 72 hours. Fasting Lipid Panel: No results for input(s): CHOL, HDL, LDLCALC, TRIG, CHOLHDL, LDLDIRECT in the last 72 hours. Thyroid Function Tests: No results for input(s): TSH, T4TOTAL, T3FREE, THYROIDAB in the last 72 hours.  Invalid input(s): FREET3 Anemia Panel: No results for input(s): VITAMINB12, FOLATE, FERRITIN, TIBC, IRON, RETICCTPCT in the last 72 hours.      ASSESSMENT AND PLAN: Atrial fibrillation/flutter  Congestive heart failure-acute/chronic-diastolic  AVM with prior intracranial surgery as well as subsequent hemorrhage  Coronary artery disease with prior stenting  Hypertension  Sleep disordered breathing  Lengthy disussions regarding plans and challenges .  In the short-term, restoration of sinus rhythm is indicated for symptoms.  We will plan TEE  cardioversion hopefully for tomorrow.  N.p.o. orders are written other orders are not.  He will need at least 4 weeks of anticoagulation following cardioversion.  In this interval, we will hold his aspirin.  Thereafter things become much more difficult.  First issue is the overall risk of stroke with his CHADS-VASc score of 2 related to hypertension and vascular disease.  A study for Toribio Harbour suggested that not all CHADS-VASc points are the same and these 2 are relatively modest in value.  Hence, given the  risks and concerns of intracranial bleeding, it is not clear to me that chronic anticoagulation is the right strategy.  Intermittent anticoagulation is a strategy that is being studied.  It would be reasonable to apply this to this gentleman.  He could use either an wearable or implantable technology to identify the presence of atrial fibrillation, begin anticoagulation at that time undergo cardioversion as soon as it is possible and then discontinue his anticoagulation 4 weeks later.  Another strategy would be to consider Watchman Left Atrial Appendage Occluder as single antiplatelet therapy has been used from its implant and he needs aspirin anyway for his coronary disease and stents.  Still another strategy would be to undergo catheter ablation of his atrial fibrillation substrate.  In many centers this is been associated, following verification of success, with the discontinuation of anticoagulation.  The patient's wife has been cared for by Dr[facility] Remus [facility] at [facility].  I have suggested that reviewing options with him may be helpful for them as a have to navigate between the rock and a hard place with the consequences being related to brain injury either by clot or by bleeding.  For now, we will continue the plan to anticoagulate for 4 weeks and TEE cardioversion.  Other decisions will be deferred   1323-1430 Signed, Virl Axe MD  01/13/2020

## 2020-01-13 NOTE — H&P (View-Only) (Signed)
Patient Profile:   Peter Barnett is a 59 y.o. male with a hx of CAD w/ DES CFX/LAD/RCA 2009, MI w/ DES D1 2017 w/ dRCA 100%, HLN, HLD, COPD, GERD, CVA w/ small ICH 2016, intracranial AVMs, Sz, with a history of atrial fibrillation 7/20 at which point he was anticoagulated and underwent TEE guided cardioversion with subsequent discontinuation of his anticoagulation and treatment with aspirin admitted 12/4 with recurrent atrial fibrillation associated with dyspnea on exertion. BP-low >> IV dilt stopped  Echo p   Patient Name: Peter Barnett      SUBJECTIVE: withoug complaints but wants to go home\ His wife tells me he has been diagnosed with dementia last fall ascribed to the previous brain traumas  She is a patient of Stephenie Acres at  [facility]  Past Medical History:  Diagnosis Date  . CAD (coronary artery disease)    a. MI/DES to Cx 2009 with staged PCI of LAD with DES and distal RCA with BMS. b. NSTEMI 12/2015 s/p DES to D1, known occlusion of dRCA tx medically.  . Chronic lower back pain    Still with recurrent left sided LBP with paresthesias down left leg --primarily with activities: pain pill use is avg <90 tabs per mo  . COPD (chronic obstructive pulmonary disease) ([place])    NOTED on CT chest 11/2013 done for lung ca screening.  Hosp for acute exac 11/2014.  Spirometry not supportive of this dx as of pulm eval 2017, though.  Spireva trial by Dr. Elsworth Soho 02/2015.  Marland Kitchen GERD (gastroesophageal reflux disease)   . Heart attack ([place]) 12/12/2015  . History of adenomatous polyp of colon 02/2009;02/2014   Recall 5 yrs ([facility])  . History of kidney stones   . HTN (hypertension)   . Hyperlipidemia   . Intracranial hemorrhage ([place]) 12/27/14   small acute hemorrhage in gliotic medial R frontal lobe  . Memory loss   . MI (myocardial infarction) ([place]) 2009   "just had arm pain; never any chest pain"  . Migraine syndrome 10/07/2011    a couple per month usually, but  worsening 2017 so Dr. Krista Blue adjusted med  . 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 Seizures ([place])    last one was fall of 2016.  Complex partial sz d/o.  Marland Kitchen Short-term memory loss 10/07/2011   "post brain OR & from his seizures"--worsening 2017, Dr. Krista Blue in neurology doing w/u.  Marland Kitchen Stroke ([place]) 12/2014  . Tobacco abuse   . Urethral stricture since 1981   Recurrent dilation has been required [facility] urology)    Scheduled Meds:  Scheduled Meds: . apixaban  10 mg Oral BID   Followed by  . [START ON 01/19/2020] apixaban  5 mg Oral BID  . famotidine  20 mg Oral QAC breakfast  . furosemide  20 mg Oral BID  . metoprolol tartrate  25 mg Oral BID  . Oxcarbazepine  600 mg Oral BID  . rosuvastatin  40 mg Oral QODAY  . sodium chloride flush  3 mL Intravenous Q12H   Continuous Infusions: . diltiazem (CARDIZEM) infusion Stopped (01/13/20 1223)   acetaminophen **OR** acetaminophen, albuterol, HYDROcodone-acetaminophen, polyethylene glycol    PHYSICAL EXAM Vitals:   01/13/20 0627 01/13/20 0725 01/13/20 0856 01/13/20 1219  BP: (!) 125/92 113/83 133/79   Pulse:  68 66 (!) 54  Resp:  18 16 16   Temp:  97.9 F (36.6 C) 98.4 F (36.9 C)   TempSrc:  Oral Oral   SpO2:  Weight:      Height:       Well developed and nourished in no acute distress HENT normal Neck supple with JVP-flat Carotids brisk and full without bruits Clear Irregularly irregular rate and rhythm with controlled ventricular response, no murmurs or gallops Abd-soft with active BS without hepatomegaly No Clubbing cyanosis edema Skin-warm and dry A & Oriented  Grossly normal sensory and motor function  TELEMETRY: Reviewed personnally pt in AFib with progressively lower rates :     Intake/Output Summary (Last 24 hours) at 01/13/2020 1325 Last data filed at 01/13/2020 1100 Gross per 24 hour  Intake 1997.09 ml  Output 2250 ml  Net -252.91 ml    LABS: Basic Metabolic Panel: Recent Labs  Lab 01/11/20 1521  01/11/20 1521 01/12/20 0352 01/13/20 0336  NA 139  --  138 137  K 3.7  --  3.6 3.5  CL 107  --  105 103  CO2 17*  --  22 24  GLUCOSE 96  --  137* 121*  BUN 12  --  10 9  CREATININE 0.87  --  0.86 0.82  CALCIUM 9.0   < > 9.3 9.1  MG  --   --   --  2.1  PHOS  --   --   --  3.4   < > = values in this interval not displayed.   Cardiac Enzymes: No results for input(s): CKTOTAL, CKMB, CKMBINDEX, TROPONINI in the last 72 hours. CBC: Recent Labs  Lab 01/11/20 1521 01/12/20 0352  WBC 9.2 10.0  HGB 17.0 16.5  HCT 54.3* 50.6  MCV 94.6 90.7  PLT 162 185   PROTIME: No results for input(s): LABPROT, INR in the last 72 hours. Liver Function Tests: Recent Labs    01/13/20 0336  AST 16  ALT 29  ALKPHOS 91  BILITOT 1.0  PROT 6.0*  ALBUMIN 3.5   No results for input(s): LIPASE, AMYLASE in the last 72 hours. BNP: BNP (last 3 results) Recent Labs    01/11/20 1745  BNP 136.0*    ProBNP (last 3 results) No results for input(s): PROBNP in the last 8760 hours.  D-Dimer: No results for input(s): DDIMER in the last 72 hours. Hemoglobin A1C: No results for input(s): HGBA1C in the last 72 hours. Fasting Lipid Panel: No results for input(s): CHOL, HDL, LDLCALC, TRIG, CHOLHDL, LDLDIRECT in the last 72 hours. Thyroid Function Tests: No results for input(s): TSH, T4TOTAL, T3FREE, THYROIDAB in the last 72 hours.  Invalid input(s): FREET3 Anemia Panel: No results for input(s): VITAMINB12, FOLATE, FERRITIN, TIBC, IRON, RETICCTPCT in the last 72 hours.      ASSESSMENT AND PLAN: Atrial fibrillation/flutter  Congestive heart failure-acute/chronic-diastolic  AVM with prior intracranial surgery as well as subsequent hemorrhage  Coronary artery disease with prior stenting  Hypertension  Sleep disordered breathing  Lengthy disussions regarding plans and challenges .  In the short-term, restoration of sinus rhythm is indicated for symptoms.  We will plan TEE  cardioversion hopefully for tomorrow.  N.p.o. orders are written other orders are not.  He will need at least 4 weeks of anticoagulation following cardioversion.  In this interval, we will hold his aspirin.  Thereafter things become much more difficult.  First issue is the overall risk of stroke with his CHADS-VASc score of 2 related to hypertension and vascular disease.  A study for Toribio Harbour suggested that not all CHADS-VASc points are the same and these 2 are relatively modest in value.  Hence, given the  risks and concerns of intracranial bleeding, it is not clear to me that chronic anticoagulation is the right strategy.  Intermittent anticoagulation is a strategy that is being studied.  It would be reasonable to apply this to this gentleman.  He could use either an wearable or implantable technology to identify the presence of atrial fibrillation, begin anticoagulation at that time undergo cardioversion as soon as it is possible and then discontinue his anticoagulation 4 weeks later.  Another strategy would be to consider Watchman Left Atrial Appendage Occluder as single antiplatelet therapy has been used from its implant and he needs aspirin anyway for his coronary disease and stents.  Still another strategy would be to undergo catheter ablation of his atrial fibrillation substrate.  In many centers this is been associated, following verification of success, with the discontinuation of anticoagulation.  The patient's wife has been cared for by Dr. Remus Blake at [facility].  I have suggested that reviewing options with him may be helpful for them as a have to navigate between the rock and a hard place with the consequences being related to brain injury either by clot or by bleeding.  For now, we will continue the plan to anticoagulate for 4 weeks and TEE cardioversion.  Other decisions will be deferred   1323-1430 Signed, Virl Axe MD  01/13/2020

## 2020-01-13 NOTE — Care Management (Signed)
0990 01-13-20 Benefits check submitted for Eliquis. If patient stays overnight, he will benefit from Rx being sent to [place] to be delivered to the bedside on Monday.  Bethena Roys, RN,BSN Case Manager

## 2020-01-13 NOTE — Care Management Obs Status (Signed)
[place] NOTIFICATION   Patient Details  Name: Peter Barnett MRN: [id number] Date of Birth: Feb 15, 1960   Medicare Observation Status Notification Given:  Yes    Bethena Roys, RN 01/13/2020, 9:52 AM

## 2020-01-13 NOTE — Progress Notes (Signed)
PROGRESS NOTE    Peter Barnett  WEX:[id number] DOB: 26-Nov-1960 DOA: 01/11/2020 PCP: [name], MD    Brief Narrative:  This 59 years old male with medical history significant for atrial fibrillation, COPD, CAD s/p stents, hypertension, hyperlipidemia, GERD, history of AV malformation and prior intracranial hemorrhage, seizures who presents with progressive shortness of breath associated with palpitation for 1 day.  Patient recently went to a trip to [place] and arrived home on the day of admission.  He was found to have elevated troponins,  CTA shows small subsegmental PE.  Patient was started on heparin and Cardizem drip for A. fib and PE.  Cardiology was consulted, recommended to continue anticoagulation, transition heparin to Eliquis. Continue  heart rate control with anticipation for TEE guided cardioversion.   Assessment & Plan:   Principal Problem:   Atrial fibrillation with RVR (HCC) Active Problems:   Seizure ([place])   HTN (hypertension), benign   Hyperlipidemia   Coronary artery disease involving native coronary artery of native heart without angina pectoris   Breath shortness   COPD (chronic obstructive pulmonary disease) (HCC)   GERD (gastroesophageal reflux disease)   Essential hypertension   Personal history of arterial venous malformation (AVM)   S/P coronary artery stent placement   H/O intracranial hemorrhage   Atrial flutter with RVR: He has about 1 year history of A. fib,  He was discharged with 1 month of Eliquis after new onset A. fib and on follow-up switched to aspirin alone as he has a history of intracranial bleed secondary to AVMs in 2016. Patient just returned from a trip to [place] He has developed 3 days of shortness of breath and 1 day palpitations PE likely contributing trigger A. Fib with RVR Troponin trending down, flat could be due to Afib with RVR. Cardiology consulted ,  We will likely need input from cardiology and/or neuro on  anticoagulation plans Continue diltiazem drip for heart rate control  heparin drip transition to Eliquis. Continue home metoprolol Cardiology is planning TEE guided cardioversion. Follow-up DVT study  Small subsegmental PE: Patient was on heparin,  transitioned to Eliquis. Discussed with neurology states if patient had AV malformation which was clipped then patient can continue anticoagulation.  Hypertension :  Continue metoprolol.  Hyperlipidemia:  Continue Lipitor  CAD status post stent x3 Continue home metoprolol, Lasix, rosuvastatin, aspirin  COPD - Continue home albuterol -Not in any acute exacerbation.  GERD - Continue home Pepcid  Seizure - Continue home Trileptal  Chronic back pain - Continue home Norco.  History of intracranial bleed secondary to AVMs in 2016 > No evidence of residual or recurrent AVMs on angiogram on 12/31/2014.   DVT prophylaxis: Eliquis Code Status: Full code Family Communication: No one at bedside Disposition Plan:  Status is: Inpatient  Remains inpatient appropriate because:Inpatient level of care appropriate due to severity of illness   Dispo: The patient is from: Home              Anticipated d/c is to: Home              Anticipated d/c date is: 1 day              Patient currently is not medically stable to d/c.   Consultants:   Cardiology  Procedures: None Antimicrobials:   Anti-infectives (From admission, onward)   None      Subjective: Patient was seen and examined at bedside.  Overnight events noted.  Patient reports feeling better.  Patient denies any chest pain, shortness of breath.  Heart rate is under good control.  Objective: Vitals:   01/13/20 0627 01/13/20 0725 01/13/20 0856 01/13/20 1219  BP: (!) 125/92 113/83 133/79   Pulse:  68 66 (!) 54  Resp:  18 16 16   Temp:  97.9 F (36.6 C) 98.4 F (36.9 C)   TempSrc:  Oral Oral   SpO2:      Weight:      Height:        Intake/Output Summary  (Last 24 hours) at 01/13/2020 1352 Last data filed at 01/13/2020 1100 Gross per 24 hour  Intake 1997.09 ml  Output 2250 ml  Net -252.91 ml   Filed Weights   01/11/20 1519 01/12/20 1448 01/13/20 0425  Weight: 113.4 kg 116.8 kg 115.2 kg    Examination:  General exam: Appears calm and comfortable , not in any distress Respiratory system: Clear to auscultation. Respiratory effort normal. Cardiovascular system: S1 & S2 heard, irregular rhythm no JVD, murmurs, rubs, gallops or clicks. No pedal edema. Gastrointestinal system: Abdomen is nondistended, soft and nontender. No organomegaly or masses felt.  Normal bowel sounds heard. Central nervous system: Alert and oriented. No focal neurological deficits. Extremities: Symmetric 5 x 5 power. Skin: No rashes, lesions or ulcers Psychiatry: Judgement and insight appear normal. Mood & affect appropriate.     Data Reviewed: I have personally reviewed following labs and imaging studies  CBC: Recent Labs  Lab 01/11/20 1521 01/12/20 0352  WBC 9.2 10.0  HGB 17.0 16.5  HCT 54.3* 50.6  MCV 94.6 90.7  PLT 162 588   Basic Metabolic Panel: Recent Labs  Lab 01/11/20 1521 01/12/20 0352 01/13/20 0336  NA 139 138 137  K 3.7 3.6 3.5  CL 107 105 103  CO2 17* 22 24  GLUCOSE 96 137* 121*  BUN 12 10 9   CREATININE 0.87 0.86 0.82  CALCIUM 9.0 9.3 9.1  MG  --   --  2.1  PHOS  --   --  3.4   GFR: Estimated Creatinine Clearance: 123.7 mL/min (by C-G formula based on SCr of 0.82 mg/dL). Liver Function Tests: Recent Labs  Lab 01/13/20 0336  AST 16  ALT 29  ALKPHOS 91  BILITOT 1.0  PROT 6.0*  ALBUMIN 3.5   No results for input(s): LIPASE, AMYLASE in the last 168 hours. No results for input(s): AMMONIA in the last 168 hours. Coagulation Profile: No results for input(s): INR, PROTIME in the last 168 hours. Cardiac Enzymes: No results for input(s): CKTOTAL, CKMB, CKMBINDEX, TROPONINI in the last 168 hours. BNP (last 3 results) No  results for input(s): PROBNP in the last 8760 hours. HbA1C: No results for input(s): HGBA1C in the last 72 hours. CBG: No results for input(s): GLUCAP in the last 168 hours. Lipid Profile: No results for input(s): CHOL, HDL, LDLCALC, TRIG, CHOLHDL, LDLDIRECT in the last 72 hours. Thyroid Function Tests: No results for input(s): TSH, T4TOTAL, FREET4, T3FREE, THYROIDAB in the last 72 hours. Anemia Panel: No results for input(s): VITAMINB12, FOLATE, FERRITIN, TIBC, IRON, RETICCTPCT in the last 72 hours. Sepsis Labs: No results for input(s): PROCALCITON, LATICACIDVEN in the last 168 hours.  Recent Results (from the past 240 hour(s))  Resp Panel by RT-PCR (Flu A&B, Covid) Nasopharyngeal Swab     Status: None   Collection Time: 01/11/20 10:12 PM   Specimen: Nasopharyngeal Swab; Nasopharyngeal(NP) swabs in vial transport medium  Result Value Ref Range Status   SARS Coronavirus 2 by RT PCR  NEGATIVE NEGATIVE Final    Comment: (NOTE) SARS-CoV-2 target nucleic acids are NOT DETECTED.  The SARS-CoV-2 RNA is generally detectable in upper respiratory specimens during the acute phase of infection. The lowest concentration of SARS-CoV-2 viral copies this assay can detect is 138 copies/mL. A negative result does not preclude SARS-Cov-2 infection and should not be used as the sole basis for treatment or other patient management decisions. A negative result may occur with  improper specimen collection/handling, submission of specimen other than nasopharyngeal swab, presence of viral mutation(s) within the areas targeted by this assay, and inadequate number of viral copies(<138 copies/mL). A negative result must be combined with clinical observations, patient history, and epidemiological information. The expected result is Negative.  Fact Sheet for Patients:  EntrepreneurPulse.com.au  Fact Sheet for Healthcare Providers:  IncredibleEmployment.be  This test is  no t yet approved or cleared by the [place] FDA and  has been authorized for detection and/or diagnosis of SARS-CoV-2 by FDA under an Emergency Use Authorization (EUA). This EUA will remain  in effect (meaning this test can be used) for the duration of the COVID-19 declaration under Section 564(b)(1) of the Act, 21 U.S.C.section 360bbb-3(b)(1), unless the authorization is terminated  or revoked sooner.       Influenza A by PCR NEGATIVE NEGATIVE Final   Influenza B by PCR NEGATIVE NEGATIVE Final    Comment: (NOTE) The Xpert Xpress SARS-CoV-2/FLU/RSV plus assay is intended as an aid in the diagnosis of influenza from Nasopharyngeal swab specimens and should not be used as a sole basis for treatment. Nasal washings and aspirates are unacceptable for Xpert Xpress SARS-CoV-2/FLU/RSV testing.  Fact Sheet for Patients: EntrepreneurPulse.com.au  Fact Sheet for Healthcare Providers: IncredibleEmployment.be  This test is not yet approved or cleared by the [place] FDA and has been authorized for detection and/or diagnosis of SARS-CoV-2 by FDA under an Emergency Use Authorization (EUA). This EUA will remain in effect (meaning this test can be used) for the duration of the COVID-19 declaration under Section 564(b)(1) of the Act, 21 U.S.C. section 360bbb-3(b)(1), unless the authorization is terminated or revoked.  Performed at [facility], [place] [address] [place], [place] [address]      Radiology Studies: DG Chest 2 View  Result Date: 01/11/2020 CLINICAL DATA:  Chest pain. EXAM: CHEST - 2 VIEW COMPARISON:  Jun 20, 2018 FINDINGS: The heart, hila, and mediastinum are normal. No pneumothorax. No nodules or masses. No focal infiltrates. IMPRESSION: No active cardiopulmonary disease. Electronically Signed   By: Dorise Bullion III M.D   On: 01/11/2020 16:02   CT Angio Chest PE W/Cm &/Or Wo Cm  Result Date: 01/11/2020 CLINICAL DATA:   Shortness of breath.  Fall. EXAM: CT ANGIOGRAPHY CHEST WITH CONTRAST TECHNIQUE: Multidetector CT imaging of the chest was performed using the standard protocol during bolus administration of intravenous contrast. Multiplanar CT image reconstructions and MIPs were obtained to evaluate the vascular anatomy. CONTRAST:  40mL OMNIPAQUE IOHEXOL 350 MG/ML SOLN COMPARISON:  Chest CT 12/20/2014. FINDINGS: Cardiovascular: The heart size is normal. No substantial pericardial effusion. Coronary artery calcification is evident. No thoracic aortic aneurysm. There is no large central pulmonary embolus. No lobar or segmental embolus. A tiny nonocclusive subsegmental pulmonary embolus is identified in the left upper lobe (axial image 179 of series 7 and best demonstrated on sagittal images 128-131 of series 9). No other findings of acute pulmonary embolus. Mediastinum/Nodes: No mediastinal lymphadenopathy. There is no hilar lymphadenopathy. The esophagus has normal imaging features.  There is no axillary lymphadenopathy. Lungs/Pleura: Centrilobular and paraseptal emphysema evident. No evidence for pneumothorax or pleural effusion. 5 mm right pulmonary nodule on 69/6 is stable since prior study consistent with benign etiology. Tiny perifissural nodule in the right lower lobe on 70/6 is also stable. No focal airspace consolidation. Upper Abdomen: 15 mm right adrenal nodule has average attenuation of -3 Hounsfield units, consistent with adenoma. Musculoskeletal: No worrisome lytic or sclerotic osseous abnormality. Review of the MIP images confirms the above findings. IMPRESSION: 1. Tiny nonocclusive subsegmental pulmonary embolus in the left upper lobe. No other acute pulmonary embolus on the current exam. 2. Tiny right lung nodule stable since 2016 consistent with benign etiology. 3. 15 mm right adrenal adenoma. 4. Emphysema (ICD10-J43.9). Electronically Signed   By: Misty Stanley M.D.   On: 01/11/2020 20:11   Scheduled Meds: .  apixaban  10 mg Oral BID   Followed by  . [START ON 01/19/2020] apixaban  5 mg Oral BID  . famotidine  20 mg Oral QAC breakfast  . furosemide  20 mg Oral BID  . metoprolol tartrate  25 mg Oral BID  . Oxcarbazepine  600 mg Oral BID  . rosuvastatin  40 mg Oral QODAY  . sodium chloride flush  3 mL Intravenous Q12H   Continuous Infusions: . diltiazem (CARDIZEM) infusion Stopped (01/13/20 1223)     LOS: 0 days    Time spent: 25 mins    Karolee Meloni, MD [facility]   If 7PM-7AM, please contact night-coverage

## 2020-01-13 NOTE — Progress Notes (Signed)
  Echocardiogram 2D Echocardiogram has been performed.  Peter Barnett 01/13/2020, 10:48 AM

## 2020-01-13 NOTE — Progress Notes (Signed)
    [facility] has been requested to perform a transesophageal echocardiogram on 12/06 for atrial fib.  After careful review of history and examination, the risks and benefits of transesophageal echocardiogram have been explained including risks of esophageal damage, perforation (1:10,000 risk), bleeding, pharyngeal hematoma as well as other potential complications associated with conscious sedation including aspiration, arrhythmia, respiratory failure and death. Alternatives to treatment were discussed, questions were answered. Patient is willing to proceed.   Orders written.  Rosaria Ferries, PA-C 01/13/2020 5:44 PM

## 2020-01-14 ENCOUNTER — Inpatient Hospital Stay (HOSPITAL_COMMUNITY): Payer: Medicare HMO | Admitting: Anesthesiology

## 2020-01-14 ENCOUNTER — Encounter (HOSPITAL_COMMUNITY)
Admission: EM | Disposition: A | Discharge status: Home / Self Care | Payer: Self-pay | Source: Home / Self Care | Attending: Family Medicine

## 2020-01-14 ENCOUNTER — Encounter (HOSPITAL_COMMUNITY): Payer: Self-pay | Admitting: Family Medicine

## 2020-01-14 ENCOUNTER — Other Ambulatory Visit (HOSPITAL_COMMUNITY): Payer: Self-pay | Admitting: Family Medicine

## 2020-01-14 ENCOUNTER — Other Ambulatory Visit (HOSPITAL_COMMUNITY): Payer: Medicare HMO

## 2020-01-14 ENCOUNTER — Inpatient Hospital Stay (HOSPITAL_COMMUNITY): Payer: Medicare HMO

## 2020-01-14 DIAGNOSIS — I4891 Unspecified atrial fibrillation: Secondary | ICD-10-CM

## 2020-01-14 DIAGNOSIS — Z955 Presence of coronary angioplasty implant and graft: Secondary | ICD-10-CM | POA: Diagnosis not present

## 2020-01-14 DIAGNOSIS — Z8774 Personal history of (corrected) congenital malformations of heart and circulatory system: Secondary | ICD-10-CM | POA: Diagnosis not present

## 2020-01-14 DIAGNOSIS — Q211 Atrial septal defect: Secondary | ICD-10-CM

## 2020-01-14 HISTORY — PX: TEE WITHOUT CARDIOVERSION: SHX5443

## 2020-01-14 HISTORY — PX: CARDIOVERSION: SHX1299

## 2020-01-14 LAB — MAGNESIUM: Magnesium: 1.9 mg/dL (ref 1.7–2.4)

## 2020-01-14 LAB — BASIC METABOLIC PANEL
Anion gap: 9 (ref 5–15)
BUN: 9 mg/dL (ref 6–20)
CO2: 21 mmol/L — ABNORMAL LOW (ref 22–32)
Calcium: 8.9 mg/dL (ref 8.9–10.3)
Chloride: 105 mmol/L (ref 98–111)
Creatinine, Ser: 0.81 mg/dL (ref 0.61–1.24)
GFR, Estimated: >60 mL/min (ref >60)
Glucose, Bld: 106 mg/dL — ABNORMAL HIGH (ref 70–99)
Potassium: 3.8 mmol/L (ref 3.5–5.1)
Sodium: 135 mmol/L (ref 135–145)

## 2020-01-14 SURGERY — ECHOCARDIOGRAM, TRANSESOPHAGEAL
Anesthesia: Monitor Anesthesia Care

## 2020-01-14 MED ORDER — APIXABAN 5 MG PO TABS: 5.0000 mg | ORAL_TABLET | Freq: Two times a day (BID) | ORAL | Status: DC

## 2020-01-14 MED ORDER — LIDOCAINE HCL (CARDIAC) PF 100 MG/5ML IV SOSY
PREFILLED_SYRINGE | INTRAVENOUS | Status: DC | PRN
  Administered 2020-01-14: 60 mg via INTRAVENOUS

## 2020-01-14 MED ORDER — APIXABAN 5 MG PO TABS
5.0000 mg | ORAL_TABLET | Freq: Two times a day (BID) | ORAL | 0 refills | Status: DC
Start: 2020-01-14 — End: 2020-02-11

## 2020-01-14 MED ORDER — PROPOFOL 500 MG/50ML IV EMUL
INTRAVENOUS | Status: DC | PRN
  Administered 2020-01-14: 150 ug/kg/min via INTRAVENOUS

## 2020-01-14 MED FILL — ELIQUIS 5 MG TABLET: 5 | 30 days supply | Qty: 60 | Fill #0

## 2020-01-14 NOTE — Anesthesia Procedure Notes (Signed)
Procedure Name: MAC Date/Time: 01/14/2020 11:33 AM Performed by: Lavell Luster, CRNA Pre-anesthesia Checklist: Patient identified, Emergency Drugs available, Patient being monitored and Suction available Patient Re-evaluated:Patient Re-evaluated prior to induction Oxygen Delivery Method: Nasal cannula Preoxygenation: Pre-oxygenation with 100% oxygen Induction Type: IV induction Placement Confirmation: positive ETCO2 Dental Injury: Teeth and Oropharynx as per pre-operative assessment

## 2020-01-14 NOTE — Anesthesia Postprocedure Evaluation (Signed)
Anesthesia Post Note  Patient: Peter Barnett  Procedure(s) Performed: TRANSESOPHAGEAL ECHOCARDIOGRAM (TEE) (N/A ) CARDIOVERSION (N/A )     Patient location during evaluation: PACU Anesthesia Type: MAC Level of consciousness: awake and alert Pain management: pain level controlled Vital Signs Assessment: post-procedure vital signs reviewed and stable Respiratory status: spontaneous breathing and respiratory function stable Cardiovascular status: stable Postop Assessment: no apparent nausea or vomiting Anesthetic complications: no   No complications documented.  Last Vitals:  Vitals:   01/14/20 1230 01/14/20 1249  BP: 117/79 136/76  Pulse: 70 60  Resp: 14 19  Temp:  36.9 C  SpO2: 94% 95%    Last Pain:  Vitals:   01/14/20 1249  TempSrc: Oral  PainSc: 8                  Katlin Bortner DANIEL

## 2020-01-14 NOTE — Interval H&P Note (Signed)
History and Physical Interval Note:  01/14/2020 11:17 AM  Peter Barnett  has presented today for surgery, with the diagnosis of afib.  The various methods of treatment have been discussed with the patient and family. After consideration of risks, benefits and other options for treatment, the patient has consented to  Procedure(s): TRANSESOPHAGEAL ECHOCARDIOGRAM (TEE) (N/A) CARDIOVERSION (N/A) as a surgical intervention.  The patient's history has been reviewed, patient examined, no change in status, stable for surgery.  I have reviewed the patient's chart and labs.  Questions were answered to the patient's satisfaction.     Peter Barnett

## 2020-01-14 NOTE — Progress Notes (Signed)
  Echocardiogram Echocardiogram Transesophageal has been performed.  Peter Barnett 01/14/2020, 12:53 PM

## 2020-01-14 NOTE — TOC Benefit Eligibility Note (Signed)
Transition of Care [facility]) Benefit Eligibility Note    Patient Details  Name: Peter Barnett MRN: [id number] Date of Birth: 08-03-1960   Medication/Dose: Eliquis   Prescription Coverage Preferred Pharmacy: [facility], [address]  Spoke with Person/Company/Phone Number:: Holland Falling [phone]  Co-Pay: $47.00 for 30 day retail/ $141 for 90 day retail or mail order        Additional Notes: [place] Phone Number: 01/14/2020, 9:16 AM

## 2020-01-14 NOTE — Plan of Care (Signed)
  Problem: Education: Goal: Knowledge of General Education information will improve Description: Including pain rating scale, medication(s)/side effects and non-pharmacologic comfort measures Outcome: Progressing   Problem: Health Behavior/Discharge Planning: Goal: Ability to manage health-related needs will improve Outcome: Progressing   Problem: Clinical Measurements: Goal: Ability to maintain clinical measurements within normal limits will improve Outcome: Progressing   Problem: Education: Goal: Knowledge of disease or condition will improve Outcome: Progressing   Problem: Activity: Goal: Ability to tolerate increased activity will improve Outcome: Progressing

## 2020-01-14 NOTE — CV Procedure (Signed)
   TRANSESOPHAGEAL ECHOCARDIOGRAM GUIDED DIRECT CURRENT CARDIOVERSION  NAME:  Peter Barnett   MRN: [id number] DOB:  May 24, 1960   ADMIT DATE: 01/11/2020  INDICATIONS: Symptomatic atrial fibrillation  PROCEDURE:   Informed consent was obtained prior to the procedure. The risks, benefits and alternatives for the procedure were discussed and the patient comprehended these risks.  Risks include, but are not limited to, cough, sore throat, vomiting, nausea, somnolence, esophageal and stomach trauma or perforation, bleeding, low blood pressure, aspiration, pneumonia, infection, trauma to the teeth and death.    After a procedural time-out, the oropharynx was anesthetized and the patient was sedated by the anesthesia service. The transesophageal probe was inserted in the esophagus and stomach without difficulty and multiple views were obtained. Anesthesia was monitored by Dr. Tobias Alexander. Patient received 60 mg IV lidocaine and 352 mg IV propofol.  COMPLICATIONS:    Complications: No complications Patient tolerated procedure well.  FINDINGS:  LEFT VENTRICLE: EF = 55-60%. No regional wall motion abnormalities.  RIGHT VENTRICLE: Normal size and function.   LEFT ATRIUM: No thrombus/mass.  LEFT ATRIAL APPENDAGE: No thrombus/mass.   RIGHT ATRIUM: No thrombus/mass.  AORTIC VALVE:  Trileaflet. Trivial regurgitation. No vegetation.  MITRAL VALVE:    Normal structure.  No significant regurgitation. No vegetation.  TRICUSPID VALVE: Normal structure. Trivial regurgitation. No vegetation.  PULMONIC VALVE: Grossly normal structure. No regurgitation. No apparent vegetation.  INTERATRIAL SEPTUM: There is trivial left to right flow seen, suggestive of a very small PFO  PERICARDIUM: No effusion noted.  DESCENDING AORTA: Moderate diffuse plaque seen   CARDIOVERSION:     Indications:  Symptomatic Atrial Fibrillation  Procedure Details:  Once the TEE was complete, the patient had the defibrillator  pads placed in the anterior and posterior position. Once an appropriate level of sedation was confirmed, the patient was cardioverted x 1 with 150J of biphasic synchronized energy.  The patient converted to NSR.  There were no apparent complications.  The patient had normal neuro status and respiratory status post procedure with vitals stable as recorded elsewhere.  Adequate airway was maintained throughout and vital signs monitored per protocol.  Buford Dresser, MD, PhD [facility]  [address], [place], [place] [address] [phone]   12:01 PM

## 2020-01-14 NOTE — Progress Notes (Addendum)
Electrophysiology Rounding Note  Patient Name: DUAN SCHARNHORST Date of Encounter: 01/14/2020  Primary Cardiologist: Ellis Parents, MD Electrophysiologist: New to Dr. Caryl Comes. Pts wife sees Dr. Remus Blake at [facility]    Subjective   The patient is doing well today.  At this time, the patient denies chest pain, shortness of breath, or any new concerns.  Inpatient Medications    Scheduled Meds: . apixaban  10 mg Oral BID   Followed by  . [START ON 01/19/2020] apixaban  5 mg Oral BID  . famotidine  20 mg Oral QAC breakfast  . furosemide  20 mg Oral BID  . metoprolol tartrate  25 mg Oral BID  . Oxcarbazepine  600 mg Oral BID  . rosuvastatin  40 mg Oral QODAY  . sodium chloride flush  3 mL Intravenous Q12H   Continuous Infusions: . sodium chloride 20 mL/hr at 01/14/20 0600  . diltiazem (CARDIZEM) infusion Stopped (01/13/20 1220)   PRN Meds: acetaminophen **OR** acetaminophen, albuterol, HYDROcodone-acetaminophen, polyethylene glycol   Vital Signs    Vitals:   01/13/20 2052 01/13/20 2056 01/13/20 2300 01/14/20 0454  BP: 132/84  128/85 124/89  Pulse: 83 86 100 96  Resp: 16  16 18   Temp: 98.2 F (36.8 C)  98.2 F (36.8 C) 98.1 F (36.7 C)  TempSrc: Oral  Oral Oral  SpO2: 93%  96% 96%  Weight:    115.4 kg  Height:    5\' 11"  (1.803 m)    Intake/Output Summary (Last 24 hours) at 01/14/2020 0759 Last data filed at 01/14/2020 0600 Gross per 24 hour  Intake 1386.6 ml  Output 2500 ml  Net -1113.4 ml   Filed Weights   01/12/20 1448 01/13/20 0425 01/14/20 0454  Weight: 116.8 kg 115.2 kg 115.4 kg    Physical Exam    GEN- The patient is well appearing, alert and oriented x 3 today.   Head- normocephalic, atraumatic Eyes-  Sclera clear, conjunctiva pink Ears- hearing intact Oropharynx- clear Neck- supple Lungs- Clear to ausculation bilaterally, normal work of breathing Heart- Regular rate and rhythm, no murmurs, rubs or gallops GI- soft, NT, ND, + BS Extremities- no  clubbing or cyanosis. No edema Skin- no rash or lesion Psych- euthymic mood, full affect Neuro- strength and sensation are intact  Labs    CBC Recent Labs    01/11/20 1521 01/12/20 0352  WBC 9.2 10.0  HGB 17.0 16.5  HCT 54.3* 50.6  MCV 94.6 90.7  PLT 162 248   Basic Metabolic Panel Recent Labs    01/12/20 0352 01/13/20 0336  NA 138 137  K 3.6 3.5  CL 105 103  CO2 22 24  GLUCOSE 137* 121*  BUN 10 9  CREATININE 0.86 0.82  CALCIUM 9.3 9.1  MG  --  2.1  PHOS  --  3.4   Liver Function Tests Recent Labs    01/13/20 0336  AST 16  ALT 29  ALKPHOS 91  BILITOT 1.0  PROT 6.0*  ALBUMIN 3.5   No results for input(s): LIPASE, AMYLASE in the last 72 hours. Cardiac Enzymes No results for input(s): CKTOTAL, CKMB, CKMBINDEX, TROPONINI in the last 72 hours.   Telemetry    AF 80-110s (personally reviewed)  Radiology    ECHOCARDIOGRAM COMPLETE  Result Date: 01/13/2020    ECHOCARDIOGRAM REPORT   Patient Name:   BENJIMAN SEDGWICK Sonneborn Date of Exam: 01/13/2020 Medical Rec #:  [id number]       Height:  71.0 in Accession #:    [id number]      Weight:       253.9 lb Date of Birth:  September 26, 1960        BSA:          2.334 m Patient Age:    59 years        BP:           133/79 mmHg Patient Gender: M               HR:           67 bpm. Exam Location:  Inpatient Procedure: 2D Echo Indications:    atrial fibrillation 427.31  History:        Patient has prior history of Echocardiogram examinations, most                 recent 06/21/2018. CAD, COPD, Arrythmias:Atrial Fibrillation;                 Risk Factors:Hypertension and Dyslipidemia.  Sonographer:    Johny Chess Referring Phys: [place]  1. Left ventricular ejection fraction, by estimation, is 55% with beat to beat variability. The left ventricle has low normal function. Left ventricular endocardial border not optimally defined to evaluate regional wall motion. There is mild left ventricular hypertrophy. Left  ventricular diastolic parameters are indeterminate, however medial e' is 9 cm/s, and diastolic function is likely grossly preserved.  2. Right ventricular systolic function is mildly reduced. The right ventricular size is normal. Tricuspid regurgitation signal is inadequate for assessing PA pressure.  3. The mitral valve is normal in structure. No evidence of mitral valve regurgitation. No evidence of mitral stenosis.  4. The aortic valve is tricuspid. There is mild calcification of the aortic valve. Aortic valve regurgitation is trivial. No aortic stenosis is present.  5. The inferior vena cava is dilated in size with >50% respiratory variability, suggesting right atrial pressure of 8 mmHg. FINDINGS  Left Ventricle: Left ventricular ejection fraction, by estimation, is 55%. The left ventricle has low normal function. Left ventricular endocardial border not optimally defined to evaluate regional wall motion. The left ventricular internal cavity size was normal in size. There is mild left ventricular hypertrophy. Left ventricular diastolic parameters are indeterminate. Right Ventricle: The right ventricular size is normal. No increase in right ventricular wall thickness. Right ventricular systolic function is mildly reduced. Tricuspid regurgitation signal is inadequate for assessing PA pressure. Left Atrium: Left atrial size was normal in size. Right Atrium: Right atrial size was normal in size. Pericardium: There is no evidence of pericardial effusion. Mitral Valve: The mitral valve is normal in structure. No evidence of mitral valve regurgitation. No evidence of mitral valve stenosis. Tricuspid Valve: The tricuspid valve is normal in structure. Tricuspid valve regurgitation is trivial. No evidence of tricuspid stenosis. Aortic Valve: The aortic valve is tricuspid. There is mild calcification of the aortic valve. Aortic valve regurgitation is trivial. No aortic stenosis is present. Pulmonic Valve: The pulmonic valve  was grossly normal. Pulmonic valve regurgitation is trivial. No evidence of pulmonic stenosis. Aorta: The aortic root is normal in size and structure. Venous: The inferior vena cava is dilated in size with greater than 50% respiratory variability, suggesting right atrial pressure of 8 mmHg. IAS/Shunts: No atrial level shunt detected by color flow Doppler.  LEFT VENTRICLE PLAX 2D LVIDd:         4.70 cm LVIDs:  3.70 cm LV PW:         1.30 cm LV IVS:        1.10 cm LVOT diam:     2.00 cm LVOT Area:     3.14 cm  RIGHT VENTRICLE         IVC TAPSE (M-mode): 1.9 cm  IVC diam: 2.10 cm LEFT ATRIUM             Index       RIGHT ATRIUM           Index LA diam:        3.70 cm 1.59 cm/m  RA Area:     17.80 cm LA Vol (A2C):   65.1 ml 27.90 ml/m RA Volume:   47.50 ml  20.35 ml/m LA Vol (A4C):   71.0 ml 30.42 ml/m LA Biplane Vol: 70.6 ml 30.25 ml/m   AORTA Ao Root diam: 3.60 cm Ao Asc diam:  3.60 cm  SHUNTS Systemic Diam: 2.00 cm Cherlynn Kaiser MD Electronically signed by Cherlynn Kaiser MD Signature Date/Time: 01/13/2020/5:08:18 PM    Final     Patient Profile     Tommie Raymond D Cornsis a 59 y.o.malewith a hx of CAD w/ DES CFX/LAD/RCA 2009, MI w/ DES D1 2017 w/ dRCA 100%, HLN, HLD, COPD, GERD, CVA w/ small ICH 2016, intracranial AVMs, Sz,with a history of atrial fibrillation7/20 at which point he was anticoagulated and underwent TEE guided cardioversion with subsequent discontinuation of his anticoagulation and treatment with aspirin admitted 12/4 with recurrent atrial fibrillation associated with dyspnea on exertion.  Assessment & Plan    Persistent atrial fibrillation/flutter Plan TEE/DCC this am.  Will need at least 4 weeks of [place] following [place] CHA2DS2VASC of at least 2.  (HTN / Vascular disease) ? Consideration of intermittent OAC vs Watchman LAAO per Dr. Aquilla Hacker note.   Hypokalemia K 3.5 yesterday. Stat labs this am and supp prn prior to [facility]  Congestive heart  failure-acute/chronic-diastolic  AVM with prior intracranial surgery as well as subsequent hemorrhage  Coronary artery disease with prior stenting  Hypertension  Sleep disordered breathing  Plan for TEE/DCC this am with potential d/c home after. Will discuss f/u with Dr. Caryl Comes.   For questions or updates, please contact [place] Please consult [email or web address] for contact info under Cardiology/STEMI.  Signed, Shirley Friar, PA-C  01/14/2020, 7:59 AM   Atrial fibrillation/flutter  Congestive heart failure-acute/chronic-diastolic  AVM with prior intracranial surgery as well as subsequent hemorrhage  Coronary artery disease with prior stenting  Hypertension  Sleep disordered breathing  Lengthy discussion as team this am.  More support for Watchman Left Atrial Appendage Occluder as a primary therapy but also for idea of using wearable or implantable technology for intermittent anticoagulation with documented afib  For today  1)  Proceeding wth TEE DCCV 2) anticoagulation w Apixoban   x 4 weeks and then resume ASA and stop anticoagulation -- scheudled to see Dr Marisue Ivan about that time.  Have reached out to pharmacy for the data underlying their recommendation regarding Apixoban  10 mg bid x 7 d THIS NEEDS TO BE CLARIFIED Prior to todays anticipated discharge  3) will need to decide on EP team-- his wife is followed at [facility] so that may be ideal.  We are glad to help however  Needs OP sleep study   Spoke w Bonnita Nasuti from pharmacy  The proper dose Apixoban is 5 mg bid  Orders written

## 2020-01-14 NOTE — Anesthesia Preprocedure Evaluation (Signed)
Anesthesia Evaluation  Patient identified by MRN, date of birth, ID band Patient awake    Reviewed: Allergy & Precautions, NPO status , Patient's Chart, lab work & pertinent test results  Airway Mallampati: III  TM Distance: >3 FB Neck ROM: Full    Dental no notable dental hx. (+) Dental Advisory Given   Pulmonary COPD, former smoker,    Pulmonary exam normal        Cardiovascular hypertension, Pt. on medications and Pt. on home beta blockers + CAD and + Past MI  + dysrhythmias Atrial Fibrillation  Rhythm:Irregular Rate:Normal  IMPRESSIONS    1. Left ventricular ejection fraction, by estimation, is 55% with beat to  beat variability. The left ventricle has low normal function. Left  ventricular endocardial border not optimally defined to evaluate regional  wall motion. There is mild left  ventricular hypertrophy. Left ventricular diastolic parameters are  indeterminate, however medial e' is 9 cm/s, and diastolic function is  likely grossly preserved.  2. Right ventricular systolic function is mildly reduced. The right  ventricular size is normal. Tricuspid regurgitation signal is inadequate  for assessing PA pressure.  3. The mitral valve is normal in structure. No evidence of mitral valve  regurgitation. No evidence of mitral stenosis.  4. The aortic valve is tricuspid. There is mild calcification of the  aortic valve. Aortic valve regurgitation is trivial. No aortic stenosis is  present.  5. The inferior vena cava is dilated in size with >50% respiratory  variability, suggesting right atrial pressure of 8 mmHg.    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 CVA negative psych ROS   GI/Hepatic Neg liver ROS, GERD  Medicated,  Endo/Other  negative endocrine ROS  Renal/GU negative Renal ROS  negative genitourinary   Musculoskeletal negative musculoskeletal ROS (+)   Abdominal    Peds negative pediatric ROS (+)  Hematology negative hematology ROS (+)   Anesthesia Other Findings   Reproductive/Obstetrics negative OB ROS                             Anesthesia Physical Anesthesia Plan  ASA: III  Anesthesia Plan: MAC   Post-op Pain Management:    Induction: Intravenous  PONV Risk Score and Plan: TIVA  Airway Management Planned: Natural Airway and Simple Face Mask  Additional Equipment:   Intra-op Plan:   Post-operative Plan:   Informed Consent:   Plan Discussed with: Anesthesiologist and Surgeon  Anesthesia Plan Comments:         Anesthesia Quick Evaluation

## 2020-01-14 NOTE — Transfer of Care (Signed)
Immediate Anesthesia Transfer of Care Note  Patient: Merlin Golden Placencia  Procedure(s) Performed: TRANSESOPHAGEAL ECHOCARDIOGRAM (TEE) (N/A ) CARDIOVERSION (N/A )  Patient Location: Endoscopy Unit  Anesthesia Type:MAC  Level of Consciousness: awake, alert  and sedated  Airway & Oxygen Therapy: Patient connected to face mask oxygen  Post-op Assessment: Post -op Vital signs reviewed and stable  Post vital signs: stable  Last Vitals:  Vitals Value Taken Time  BP    Temp    Pulse    Resp    SpO2      Last Pain:  Vitals:   01/14/20 1101  TempSrc: Axillary  PainSc: 5       Patients Stated Pain Goal: 4 (90/12/22 4114)  Complications: No complications documented.

## 2020-01-14 NOTE — Progress Notes (Signed)
  Have discussed with Pharmacy who spoke to Dr. Caryl Comes this am.   With small, subsegmental PE, will plan on proceeding with Eliquis 5 mg BID x 4 weeks. (no up front loading with 10 mg)  Legrand Como [address] [place], [place]  01/14/2020 2:27 PM

## 2020-01-14 NOTE — Discharge Summary (Signed)
Physician Discharge Summary  Peter Barnett EQA:[id number] DOB: 07/30/1960 DOA: 01/11/2020  PCP: [name], MD  Admit date: 01/11/2020   Discharge date: 01/14/2020  Admitted From:  Home.  Disposition:  Home.  Recommendations for Outpatient Follow-up:  1. Follow up with PCP in 1-2 weeks. 2. Please obtain BMP/CBC in one week. 3. Advised to take Eliquis 5 mg twice daily for 30 days followed by aspirin only. 4. Patient underwent cardioversion,  A. fib has been converted to normal sinus rhythm.   5. Follow-up cardiology as scheduled.  Home Health: None. Equipment/Devices: None  Discharge Condition: Stable CODE STATUS:Full code Diet recommendation: Heart Healthy   Brief Summary / Hospital course: This 59 years old male with medical history significant for atrial fibrillation, COPD, CAD s/p stents, hypertension, hyperlipidemia, GERD, history of AV malformation and prior intracranial hemorrhage, seizure disorder who presented with progressive shortness of breath associated with palpitation for one day. Patient recently went to a trip to [place] and arrived home on the day of admission.  He presented in the emergency department with shortness of breath and palpitation. He was found to have elevated troponins, EKG shows A. fib with RVR. CTA shows small subsegmental PE.  Patient was started on heparin and Cardizem drip for A. fib and PE.  Cardiology was consulted, recommended to continue anticoagulation.  Neurology was consulted to discuss anticoagulation given history of brain aneurysm status post clipping.  Patient was continued on heparin and transitioned to Eliquis.  Heart rate controlled with Cardizem gtt.  Patient underwent TEE with cardioversion.  Atrial fibrillation converted to normal sinus rhythm.  Patient tolerated well,  feels fine and wants to be discharged home.  Patient was cleared from cardiology to be discharged.  Advised to take Eliquis 5 mg twice daily for 30 days followed by  aspirin alone.  He was managed for below problems.  Discharge Diagnoses:  Principal Problem:   Atrial fibrillation with RVR ([place]) Active Problems:   Seizure ([place])   HTN (hypertension), benign   Hyperlipidemia   Coronary artery disease involving native coronary artery of native heart without angina pectoris   Breath shortness   COPD (chronic obstructive pulmonary disease) ([facility])   GERD (gastroesophageal reflux disease)   Essential hypertension   Personal history of arterial venous malformation (AVM)   S/P coronary artery stent placement   H/O intracranial hemorrhage  Atrial flutter with RVR: Resolved He has about 1 year history of A. fib,  He was discharged with 1 month of Eliquis after new onset A. fib and on follow-up switchedto aspirin alone as he has a history of intracranial bleed secondary to AVMs in 2016. Patient just returned from a trip to [place]. He has developed 3 days of shortness of breath and 1 day palpitations PE likely contributing trigger A. Fib with RVR Troponin trending down, flat could be due to Afib with RVR. Cardiology consulted ,  We will likely need input from cardiology and/or neuro on anticoagulation plans Continue diltiazem drip for heart rate control heparin drip transitioned to Eliquis. Continue home metoprolol Patient underwent TEE with successful cardioversion. Atrial fibrillation converted to normal sinus rhythm. Patient is being discharged home as per cardiology Patient is discharged on Eliquis 5 mg twice daily for 4 weeks followed by aspirin only.  Small subsegmental PE: Patient was on heparin,  transitioned to Eliquis. Discussed with neurology states if patient had AV malformation which was clipped then patient can continue anticoagulation. Continue Eliquis 5 mg twice daily for 4 weeks.  Hypertension :  Continue metoprolol.  Hyperlipidemia:  Continue Lipitor  CAD status post stentx3 Continue home metoprolol, Lasix,  rosuvastatin, aspirin  COPD -Continue home albuterol -Not in any acute exacerbation.  GERD -Continue home Pepcid  Seizure -Continue home Trileptal  Chronic back pain -Continue home Norco.  History of intracranial bleed secondary to AVMsin 2016 >No evidence of residual or recurrent AVMs on angiogram on 12/31/2014.   Discharge Instructions  Discharge Instructions    Call MD for:  difficulty breathing, headache or visual disturbances   Complete by: As directed    Call MD for:  persistant dizziness or light-headedness   Complete by: As directed    Call MD for:  persistant nausea and vomiting   Complete by: As directed    Diet - low sodium heart healthy   Complete by: As directed    Diet Carb Modified   Complete by: As directed    Discharge instructions   Complete by: As directed    Advised to follow-up with primary care physician in 1 week. Advised to take Eliquis 5 mg twice daily for 30 days followed by aspirin only. Patient underwent cardioversion A. fib has been converted to normal sinus rhythm.   Follow-up cardiology as scheduled.   Increase activity slowly   Complete by: As directed      Allergies as of 01/14/2020      Reactions   Brilinta [ticagrelor] Other (See Comments)   HISTORY OF A BRAIN BLEED- was told by an MD to not take    Tramadol Other (See Comments)   SEIZURES and "Staring off spells"   Atorvastatin Other (See Comments)   MYALGIAS   Ezetimibe Other (See Comments)   Muscle pain   Morphine And Related Nausea And Vomiting      Medication List    TAKE these medications   acetaminophen 500 MG tablet Commonly known as: TYLENOL Take 1 tablet (500 mg total) by mouth every 6 (six) hours as needed for headache. What changed: how much to take   albuterol 108 (90 Base) MCG/ACT inhaler Commonly known as: VENTOLIN HFA Inhale 2 puffs into the lungs every 6 (six) hours as needed for wheezing or shortness of breath.   apixaban 5 MG Tabs  tablet Commonly known as: ELIQUIS Take 1 tablet (5 mg total) by mouth 2 (two) times daily.   aspirin EC 81 MG tablet Take 81 mg by mouth at bedtime.   diltiazem 180 MG 24 hr capsule Commonly known as: Cardizem CD Take 1 capsule (180 mg total) by mouth daily.   docusate sodium 100 MG capsule Commonly known as: COLACE Take 100 mg by mouth 2 (two) times daily.   famotidine 20 MG tablet Commonly known as: PEPCID Take 20 mg by mouth daily before breakfast.   furosemide 20 MG tablet Commonly known as: LASIX Take 20 mg by mouth 2 (two) times daily.   HYDROcodone-acetaminophen 10-325 MG tablet Commonly known as: NORCO Take 1 tablet by mouth every 8 (eight) hours as needed (for pain).   metoprolol tartrate 25 MG tablet Commonly known as: LOPRESSOR Take 25 mg by mouth 2 (two) times daily.   nitroGLYCERIN 0.4 MG SL tablet Commonly known as: NITROSTAT Place 1 tablet (0.4 mg total) under the tongue every 5 (five) minutes as needed for chest pain (CP or SOB). What changed: reasons to take this   oxcarbazepine 600 MG tablet Commonly known as: TRILEPTAL TAKE ONE TABLET BY MOUTH TWICE DAILY   rosuvastatin 40 MG tablet Commonly  known as: CRESTOR Take 1 tablet (40 mg total) by mouth daily. What changed: when to take this   Vitamin D3 50 MCG (2000 UT) Tabs Take 2,000 Units by mouth daily.       Follow-up Information    O'Neal, Cassie Freer, MD Follow up on 02/11/2020.   Specialties: Internal Medicine, Cardiology, Radiology Why: at 1120 for post hospital follow up Contact information: [place] [address] [phone]        Corrington, Kip A, MD Follow up in 1 week(s).   Specialty: Family Medicine Contact information: [place] [address] [place] [address] [phone]        Ellis Parents, MD .   Specialty: Internal Medicine Contact information: [place] [address] [phone]               Allergies  Allergen Reactions  . Brilinta [Ticagrelor] Other (See Comments)    HISTORY OF A BRAIN BLEED- was told by an MD to not take   . Tramadol Other (See Comments)    SEIZURES and "Staring off spells"  . Atorvastatin Other (See Comments)    MYALGIAS  . Ezetimibe Other (See Comments)    Muscle pain  . Morphine And Related Nausea And Vomiting    Consultations:  Cardiology   Procedures/Studies: DG Chest 2 View  Result Date: 01/11/2020 CLINICAL DATA:  Chest pain. EXAM: CHEST - 2 VIEW COMPARISON:  Jun 20, 2018 FINDINGS: The heart, hila, and mediastinum are normal. No pneumothorax. No nodules or masses. No focal infiltrates. IMPRESSION: No active cardiopulmonary disease. Electronically Signed   By: Dorise Bullion III M.D   On: 01/11/2020 16:02   CT Angio Chest PE W/Cm &/Or Wo Cm  Result Date: 01/11/2020 CLINICAL DATA:  Shortness of breath.  Fall. EXAM: CT ANGIOGRAPHY CHEST WITH CONTRAST TECHNIQUE: Multidetector CT imaging of the chest was performed using the standard protocol during bolus administration of intravenous contrast. Multiplanar CT image reconstructions and MIPs were obtained to evaluate the vascular anatomy. CONTRAST:  53mL OMNIPAQUE IOHEXOL 350 MG/ML SOLN COMPARISON:  Chest CT 12/20/2014. FINDINGS: Cardiovascular: The heart size is normal. No substantial pericardial effusion. Coronary artery calcification is evident. No thoracic aortic aneurysm. There is no large central pulmonary embolus. No lobar or segmental embolus. A tiny nonocclusive subsegmental pulmonary embolus is identified in the left upper lobe (axial image 179 of series 7 and best demonstrated on sagittal images 128-131 of series 9). No other findings of acute pulmonary embolus. Mediastinum/Nodes: No mediastinal lymphadenopathy. There is no hilar lymphadenopathy. The esophagus has normal imaging features. There is no axillary lymphadenopathy. Lungs/Pleura: Centrilobular and paraseptal emphysema evident. No  evidence for pneumothorax or pleural effusion. 5 mm right pulmonary nodule on 69/6 is stable since prior study consistent with benign etiology. Tiny perifissural nodule in the right lower lobe on 70/6 is also stable. No focal airspace consolidation. Upper Abdomen: 15 mm right adrenal nodule has average attenuation of -3 Hounsfield units, consistent with adenoma. Musculoskeletal: No worrisome lytic or sclerotic osseous abnormality. Review of the MIP images confirms the above findings. IMPRESSION: 1. Tiny nonocclusive subsegmental pulmonary embolus in the left upper lobe. No other acute pulmonary embolus on the current exam. 2. Tiny right lung nodule stable since 2016 consistent with benign etiology. 3. 15 mm right adrenal adenoma. 4. Emphysema (ICD10-J43.9). Electronically Signed   By: Misty Stanley M.D.   On: 01/11/2020 20:11   ECHOCARDIOGRAM COMPLETE  Result Date: 01/13/2020    ECHOCARDIOGRAM  REPORT   Patient Name:   Peter Barnett Date of Exam: 01/13/2020 Medical Rec #:  [id number]       Height:       71.0 in Accession #:    [id number]      Weight:       253.9 lb Date of Birth:  [birth year]        BSA:          2.334 m Patient Age:    59 years        BP:           133/79 mmHg Patient Gender: M               HR:           67 bpm. Exam Location:  Inpatient Procedure: 2D Echo Indications:    atrial fibrillation 427.31  History:        Patient has prior history of Echocardiogram examinations, most                 recent 06/21/2018. CAD, COPD, Arrythmias:Atrial Fibrillation;                 Risk Factors:Hypertension and Dyslipidemia.  Sonographer:    Johny Chess Referring Phys: [place]  1. Left ventricular ejection fraction, by estimation, is 55% with beat to beat variability. The left ventricle has low normal function. Left ventricular endocardial border not optimally defined to evaluate regional wall motion. There is mild left ventricular hypertrophy. Left ventricular diastolic  parameters are indeterminate, however medial e' is 9 cm/s, and diastolic function is likely grossly preserved.  2. Right ventricular systolic function is mildly reduced. The right ventricular size is normal. Tricuspid regurgitation signal is inadequate for assessing PA pressure.  3. The mitral valve is normal in structure. No evidence of mitral valve regurgitation. No evidence of mitral stenosis.  4. The aortic valve is tricuspid. There is mild calcification of the aortic valve. Aortic valve regurgitation is trivial. No aortic stenosis is present.  5. The inferior vena cava is dilated in size with >50% respiratory variability, suggesting right atrial pressure of 8 mmHg. FINDINGS  Left Ventricle: Left ventricular ejection fraction, by estimation, is 55%. The left ventricle has low normal function. Left ventricular endocardial border not optimally defined to evaluate regional wall motion. The left ventricular internal cavity size was normal in size. There is mild left ventricular hypertrophy. Left ventricular diastolic parameters are indeterminate. Right Ventricle: The right ventricular size is normal. No increase in right ventricular wall thickness. Right ventricular systolic function is mildly reduced. Tricuspid regurgitation signal is inadequate for assessing PA pressure. Left Atrium: Left atrial size was normal in size. Right Atrium: Right atrial size was normal in size. Pericardium: There is no evidence of pericardial effusion. Mitral Valve: The mitral valve is normal in structure. No evidence of mitral valve regurgitation. No evidence of mitral valve stenosis. Tricuspid Valve: The tricuspid valve is normal in structure. Tricuspid valve regurgitation is trivial. No evidence of tricuspid stenosis. Aortic Valve: The aortic valve is tricuspid. There is mild calcification of the aortic valve. Aortic valve regurgitation is trivial. No aortic stenosis is present. Pulmonic Valve: The pulmonic valve was grossly normal.  Pulmonic valve regurgitation is trivial. No evidence of pulmonic stenosis. Aorta: The aortic root is normal in size and structure. Venous: The inferior vena cava is dilated in size with greater than 50% respiratory variability, suggesting right atrial pressure of 8 mmHg. IAS/Shunts: No  atrial level shunt detected by color flow Doppler.  LEFT VENTRICLE PLAX 2D LVIDd:         4.70 cm LVIDs:         3.70 cm LV PW:         1.30 cm LV IVS:        1.10 cm LVOT diam:     2.00 cm LVOT Area:     3.14 cm  RIGHT VENTRICLE         IVC TAPSE (M-mode): 1.9 cm  IVC diam: 2.10 cm LEFT ATRIUM             Index       RIGHT ATRIUM           Index LA diam:        3.70 cm 1.59 cm/m  RA Area:     17.80 cm LA Vol (A2C):   65.1 ml 27.90 ml/m RA Volume:   47.50 ml  20.35 ml/m LA Vol (A4C):   71.0 ml 30.42 ml/m LA Biplane Vol: 70.6 ml 30.25 ml/m   AORTA Ao Root diam: 3.60 cm Ao Asc diam:  3.60 cm  SHUNTS Systemic Diam: 2.00 cm Cherlynn Kaiser MD Electronically signed by Cherlynn Kaiser MD Signature Date/Time: 01/13/2020/5:08:18 PM    Final    TEE with cardioversion.   Subjective: Patient was seen and examined at bedside.  Overnight events noted.  Patient underwent successful cardioversion,  converted into sinus rhythm.   Patient feels better,  cardiology signed off. Patient is being discharged home.  Discharge Exam: Vitals:   01/14/20 1249 01/14/20 1452  BP: 136/76 104/64  Pulse: 60   Resp: 19 17  Temp: 98.4 F (36.9 C) 98.3 F (36.8 C)  SpO2: 95%    Vitals:   01/14/20 1215 01/14/20 1230 01/14/20 1249 01/14/20 1452  BP: 120/72 117/79 136/76 104/64  Pulse: 70 70 60   Resp: 18 14 19 17   Temp:   98.4 F (36.9 C) 98.3 F (36.8 C)  TempSrc:   Oral Oral  SpO2: 94% 94% 95%   Weight:      Height:        General: Pt is alert, awake, not in acute distress Cardiovascular: RRR, S1/S2 +, no rubs, no gallops Respiratory: CTA bilaterally, no wheezing, no rhonchi Abdominal: Soft, NT, ND, bowel sounds  + Extremities: no edema, no cyanosis    The results of significant diagnostics from this hospitalization (including imaging, microbiology, ancillary and laboratory) are listed below for reference.     Microbiology: Recent Results (from the past 240 hour(s))  Resp Panel by RT-PCR (Flu A&B, Covid) Nasopharyngeal Swab     Status: None   Collection Time: 01/11/20 10:12 PM   Specimen: Nasopharyngeal Swab; Nasopharyngeal(NP) swabs in vial transport medium  Result Value Ref Range Status   SARS Coronavirus 2 by RT PCR NEGATIVE NEGATIVE Final    Comment: (NOTE) SARS-CoV-2 target nucleic acids are NOT DETECTED.  The SARS-CoV-2 RNA is generally detectable in upper respiratory specimens during the acute phase of infection. The lowest concentration of SARS-CoV-2 viral copies this assay can detect is 138 copies/mL. A negative result does not preclude SARS-Cov-2 infection and should not be used as the sole basis for treatment or other patient management decisions. A negative result may occur with  improper specimen collection/handling, submission of specimen other than nasopharyngeal swab, presence of viral mutation(s) within the areas targeted by this assay, and inadequate number of viral copies(<138 copies/mL). A negative result must  be combined with clinical observations, patient history, and epidemiological information. The expected result is Negative.  Fact Sheet for Patients:  EntrepreneurPulse.com.au  Fact Sheet for Healthcare Providers:  IncredibleEmployment.be  This test is no t yet approved or cleared by the [place] FDA and  has been authorized for detection and/or diagnosis of SARS-CoV-2 by FDA under an Emergency Use Authorization (EUA). This EUA will remain  in effect (meaning this test can be used) for the duration of the COVID-19 declaration under Section 564(b)(1) of the Act, 21 U.S.C.section 360bbb-3(b)(1), unless the authorization  is terminated  or revoked sooner.       Influenza A by PCR NEGATIVE NEGATIVE Final   Influenza B by PCR NEGATIVE NEGATIVE Final    Comment: (NOTE) The Xpert Xpress SARS-CoV-2/FLU/RSV plus assay is intended as an aid in the diagnosis of influenza from Nasopharyngeal swab specimens and should not be used as a sole basis for treatment. Nasal washings and aspirates are unacceptable for Xpert Xpress SARS-CoV-2/FLU/RSV testing.  Fact Sheet for Patients: EntrepreneurPulse.com.au  Fact Sheet for Healthcare Providers: IncredibleEmployment.be  This test is not yet approved or cleared by the [place] FDA and has been authorized for detection and/or diagnosis of SARS-CoV-2 by FDA under an Emergency Use Authorization (EUA). This EUA will remain in effect (meaning this test can be used) for the duration of the COVID-19 declaration under Section 564(b)(1) of the Act, 21 U.S.C. section 360bbb-3(b)(1), unless the authorization is terminated or revoked.  Performed at [facility], [place] [address] [place], [place] [address]      Labs: BNP (last 3 results) Recent Labs    01/11/20 1745  BNP 144.3*   Basic Metabolic Panel: Recent Labs  Lab 01/11/20 1521 01/12/20 0352 01/13/20 0336 01/14/20 0902  NA 139 138 137 135  K 3.7 3.6 3.5 3.8  CL 107 105 103 105  CO2 17* 22 24 21*  GLUCOSE 96 137* 121* 106*  BUN 12 10 9 9   CREATININE 0.87 0.86 0.82 0.81  CALCIUM 9.0 9.3 9.1 8.9  MG  --   --  2.1 1.9  PHOS  --   --  3.4  --    Liver Function Tests: Recent Labs  Lab 01/13/20 0336  AST 16  ALT 29  ALKPHOS 91  BILITOT 1.0  PROT 6.0*  ALBUMIN 3.5   No results for input(s): LIPASE, AMYLASE in the last 168 hours. No results for input(s): AMMONIA in the last 168 hours. CBC: Recent Labs  Lab 01/11/20 1521 01/12/20 0352  WBC 9.2 10.0  HGB 17.0 16.5  HCT 54.3* 50.6  MCV 94.6 90.7  PLT 162 185   Cardiac Enzymes: No results for  input(s): CKTOTAL, CKMB, CKMBINDEX, TROPONINI in the last 168 hours. BNP: Invalid input(s): POCBNP CBG: No results for input(s): GLUCAP in the last 168 hours. D-Dimer No results for input(s): DDIMER in the last 72 hours. Hgb A1c No results for input(s): HGBA1C in the last 72 hours. Lipid Profile No results for input(s): CHOL, HDL, LDLCALC, TRIG, CHOLHDL, LDLDIRECT in the last 72 hours. Thyroid function studies No results for input(s): TSH, T4TOTAL, T3FREE, THYROIDAB in the last 72 hours.  Invalid input(s): FREET3 Anemia work up No results for input(s): VITAMINB12, FOLATE, FERRITIN, TIBC, IRON, RETICCTPCT in the last 72 hours. Urinalysis    Component Value Date/Time   COLORURINE RED (A) 04/28/2013 2105   APPEARANCEUR TURBID (A) 04/28/2013 2105   LABSPEC 1.019 04/28/2013 2105   PHURINE 7.0 04/28/2013 2105  GLUCOSEU NEGATIVE 04/28/2013 2105   HGBUR LARGE (A) 04/28/2013 2105   BILIRUBINUR neg 05/12/2013 1123   KETONESUR 40 (A) 04/28/2013 2105   PROTEINUR 100 05/12/2013 1123   PROTEINUR 100 (A) 04/28/2013 2105   UROBILINOGEN 0.2 05/12/2013 1123   UROBILINOGEN 1.0 04/28/2013 2105   NITRITE positive 05/12/2013 1123   NITRITE POSITIVE (A) 04/28/2013 2105   LEUKOCYTESUR moderate (2+) 05/12/2013 1123   Sepsis Labs Invalid input(s): PROCALCITONIN,  WBC,  LACTICIDVEN Microbiology Recent Results (from the past 240 hour(s))  Resp Panel by RT-PCR (Flu A&B, Covid) Nasopharyngeal Swab     Status: None   Collection Time: 01/11/20 10:12 PM   Specimen: Nasopharyngeal Swab; Nasopharyngeal(NP) swabs in vial transport medium  Result Value Ref Range Status   SARS Coronavirus 2 by RT PCR NEGATIVE NEGATIVE Final    Comment: (NOTE) SARS-CoV-2 target nucleic acids are NOT DETECTED.  The SARS-CoV-2 RNA is generally detectable in upper respiratory specimens during the acute phase of infection. The lowest concentration of SARS-CoV-2 viral copies this assay can detect is 138 copies/mL. A negative  result does not preclude SARS-Cov-2 infection and should not be used as the sole basis for treatment or other patient management decisions. A negative result may occur with  improper specimen collection/handling, submission of specimen other than nasopharyngeal swab, presence of viral mutation(s) within the areas targeted by this assay, and inadequate number of viral copies(<138 copies/mL). A negative result must be combined with clinical observations, patient history, and epidemiological information. The expected result is Negative.  Fact Sheet for Patients:  EntrepreneurPulse.com.au  Fact Sheet for Healthcare Providers:  IncredibleEmployment.be  This test is no t yet approved or cleared by the [place] FDA and  has been authorized for detection and/or diagnosis of SARS-CoV-2 by FDA under an Emergency Use Authorization (EUA). This EUA will remain  in effect (meaning this test can be used) for the duration of the COVID-19 declaration under Section 564(b)(1) of the Act, 21 U.S.C.section 360bbb-3(b)(1), unless the authorization is terminated  or revoked sooner.       Influenza A by PCR NEGATIVE NEGATIVE Final   Influenza B by PCR NEGATIVE NEGATIVE Final    Comment: (NOTE) The Xpert Xpress SARS-CoV-2/FLU/RSV plus assay is intended as an aid in the diagnosis of influenza from Nasopharyngeal swab specimens and should not be used as a sole basis for treatment. Nasal washings and aspirates are unacceptable for Xpert Xpress SARS-CoV-2/FLU/RSV testing.  Fact Sheet for Patients: EntrepreneurPulse.com.au  Fact Sheet for Healthcare Providers: IncredibleEmployment.be  This test is not yet approved or cleared by the [place] FDA and has been authorized for detection and/or diagnosis of SARS-CoV-2 by FDA under an Emergency Use Authorization (EUA). This EUA will remain in effect (meaning this test can be used)  for the duration of the COVID-19 declaration under Section 564(b)(1) of the Act, 21 U.S.C. section 360bbb-3(b)(1), unless the authorization is terminated or revoked.  Performed at [facility], [place] [address], [place], [place] [address]      Time coordinating discharge: Over 30 minutes  SIGNED:   Shawna Clamp, MD  [facility] 01/14/2020, 3:20 PM Pager   If 7PM-7AM, please contact night-coverage [email or web address]

## 2020-01-15 ENCOUNTER — Ambulatory Visit: Payer: Medicare HMO | Admitting: Cardiovascular Disease

## 2020-01-16 ENCOUNTER — Ambulatory Visit: Payer: Medicare HMO | Admitting: Cardiovascular Disease

## 2020-01-22 DIAGNOSIS — Z0100 Encounter for examination of eyes and vision without abnormal findings: Secondary | ICD-10-CM | POA: Diagnosis not present

## 2020-01-24 DIAGNOSIS — I480 Paroxysmal atrial fibrillation: Secondary | ICD-10-CM | POA: Diagnosis not present

## 2020-01-24 DIAGNOSIS — E7800 Pure hypercholesterolemia, unspecified: Secondary | ICD-10-CM | POA: Diagnosis not present

## 2020-01-24 DIAGNOSIS — I10 Essential (primary) hypertension: Secondary | ICD-10-CM | POA: Diagnosis not present

## 2020-02-10 NOTE — Progress Notes (Unsigned)
Cardiology Office Note:   Date:  02/11/2020  NAME:  Peter Barnett    MRN: [id number] DOB:  October 31, 1960   PCP:  [name], MD  Cardiologist:  Ellis Parents, MD   Referring MD: [name], MD   Chief Complaint  Patient presents with  . Atrial Fibrillation   History of Present Illness:   Peter Barnett is a 60 y.o. male with a hx of CAD, persistent Afib/flutter, ICH, HTN, COPD, HLD who presents for follow-up. Recent admission for atrial flutter in December. Not on long-term AC due to [place] history.  He reports he is doing fairly well since leaving the hospital.  No further episodes of atrial fibrillation.  He has a cardia mobile device and he is monitoring this.  He denies any chest pain.  He does report that he is fatigued constantly.  He does snore.  Sleep apnea has been a concern.  He needs to complete a home sleep study.  EKG today demonstrates sinus bradycardia.  He reports he is interested in the [place] device.  He is on Eliquis and will stop this in the next 2 days.  This will be 4 weeks of anticoagulation.  He is not a candidate for long-term anticoagulation but is able to tolerate short courses of this.  I think he would be a great candidate for this procedure.  He has no chest pain.  His CAD has been stable.  His most recent LDL cholesterol in April 2021 was 102.  He has attempted to be on a PCSK9 inhibitor but cost was prohibitive several years ago.  He would like to reevaluate bempedoic acid in the next few months.    He also reports increased lower extremity edema.  Lasix is not helping as much as he used to.  We discussed switching to torsemide.  He does need repeat TSH as well as a BNP.   Problem List 1. CAD -PCI to LCX/LAD 2009 -DES to D1 2017 -99% dRCA with L to R collaterals  2. ICH/CVA 2016 -R frontal AVM s/p craniotomy 1981 3. COPD/Tobacco abuse  4. HTN 5. HLD -T chol 172, HDL 47, LDL 102, TG 127 6. Persistent Afib/flutter -TEE/DCCV 06/21/2018  (atrial fibrillation) -TEE/DCCV 01/14/2020 (atrial flutter) -CHADSVASC = 2 (HTN, CAD) -not on long-term AC due to high bleeding risk/ICH  Past Medical History: Past Medical History:  Diagnosis Date  . CAD (coronary artery disease)    a. MI/DES to Cx 2009 with staged PCI of LAD with DES and distal RCA with BMS. b. NSTEMI 12/2015 s/p DES to D1, known occlusion of dRCA tx medically.  . Chronic lower back pain    Still with recurrent left sided LBP with paresthesias down left leg --primarily with activities: pain pill use is avg <90 tabs per mo  . COPD (chronic obstructive pulmonary disease) ([place])    NOTED on CT chest 11/2013 done for lung ca screening.  Hosp for acute exac 11/2014.  Spirometry not supportive of this dx as of pulm eval 2017, though.  Spireva trial by Dr. Elsworth Soho 02/2015.  Marland Kitchen GERD (gastroesophageal reflux disease)   . Heart attack ([place]) 12/12/2015  . History of adenomatous polyp of colon 02/2009;02/2014   Recall 5 yrs ([facility])  . History of kidney stones   . HTN (hypertension)   . Hyperlipidemia   . Intracranial hemorrhage ([place]) 12/27/14   small acute hemorrhage in gliotic medial R frontal lobe  . Memory loss   .  MI (myocardial infarction) ([place]) 2009   "just had arm pain; never any chest pain"  . Migraine syndrome 10/07/2011    a couple per month usually, but worsening 2017 so Dr. Krista Blue adjusted med  . 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 Seizures ([place])    last one was fall of 2016.  Complex partial sz d/o.  Marland Kitchen Short-term memory loss 10/07/2011   "post brain OR & from his seizures"--worsening 2017, Dr. Krista Blue in neurology doing w/u.  Marland Kitchen Stroke ([place]) 12/2014  . Tobacco abuse   . Urethral stricture since 1981   Recurrent dilation has been required [facility] urology)    Past Surgical History: Past Surgical History:  Procedure Laterality Date  . BACK SURGERY  11/25/2015  . CARDIAC CATHETERIZATION  2011   [place], records unavailable: pt reports "normal".   . CARDIAC CATHETERIZATION N/A 12/15/2015   Procedure: Left Heart Cath and Coronary Angiography;  Surgeon: Peter M Martinique, MD;  Location: [facility] CV LAB;  Service: Cardiovascular;  Laterality: N/A;  . CARDIAC CATHETERIZATION N/A 12/15/2015   Procedure: Coronary Stent Intervention;  Surgeon: Peter M Martinique, MD;  Location: [facility] LAB;  Service: Cardiovascular;  Laterality: N/A;  . CARDIOVERSION N/A 06/21/2018   Procedure: CARDIOVERSION;  Surgeon: [name], MD;  Location: [facility] ENDOSCOPY;  Service: Cardiovascular;  Laterality: N/A;  . CARDIOVERSION N/A 01/14/2020   Procedure: CARDIOVERSION;  Surgeon: Buford Dresser, MD;  Location: [facility] ENDOSCOPY;  Service: Cardiovascular;  Laterality: N/A;  . COLONOSCOPY W/ POLYPECTOMY  02/2009;02/2014   Recall 5 yr (aden poly, hyperplast polyp, int and ext hemorr).  Next recall is 02/2019.  Marland Kitchen CORONARY ANGIOPLASTY WITH STENT PLACEMENT  2009   3 DES to LCx; still with known distal RCA/PDA occlusion.  EF 60% by LV-gram.  . CRANIOTOMY FOR AVM  1981   Dx'd after pt began having seizures.  . CT ANGIOGRAM (CEREBRAL)  12/31/14   NORMAL--plan per neuro is to repeat this in 3 mo.  Pt states it was repeated Spring 2017 and was "fine"  . CYSTOSCOPY WITH URETHRAL DILATATION N/A 03/27/2019   Procedure: CYSTOSCOPY WITH URETHRAL DILATATION;  Surgeon: Irine Seal, MD;  Location: [facility] ORS;  Service: Urology;  Laterality: N/A;  . LUMBAR [place] SURGERY  2008; 2009   Dr. Joya Salm  . LUMBAR LAMINECTOMY/DECOMPRESSION MICRODISCECTOMY N/A 11/25/2015   Procedure: Lumbar three- four Lumbar four- five Laminectomy/Foraminotomy;  Surgeon: Leeroy Cha, MD;  Location: [facility];  Service: Neurosurgery;  Laterality: N/A;  L3-4 L4-5 Laminectomy/Foraminotomy/possible Lumbar Drain  . TEE WITHOUT CARDIOVERSION N/A 06/21/2018   Procedure: TRANSESOPHAGEAL ECHOCARDIOGRAM (TEE);  Surgeon: Acie Fredrickson Wonda Cheng, MD;  Location: [facility];  Service: Cardiovascular;  Laterality: N/A;  . TEE WITHOUT  CARDIOVERSION N/A 01/14/2020   Procedure: TRANSESOPHAGEAL ECHOCARDIOGRAM (TEE);  Surgeon: Buford Dresser, MD;  Location[facility] ENDOSCOPY;  Service: Cardiovascular;  Laterality: N/A;  . TONSILLECTOMY     "I was little"  . TRANSTHORACIC ECHOCARDIOGRAM  12/07/14   Normal LV size/fxn, EF 60%, normal valves.  Marland Kitchen URETEROSCOPY WITH HOLMIUM LASER LITHOTRIPSY Left 03/27/2019   Procedure: cysto with dilation of uretheral sticture, ureteroscopy and left retrograde pyelogram.;  Surgeon: Irine Seal, MD;  Location: [facility];  Service: Urology;  Laterality: Left;    Current Medications: Current Meds  Medication Sig  . acetaminophen (TYLENOL) 500 MG tablet Take 1 tablet (500 mg total) by mouth every 6 (six) hours as needed for headache. (Patient taking differently: Take 1,000 mg by mouth every 6 (six) hours as needed for headache.)  .  albuterol (PROVENTIL HFA;VENTOLIN HFA) 108 (90 BASE) MCG/ACT inhaler Inhale 2 puffs into the lungs every 6 (six) hours as needed for wheezing or shortness of breath.  Marland Kitchen aspirin EC 81 MG tablet Take 81 mg by mouth at bedtime.   . Cholecalciferol (VITAMIN D3) 50 MCG (2000 UT) TABS Take 2,000 Units by mouth daily.  Marland Kitchen docusate sodium (COLACE) 100 MG capsule Take 100 mg by mouth 2 (two) times daily.   . famotidine (PEPCID) 20 MG tablet Take 20 mg by mouth daily before breakfast.   . HYDROcodone-acetaminophen (NORCO) 10-325 MG per tablet Take 1 tablet by mouth every 8 (eight) hours as needed (for pain).   . metoprolol tartrate (LOPRESSOR) 25 MG tablet Take 25 mg by mouth 2 (two) times daily.  . nitroGLYCERIN (NITROSTAT) 0.4 MG SL tablet Place 1 tablet (0.4 mg total) under the tongue every 5 (five) minutes as needed for chest pain (CP or SOB). (Patient taking differently: Place 0.4 mg under the tongue every 5 (five) minutes as needed for chest pain (OR shortness of breath).)  . oxcarbazepine (TRILEPTAL) 600 MG tablet TAKE ONE TABLET BY MOUTH TWICE DAILY (Patient taking differently:  Take 600 mg by mouth 2 (two) times daily.)  . potassium chloride SA (KLOR-CON M20) 20 MEQ tablet Take 1 tablet (20 mEq total) by mouth daily.  . rosuvastatin (CRESTOR) 40 MG tablet Take 1 tablet (40 mg total) by mouth daily. (Patient taking differently: Take 40 mg by mouth every other day.)  . torsemide (DEMADEX) 20 MG tablet Take 1 tablet (20 mg total) by mouth daily.  . [DISCONTINUED] apixaban (ELIQUIS) 5 MG TABS tablet Take 1 tablet (5 mg total) by mouth 2 (two) times daily.  . [DISCONTINUED] furosemide (LASIX) 20 MG tablet Take 20 mg by mouth 2 (two) times daily.     Allergies:    Brilinta [ticagrelor], Tramadol, Atorvastatin, Ezetimibe, and Morphine and related   Social History: Social History   Socioeconomic History  . Marital status: Married    Spouse name: [name]  . Number of children: 2  . Years of education: 10 th  . Highest education level: Not on file  Occupational History  . Occupation: retired     Fish farm manager: RETIRED    Comment: retired  Tobacco Use  . Smoking status: Former Smoker    Packs/day: 3.00    Years: 27.00    Pack years: 81.00    Types: Cigarettes    Quit date: 02/08/2001    Years since quitting: 19.0  . Smokeless tobacco: Never Used  Vaping Use  . Vaping Use: Never used  Substance and Sexual Activity  . Alcohol use: Yes    Alcohol/week: 7.0 standard drinks    Types: 7 Cans of beer per week    Comment: 12 oz beer   . Drug use: No  . Sexual activity: Yes  Other Topics Concern  . Not on file  Social History Narrative   Patient lives at home with his wife [name]).   Two adult children.   Retired from [employer] approx age 1 due to medical reasons (disabled due to memory issues, seizure d/o and chronic LBP).   Education 10th grade.   Right handed.   Caffeine two cups of tea and two cups of coffee.   Tobacco 45 pack-yr hx, quit approx age 32.  Alcohol: 1-2 beers per night.     No hx of alc or drug problems.   No exercise.   Diet: regular   Social  Determinants  of Health   Financial Resource Strain: Not on file  Food Insecurity: Not on file  Transportation Needs: Not on file  Physical Activity: Not on file  Stress: Not on file  Social Connections: Not on file     Family History: The patient's family history includes Asthma in his father; Cancer in his father; Heart attack in his paternal uncle; Hyperlipidemia in his mother; Hypertension in his father. There is no history of Stroke.  ROS:   All other ROS reviewed and negative. Pertinent positives noted in the HPI.     EKGs/Labs/Other Studies Reviewed:   The following studies were personally reviewed by me today:  EKG:  EKG is ordered today.  The ekg ordered today demonstrates sinus bradycardia, heart rate 57, LVH by voltage, and was personally reviewed by me.   TTE 01/13/2020 1. Left ventricular ejection fraction, by estimation, is 55% with beat to  beat variability. The left ventricle has low normal function. Left  ventricular endocardial border not optimally defined to evaluate regional  wall motion. There is mild left  ventricular hypertrophy. Left ventricular diastolic parameters are  indeterminate, however medial e' is 9 cm/s, and diastolic function is  likely grossly preserved.  2. Right ventricular systolic function is mildly reduced. The right  ventricular size is normal. Tricuspid regurgitation signal is inadequate  for assessing PA pressure.  3. The mitral valve is normal in structure. No evidence of mitral valve  regurgitation. No evidence of mitral stenosis.  4. The aortic valve is tricuspid. There is mild calcification of the  aortic valve. Aortic valve regurgitation is trivial. No aortic stenosis is  present.  5. The inferior vena cava is dilated in size with >50% respiratory  variability, suggesting right atrial pressure of 8 mmHg.   LHC 12/15/2015  1. 2 vessel obstructive CAD    - 95% first diagonal    - 99% distal RCA. This is just distal to prior  stent and appears chronic with left to right collaterals. The vessel is very small in this area and involves with PLOM and PDA bifurcation 2. Continued patency of stents in the proximal LAD and mid LCx. 3. Normal LV function with mildly elevated LVEDP 4. Successful stenting of the first diagonal with DES.   Recent Labs: 01/11/2020: B Natriuretic Peptide 136.0 01/12/2020: Hemoglobin 16.5; Platelets 185 01/13/2020: ALT 29 01/14/2020: BUN 9; Creatinine, Ser 0.81; Magnesium 1.9; Potassium 3.8; Sodium 135   Recent Lipid Panel    Component Value Date/Time   CHOL 149 02/06/2016 0749   TRIG 99 02/06/2016 0749   HDL 43 02/06/2016 0749   CHOLHDL 3.5 02/06/2016 0749   VLDL 20 02/06/2016 0749   LDLCALC 86 02/06/2016 0749    Physical Exam:   VS:  BP 130/74   Pulse (!) 57   Ht 5\' 11"  (1.803 m)   Wt 260 lb (117.9 kg)   SpO2 98%   BMI 36.26 kg/m    Wt Readings from Last 3 Encounters:  02/11/20 260 lb (117.9 kg)  01/14/20 254 lb 6.4 oz (115.4 kg)  03/27/19 255 lb 8 oz (115.9 kg)    General: Well nourished, well developed, in no acute distress Head: Atraumatic, normal size  Eyes: PEERLA, EOMI  Neck: Supple, no JVD Endocrine: No thryomegaly Cardiac: Normal S1, S2; RRR; no murmurs, rubs, or gallops Lungs: Clear to auscultation bilaterally, no wheezing, rhonchi or rales  Abd: Soft, nontender, no hepatomegaly  Ext: Trace edema Musculoskeletal: No deformities, BUE and BLE strength normal and  equal Skin: Warm and dry, no rashes   Neuro: Alert and oriented to person, place, time, and situation, CNII-XII grossly intact, no focal deficits  Psych: Normal mood and affect   ASSESSMENT:   OSYRIS VOLLRATH is a 60 y.o. male who presents for the following: 1. Persistent atrial fibrillation ([place])   2. Coronary artery disease involving native coronary artery of native heart without angina pectoris   3. Chronic diastolic heart failure ([place])   4. SOB (shortness of breath) on exertion   5. Mixed  hyperlipidemia   6. HTN (hypertension), benign   7. Snoring   8. Chronic fatigue   9. Obesity (BMI 30-39.9)     PLAN:   1. Persistent atrial fibrillation (H[facility]) -TEE/DCCV 06/21/2018 (atrial fibrillation) -TEE/DCCV 01/14/2020 (atrial flutter) -CHADSVASC = 2 (HTN, CAD) -He is maintaining sinus rhythm.  He will continue metoprolol.  He may be a candidate for atrial fibrillation flutter ablation.  See discussion below. -He is not a candidate for long-term anticoagulation due to history of AVMs and craniotomy in the 1980s as well as intracerebral hemorrhage related to his AVMs in 2016. -Recent cardioversion on 01/14/2020.  He will complete 1 month of anticoagulation on 02/14/2020.  He will then stop anticoagulation as he cannot be on it long-term. -I have seen Srithik Quirin Weygandt is a 60 y.o. male in the office today who is being considered for a Watchman left atrial appendage closure device.  He has a history of atrial fibrillation.  This patients CHA2DS2-VASc Score and unadjusted Ischemic Stroke Rate (% per year) is equal to 2.2 % stroke rate/year from a score of 2 which necessitates long term oral anticoagulation to prevent stroke.  Unfortunately, He is not felt to be a long term Warfarin candidate secondary to intracerebral hemorrhage.  The patients chart has been reviewed and I feel that they would be a candidate for short term oral anticoagulation.  Procedural risks for the Watchman implant have been reviewed with the patient including a 1% risk of stroke, 2% risk of perforation, 0.1% risk of device embolization.  Given the patient's poor candidacy for long-term oral anticoagulation and ability to tolerate short term oral anticoagulation I have recommended the watchman left atrial appendage closure system. -I have recommended referral to Dr. Lars Mage for further [facility] evaluation.  He also could consider him for an atrial fibrillation and flutter ablation.  We are going to look for sleep apnea and  treat his diastolic heart failure a bit better.  These could be contributing to his atrial fibrillation and flutter.  2. Coronary artery disease involving native coronary artery of native heart without angina pectoris -PCI to LCX/LAD 2009 -DES to D1 2017 -99% dRCA with L to R collaterals  -No symptoms of angina.  He will go back on aspirin once he stops Eliquis as above.  He cannot be on both agents due to history of cerebral hemorrhage. -He cannot be on Zetia.  He is on Crestor 40 mg every other day.  A PCSK9 inhibitor was attempted several years ago but cost was prohibitive.  We did discuss bempedoic acid.  He would like to think about this and we will discuss it further and see him back in 3 months.  His most recent LDL cholesterol is 102.  His goal is less than 70.  3. Chronic diastolic heart failure ([place]) 4. SOB (shortness of breath) on exertion -He appears a bit volume up today.  Stop Lasix.  Start torsemide 20 mg a day.  I would like to check a BNP.  He also will take 20 mEq of potassium with his torsemide.  5. Mixed hyperlipidemia -Continue Crestor 40 mg every other day.  We will discussed bempedoic acid at his next visit.  He may also qualify for better cost on a PCSK9 inhibitor.  6. HTN (hypertension), benign -Well-controlled today.  7. Snoring 8. Chronic fatigue 9. Obesity (BMI 30-39.9) -He describes symptoms of snoring and fatigue.  He has had atrial fibrillation and flutter episodes.  He needs a home sleep study.  Disposition: Return in about 3 months (around 05/11/2020).  Medication Adjustments/Labs and Tests Ordered: Current medicines are reviewed at length with the patient today.  Concerns regarding medicines are outlined above.  Orders Placed This Encounter  Procedures  . TSH  . Brain natriuretic peptide  . Ambulatory referral to Cardiac Electrophysiology  . EKG 12-Lead  . Home sleep test   Meds ordered this encounter  Medications  . torsemide (DEMADEX) 20 MG  tablet    Sig: Take 1 tablet (20 mg total) by mouth daily.    Dispense:  90 tablet    Refill:  1  . potassium chloride SA (KLOR-CON M20) 20 MEQ tablet    Sig: Take 1 tablet (20 mEq total) by mouth daily.    Dispense:  90 tablet    Refill:  3    Patient Instructions  Medication Instructions:  Stop Lasix Start Torsemide 20 mg daily  Take Potassium 20 MEQ with Torsemide  Stop Eliquis Start Aspirin 81 mg daily   *If you need a refill on your cardiac medications before your next appointment, please call your pharmacy*   Lab Work: TSH, BNP today   If you have labs (blood work) drawn today and your tests are completely normal, you will receive your results only by: Marland Kitchen MyChart Message (if you have MyChart) OR . A paper copy in the mail If you have any lab test that is abnormal or we need to change your treatment, we will call you to review the results.   Testing/Procedures: Your physician has recommended that you have a sleep study. This test records several body functions during sleep, including: brain activity, eye movement, oxygen and carbon dioxide blood levels, heart rate and rhythm, breathing rate and rhythm, the flow of air through your mouth and nose, snoring, body muscle movements, and chest and belly movement.   Follow-Up: At [facility], you and your health needs are our priority.  As part of our continuing mission to provide you with exceptional heart care, we have created designated Provider Care Teams.  These Care Teams include your primary Cardiologist (physician) and Advanced Practice Providers (APPs -  Physician Assistants and Nurse Practitioners) who all work together to provide you with the care you need, when you need it.  We recommend signing up for the patient portal called "MyChart".  Sign up information is provided on this After Visit Summary.  MyChart is used to connect with patients for Virtual Visits (Telemedicine).  Patients are able to view lab/test  results, encounter notes, upcoming appointments, etc.  Non-urgent messages can be sent to your provider as well.   To learn more about what you can do with MyChart, go to [email or web address].    Your next appointment:   3 month(s)  The format for your next appointment:   In Person  Provider:   Eleonore Chiquito, MD   Other Instructions Referral to EP- they will contact you to set up an appointment.  Signed, Lenna Gilford. Flora Lipps, MD [facility]  [address] [place], [place] [address] [phone]  02/11/2020 12:02 PM

## 2020-02-11 ENCOUNTER — Ambulatory Visit: Payer: Medicare HMO | Admitting: Cardiovascular Disease

## 2020-02-11 ENCOUNTER — Encounter: Payer: Self-pay | Admitting: Cardiovascular Disease

## 2020-02-11 ENCOUNTER — Other Ambulatory Visit: Payer: Self-pay

## 2020-02-11 VITALS — BP 130/74 | HR 57 | Ht 71.0 in | Wt 260.0 lb

## 2020-02-11 DIAGNOSIS — I4819 Other persistent atrial fibrillation: Secondary | ICD-10-CM | POA: Diagnosis not present

## 2020-02-11 DIAGNOSIS — I10 Essential (primary) hypertension: Secondary | ICD-10-CM

## 2020-02-11 DIAGNOSIS — R5382 Chronic fatigue, unspecified: Secondary | ICD-10-CM

## 2020-02-11 DIAGNOSIS — I2510 Atherosclerotic heart disease of native coronary artery without angina pectoris: Secondary | ICD-10-CM

## 2020-02-11 DIAGNOSIS — R0683 Snoring: Secondary | ICD-10-CM | POA: Diagnosis not present

## 2020-02-11 DIAGNOSIS — E782 Mixed hyperlipidemia: Secondary | ICD-10-CM | POA: Diagnosis not present

## 2020-02-11 DIAGNOSIS — E669 Obesity, unspecified: Secondary | ICD-10-CM

## 2020-02-11 DIAGNOSIS — R0602 Shortness of breath: Secondary | ICD-10-CM | POA: Diagnosis not present

## 2020-02-11 DIAGNOSIS — I5032 Chronic diastolic (congestive) heart failure: Secondary | ICD-10-CM | POA: Diagnosis not present

## 2020-02-11 MED ORDER — POTASSIUM CHLORIDE CRYS ER 20 MEQ PO TBCR: 20.0000 meq | EXTENDED_RELEASE_TABLET | Freq: Every day | ORAL | 3 refills | Status: DC

## 2020-02-11 MED ORDER — TORSEMIDE 20 MG PO TABS: 20.0000 mg | ORAL_TABLET | Freq: Every day | ORAL | 1 refills | Status: DC

## 2020-02-11 NOTE — Patient Instructions (Addendum)
Medication Instructions:  Stop Lasix Start Torsemide 20 mg daily  Take Potassium 20 MEQ with Torsemide  Stop Eliquis Start Aspirin 81 mg daily   *If you need a refill on your cardiac medications before your next appointment, please call your pharmacy*   Lab Work: TSH, BNP today   If you have labs (blood work) drawn today and your tests are completely normal, you will receive your results only by: Marland Kitchen MyChart Message (if you have MyChart) OR . A paper copy in the mail If you have any lab test that is abnormal or we need to change your treatment, we will call you to review the results.   Testing/Procedures: Your physician has recommended that you have a sleep study. This test records several body functions during sleep, including: brain activity, eye movement, oxygen and carbon dioxide blood levels, heart rate and rhythm, breathing rate and rhythm, the flow of air through your mouth and nose, snoring, body muscle movements, and chest and belly movement.   Follow-Up: At [facility], you and your health needs are our priority.  As part of our continuing mission to provide you with exceptional heart care, we have created designated Provider Care Teams.  These Care Teams include your primary Cardiologist (physician) and Advanced Practice Providers (APPs -  Physician Assistants and Nurse Practitioners) who all work together to provide you with the care you need, when you need it.  We recommend signing up for the patient portal called "MyChart".  Sign up information is provided on this After Visit Summary.  MyChart is used to connect with patients for Virtual Visits (Telemedicine).  Patients are able to view lab/test results, encounter notes, upcoming appointments, etc.  Non-urgent messages can be sent to your provider as well.   To learn more about what you can do with MyChart, go to [email or web address].    Your next appointment:   3 month(s)  The format for your next appointment:   In  Person  Provider:   Lennie Odor, MD   Other Instructions Referral to EP- they will contact you to set up an appointment.

## 2020-02-12 LAB — BRAIN NATRIURETIC PEPTIDE: BNP: 25.3 pg/mL (ref 0.0–100.0)

## 2020-02-12 LAB — TSH: TSH: 1.04 u[IU]/mL (ref 0.450–4.500)

## 2020-02-26 ENCOUNTER — Ambulatory Visit: Payer: Medicare HMO | Admitting: Physician Assistant

## 2020-02-26 ENCOUNTER — Encounter: Payer: Self-pay | Admitting: Physician Assistant

## 2020-02-26 ENCOUNTER — Other Ambulatory Visit: Payer: Self-pay

## 2020-02-26 ENCOUNTER — Institutional Professional Consult (permissible substitution): Payer: Medicare HMO | Admitting: Cardiology

## 2020-02-26 DIAGNOSIS — Z87898 Personal history of other specified conditions: Secondary | ICD-10-CM

## 2020-02-26 DIAGNOSIS — L821 Other seborrheic keratosis: Secondary | ICD-10-CM

## 2020-02-26 DIAGNOSIS — Z1283 Encounter for screening for malignant neoplasm of skin: Secondary | ICD-10-CM

## 2020-02-26 DIAGNOSIS — Z86018 Personal history of other benign neoplasm: Secondary | ICD-10-CM

## 2020-02-26 NOTE — Progress Notes (Signed)
   Follow-Up Visit   Subjective  Peter Barnett is a 60 y.o. male who presents for the following: Annual Exam (RIGHT CHEEK AND BACK).   The following portions of the chart were reviewed this encounter and updated as appropriate:      Objective  Well appearing patient in no apparent distress; mood and affect are within normal limits.  All skin waist up examined.  Objective  Chest - Medial [facility]): Dyspigmented scar.   Objective  Head to Toe: No atypical nevi Head to toe exam today no signs of atypical moles, melanoma or non mole skin cancer.  Objective  Left Lower Back, Left Upper Back, Mid Back, Right Lower Back (2), Right Lower Leg - Anterior, Right Upper Back (2): Stuck-on, waxy papules and plaques.    Assessment & Plan  History of atypical nevus Chest - Medial ([facility])  observe  Skin exam for malignant neoplasm Head to Toe  Yearly skin exams  Seborrheic keratosis (8) Right Lower Leg - Anterior; Left Upper Back; Right Upper Back (2); Left Lower Back; Right Lower Back (2); Mid Back  Benign okay to leave unless patient wants removed.    I, Gladine Plude, PA-C, have reviewed all documentation's for this visit.  The documentation on 02/26/20 for the exam, diagnosis, procedures and orders are all accurate and complete.

## 2020-03-10 ENCOUNTER — Other Ambulatory Visit: Payer: Self-pay

## 2020-03-10 ENCOUNTER — Ambulatory Visit: Payer: Medicare HMO | Admitting: Cardiology

## 2020-03-10 VITALS — BP 128/72 | HR 60 | Ht 71.0 in | Wt 258.0 lb

## 2020-03-10 DIAGNOSIS — I2510 Atherosclerotic heart disease of native coronary artery without angina pectoris: Secondary | ICD-10-CM | POA: Diagnosis not present

## 2020-03-10 DIAGNOSIS — Z8679 Personal history of other diseases of the circulatory system: Secondary | ICD-10-CM

## 2020-03-10 DIAGNOSIS — Q282 Arteriovenous malformation of cerebral vessels: Secondary | ICD-10-CM | POA: Diagnosis not present

## 2020-03-10 DIAGNOSIS — I4819 Other persistent atrial fibrillation: Secondary | ICD-10-CM

## 2020-03-10 NOTE — Patient Instructions (Addendum)
Medication Instructions:  Your physician recommends that you continue on your current medications as directed. Please refer to the Current Medication list given to you today.  You will start Eliquis 5mg  bid 3 weeks prior to ablation.  We will send this medication in once a date has been secured.  *If you need a refill on your cardiac medications before your next appointment, please call your pharmacy*   Lab Work: None ordered.  If you have labs (blood work) drawn today and your tests are completely normal, you will receive your results only by: Marland Kitchen MyChart Message (if you have MyChart) OR . A paper copy in the mail If you have any lab test that is abnormal or we need to change your treatment, we will call you to review the results.   Testing/Procedures: Your physician has recommended that you have an ablation. Catheter ablation is a medical procedure used to treat some cardiac arrhythmias (irregular heartbeats). During catheter ablation, a long, thin, flexible tube is put into a blood vessel in your groin (upper thigh), or neck. This tube is called an ablation catheter. It is then guided to your heart through the blood vessel. Radio frequency waves destroy small areas of heart tissue where abnormal heartbeats may cause an arrhythmia to start. Please see the instruction sheet given to you today.    Follow-Up: At [facility], you and your health needs are our priority.  As part of our continuing mission to provide you with exceptional heart care, we have created designated Provider Care Teams.  These Care Teams include your primary Cardiologist (physician) and Advanced Practice Providers (APPs -  Physician Assistants and Nurse Practitioners) who all work together to provide you with the care you need, when you need it.  We recommend signing up for the patient portal called "MyChart".  Sign up information is provided on this After Visit Summary.  MyChart is used to connect with patients for  Virtual Visits (Telemedicine).  Patients are able to view lab/test results, encounter notes, upcoming appointments, etc.  Non-urgent messages can be sent to your provider as well.   To learn more about what you can do with MyChart, go to [email or web address].    Your next appointment:   Ablation and Watchman procedures to be scheduled. We will contact you for scheduling.

## 2020-03-10 NOTE — Progress Notes (Signed)
Electrophysiology Office Note:    Date:  03/10/2020   ID:  Peter Barnett, DOB 24-Dec-1960, MRN [id number]  PCP:  [name], MD  [facility] Cardiologist:  Ellis Parents, MD  [facility] Electrophysiologist:  [name], MD   Referring MD: [name], MD   Chief Complaint: Paroxysmal atrial fibrillation, history of intracranial hemorrhage  History of Present Illness:    Peter Barnett is a 60 y.o. male who presents for an evaluation of paroxysmal atrial fibrillation and history of intracranial hemorrhage at the request of Dr. [name]. Their medical history includes coronary artery disease, COPD, hypertension, hyperlipidemia, seizures, stroke, cerebral AVMs, intracranial hemorrhage.  The patient was last seen by Dr. [name] February 11, 2020.  This was then the setting of a recent hospitalization for atrial flutter in December 2021.  He had been cardioverted in December 2021 for his atrial flutter and was on a short course of anticoagulation after this cardioversion.  He had been cardioverted in the past in 2020 for atrial fibrillation.  His CHA2DS2-VASc is 2 for hypertension and coronary artery disease.  He is interested in pursuing the watchman implant. [name] tells me that he is symptomatic when he is in atrial fibrillation with palpitations.   Past Medical History:  Diagnosis Date  . Atypical mole   . CAD (coronary artery disease)    a. MI/DES to Cx 2009 with staged PCI of LAD with DES and distal RCA with BMS. b. NSTEMI 12/2015 s/p DES to D1, known occlusion of dRCA tx medically.  . Chronic lower back pain    Still with recurrent left sided LBP with paresthesias down left leg --primarily with activities: pain pill use is avg <90 tabs per mo  . COPD (chronic obstructive pulmonary disease) ([place])    NOTED on CT chest 11/2013 done for lung ca screening.  Hosp for acute exac 11/2014.  Spirometry not supportive of this dx as of pulm eval 2017, though.  Spireva  trial by Dr. Elsworth Soho 02/2015.  Marland Kitchen GERD (gastroesophageal reflux disease)   . Heart attack ([place]) 12/12/2015  . History of adenomatous polyp of colon 02/2009;02/2014   Recall 5 yrs ([facility])  . History of kidney stones   . HTN (hypertension)   . Hyperlipidemia   . Intracranial hemorrhage ([place]) 12/27/14   small acute hemorrhage in gliotic medial R frontal lobe  . Memory loss   . MI (myocardial infarction) ([place]) 2009   "just had arm pain; never any chest pain"  . Migraine syndrome 10/07/2011    a couple per month usually, but worsening 2017 so Dr. Krista Blue adjusted med  . 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 Seizures ([place])    last one was fall of 2016.  Complex partial sz d/o.  Marland Kitchen Short-term memory loss 10/07/2011   "post brain OR & from his seizures"--worsening 2017, Dr. Krista Blue in neurology doing w/u.  Marland Kitchen Stroke ([place]) 12/2014  . Tobacco abuse   . Urethral stricture since 1981   Recurrent dilation has been required [facility] urology)    Past Surgical History:  Procedure Laterality Date  . BACK SURGERY  11/25/2015  . CARDIAC CATHETERIZATION  2011   [place], records unavailable: pt reports "normal".  . CARDIAC CATHETERIZATION N/A 12/15/2015   Procedure: Left Heart Cath and Coronary Angiography;  Surgeon: Peter M Martinique, MD;  Location: [place] CV LAB;  Service: Cardiovascular;  Laterality: N/A;  . CARDIAC CATHETERIZATION N/A 12/15/2015   Procedure: Coronary Stent Intervention;  Surgeon: Peter M Martinique, MD;  Location: [place] CV LAB;  Service: Cardiovascular;  Laterality: N/A;  . CARDIOVERSION N/A 06/21/2018   Procedure: CARDIOVERSION;  Surgeon: [name], MD;  Location[facility] ENDOSCOPY;  Service: Cardiovascular;  Laterality: N/A;  . CARDIOVERSION N/A 01/14/2020   Procedure: CARDIOVERSION;  Surgeon: Buford Dresser, MD;  Location: [facility] ENDOSCOPY;  Service: Cardiovascular;  Laterality: N/A;  . COLONOSCOPY W/ POLYPECTOMY  02/2009;02/2014   Recall 5 yr (aden poly,  hyperplast polyp, int and ext hemorr).  Next recall is 02/2019.  Marland Kitchen CORONARY ANGIOPLASTY WITH STENT PLACEMENT  2009   3 DES to LCx; still with known distal RCA/PDA occlusion.  EF 60% by LV-gram.  . CRANIOTOMY FOR AVM  1981   Dx'd after pt began having seizures.  . CT ANGIOGRAM (CEREBRAL)  12/31/14   NORMAL--plan per neuro is to repeat this in 3 mo.  Pt states it was repeated Spring 2017 and was "fine"  . CYSTOSCOPY WITH URETHRAL DILATATION N/A 03/27/2019   Procedure: CYSTOSCOPY WITH URETHRAL DILATATION;  Surgeon: Irine Seal, MD;  Location: [facility];  Service: Urology;  Laterality: N/A;  . LUMBAR [place] SURGERY  2008; 2009   Dr. Joya Salm  . LUMBAR LAMINECTOMY/DECOMPRESSION MICRODISCECTOMY N/A 11/25/2015   Procedure: Lumbar three- four Lumbar four- five Laminectomy/Foraminotomy;  Surgeon: Leeroy Cha, MD;  Location: [place];  Service: Neurosurgery;  Laterality: N/A;  L3-4 L4-5 Laminectomy/Foraminotomy/possible Lumbar Drain  . TEE WITHOUT CARDIOVERSION N/A 06/21/2018   Procedure: TRANSESOPHAGEAL ECHOCARDIOGRAM (TEE);  Surgeon: Acie Fredrickson Wonda Cheng, MD;  Location: [place];  Service: Cardiovascular;  Laterality: N/A;  . TEE WITHOUT CARDIOVERSION N/A 01/14/2020   Procedure: TRANSESOPHAGEAL ECHOCARDIOGRAM (TEE);  Surgeon: Buford Dresser, MD;  Location[facility]OSCOPY;  Service: Cardiovascular;  Laterality: N/A;  . TONSILLECTOMY     "I was little"  . TRANSTHORACIC ECHOCARDIOGRAM  12/07/14   Normal LV size/fxn, EF 60%, normal valves.  Marland Kitchen URETEROSCOPY WITH HOLMIUM LASER LITHOTRIPSY Left 03/27/2019   Procedure: cysto with dilation of uretheral sticture, ureteroscopy and left retrograde pyelogram.;  Surgeon: Irine Seal, MD;  Location: [facility];  Service: Urology;  Laterality: Left;    Current Medications: Current Meds  Medication Sig  . acetaminophen (TYLENOL) 500 MG tablet Take 1 tablet (500 mg total) by mouth every 6 (six) hours as needed for headache. (Patient taking differently: Take 1,000 mg by mouth  every 6 (six) hours as needed for headache.)  . albuterol (PROVENTIL HFA;VENTOLIN HFA) 108 (90 BASE) MCG/ACT inhaler Inhale 2 puffs into the lungs every 6 (six) hours as needed for wheezing or shortness of breath.  Marland Kitchen aspirin EC 81 MG tablet Take 81 mg by mouth at bedtime.   . Cholecalciferol (VITAMIN D3) 50 MCG (2000 UT) TABS Take 2,000 Units by mouth daily.  Marland Kitchen docusate sodium (COLACE) 100 MG capsule Take 100 mg by mouth 2 (two) times daily.   . famotidine (PEPCID) 20 MG tablet Take 20 mg by mouth daily before breakfast.   . HYDROcodone-acetaminophen (NORCO) 10-325 MG per tablet Take 1 tablet by mouth every 8 (eight) hours as needed (for pain).   . metoprolol tartrate (LOPRESSOR) 25 MG tablet Take 25 mg by mouth 2 (two) times daily.  . nitroGLYCERIN (NITROSTAT) 0.4 MG SL tablet Place 1 tablet (0.4 mg total) under the tongue every 5 (five) minutes as needed for chest pain (CP or SOB). (Patient taking differently: Place 0.4 mg under the tongue every 5 (five) minutes as needed for chest pain (OR shortness of breath).)  . oxcarbazepine (  TRILEPTAL) 600 MG tablet TAKE ONE TABLET BY MOUTH TWICE DAILY (Patient taking differently: Take 600 mg by mouth 2 (two) times daily.)  . potassium chloride SA (KLOR-CON M20) 20 MEQ tablet Take 1 tablet (20 mEq total) by mouth daily.  . rosuvastatin (CRESTOR) 40 MG tablet Take 1 tablet (40 mg total) by mouth daily. (Patient taking differently: Take 40 mg by mouth every other day.)  . torsemide (DEMADEX) 20 MG tablet Take 1 tablet (20 mg total) by mouth daily.     Allergies:   Brilinta [ticagrelor], Tramadol, Atorvastatin, Ezetimibe, and Morphine and related   Social History   Socioeconomic History  . Marital status: Married    Spouse name: Kathie Rhodes  . Number of children: 2  . Years of education: 10 th  . Highest education level: Not on file  Occupational History  . Occupation: retired     Associate Professor: RETIRED    Comment: retired  Tobacco Use  . Smoking status:  Former Smoker    Packs/day: 3.00    Years: 27.00    Pack years: 81.00    Types: Cigarettes    Quit date: 02/08/2001    Years since quitting: 19.0  . Smokeless tobacco: Never Used  Vaping Use  . Vaping Use: Never used  Substance and Sexual Activity  . Alcohol use: Yes    Alcohol/week: 7.0 standard drinks    Types: 7 Cans of beer per week    Comment: 12 oz beer   . Drug use: No  . Sexual activity: Yes  Other Topics Concern  . Not on file  Social History Narrative   Patient lives at home with his wife Kathie Rhodes).   Two adult children.   Retired from [employer] approx age 67 due to medical reasons (disabled due to memory issues, seizure d/o and chronic LBP).   Education 10th grade.   Right handed.   Caffeine two cups of tea and two cups of coffee.   Tobacco 45 pack-yr hx, quit approx age 79.  Alcohol: 1-2 beers per night.     No hx of alc or drug problems.   No exercise.   Diet: regular   Social Determinants of Health   Financial Resource Strain: Not on file  Food Insecurity: Not on file  Transportation Needs: Not on file  Physical Activity: Not on file  Stress: Not on file  Social Connections: Not on file     Family History: The patient's family history includes Asthma in his father; Cancer in his father; Heart attack in his paternal uncle; Hyperlipidemia in his mother; Hypertension in his father. There is no history of Stroke.  ROS:   Please see the history of present illness.    All other systems reviewed and are negative.  EKGs/Labs/Other Studies Reviewed:    The following studies were reviewed today:  January 14, 2020 TEE personally reviewed Left ventricular function normal, 55% Right ventricular function normal No left atrial appendage thrombus Moderate plaque in the ascending aorta  December 15, 2015 left heart catheterization  The left ventricular systolic function is normal.  LV end diastolic pressure is mildly elevated.  The left ventricular ejection  fraction is 55-65% by visual estimate.  Prox RCA lesion, 30 %stenosed.  Mid RCA lesion, 60 %stenosed.  Mid RCA to Dist RCA lesion, 60 %stenosed.  Dist RCA lesion, 99 %stenosed.  Ost LAD lesion, 0 %stenosed.  Mid Cx lesion, 0 %stenosed.  Ost 2nd Mrg to 2nd Mrg lesion, 40 %stenosed.  A STENT SYNERGY  DES 2.25X12 drug eluting stent was successfully placed.  1st Diag lesion, 95 %stenosed.  Post intervention, there is a 0% residual stenosis.   1. 2 vessel obstructive CAD    - 95% first diagonal    - 99% distal RCA. This is just distal to prior stent and appears chronic with left to right collaterals. The vessel is very small in this area and involves with PLOM and PDA bifurcation 2. Continued patency of stents in the proximal LAD and mid LCx. 3. Normal LV function with mildly elevated LVEDP 4. Successful stenting of the first diagonal with DES.     January 11, 2020 EKG Atrial flutter     Jun 20, 2018 EKG Atrial fibrillation     Recent Labs: 01/12/2020: Hemoglobin 16.5; Platelets 185 01/13/2020: ALT 29 01/14/2020: BUN 9; Creatinine, Ser 0.81; Magnesium 1.9; Potassium 3.8; Sodium 135 02/11/2020: BNP 25.3; TSH 1.040  Recent Lipid Panel    Component Value Date/Time   CHOL 149 02/06/2016 0749   TRIG 99 02/06/2016 0749   HDL 43 02/06/2016 0749   CHOLHDL 3.5 02/06/2016 0749   VLDL 20 02/06/2016 0749   LDLCALC 86 02/06/2016 0749    Physical Exam:    VS:  BP 128/72   Pulse 60   Ht 5\' 11"  (1.803 m)   Wt 258 lb (117 kg)   SpO2 97%   BMI 35.98 kg/m     Wt Readings from Last 3 Encounters:  03/10/20 258 lb (117 kg)  02/11/20 260 lb (117.9 kg)  01/14/20 254 lb 6.4 oz (115.4 kg)     GEN:  Well nourished, well developed in no acute distress HEENT: Normal NECK: No JVD; No carotid bruits LYMPHATICS: No lymphadenopathy CARDIAC: RRR, no murmurs, rubs, gallops RESPIRATORY:  Clear to auscultation without rales, wheezing or rhonchi  ABDOMEN: Soft, non-tender,  non-distended MUSCULOSKELETAL:  No edema; No deformity  SKIN: Warm and dry NEUROLOGIC:  Alert and oriented x 3 PSYCHIATRIC:  Normal affect   ASSESSMENT:    1. Persistent atrial fibrillation ([place])   2. H/O intracranial hemorrhage   3. Arteriovenous malformation of cerebral vessels   4. Coronary artery disease involving native coronary artery of native heart without angina pectoris    PLAN:    In order of problems listed above:  1. Persistent atrial fibrillation and flutter Symptomatic.  CHA2DS2-VASc of at least 2 for hypertension and coronary/aortic disease.  Has a history of AVMs and intracranial hemorrhage which precludes use of chronic anticoagulation.  He has tolerated short courses of anticoagulation in the past surrounding the times of cardioversion.  Patient would like to pursue long-term prophylaxis against stroke without the use of long-term anticoagulants.  We discussed the watchman implant during today's appointment at length.  His wife was present for our discussion.  I think he is an excellent candidate for the watchman device.  As a part of the treatment plan for his atrial fibrillation and flutter which is required prior cardioversions, we discussed addressing his atrial arrhythmias first before pursuing watchman implant.  We discussed the atrial fibrillation ablation procedure in detail during today's visit including the risks, expected recovery time and expected efficacy.  They would like to proceed with a staged PVI/CTI followed 6 to 8 weeks later by the watchman implant.  He would require 3 weeks of anticoagulation prior to the ablation.  After the ablation he would continue 5 mg of Eliquis twice daily until the time of watchman implant.  At the time of watchman implant he  would decrease his dose of Eliquis to 2.5 mg twice daily in addition to an 81 mg aspirin daily.  I have seen Kathyrn Drown Loeb in the office today who is being considered for a Watchman left atrial appendage  closure device. I believe they will benefit from this procedure given their history of atrial fibrillation, CHA2DS2-VASc score of 2 and unadjusted ischemic stroke rate of 2.2% per year. Unfortunately, the patient is not felt to be a long term anticoagulation candidate secondary to a history of intracranial hemorrhage. The patient's chart has been reviewed and I feel that they would be a candidate for short term oral anticoagulation after Watchman implant.   It is my belief that after undergoing a LAA closure procedure, Mingo D Cage will not need long term anticoagulation which eliminates anticoagulation side effects and major bleeding risk.   Procedural risks for the Watchman implant have been reviewed with the patient including a 0.5% risk of stroke, <1% risk of perforation and <1% risk of device embolization.    The published clinical data on the safety and effectiveness of WATCHMAN include but are not limited to the following: - Holmes DR, Mechele Claude, Sick P et al. for the PROTECT AF Investigators. Percutaneous closure of the left atrial appendage versus warfarin therapy for prevention of stroke in patients with atrial fibrillation: a randomised non-inferiority trial. Lancet 2009; 374: 534-42. Mechele Claude, Doshi SK, Abelardo Diesel D et al. on behalf of the PROTECT AF Investigators. Percutaneous Left Atrial Appendage Closure for Stroke Prophylaxis in Patients With Atrial Fibrillation 2.3-Year Follow-up of the PROTECT AF (Watchman Left Atrial Appendage System for Embolic Protection in Patients With Atrial Fibrillation) Trial. Circulation 2013; 127:720-729. - Alli O, Doshi S,  Kar S, Reddy VY, Sievert H et al. Quality of Life Assessment in the Randomized PROTECT AF (Percutaneous Closure of the Left Atrial Appendage Versus Warfarin Therapy for Prevention of Stroke in Patients With Atrial Fibrillation) Trial of Patients at Risk for Stroke With Nonvalvular Atrial Fibrillation. J Am Coll Cardiol 2013;  [id number]. Vertell Limber DR, Tarri Abernethy, Price M, McRae[place], Sievert H, Doshi S, Huber K, Reddy V. Prospective randomized evaluation of the Watchman left atrial appendage Device in patients with atrial fibrillation versus long-term warfarin therapy; the PREVAIL trial. Journal of the [employer] of Cardiology, Vol. 4, No. 1, 2014, 1-11. - Kar S, Doshi SK, Sadhu A, Horton R, Osorio J et al. Primary outcome evaluation of a next-generation left atrial appendage closure device: results from the PINNACLE FLX trial. Circulation 2021;143(18)1754-1762.    After today's visit with the patient which was dedicated solely for shared decision making visit regarding LAA closure device, the patient decided to proceed with the LAA appendage closure procedure scheduled to be done in the near future at [facility]. Prior to the procedure, I would like to obtain a gated CT scan of the chest with contrast timed for PV/LA visualization.    2.  History of intracranial hemorrhage, AVMs Has tolerated short courses of anticoagulation in the past.  Given his history of intracranial hemorrhage, I believe he would be an excellent candidate for a watchman procedure with the goal of avoiding long-term anticoagulation.  3.  Coronary artery disease No ischemic symptoms during today's visit.  Medication Adjustments/Labs and Tests Ordered: Current medicines are reviewed at length with the patient today.  Concerns regarding medicines are outlined above.  Orders Placed This Encounter  Procedures  . EKG 12-Lead   No orders of the defined  types were placed in this encounter.    Signed, Lars Mage, MD, [facility]  03/10/2020 1:12 PM    Electrophysiology [facility]

## 2020-03-11 ENCOUNTER — Telehealth: Payer: Self-pay

## 2020-03-11 DIAGNOSIS — I4891 Unspecified atrial fibrillation: Secondary | ICD-10-CM

## 2020-03-11 NOTE — Telephone Encounter (Signed)
-----   Message from Thora Lance, RN sent at 03/10/2020 12:35 PM EST ----- Regarding: PVI - Flutter ablation and Watchman Per Dr Quentin Ore pt wll need to be scheduled for PVI-Flutter Ablaltion and Watchman. Will need CT-PV protocol for both procedures.  Pt will need to start Eliquis 5mg  bid prior to ablation.  Thank You,  Dan Europe

## 2020-03-11 NOTE — Telephone Encounter (Signed)
Outreach made to Pt.  Pt scheduled for afib ablation 04/21/20  Labs/covid test scheduled.  Ordered placed for cardiac CT prior to ablation/also to evaluate for watchman.  Will have f/u with Dr. Quentin Ore 4 weeks s/p ablation to give instruction for Watchman.  Will schedule Watchman.  Work up complete.

## 2020-03-24 MED ORDER — METOPROLOL TARTRATE 25 MG PO TABS: 25.0000 mg | ORAL_TABLET | Freq: Two times a day (BID) | ORAL | 1 refills | Status: DC

## 2020-03-31 ENCOUNTER — Other Ambulatory Visit: Payer: Self-pay

## 2020-03-31 ENCOUNTER — Other Ambulatory Visit: Payer: Medicare HMO | Admitting: *Deleted

## 2020-03-31 DIAGNOSIS — I4891 Unspecified atrial fibrillation: Secondary | ICD-10-CM | POA: Diagnosis not present

## 2020-03-31 LAB — CBC WITH DIFFERENTIAL/PLATELET
Basophils Absolute: 0.1 10*3/uL (ref 0.0–0.2)
Basos: 1 %
EOS (ABSOLUTE): 0.2 10*3/uL (ref 0.0–0.4)
Eos: 3 %
Hematocrit: 47.7 % (ref 37.5–51.0)
Hemoglobin: 16 g/dL (ref 13.0–17.7)
Immature Grans (Abs): 0 10*3/uL (ref 0.0–0.1)
Immature Granulocytes: 0 %
Lymphocytes Absolute: 1.4 10*3/uL (ref 0.7–3.1)
Lymphs: 18 %
MCH: 29.9 pg (ref 26.6–33.0)
MCHC: 33.5 g/dL (ref 31.5–35.7)
MCV: 89 fL (ref 79–97)
Monocytes Absolute: 0.7 10*3/uL (ref 0.1–0.9)
Monocytes: 9 %
Neutrophils Absolute: 5.7 10*3/uL (ref 1.4–7.0)
Neutrophils: 69 %
Platelets: 195 10*3/uL (ref 150–450)
RBC: 5.35 x10E6/uL (ref 4.14–5.80)
RDW: 13.4 % (ref 11.6–15.4)
WBC: 8.1 10*3/uL (ref 3.4–10.8)

## 2020-03-31 LAB — BASIC METABOLIC PANEL
BUN/Creatinine Ratio: 15 (ref 10–24)
BUN: 15 mg/dL (ref 8–27)
CO2: 22 mmol/L (ref 20–29)
Calcium: 9.3 mg/dL (ref 8.6–10.2)
Chloride: 102 mmol/L (ref 96–106)
Creatinine, Ser: 1.02 mg/dL (ref 0.76–1.27)
GFR calc Af Amer: 92 mL/min/{1.73_m2} (ref >59)
GFR calc non Af Amer: 80 mL/min/{1.73_m2} (ref >59)
Glucose: 136 mg/dL — ABNORMAL HIGH (ref 65–99)
Potassium: 4.2 mmol/L (ref 3.5–5.2)
Sodium: 140 mmol/L (ref 134–144)

## 2020-03-31 MED ORDER — APIXABAN 5 MG PO TABS: 5.0000 mg | ORAL_TABLET | Freq: Two times a day (BID) | ORAL | 11 refills | Status: DC

## 2020-04-10 ENCOUNTER — Encounter (HOSPITAL_COMMUNITY): Payer: Self-pay

## 2020-04-10 ENCOUNTER — Telehealth (HOSPITAL_COMMUNITY): Payer: Self-pay | Admitting: Emergency Medicine

## 2020-04-10 NOTE — Telephone Encounter (Signed)
Reaching out to patient to offer assistance regarding upcoming cardiac imaging study; pt verbalizes understanding of appt date/time, parking situation and where to check in, pre-test NPO status and medications ordered, and verified current allergies; name and call back number provided for further questions should they arise Marchia Bond RN Navigator Cardiac [facility] [phone] office [phone] cell   mychart message sent

## 2020-04-14 ENCOUNTER — Other Ambulatory Visit: Payer: Self-pay

## 2020-04-14 ENCOUNTER — Ambulatory Visit (HOSPITAL_COMMUNITY)
Admission: RE | Admit: 2020-04-14 | Discharge: 2020-04-14 | Disposition: A | Discharge status: Home / Self Care | Payer: Medicare HMO | Source: Ambulatory Visit | Attending: Cardiology | Admitting: Cardiology

## 2020-04-14 DIAGNOSIS — M7732 Calcaneal spur, left foot: Secondary | ICD-10-CM | POA: Diagnosis not present

## 2020-04-14 DIAGNOSIS — I4891 Unspecified atrial fibrillation: Secondary | ICD-10-CM

## 2020-04-14 DIAGNOSIS — M71572 Other bursitis, not elsewhere classified, left ankle and foot: Secondary | ICD-10-CM | POA: Diagnosis not present

## 2020-04-14 DIAGNOSIS — M722 Plantar fascial fibromatosis: Secondary | ICD-10-CM | POA: Diagnosis not present

## 2020-04-14 MED ORDER — IOHEXOL 350 MG/ML SOLN
80.0000 mL | Freq: Once | INTRAVENOUS | Status: AC | PRN
  Administered 2020-04-14: 80 mL via INTRAVENOUS

## 2020-04-15 ENCOUNTER — Ambulatory Visit: Payer: Self-pay | Admitting: Podiatry

## 2020-04-17 ENCOUNTER — Other Ambulatory Visit (HOSPITAL_COMMUNITY)
Admission: RE | Admit: 2020-04-17 | Discharge: 2020-04-17 | Disposition: A | Discharge status: Home / Self Care | Payer: Medicare HMO | Source: Ambulatory Visit | Attending: Cardiology | Admitting: Cardiology

## 2020-04-17 DIAGNOSIS — Z20822 Contact with and (suspected) exposure to covid-19: Secondary | ICD-10-CM | POA: Insufficient documentation

## 2020-04-17 DIAGNOSIS — Z01812 Encounter for preprocedural laboratory examination: Secondary | ICD-10-CM | POA: Diagnosis not present

## 2020-04-17 LAB — SARS CORONAVIRUS 2 (TAT 6-24 HRS): SARS Coronavirus 2: NEGATIVE

## 2020-04-18 ENCOUNTER — Other Ambulatory Visit (HOSPITAL_COMMUNITY): Payer: Medicare HMO

## 2020-04-21 ENCOUNTER — Other Ambulatory Visit: Payer: Self-pay

## 2020-04-21 ENCOUNTER — Ambulatory Visit (HOSPITAL_COMMUNITY): Payer: Medicare HMO | Admitting: Certified Registered Nurse Anesthetist

## 2020-04-21 ENCOUNTER — Encounter (HOSPITAL_COMMUNITY)
Admission: RE | Disposition: A | Discharge status: Home / Self Care | Payer: Self-pay | Source: Home / Self Care | Attending: Cardiology

## 2020-04-21 ENCOUNTER — Ambulatory Visit (HOSPITAL_COMMUNITY)
Admission: RE | Admit: 2020-04-21 | Discharge: 2020-04-21 | Disposition: A | Discharge status: Home / Self Care | Payer: Medicare HMO | Attending: Cardiology | Admitting: Cardiology

## 2020-04-21 ENCOUNTER — Encounter (HOSPITAL_COMMUNITY): Payer: Self-pay | Admitting: Cardiology

## 2020-04-21 DIAGNOSIS — Z7982 Long term (current) use of aspirin: Secondary | ICD-10-CM | POA: Insufficient documentation

## 2020-04-21 DIAGNOSIS — I2510 Atherosclerotic heart disease of native coronary artery without angina pectoris: Secondary | ICD-10-CM | POA: Insufficient documentation

## 2020-04-21 DIAGNOSIS — Z87891 Personal history of nicotine dependence: Secondary | ICD-10-CM | POA: Diagnosis not present

## 2020-04-21 DIAGNOSIS — I480 Paroxysmal atrial fibrillation: Secondary | ICD-10-CM | POA: Diagnosis not present

## 2020-04-21 DIAGNOSIS — Q282 Arteriovenous malformation of cerebral vessels: Secondary | ICD-10-CM | POA: Diagnosis not present

## 2020-04-21 DIAGNOSIS — Z885 Allergy status to narcotic agent status: Secondary | ICD-10-CM | POA: Insufficient documentation

## 2020-04-21 DIAGNOSIS — Z8673 Personal history of transient ischemic attack (TIA), and cerebral infarction without residual deficits: Secondary | ICD-10-CM | POA: Diagnosis not present

## 2020-04-21 DIAGNOSIS — I4819 Other persistent atrial fibrillation: Secondary | ICD-10-CM | POA: Diagnosis not present

## 2020-04-21 DIAGNOSIS — I483 Typical atrial flutter: Secondary | ICD-10-CM | POA: Diagnosis not present

## 2020-04-21 DIAGNOSIS — J449 Chronic obstructive pulmonary disease, unspecified: Secondary | ICD-10-CM | POA: Diagnosis not present

## 2020-04-21 DIAGNOSIS — Z888 Allergy status to other drugs, medicaments and biological substances status: Secondary | ICD-10-CM | POA: Diagnosis not present

## 2020-04-21 DIAGNOSIS — I4891 Unspecified atrial fibrillation: Secondary | ICD-10-CM | POA: Diagnosis not present

## 2020-04-21 DIAGNOSIS — I4892 Unspecified atrial flutter: Secondary | ICD-10-CM | POA: Diagnosis not present

## 2020-04-21 DIAGNOSIS — Z79899 Other long term (current) drug therapy: Secondary | ICD-10-CM | POA: Insufficient documentation

## 2020-04-21 DIAGNOSIS — I10 Essential (primary) hypertension: Secondary | ICD-10-CM | POA: Diagnosis not present

## 2020-04-21 HISTORY — PX: ATRIAL FIBRILLATION ABLATION: EP1191

## 2020-04-21 LAB — POCT ACTIVATED CLOTTING TIME
Activated Clotting Time: 333 seconds
Activated Clotting Time: 339 seconds

## 2020-04-21 SURGERY — ATRIAL FIBRILLATION ABLATION
Anesthesia: General

## 2020-04-21 MED ORDER — ISOPROTERENOL HCL 0.2 MG/ML IJ SOLN
INTRAMUSCULAR | Status: AC
  Filled 2020-04-21: qty 5

## 2020-04-21 MED ORDER — ONDANSETRON HCL 4 MG/2ML IJ SOLN
INTRAMUSCULAR | Status: DC | PRN
  Administered 2020-04-21: 4 mg via INTRAVENOUS

## 2020-04-21 MED ORDER — HEPARIN SODIUM (PORCINE) 1000 UNIT/ML IJ SOLN
INTRAMUSCULAR | Status: DC | PRN
  Administered 2020-04-21: 2000 [IU] via INTRAVENOUS
  Administered 2020-04-21: 16000 [IU] via INTRAVENOUS
  Administered 2020-04-21: 3000 [IU] via INTRAVENOUS

## 2020-04-21 MED ORDER — SODIUM CHLORIDE 0.9 % IV SOLN: INTRAVENOUS | Status: DC

## 2020-04-21 MED ORDER — ONDANSETRON HCL 4 MG/2ML IJ SOLN: 4.0000 mg | Freq: Four times a day (QID) | INTRAMUSCULAR | Status: DC | PRN

## 2020-04-21 MED ORDER — ROCURONIUM BROMIDE 10 MG/ML (PF) SYRINGE
PREFILLED_SYRINGE | INTRAVENOUS | Status: DC | PRN
  Administered 2020-04-21: 60 mg via INTRAVENOUS
  Administered 2020-04-21 (×2): 20 mg via INTRAVENOUS
  Administered 2020-04-21: 10 mg via INTRAVENOUS

## 2020-04-21 MED ORDER — HEPARIN (PORCINE) IN NACL 1000-0.9 UT/500ML-% IV SOLN
INTRAVENOUS | Status: AC
  Filled 2020-04-21: qty 2000

## 2020-04-21 MED ORDER — HEPARIN SODIUM (PORCINE) 1000 UNIT/ML IJ SOLN
INTRAMUSCULAR | Status: AC
  Filled 2020-04-21: qty 1

## 2020-04-21 MED ORDER — PROPOFOL 10 MG/ML IV BOLUS
INTRAVENOUS | Status: DC | PRN
  Administered 2020-04-21: 170 mg via INTRAVENOUS

## 2020-04-21 MED ORDER — PANTOPRAZOLE SODIUM 40 MG PO TBEC
40.0000 mg | DELAYED_RELEASE_TABLET | Freq: Every day | ORAL | Status: DC
  Administered 2020-04-21: 40 mg via ORAL
  Filled 2020-04-21: qty 1

## 2020-04-21 MED ORDER — ACETAMINOPHEN 325 MG PO TABS: 650.0000 mg | ORAL_TABLET | ORAL | Status: DC | PRN

## 2020-04-21 MED ORDER — DEXAMETHASONE SODIUM PHOSPHATE 4 MG/ML IJ SOLN
INTRAMUSCULAR | Status: DC | PRN
  Administered 2020-04-21: 4 mg via INTRAVENOUS

## 2020-04-21 MED ORDER — ISOPROTERENOL HCL 0.2 MG/ML IJ SOLN
INTRAVENOUS | Status: DC | PRN
  Administered 2020-04-21: 4 ug/min via INTRAVENOUS

## 2020-04-21 MED ORDER — SUGAMMADEX SODIUM 200 MG/2ML IV SOLN
INTRAVENOUS | Status: DC | PRN
  Administered 2020-04-21: 200 mg via INTRAVENOUS

## 2020-04-21 MED ORDER — PROMETHAZINE HCL 25 MG/ML IJ SOLN: 6.2500 mg | INTRAMUSCULAR | Status: DC | PRN

## 2020-04-21 MED ORDER — HEPARIN (PORCINE) IN NACL 1000-0.9 UT/500ML-% IV SOLN
INTRAVENOUS | Status: DC | PRN
  Administered 2020-04-21 (×4): 500 mL

## 2020-04-21 MED ORDER — PANTOPRAZOLE SODIUM 40 MG PO TBEC: 40.0000 mg | DELAYED_RELEASE_TABLET | Freq: Every day | ORAL | 0 refills | Status: DC

## 2020-04-21 MED ORDER — PHENYLEPHRINE HCL-NACL 10-0.9 MG/250ML-% IV SOLN
INTRAVENOUS | Status: DC | PRN
  Administered 2020-04-21: 25 ug/min via INTRAVENOUS

## 2020-04-21 MED ORDER — LIDOCAINE 2% (20 MG/ML) 5 ML SYRINGE
INTRAMUSCULAR | Status: DC | PRN
  Administered 2020-04-21: 100 mg via INTRAVENOUS

## 2020-04-21 MED ORDER — SODIUM CHLORIDE 0.9% FLUSH: 3.0000 mL | INTRAVENOUS | Status: DC | PRN

## 2020-04-21 MED ORDER — MIDAZOLAM HCL 5 MG/5ML IJ SOLN
INTRAMUSCULAR | Status: DC | PRN
  Administered 2020-04-21: 2 mg via INTRAVENOUS

## 2020-04-21 MED ORDER — SODIUM CHLORIDE 0.9 % IV SOLN: 250.0000 mL | INTRAVENOUS | Status: DC | PRN

## 2020-04-21 MED ORDER — APIXABAN 5 MG PO TABS
5.0000 mg | ORAL_TABLET | Freq: Two times a day (BID) | ORAL | Status: DC
  Administered 2020-04-21: 5 mg via ORAL
  Filled 2020-04-21: qty 1

## 2020-04-21 MED ORDER — FENTANYL CITRATE (PF) 100 MCG/2ML IJ SOLN
INTRAMUSCULAR | Status: DC | PRN
  Administered 2020-04-21: 100 ug via INTRAVENOUS

## 2020-04-21 MED ORDER — FENTANYL CITRATE (PF) 100 MCG/2ML IJ SOLN
25.0000 ug | INTRAMUSCULAR | Status: DC | PRN
  Administered 2020-04-21 (×2): 50 ug via INTRAVENOUS

## 2020-04-21 MED ORDER — HYDROCODONE-ACETAMINOPHEN 5-325 MG PO TABS
ORAL_TABLET | ORAL | Status: AC
  Filled 2020-04-21: qty 1

## 2020-04-21 MED ORDER — SODIUM CHLORIDE 0.9% FLUSH: 3.0000 mL | Freq: Two times a day (BID) | INTRAVENOUS | Status: DC

## 2020-04-21 MED ORDER — HYDROCODONE-ACETAMINOPHEN 5-325 MG PO TABS
1.0000 | ORAL_TABLET | Freq: Once | ORAL | Status: AC
  Administered 2020-04-21: 1 via ORAL

## 2020-04-21 MED ORDER — HEPARIN SODIUM (PORCINE) 1000 UNIT/ML IJ SOLN
INTRAMUSCULAR | Status: DC | PRN
  Administered 2020-04-21: 1000 [IU] via INTRAVENOUS

## 2020-04-21 MED ORDER — FENTANYL CITRATE (PF) 100 MCG/2ML IJ SOLN
INTRAMUSCULAR | Status: AC
  Filled 2020-04-21: qty 2

## 2020-04-21 MED ORDER — PROTAMINE SULFATE 10 MG/ML IV SOLN
INTRAVENOUS | Status: DC | PRN
  Administered 2020-04-21 (×3): 10 mg via INTRAVENOUS

## 2020-04-21 SURGICAL SUPPLY — 19 items
BLANKET WARM UNDERBOD FULL ACC (MISCELLANEOUS) ×2 IMPLANT
CATH 8FR REPROCESSED SOUNDSTAR (CATHETERS) ×2 IMPLANT
CATH MAPPNG PENTARAY F 2-6-2MM (CATHETERS) ×1 IMPLANT
CATH S CIRCA THERM PROBE 10F (CATHETERS) ×2 IMPLANT
CATH SMTCH THERMOCOOL SF DF (CATHETERS) ×2 IMPLANT
CATH WEBSTER BI DIR CS D-F CRV (CATHETERS) ×2 IMPLANT
CLOSURE PERCLOSE PROSTYLE (VASCULAR PRODUCTS) ×6 IMPLANT
COVER SWIFTLINK CONNECTOR (BAG) ×2 IMPLANT
PACK EP LATEX FREE (CUSTOM PROCEDURE TRAY) ×2
PACK EP LF (CUSTOM PROCEDURE TRAY) ×1 IMPLANT
PAD PRO RADIOLUCENT 2001M-C (PAD) ×2 IMPLANT
PATCH CARTO3 (PAD) ×2 IMPLANT
PENTARAY F 2-6-2MM (CATHETERS) ×2
SHEATH BAYLIS TRANSSEPTAL 98CM (NEEDLE) ×2 IMPLANT
SHEATH CARTO VIZIGO SM CVD (SHEATH) ×2 IMPLANT
SHEATH PINNACLE 8F 10CM (SHEATH) ×4 IMPLANT
SHEATH PINNACLE 9F 10CM (SHEATH) ×2 IMPLANT
SHEATH PROBE COVER 6X72 (BAG) ×2 IMPLANT
TUBING SMART ABLATE COOLFLOW (TUBING) ×2 IMPLANT

## 2020-04-21 NOTE — Anesthesia Preprocedure Evaluation (Addendum)
Anesthesia Evaluation  Patient identified by MRN, date of birth, ID band Patient awake    Reviewed: Allergy & Precautions, NPO status , Patient's Chart, lab work & pertinent test results  Airway Mallampati: III  TM Distance: >3 FB Neck ROM: Full  Mouth opening: Limited Mouth Opening  Dental  (+) Chipped, Dental Advisory Given,    Pulmonary COPD, former smoker,    Pulmonary exam normal        Cardiovascular hypertension, Pt. on home beta blockers and Pt. on medications + CAD, + Past MI (2009) and + Cardiac Stents (09, 17)  + dysrhythmias Atrial Fibrillation  Rhythm:Irregular Rate:Normal     Neuro/Psych  Headaches, Seizures - (11/16), Well Controlled,  CVA    GI/Hepatic Neg liver ROS, GERD  ,  Endo/Other  negative endocrine ROS  Renal/GU negative Renal ROS  negative genitourinary   Musculoskeletal negative musculoskeletal ROS (+)   Abdominal (+)  Abdomen: soft. Bowel sounds: normal.  Peds  Hematology negative hematology ROS (+)   Anesthesia Other Findings   Reproductive/Obstetrics                          Anesthesia Physical Anesthesia Plan  ASA: III  Anesthesia Plan: General   Post-op Pain Management:    Induction: Intravenous  PONV Risk Score and Plan: 2 and Ondansetron, Dexamethasone, Midazolam and Treatment may vary due to age or medical condition  Airway Management Planned: Mask and Oral ETT  Additional Equipment: None  Intra-op Plan:   Post-operative Plan: Extubation in OR  Informed Consent: I have reviewed the patients History and Physical, chart, labs and discussed the procedure including the risks, benefits and alternatives for the proposed anesthesia with the patient or authorized representative who has indicated his/her understanding and acceptance.     Dental advisory given  Plan Discussed with: CRNA  Anesthesia Plan Comments: (ECHO 12/21: Left ventricular  ejection fraction, by estimation, is 55 to 60%. The left ventricle has normal function. 2. Right ventricular systolic function is normal. The right ventricular size is normal. 3. No left atrial/left atrial appendage thrombus was detected. 4. The mitral valve is normal in structure. No evidence of mitral valve regurgitation. No evidence of mitral stenosis. 5. The aortic valve is tricuspid. Aortic valve regurgitation is trivial. No aortic stenosis is present. 6. There is Moderate (Grade III) plaque involving the descending aorta. 7. Evidence of atrial level shunting detected by color flow Doppler. There is a small patent foramen ovale with predominantly left to right shunting across the atrial septum.  Lab Results      Component                Value               Date                      WBC                      8.1                 03/31/2020                HGB                      16.0                03/31/2020  HCT                      47.7                03/31/2020                MCV                      89                  03/31/2020                PLT                      195                 03/31/2020            Lab Results      Component                Value               Date                      NA                       140                 03/31/2020                K                        4.2                 03/31/2020                CO2                      22                  03/31/2020                GLUCOSE                  136 (H)             03/31/2020                BUN                      15                  03/31/2020                CREATININE               1.02                03/31/2020                CALCIUM                  9.3                 03/31/2020                GFRNONAA  80                  03/31/2020                GFRAA                    92                  03/31/2020          )       Anesthesia Quick Evaluation

## 2020-04-21 NOTE — H&P (Signed)
Electrophysiology Office Note:    Date:  03/10/2020   ID:  Peter Barnett, DOB February 14, 1960, MRN [id number]  PCP:  [name], MD          [facility] Cardiologist:  Ellis Parents, MD  [facility] Electrophysiologist:  [name], MD   Referring MD: [name], MD   Chief Complaint: Paroxysmal atrial fibrillation, history of intracranial hemorrhage  History of Present Illness:    Peter Barnett is a 60 y.o. male who presents for an evaluation of paroxysmal atrial fibrillation and history of intracranial hemorrhage at the request of Dr. [name]. Their medical history includes coronary artery disease, COPD, hypertension, hyperlipidemia, seizures, stroke, cerebral AVMs, intracranial hemorrhage.  The patient was last seen by Dr. [name] February 11, 2020.  This was then the setting of a recent hospitalization for atrial flutter in December 2021.  He had been cardioverted in December 2021 for his atrial flutter and was on a short course of anticoagulation after this cardioversion.  He had been cardioverted in the past in 2020 for atrial fibrillation.  His CHA2DS2-VASc is 2 for hypertension and coronary artery disease.  He is interested in pursuing the watchman implant. Peter Barnett tells me that he is symptomatic when he is in atrial fibrillation with palpitations.       Past Medical History:  Diagnosis Date   Atypical mole    CAD (coronary artery disease)    a. MI/DES to Cx 2009 with staged PCI of LAD with DES and distal RCA with BMS. b. NSTEMI 12/2015 s/p DES to D1, known occlusion of dRCA tx medically.   Chronic lower back pain    Still with recurrent left sided LBP with paresthesias down left leg --primarily with activities: pain pill use is avg <90 tabs per mo   COPD (chronic obstructive pulmonary disease) ([place])    NOTED on CT chest 11/2013 done for lung ca screening.  Hosp for acute exac 11/2014.  Spirometry not supportive of this dx as of pulm  eval 2017, though.  Spireva trial by Dr. Elsworth Soho 02/2015.   GERD (gastroesophageal reflux disease)    Heart attack ([place]) 12/12/2015   History of adenomatous polyp of colon 02/2009;02/2014   Recall 5 yrs ([facility])   History of kidney stones    HTN (hypertension)    Hyperlipidemia    Intracranial hemorrhage ([place]) 12/27/14   small acute hemorrhage in gliotic medial R frontal lobe   Memory loss    MI (myocardial infarction) ([place]) 2009   "just had arm pain; never any chest pain"   Migraine syndrome 10/07/2011    a couple per month usually, but worsening 2017 so Dr. 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  Complex partial sz d/o.   Short-term memory loss 10/07/2011   "post brain OR & from his seizures"--worsening 2017, Dr. Krista Blue in neurology doing w/u.   Stroke [facility]) 12/2014   Tobacco abuse    Urethral stricture since 1981   Recurrent dilation has been required [facility] urology)         Past Surgical History:  Procedure Laterality Date   BACK SURGERY  11/25/2015   CARDIAC CATHETERIZATION  2011   [place], records unavailable: pt reports "normal".   CARDIAC CATHETERIZATION N/A 12/15/2015   Procedure: Left Heart Cath and Coronary Angiography;  Surgeon: Peter M Martinique, MD;  Location: [place] CV LAB;  Service: Cardiovascular;  Laterality: N/A;   CARDIAC CATHETERIZATION N/A 12/15/2015   Procedure: Coronary Stent Intervention;  Surgeon: Peter M Martinique, MD;  Location: [place] CV LAB;  Service: Cardiovascular;  Laterality: N/A;   CARDIOVERSION N/A 06/21/2018   Procedure: CARDIOVERSION;  Surgeon: Acie Fredrickson Wonda Cheng, MD;  Location: [facility] ENDOSCOPY;  Service: Cardiovascular;  Laterality: N/A;   CARDIOVERSION N/A 01/14/2020   Procedure: CARDIOVERSION;  Surgeon: Buford Dresser, MD;  Location: [facility] ENDOSCOPY;  Service: Cardiovascular;  Laterality: N/A;   COLONOSCOPY W/  POLYPECTOMY  02/2009;02/2014   Recall 5 yr (aden poly, hyperplast polyp, int and ext hemorr).  Next recall is 02/2019.   CORONARY ANGIOPLASTY WITH STENT PLACEMENT  2009   3 DES to LCx; still with known distal RCA/PDA occlusion.  EF 60% by LV-gram.   CRANIOTOMY FOR AVM  1981   Dx'd after pt began having seizures.   CT ANGIOGRAM (CEREBRAL)  12/31/14   NORMAL--plan per neuro is to repeat this in 3 mo.  Pt states it was repeated Spring 2017 and was "fine"   [place] N/A 03/27/2019   Procedure: CYSTOSCOPY WITH URETHRAL DILATATION;  Surgeon: Irine Seal, MD;  Location: [facility];  Service: Urology;  Laterality: N/A;   LUMBAR DISC SURGERY  2008; 2009   Dr. Joya Salm   LUMBAR LAMINECTOMY/DECOMPRESSION MICRODISCECTOMY N/A 11/25/2015   Procedure: Lumbar three- four Lumbar four- five Laminectomy/Foraminotomy;  Surgeon: Leeroy Cha, MD;  Location: [place];  Service: Neurosurgery;  Laterality: N/A;  L3-4 L4-5 Laminectomy/Foraminotomy/possible Lumbar Drain   TEE WITHOUT CARDIOVERSION N/A 06/21/2018   Procedure: TRANSESOPHAGEAL ECHOCARDIOGRAM (TEE);  Surgeon: Acie Fredrickson Wonda Cheng, MD;  Location: [place];  Service: Cardiovascular;  Laterality: N/A;   TEE WITHOUT CARDIOVERSION N/A 01/14/2020   Procedure: TRANSESOPHAGEAL ECHOCARDIOGRAM (TEE);  Surgeon: Buford Dresser, MD;  Location: [facility] ENDOSCOPY;  Service: Cardiovascular;  Laterality: N/A;   TONSILLECTOMY     "I was little"   TRANSTHORACIC ECHOCARDIOGRAM  12/07/14   Normal LV size/fxn, EF 60%, normal valves.   URETEROSCOPY WITH HOLMIUM LASER LITHOTRIPSY Left 03/27/2019   Procedure: cysto with dilation of uretheral sticture, ureteroscopy and left retrograde pyelogram.;  Surgeon: Irine Seal, MD;  Location: [facility];  Service: Urology;  Laterality: Left;    Current Medications: Active Medications      Current Meds  Medication Sig   acetaminophen (TYLENOL) 500 MG tablet Take 1 tablet (500 mg total) by  mouth every 6 (six) hours as needed for headache. (Patient taking differently: Take 1,000 mg by mouth every 6 (six) hours as needed for headache.)   albuterol (PROVENTIL HFA;VENTOLIN HFA) 108 (90 BASE) MCG/ACT inhaler Inhale 2 puffs into the lungs every 6 (six) hours as needed for wheezing or shortness of breath.   aspirin EC 81 MG tablet Take 81 mg by mouth at bedtime.    Cholecalciferol (VITAMIN D3) 50 MCG (2000 UT) TABS Take 2,000 Units by mouth daily.   docusate sodium (COLACE) 100 MG capsule Take 100 mg by mouth 2 (two) times daily.    famotidine (PEPCID) 20 MG tablet Take 20 mg by mouth daily before breakfast.    HYDROcodone-acetaminophen (NORCO) 10-325 MG per tablet Take 1 tablet by mouth every 8 (eight) hours as needed (for pain).    metoprolol tartrate (LOPRESSOR) 25 MG tablet Take 25 mg by mouth 2 (two) times daily.   nitroGLYCERIN (NITROSTAT) 0.4 MG SL tablet Place 1 tablet (0.4 mg total) under the tongue every 5 (five) minutes as needed for chest pain (CP or SOB). (  Patient taking differently: Place 0.4 mg under the tongue every 5 (five) minutes as needed for chest pain (OR shortness of breath).)   oxcarbazepine (TRILEPTAL) 600 MG tablet TAKE ONE TABLET BY MOUTH TWICE DAILY (Patient taking differently: Take 600 mg by mouth 2 (two) times daily.)   potassium chloride SA (KLOR-CON M20) 20 MEQ tablet Take 1 tablet (20 mEq total) by mouth daily.   rosuvastatin (CRESTOR) 40 MG tablet Take 1 tablet (40 mg total) by mouth daily. (Patient taking differently: Take 40 mg by mouth every other day.)   torsemide (DEMADEX) 20 MG tablet Take 1 tablet (20 mg total) by mouth daily.       Allergies:   Brilinta [ticagrelor], Tramadol, Atorvastatin, Ezetimibe, and Morphine and related   Social History        Socioeconomic History   Marital status: Married    Spouse name: [name]   Number of children: 2   Years of education: 10 th   Highest education level: Not on file   Occupational History   Occupation: retired     Fish farm manager: RETIRED    Comment: retired  Tobacco Use   Smoking status: Former Smoker    Packs/day: 3.00    Years: 27.00    Pack years: 81.00    Types: Cigarettes    Quit date: 02/08/2001    Years since quitting: 19.0   Smokeless tobacco: Never Used  Vaping Use   Vaping Use: Never used  Substance and Sexual Activity   Alcohol use: Yes    Alcohol/week: 7.0 standard drinks    Types: 7 Cans of beer per week    Comment: 12 oz beer    Drug use: No   Sexual activity: Yes  Other Topics Concern   Not on file  Social History Narrative   Patient lives at home with his wife [name]).   Two adult children.   Retired from [employer] approx age 62 due to medical reasons (disabled due to memory issues, seizure d/o and chronic LBP).   Education 10th grade.   Right handed.   Caffeine two cups of tea and two cups of coffee.   Tobacco 45 pack-yr hx, quit approx age 30.  Alcohol: 1-2 beers per night.     No hx of alc or drug problems.   No exercise.   Diet: regular   Social Determinants of Health   Financial Resource Strain: Not on file  Food Insecurity: Not on file  Transportation Needs: Not on file  Physical Activity: Not on file  Stress: Not on file  Social Connections: Not on file     Family History: The patient's family history includes Asthma in his father; Cancer in his father; Heart attack in his paternal uncle; Hyperlipidemia in his mother; Hypertension in his father. There is no history of Stroke.  ROS:   Please see the history of present illness.    All other systems reviewed and are negative.  EKGs/Labs/Other Studies Reviewed:    The following studies were reviewed today:  January 14, 2020 TEE personally reviewed Left ventricular function normal, 55% Right ventricular function normal No left atrial appendage thrombus Moderate plaque in the ascending aorta  December 15, 2015  left heart catheterization  The left ventricular systolic function is normal.  LV end diastolic pressure is mildly elevated.  The left ventricular ejection fraction is 55-65% by visual estimate.  Prox RCA lesion, 30 %stenosed.  Mid RCA lesion, 60 %stenosed.  Mid RCA to Dist RCA lesion, 60 %stenosed.  Dist RCA lesion, 99 %stenosed.  Ost LAD lesion, 0 %stenosed.  Mid Cx lesion, 0 %stenosed.  Ost 2nd Mrg to 2nd Mrg lesion, 40 %stenosed.  A STENT SYNERGY DES 2.25X12 drug eluting stent was successfully placed.  1st Diag lesion, 95 %stenosed.  Post intervention, there is a 0% residual stenosis.  1. 2 vessel obstructive CAD - 95% first diagonal - 99% distal RCA. This is just distal to prior stent and appears chronic with left to right collaterals. The vessel is very small in this area and involves with PLOM and PDA bifurcation 2. Continued patency of stents in the proximal LAD and mid LCx. 3. Normal LV function with mildly elevated LVEDP 4. Successful stenting of the first diagonal with DES.     January 11, 2020 EKG Atrial flutter     Jun 20, 2018 EKG Atrial fibrillation     Recent Labs: 01/12/2020: Hemoglobin 16.5; Platelets 185 01/13/2020: ALT 29 01/14/2020: BUN 9; Creatinine, Ser 0.81; Magnesium 1.9; Potassium 3.8; Sodium 135 02/11/2020: BNP 25.3; TSH 1.040  Recent Lipid Panel Labs (Brief)          Component Value Date/Time   CHOL 149 02/06/2016 0749   TRIG 99 02/06/2016 0749   HDL 43 02/06/2016 0749   CHOLHDL 3.5 02/06/2016 0749   VLDL 20 02/06/2016 0749   LDLCALC 86 02/06/2016 0749      Physical Exam:    VS:  BP 128/72    Pulse 60    Ht 5\' 11"  (1.803 m)    Wt 258 lb (117 kg)    SpO2 97%    BMI 35.98 kg/m        Wt Readings from Last 3 Encounters:  03/10/20 258 lb (117 kg)  02/11/20 260 lb (117.9 kg)  01/14/20 254 lb 6.4 oz (115.4 kg)     GEN:  Well nourished, well developed in no acute distress HEENT: Normal NECK:  No JVD; No carotid bruits LYMPHATICS: No lymphadenopathy CARDIAC: RRR, no murmurs, rubs, gallops RESPIRATORY:  Clear to auscultation without rales, wheezing or rhonchi  ABDOMEN: Soft, non-tender, non-distended MUSCULOSKELETAL:  No edema; No deformity  SKIN: Warm and dry NEUROLOGIC:  Alert and oriented x 3 PSYCHIATRIC:  Normal affect   ASSESSMENT:    1. Persistent atrial fibrillation ([place])   2. H/O intracranial hemorrhage   3. Arteriovenous malformation of cerebral vessels   4. Coronary artery disease involving native coronary artery of native heart without angina pectoris    PLAN:    In order of problems listed above:  1. Persistent atrial fibrillation and flutter Symptomatic.  CHA2DS2-VASc of at least 2 for hypertension and coronary/aortic disease.  Has a history of AVMs and intracranial hemorrhage which precludes use of chronic anticoagulation.  He has tolerated short courses of anticoagulation in the past surrounding the times of cardioversion.  Patient would like to pursue long-term prophylaxis against stroke without the use of long-term anticoagulants.  We discussed the watchman implant during today's appointment at length.  His wife was present for our discussion.  I think he is an excellent candidate for the watchman device.  As a part of the treatment plan for his atrial fibrillation and flutter which is required prior cardioversions, we discussed addressing his atrial arrhythmias first before pursuing watchman implant.  We discussed the atrial fibrillation ablation procedure in detail during today's visit including the risks, expected recovery time and expected efficacy.  They would like to proceed with a staged PVI/CTI followed 6 to 8 weeks later by  the watchman implant.  He would require 3 weeks of anticoagulation prior to the ablation.  After the ablation he would continue 5 mg of Eliquis twice daily until the time of watchman implant.  At the time of watchman implant he would  decrease his dose of Eliquis to 2.5 mg twice daily in addition to an 81 mg aspirin daily.  I have seen Peter Barnett in the office today who is being considered for a Watchman left atrial appendage closure device. I believe they will benefit from this procedure given their history of atrial fibrillation, CHA2DS2-VASc score of 2 and unadjusted ischemic stroke rate of 2.2% per year. Unfortunately, the patient is not felt to be a long term anticoagulation candidate secondary to a history of intracranial hemorrhage. The patient's chart has been reviewed and I feel that they would be a candidate for short term oral anticoagulation after Watchman implant.   It is my belief that after undergoing a LAA closure procedure, Peter Barnett will not need long term anticoagulation which eliminates anticoagulation side effects and major bleeding risk.   Procedural risks for the Watchman implant have been reviewed with the patient including a 0.5% risk of stroke, <1% risk of perforation and <1% risk of device embolization.    The published clinical data on the safety and effectiveness of WATCHMAN include but are not limited to the following: - Holmes DR, Mechele Claude, Sick P et al. for the PROTECT AF Investigators. Percutaneous closure of the left atrial appendage versus warfarin therapy for prevention of stroke in patients with atrial fibrillation: a randomised non-inferiority trial. Lancet 2009; 374: 534-42. Mechele Claude, Doshi SK, Abelardo Diesel D et al. on behalf of the PROTECT AF Investigators. Percutaneous Left Atrial Appendage Closure for Stroke Prophylaxis in Patients With Atrial Fibrillation 2.3-Year Follow-up of the PROTECT AF (Watchman Left Atrial Appendage System for Embolic Protection in Patients With Atrial Fibrillation) Trial. Circulation 2013; 127:720-729. - Alli O, Doshi S,  Kar S, Reddy VY, Sievert H et al. Quality of Life Assessment in the Randomized PROTECT AF (Percutaneous Closure of the Left  Atrial Appendage Versus Warfarin Therapy for Prevention of Stroke in Patients With Atrial Fibrillation) Trial of Patients at Risk for Stroke With Nonvalvular Atrial Fibrillation. J Am Coll Cardiol 2013; 19:5093-2. Vertell Limber DR, Tarri Abernethy, Price M, [place], Sievert H, Doshi S, Huber K, Reddy V. Prospective randomized evaluation of the Watchman left atrial appendage Device in patients with atrial fibrillation versus long-term warfarin therapy; the PREVAIL trial. Journal of the [employer] of Cardiology, Vol. 4, No. 1, 2014, 1-11. - Kar S, Doshi SK, Sadhu A, Horton R, Osorio J et al. Primary outcome evaluation of a next-generation left atrial appendage closure device: results from the PINNACLE FLX trial. Circulation 2021;143(18)1754-1762.    After today's visit with the patient which was dedicated solely for shared decision making visit regarding LAA closure device, the patient decided to proceed with the LAA appendage closure procedure scheduled to be done in the near future at [facility]. Prior to the procedure, I would like to obtain a gated CT scan of the chest with contrast timed for PV/LA visualization.    2.  History of intracranial hemorrhage, AVMs Has tolerated short courses of anticoagulation in the past.  Given his history of intracranial hemorrhage, I believe he would be an excellent candidate for a watchman procedure with the goal of avoiding long-term anticoagulation.  3.  Coronary artery disease No ischemic symptoms during today's  visit.      --------------------------------------------------------------------------- I have seen, examined the patient, and reviewed the above assessment and plan.    Plan for PVI and CTI today.   [name], MD 04/21/2020 10:41 AM

## 2020-04-21 NOTE — Transfer of Care (Signed)
Immediate Anesthesia Transfer of Care Note  Patient: Kathyrn Drown Recore  Procedure(s) Performed: ATRIAL FIBRILLATION ABLATION (N/A )  Patient Location: PACU  Anesthesia Type:General  Level of Consciousness: awake, oriented and patient cooperative  Airway & Oxygen Therapy: Patient Spontanous Breathing and Patient connected to nasal cannula oxygen  Post-op Assessment: Report given to RN and Post -op Vital signs reviewed and stable  Post vital signs: Reviewed  Last Vitals:  Vitals Value Taken Time  BP 116/68 04/21/20 1428  Temp    Pulse 80 04/21/20 1431  Resp 14 04/21/20 1429  SpO2 95 % 04/21/20 1431  Vitals shown include unvalidated device data.  Last Pain:  Vitals:   04/21/20 0843  TempSrc:   PainSc: 9          Complications: No complications documented.

## 2020-04-21 NOTE — Anesthesia Procedure Notes (Signed)
Procedure Name: Intubation Date/Time: 04/21/2020 11:38 AM Performed by: Candis Shine, CRNA Pre-anesthesia Checklist: Patient identified, Emergency Drugs available, Suction available and Patient being monitored Patient Re-evaluated:Patient Re-evaluated prior to induction Oxygen Delivery Method: Circle System Utilized Preoxygenation: Pre-oxygenation with 100% oxygen Induction Type: IV induction Ventilation: Oral airway inserted - appropriate to patient size and Two handed mask ventilation required Laryngoscope Size: Glidescope and 4 Grade View: Grade I Tube type: Oral Tube size: 7.5 mm Number of attempts: 1 Airway Equipment and Method: Stylet and Oral airway Placement Confirmation: ETT inserted through vocal cords under direct vision,  positive ETCO2 and breath sounds checked- equal and bilateral Secured at: 23 cm Tube secured with: Tape Dental Injury: Teeth and Oropharynx as per pre-operative assessment  Difficulty Due To: Difficulty was anticipated and Difficult Airway- due to limited oral opening

## 2020-04-21 NOTE — Discharge Instructions (Signed)
Post procedure care instructions No driving for 4 days. No lifting over 5 lbs for 1 week. No vigorous or sexual activity for 1 week. You may return to work/your usual activities on 04/29/20. Keep procedure site clean & dry. If you notice increased pain, swelling, bleeding or pus, call/return!  You may shower after 24 hours, but no soaking in baths/hot tubs/pools for 1 week.      Cardiac Ablation, Care After This sheet gives you information about how to care for yourself after your procedure. Your health care provider may also give you more specific instructions. If you have problems or questions, contact your health care provider. What can I expect after the procedure? After the procedure, it is common to have:  Bruising around the insertion site.  Tenderness around the insertion site.  Skipped heartbeats.  Tiredness (fatigue). Follow these instructions at home: Insertion site care  Follow instructions from your health care provider about how to take care of your insertion site. Make sure you: ? Wash your hands with soap and water for at least 20 seconds before and after you change your bandage (dressing). If soap and water are not available, use hand sanitizer. ? Change your dressing as told by your health care provider. ? Leave stitches (sutures), skin glue, or adhesive strips in place. These skin closures may need to stay in place for up to 2 weeks. If adhesive strip edges start to loosen and curl up, you may trim the loose edges. Do not remove adhesive strips completely unless your health care provider tells you to do that.  Check your insertion site every day for signs of infection. Check for: ? More redness, swelling, or pain. ? Fluid or blood. ? Warmth. ? Pus or a bad smell.  If your insertion site starts to bleed, lie down on your back, apply firm pressure to the area, and contact your health care provider.   Driving  If you were given a sedative during the procedure, it can  affect you for several hours. Do not drive or operate machinery until your health care provider says that it is safe.  Ask your health care provider when it is safe for you to drive again after the procedure.   Activity  Avoid activities that take a lot of effort for at least 3 days after your procedure.  Do not lift anything that is heavier than 10 lb (4.5 kg), or the limit that you are told, until your health care provider says that it is safe.  Return to your normal activities as told by your health care provider. Ask your health care provider what activities are safe for you.   General instructions  Take over-the-counter and prescription medicines only as told by your health care provider.  Do not use any products that contain nicotine or tobacco, such as cigarettes, e-cigarettes, and chewing tobacco. If you need help quitting, ask your health care provider.  Do not take baths, swim, or use a hot tub until your health care provider approves. Ask your health care provider if you may take showers. You may only be allowed to take sponge baths.  Do not drink alcohol for 24 hours after your procedure.  Keep all follow-up visits as told by your health care provider. This is important. Contact a health care provider if you:  Notice these things around the catheter insertion site: ? More redness, swelling, or pain. ? Fluid or blood that stops after applying firm pressure to the area. ?  Warmth when you touch the area. ? Pus or a bad smell.  Have a fever.  Are sweating a lot.  Feel nauseous.  Have pain or numbness in the arm or leg closest to your insertion site. Get help right away if:  Your insertion site suddenly swells.  Your insertion site is bleeding and the bleeding does not stop after applying firm pressure to the area.  You have chest pain or discomfort that spreads to your neck, jaw, or arm.  You have a fast or irregular heartbeat.  You have shortness of  breath.  You are dizzy or light-headed and feel the need to lie down. These symptoms may represent a serious problem that is an emergency. Do not wait to see if the symptoms will go away. Get medical help right away. Call your local emergency services (911 in the [place] Do not drive yourself to the hospital. Summary  After the procedure, it is common to have bruising and tenderness at the insertion site in your groin or your neck.  Check your insertion site every day for more redness, swelling, or pain. These are signs of infection.  Contact a health care provider if you notice any signs of infection. Also, contact a health care provider if you start sweating a lot, feel nauseous, or have pain or numbness in the arm or leg closest to your insertion site.  Get help right away if your puncture site is bleeding and the bleeding does not stop after applying firm pressure to the area. This is a medical emergency. This information is not intended to replace advice given to you by your health care provider. Make sure you discuss any questions you have with your health care provider. Document Revised: 12/04/2018 Document Reviewed: 12/04/2018 Elsevier Patient Education  2021 [place] have an appointment set up with the [facility].  Multiple studies have shown that being followed by a dedicated atrial fibrillation clinic in addition to the standard care you receive from your other physicians improves health. We believe that enrollment in the atrial fibrillation clinic will allow [place] to better care for you.   The phone number to the [facility] is [phone]. The clinic is staffed Monday through Friday from 8:30am to 5pm.  Parking Directions: The clinic is located in the Heart and Vascular Building connected to [facility]. 1)From [address] turn on to [address] and go to the 3rd entrance  (Heart and Vascular entrance) on the right. 2)Look to  the right for Heart &Vascular Parking Garage. 3)A code for the entrance is required, for April is 2231.   4)Take the elevators to the 1st floor. Registration is in the room with the glass walls at the end of the hallway.  If you have any trouble parking or locating the clinic, please don't hesitate to call [phone].

## 2020-04-22 ENCOUNTER — Encounter (HOSPITAL_COMMUNITY): Payer: Self-pay | Admitting: Cardiology

## 2020-04-22 NOTE — Anesthesia Postprocedure Evaluation (Signed)
Anesthesia Post Note  Patient: Michall Noffke Ogata  Procedure(s) Performed: ATRIAL FIBRILLATION ABLATION (N/A )     Patient location during evaluation: PACU Anesthesia Type: General Level of consciousness: awake and alert Pain management: pain level controlled Vital Signs Assessment: post-procedure vital signs reviewed and stable Respiratory status: spontaneous breathing, nonlabored ventilation, respiratory function stable and patient connected to nasal cannula oxygen Cardiovascular status: blood pressure returned to baseline and stable Postop Assessment: no apparent nausea or vomiting Anesthetic complications: no   No complications documented.  Last Vitals:  Vitals:   04/21/20 1720 04/21/20 1745  BP: 118/75 123/71  Pulse: 62 63  Resp:    Temp:    SpO2: 97% 98%    Last Pain:  Vitals:   04/22/20 1513  TempSrc:   PainSc: 0-No pain                 Belenda Cruise P Thayden Lemire

## 2020-04-23 DIAGNOSIS — R69 Illness, unspecified: Secondary | ICD-10-CM | POA: Diagnosis not present

## 2020-04-23 DIAGNOSIS — Z8679 Personal history of other diseases of the circulatory system: Secondary | ICD-10-CM | POA: Diagnosis not present

## 2020-04-23 DIAGNOSIS — I480 Paroxysmal atrial fibrillation: Secondary | ICD-10-CM | POA: Diagnosis not present

## 2020-04-23 DIAGNOSIS — G40909 Epilepsy, unspecified, not intractable, without status epilepticus: Secondary | ICD-10-CM | POA: Diagnosis not present

## 2020-04-24 DIAGNOSIS — M71572 Other bursitis, not elsewhere classified, left ankle and foot: Secondary | ICD-10-CM | POA: Diagnosis not present

## 2020-04-24 DIAGNOSIS — M722 Plantar fascial fibromatosis: Secondary | ICD-10-CM | POA: Diagnosis not present

## 2020-04-29 ENCOUNTER — Encounter: Payer: Self-pay | Admitting: Cardiovascular Disease

## 2020-05-01 DIAGNOSIS — M71572 Other bursitis, not elsewhere classified, left ankle and foot: Secondary | ICD-10-CM | POA: Diagnosis not present

## 2020-05-08 DIAGNOSIS — M71572 Other bursitis, not elsewhere classified, left ankle and foot: Secondary | ICD-10-CM | POA: Diagnosis not present

## 2020-05-08 DIAGNOSIS — M722 Plantar fascial fibromatosis: Secondary | ICD-10-CM | POA: Diagnosis not present

## 2020-05-15 DIAGNOSIS — M71571 Other bursitis, not elsewhere classified, right ankle and foot: Secondary | ICD-10-CM | POA: Diagnosis not present

## 2020-05-19 ENCOUNTER — Other Ambulatory Visit: Payer: Self-pay

## 2020-05-19 ENCOUNTER — Encounter (HOSPITAL_COMMUNITY): Payer: Self-pay | Admitting: Physician Assistant

## 2020-05-19 ENCOUNTER — Ambulatory Visit (HOSPITAL_COMMUNITY)
Admit: 2020-05-19 | Discharge: 2020-05-19 | Disposition: A | Discharge status: Home / Self Care | Payer: Medicare HMO | Attending: Physician Assistant | Admitting: Physician Assistant

## 2020-05-19 ENCOUNTER — Ambulatory Visit: Payer: Medicare HMO | Admitting: Cardiovascular Disease

## 2020-05-19 VITALS — BP 108/68 | HR 54 | Ht 71.0 in | Wt 253.6 lb

## 2020-05-19 DIAGNOSIS — I10 Essential (primary) hypertension: Secondary | ICD-10-CM | POA: Diagnosis not present

## 2020-05-19 DIAGNOSIS — D6869 Other thrombophilia: Secondary | ICD-10-CM

## 2020-05-19 DIAGNOSIS — Q282 Arteriovenous malformation of cerebral vessels: Secondary | ICD-10-CM | POA: Diagnosis not present

## 2020-05-19 DIAGNOSIS — I2510 Atherosclerotic heart disease of native coronary artery without angina pectoris: Secondary | ICD-10-CM | POA: Diagnosis not present

## 2020-05-19 DIAGNOSIS — Z7901 Long term (current) use of anticoagulants: Secondary | ICD-10-CM | POA: Insufficient documentation

## 2020-05-19 DIAGNOSIS — Z6835 Body mass index (BMI) 35.0-35.9, adult: Secondary | ICD-10-CM | POA: Insufficient documentation

## 2020-05-19 DIAGNOSIS — Z8249 Family history of ischemic heart disease and other diseases of the circulatory system: Secondary | ICD-10-CM | POA: Insufficient documentation

## 2020-05-19 DIAGNOSIS — Z885 Allergy status to narcotic agent status: Secondary | ICD-10-CM | POA: Diagnosis not present

## 2020-05-19 DIAGNOSIS — I4819 Other persistent atrial fibrillation: Secondary | ICD-10-CM

## 2020-05-19 DIAGNOSIS — E785 Hyperlipidemia, unspecified: Secondary | ICD-10-CM | POA: Insufficient documentation

## 2020-05-19 DIAGNOSIS — Z8673 Personal history of transient ischemic attack (TIA), and cerebral infarction without residual deficits: Secondary | ICD-10-CM | POA: Insufficient documentation

## 2020-05-19 DIAGNOSIS — E669 Obesity, unspecified: Secondary | ICD-10-CM | POA: Diagnosis not present

## 2020-05-19 DIAGNOSIS — Z87891 Personal history of nicotine dependence: Secondary | ICD-10-CM | POA: Diagnosis not present

## 2020-05-19 DIAGNOSIS — Z91048 Other nonmedicinal substance allergy status: Secondary | ICD-10-CM | POA: Insufficient documentation

## 2020-05-19 DIAGNOSIS — Z79899 Other long term (current) drug therapy: Secondary | ICD-10-CM | POA: Diagnosis not present

## 2020-05-19 NOTE — Progress Notes (Signed)
Primary Care Physician: [name], MD Primary Cardiologist: Dr [name] Primary Electrophysiologist: Dr Quentin Ore Referring Physician: Dr Orvan Falconer Altschuler is a 60 y.o. 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 Patient is on Eliquis for a CHADS2VASC score of 4. Patient is s/p afib and flutter ablation 04/21/20 with Dr Quentin Ore. He reports that he has done well since the procedure with no heart racing or palpitations. He is in SR today. He denies CP, swallowing pain, or groin issues. There is also a plan for Watchman device 06/19/20 due to is inability to tolerate long term anticoagulation with his h/o cerebral AVMs. He denies any bleeding issues on anticoagulation currently.   Today, he denies symptoms of palpitations, chest pain, shortness of breath, orthopnea, PND, lower extremity edema, dizziness, presyncope, syncope, snoring, daytime somnolence, bleeding, or neurologic sequela. The patient is tolerating medications without difficulties and is otherwise without complaint today.    Atrial Fibrillation Risk Factors:  he does not have symptoms or diagnosis of sleep apnea. he does not have a history of rheumatic fever. he does not have a history of alcohol use.   he has a BMI of Body mass index is 35.37 kg/m.Marland Kitchen Filed Weights   05/19/20 1142  Weight: 115 kg    Family History  Problem Relation Age of Onset  . Hyperlipidemia Mother   . Cancer Father        Mesothelioma  . Hypertension Father   . Asthma Father   . Heart attack Paternal Uncle        In 69s too  . Stroke Neg Hx      Atrial Fibrillation Management history:  Previous antiarrhythmic drugs: none Previous cardioversions: 06/2018, 01/2020 Previous ablations: 04/21/20 fib and flutter CHADS2VASC score: 2 Anticoagulation history: Eliquis   Past Medical History:   Diagnosis Date  . Atypical mole   . CAD (coronary artery disease)    a. MI/DES to Cx 2009 with staged PCI of LAD with DES and distal RCA with BMS. b. NSTEMI 12/2015 s/p DES to D1, known occlusion of dRCA tx medically.  . Chronic lower back pain    Still with recurrent left sided LBP with paresthesias down left leg --primarily with activities: pain pill use is avg <90 tabs per mo  . COPD (chronic obstructive pulmonary disease) ([place])    NOTED on CT chest 11/2013 done for lung ca screening.  Hosp for acute exac 11/2014.  Spirometry not supportive of this dx as of pulm eval 2017, though.  Spireva trial by Dr. Elsworth Soho 02/2015.  Marland Kitchen GERD (gastroesophageal reflux disease)   . Heart attack ([place]) 12/12/2015  . History of adenomatous polyp of colon 02/2009;02/2014   Recall 5 yrs ([facility])  . History of kidney stones   . HTN (hypertension)   . Hyperlipidemia   . Intracranial hemorrhage ([place]) 12/27/14   small acute hemorrhage in gliotic medial R frontal lobe  . Memory loss   . MI (myocardial infarction) ([place]) 2009   "just had arm pain; never any chest pain"  . Migraine syndrome 10/07/2011    a couple per month usually, but worsening 2017 so Dr. Krista Blue adjusted med  . 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 Seizures ([place])    last one was fall of 2016.  Complex partial sz d/o.  Marland Kitchen Short-term memory loss 10/07/2011   "  post brain OR & from his seizures"--worsening 2017, Dr. Krista Blue in neurology doing w/u.  Marland Kitchen Stroke ([place]) 12/2014  . Tobacco abuse   . Urethral stricture since 1981   Recurrent dilation has been required [facility] urology)   Past Surgical History:  Procedure Laterality Date  . ATRIAL FIBRILLATION ABLATION N/A 04/21/2020   Procedure: ATRIAL FIBRILLATION ABLATION;  Surgeon: [name], MD;  Location: [facility];  Service: Cardiovascular;  Laterality: N/A;  . BACK SURGERY  11/25/2015  . CARDIAC CATHETERIZATION  2011   [place], records unavailable: pt reports  "normal".  . CARDIAC CATHETERIZATION N/A 12/15/2015   Procedure: Left Heart Cath and Coronary Angiography;  Surgeon: Peter M Martinique, MD;  Location: [facility] [facility];  Service: Cardiovascular;  Laterality: N/A;  . CARDIAC CATHETERIZATION N/A 12/15/2015   Procedure: Coronary Stent Intervention;  Surgeon: Peter M Martinique, MD;  Location: [facility];  Service: Cardiovascular;  Laterality: N/A;  . CARDIOVERSION N/A 06/21/2018   Procedure: CARDIOVERSION;  Surgeon: [name], MD;  Location: [facility] ENDOSCOPY;  Service: Cardiovascular;  Laterality: N/A;  . CARDIOVERSION N/A 01/14/2020   Procedure: CARDIOVERSION;  Surgeon: Buford Dresser, MD;  Location: [facility] ENDOSCOPY;  Service: Cardiovascular;  Laterality: N/A;  . COLONOSCOPY W/ POLYPECTOMY  02/2009;02/2014   Recall 5 yr (aden poly, hyperplast polyp, int and ext hemorr).  Next recall is 02/2019.  Marland Kitchen CORONARY ANGIOPLASTY WITH STENT PLACEMENT  2009   3 DES to LCx; still with known distal RCA/PDA occlusion.  EF 60% by LV-gram.  . CRANIOTOMY FOR AVM  1981   Dx'd after pt began having seizures.  . CT ANGIOGRAM (CEREBRAL)  12/31/14   NORMAL--plan per neuro is to repeat this in 3 mo.  Pt states it was repeated Spring 2017 and was "fine"  . CYSTOSCOPY WITH URETHRAL DILATATION N/A 03/27/2019   Procedure: CYSTOSCOPY WITH URETHRAL DILATATION;  Surgeon: Irine Seal, MD;  Location: [facility];  Service: Urology;  Laterality: N/A;  . LUMBAR [place] SURGERY  2008; 2009   Dr. Joya Salm  . LUMBAR LAMINECTOMY/DECOMPRESSION MICRODISCECTOMY N/A 11/25/2015   Procedure: Lumbar three- four Lumbar four- five Laminectomy/Foraminotomy;  Surgeon: Leeroy Cha, MD;  Location: [facility];  Service: Neurosurgery;  Laterality: N/A;  L3-4 L4-5 Laminectomy/Foraminotomy/possible Lumbar Drain  . TEE WITHOUT CARDIOVERSION N/A 06/21/2018   Procedure: TRANSESOPHAGEAL ECHOCARDIOGRAM (TEE);  Surgeon: Acie Fredrickson Wonda Cheng, MD;  Location: [facility];  Service: Cardiovascular;  Laterality: N/A;  .  TEE WITHOUT CARDIOVERSION N/A 01/14/2020   Procedure: TRANSESOPHAGEAL ECHOCARDIOGRAM (TEE);  Surgeon: Buford Dresser, MD;  Location: [facility] ENDOSCOPY;  Service: Cardiovascular;  Laterality: N/A;  . TONSILLECTOMY     "I was little"  . TRANSTHORACIC ECHOCARDIOGRAM  12/07/14   Normal LV size/fxn, EF 60%, normal valves.  Marland Kitchen URETEROSCOPY WITH HOLMIUM LASER LITHOTRIPSY Left 03/27/2019   Procedure: cysto with dilation of uretheral sticture, ureteroscopy and left retrograde pyelogram.;  Surgeon: Irine Seal, MD;  Location: [facility];  Service: Urology;  Laterality: Left;    Current Outpatient Medications  Medication Sig Dispense Refill  . acetaminophen (TYLENOL) 500 MG tablet Take 1 tablet (500 mg total) by mouth every 6 (six) hours as needed for headache. 30 tablet 0  . albuterol (PROVENTIL HFA;VENTOLIN HFA) 108 (90 BASE) MCG/ACT inhaler Inhale 2 puffs into the lungs every 6 (six) hours as needed for wheezing or shortness of breath. 1 Inhaler 2  . apixaban (ELIQUIS) 5 MG TABS tablet Take 1 tablet (5 mg total) by mouth 2 (two) times daily. 60 tablet  11  . Cholecalciferol (VITAMIN D3) 50 MCG (2000 UT) TABS Take 2,000 Units by mouth daily.    Marland Kitchen docusate sodium (COLACE) 100 MG capsule Take 100 mg by mouth 2 (two) times daily.     . famotidine (PEPCID) 20 MG tablet Take 20 mg by mouth daily before breakfast.     . HYDROcodone-acetaminophen (NORCO) 10-325 MG per tablet Take 1 tablet by mouth every 8 (eight) hours as needed (for pain).     . methylPREDNISolone (MEDROL DOSEPAK) 4 MG TBPK tablet Take by mouth as directed.    . metoprolol tartrate (LOPRESSOR) 25 MG tablet Take 1 tablet (25 mg total) by mouth 2 (two) times daily. 180 tablet 1  . nitroGLYCERIN (NITROSTAT) 0.4 MG SL tablet Place 1 tablet (0.4 mg total) under the tongue every 5 (five) minutes as needed for chest pain (CP or SOB). 25 tablet 3  . oxcarbazepine (TRILEPTAL) 600 MG tablet TAKE ONE TABLET BY MOUTH TWICE DAILY 180 tablet 0  . pantoprazole  (PROTONIX) 40 MG tablet Take 1 tablet (40 mg total) by mouth daily. 45 tablet 0  . potassium chloride SA (KLOR-CON M20) 20 MEQ tablet Take 1 tablet (20 mEq total) by mouth daily. 90 tablet 3  . rosuvastatin (CRESTOR) 40 MG tablet Take 1 tablet (40 mg total) by mouth daily. 15 tablet 0  . torsemide (DEMADEX) 20 MG tablet Take 1 tablet (20 mg total) by mouth daily. 90 tablet 1   No current facility-administered medications for this encounter.    Allergies  Allergen Reactions  . Brilinta [Ticagrelor] Other (See Comments)    HISTORY OF A BRAIN BLEED- was told by an MD to not take   . Tramadol Other (See Comments)    SEIZURES and "Staring off spells"  . Atorvastatin Other (See Comments)    MYALGIAS  . Ezetimibe Other (See Comments)    Muscle pain  . Morphine And Related Nausea And Vomiting    Social History   Socioeconomic History  . Marital status: Married    Spouse name: [name]  . Number of children: 2  . Years of education: 10 th  . Highest education level: Not on file  Occupational History  . Occupation: retired     Fish farm manager: RETIRED    Comment: retired  Tobacco Use  . Smoking status: Former Smoker    Packs/day: 3.00    Years: 27.00    Pack years: 81.00    Types: Cigarettes    Quit date: 02/08/2001    Years since quitting: 19.2  . Smokeless tobacco: Never Used  Vaping Use  . Vaping Use: Never used  Substance and Sexual Activity  . Alcohol use: Yes    Alcohol/week: 7.0 standard drinks    Types: 7 Cans of beer per week    Comment: 12 oz beer   . Drug use: No  . Sexual activity: Yes  Other Topics Concern  . Not on file  Social History Narrative   Patient lives at home with his wife [name]).   Two adult children.   Retired from [employer] approx age 70 due to medical reasons (disabled due to memory issues, seizure d/o and chronic LBP).   Education 10th grade.   Right handed.   Caffeine two cups of tea and two cups of coffee.   Tobacco 45 pack-yr hx, quit approx age  70.  Alcohol: 1-2 beers per night.     No hx of alc or drug problems.   No exercise.   Diet:  regular   Social Determinants of Health   Financial Resource Strain: Not on file  Food Insecurity: Not on file  Transportation Needs: Not on file  Physical Activity: Not on file  Stress: Not on file  Social Connections: Not on file  Intimate Partner Violence: Not on file     ROS- All systems are reviewed and negative except as per the HPI above.  Physical Exam: Vitals:   05/19/20 1142  BP: 108/68  Pulse: (!) 54  Weight: 115 kg  Height: 5\' 11"  (1.803 m)    GEN- The patient is a well appearing obese male, alert and oriented x 3 today.   Head- normocephalic, atraumatic Eyes-  Sclera clear, conjunctiva pink Ears- hearing intact Oropharynx- clear Neck- supple  Lungs- Clear to ausculation bilaterally, normal work of breathing Heart- Regular rate and rhythm, no murmurs, rubs or gallops  GI- soft, NT, ND, + BS Extremities- no clubbing, cyanosis, or edema MS- no significant deformity or atrophy Skin- no rash or lesion Psych- euthymic mood, full affect Neuro- strength and sensation are intact  Wt Readings from Last 3 Encounters:  05/19/20 115 kg  04/21/20 115.7 kg  03/10/20 117 kg    EKG today demonstrates  SB Vent. rate 54 BPM PR interval 166 ms QRS duration 116 ms QT/QTcB 442/419 ms  Echo 01/13/20 demonstrated  1. Left ventricular ejection fraction, by estimation, is 55% with beat to  beat variability. The left ventricle has low normal function. Left  ventricular endocardial border not optimally defined to evaluate regional  wall motion. There is mild left  ventricular hypertrophy. Left ventricular diastolic parameters are  indeterminate, however medial e' is 9 cm/s, and diastolic function is  likely grossly preserved.  2. Right ventricular systolic function is mildly reduced. The right  ventricular size is normal. Tricuspid regurgitation signal is inadequate  for  assessing PA pressure.  3. The mitral valve is normal in structure. No evidence of mitral valve  regurgitation. No evidence of mitral stenosis.  4. The aortic valve is tricuspid. There is mild calcification of the  aortic valve. Aortic valve regurgitation is trivial. No aortic stenosis is  present.  5. The inferior vena cava is dilated in size with >50% respiratory  variability, suggesting right atrial pressure of 8 mmHg.   Epic records are reviewed at length today  HAS-BLED score 4 Hypertension Yes  Abnormal renal and liver function (Dialysis, transplant, Cr >2.26 mg/dL /Cirrhosis or Bilirubin >2x Normal or AST/ALT/AP >3x Normal) No  Stroke Yes  Bleeding Yes  Labile INR (Unstable/high INR) No  Elderly (>65) No  Drugs or alcohol (? 8 drinks/week, anti-plt or NSAID) Yes    CHA2DS2-VASc Score = 4  The patient's score is based upon: CHF History: No HTN History: Yes Diabetes History: No Stroke History: Yes Vascular Disease History: Yes Age Score: 0 Gender Score: 0      ASSESSMENT AND PLAN: 1. Persistent Atrial Fibrillation/atrial flutter The patient's CHA2DS2-VASc score is 4, indicating a 4.8% annual risk of stroke.   S/p afib and flutter ablation on 04/21/20. He appears to be maintaining SR. He will continue 5 mg of Eliquis twice daily until the time of Watchman implant. Then he will decrease his dose of Eliquis to 2.5 mg twice daily in addition to an 81 mg ASA daily. Continue Lopressor 25 mg BID  CT scan 04/14/20 The left atrial appendage is a small broccoli type without thrombus. A 20 mm Watchman FLX device is recommended based on the above  landing zone measurements (15.7 mm maximum diameter; 21.5% compression).  CXR 01/11/20 reviewed  Will have him return for cbc/bmet and Watchman procedure instructions.  2. Secondary Hypercoagulable State (ICD10:  D68.69) The patient is at significant risk for stroke/thromboembolism based upon his CHA2DS2-VASc Score of 4.  Continue  Apixaban (Eliquis). See plans above.  3. Obesity Body mass index is 35.37 kg/m. Lifestyle modification was discussed at length including regular exercise and weight reduction.  4. CAD No anginal symptoms.  5. HTN Stable, no changes today.   Follow up with Coletta Memos as scheduled. AF clinic 1-2 before Watchman for preprocedure labs/instructions.    [facility] [address] [place], [place] [address] [phone] 05/19/2020 11:51 AM

## 2020-05-20 ENCOUNTER — Ambulatory Visit: Payer: Medicare HMO | Admitting: Cardiology

## 2020-05-20 NOTE — Progress Notes (Signed)
Cardiology Clinic Note   Patient Name: Peter Barnett Date of Encounter: 05/22/2020  Primary Care Provider:  [name], MD Primary Cardiologist:  Ellis Parents, MD  Patient Profile    Peter Barnett 60 year old male presents the clinic today for follow-up evaluation of his hypertension, coronary artery disease, and atrial fibrillation.  Past Medical History    Past Medical History:  Diagnosis Date  . Atypical mole   . CAD (coronary artery disease)    a. MI/DES to Cx 2009 with staged PCI of LAD with DES and distal RCA with BMS. b. NSTEMI 12/2015 s/p DES to D1, known occlusion of dRCA tx medically.  . Chronic lower back pain    Still with recurrent left sided LBP with paresthesias down left leg --primarily with activities: pain pill use is avg <90 tabs per mo  . COPD (chronic obstructive pulmonary disease) ([place])    NOTED on CT chest 11/2013 done for lung ca screening.  Hosp for acute exac 11/2014.  Spirometry not supportive of this dx as of pulm eval 2017, though.  Spireva trial by Dr. Elsworth Soho 02/2015.  Marland Kitchen GERD (gastroesophageal reflux disease)   . Heart attack ([place]) 12/12/2015  . History of adenomatous polyp of colon 02/2009;02/2014   Recall 5 yrs ([facility])  . History of kidney stones   . HTN (hypertension)   . Hyperlipidemia   . Intracranial hemorrhage ([place]) 12/27/14   small acute hemorrhage in gliotic medial R frontal lobe  . Memory loss   . MI (myocardial infarction) ([place]) 2009   "just had arm pain; never any chest pain"  . Migraine syndrome 10/07/2011    a couple per month usually, but worsening 2017 so Dr. Krista Blue adjusted med  . 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 Seizures ([place])    last one was fall of 2016.  Complex partial sz d/o.  Marland Kitchen Short-term memory loss 10/07/2011   "post brain OR & from his seizures"--worsening 2017, Dr. Krista Blue in neurology doing w/u.  Marland Kitchen Stroke ([place]) 12/2014  . Tobacco abuse   . Urethral stricture since 1981   Recurrent  dilation has been required [facility] urology)   Past Surgical History:  Procedure Laterality Date  . ATRIAL FIBRILLATION ABLATION N/A 04/21/2020   Procedure: ATRIAL FIBRILLATION ABLATION;  Surgeon: [name], MD;  Location: [place] [facility];  Service: Cardiovascular;  Laterality: N/A;  . BACK SURGERY  11/25/2015  . CARDIAC CATHETERIZATION  2011   [place], records unavailable: pt reports "normal".  . CARDIAC CATHETERIZATION N/A 12/15/2015   Procedure: Left Heart Cath and Coronary Angiography;  Surgeon: Peter M Martinique, MD;  Location: [facility];  Service: Cardiovascular;  Laterality: N/A;  . CARDIAC CATHETERIZATION N/A 12/15/2015   Procedure: Coronary Stent Intervention;  Surgeon: Peter M Martinique, MD;  Location: [place] [facility];  Service: Cardiovascular;  Laterality: N/A;  . CARDIOVERSION N/A 06/21/2018   Procedure: CARDIOVERSION;  Surgeon: [name], MD;  Location: [facility] ENDOSCOPY;  Service: Cardiovascular;  Laterality: N/A;  . CARDIOVERSION N/A 01/14/2020   Procedure: CARDIOVERSION;  Surgeon: Buford Dresser, MD;  Location: [facility] ENDOSCOPY;  Service: Cardiovascular;  Laterality: N/A;  . COLONOSCOPY W/ POLYPECTOMY  02/2009;02/2014   Recall 5 yr (aden poly, hyperplast polyp, int and ext hemorr).  Next recall is 02/2019.  Marland Kitchen CORONARY ANGIOPLASTY WITH STENT PLACEMENT  2009   3 DES to LCx; still with known distal RCA/PDA occlusion.  EF 60% by LV-gram.  . CRANIOTOMY FOR AVM  1981   Dx'd after pt began having seizures.  . CT ANGIOGRAM (CEREBRAL)  12/31/14   NORMAL--plan per neuro is to repeat this in 3 mo.  Pt states it was repeated Spring 2017 and was "fine"  . CYSTOSCOPY WITH URETHRAL DILATATION N/A 03/27/2019   Procedure: CYSTOSCOPY WITH URETHRAL DILATATION;  Surgeon: Irine Seal, MD;  Location: [facility];  Service: Urology;  Laterality: N/A;  . LUMBAR [place] SURGERY  2008; 2009   Dr. Joya Salm  . LUMBAR LAMINECTOMY/DECOMPRESSION MICRODISCECTOMY N/A 11/25/2015   Procedure: Lumbar  three- four Lumbar four- five Laminectomy/Foraminotomy;  Surgeon: Leeroy Cha, MD;  Location: [place];  Service: Neurosurgery;  Laterality: N/A;  L3-4 L4-5 Laminectomy/Foraminotomy/possible Lumbar Drain  . TEE WITHOUT CARDIOVERSION N/A 06/21/2018   Procedure: TRANSESOPHAGEAL ECHOCARDIOGRAM (TEE);  Surgeon: Acie Fredrickson Wonda Cheng, MD;  Location: [place];  Service: Cardiovascular;  Laterality: N/A;  . TEE WITHOUT CARDIOVERSION N/A 01/14/2020   Procedure: TRANSESOPHAGEAL ECHOCARDIOGRAM (TEE);  Surgeon: Buford Dresser, MD;  Location: [facility] ENDOSCOPY;  Service: Cardiovascular;  Laterality: N/A;  . TONSILLECTOMY     "I was little"  . TRANSTHORACIC ECHOCARDIOGRAM  12/07/14   Normal LV size/fxn, EF 60%, normal valves.  Marland Kitchen URETEROSCOPY WITH HOLMIUM LASER LITHOTRIPSY Left 03/27/2019   Procedure: cysto with dilation of uretheral sticture, ureteroscopy and left retrograde pyelogram.;  Surgeon: Irine Seal, MD;  Location: [facility];  Service: Urology;  Laterality: Left;    Allergies  Allergies  Allergen Reactions  . Brilinta [Ticagrelor] Other (See Comments)    HISTORY OF A BRAIN BLEED- was told by an MD to not take   . Tramadol Other (See Comments)    SEIZURES and "Staring off spells"  . Atorvastatin Other (See Comments)    MYALGIAS  . Ezetimibe Other (See Comments)    Muscle pain  . Morphine And Related Nausea And Vomiting    History of Present Illness    [name] has a PMH of essential hypertension, coronary artery disease, NSTEMI, atrial fibrillation, COPD, GERD, lumbar radiculopathy, left arm paresthesia, memory loss, HLD, DOE, obesity, and history of seizures.  Chest Vascor 4 on apixaban.  He underwent A. fib/flutter ablation 04/21/2020.  On follow-up with atrial fibrillation clinic he was in sinus rhythm.  He denied chest pain, difficulty swallowing, and groin issues.  Plan for insertion of watchman device 06/19/2020.  This is in response to intolerance of long-term anticoagulation with history  of  AVMs.  During his follow-up visit with the atrial fibrillation clinic he denied palpitations, chest pain, shortness of breath, orthopnea, PND and lower extremity edema.  He denied dizziness, presyncope and syncope.  He reported he was tolerating his medications well.  He presents the clinic today for follow-up evaluation states he feels well.  He did however have a recent left foot injury where he ruptured his plantar fascia.  He is receiving treatment from orthopedics and is currently wearing a walking boot.  He denies increased work of breathing.  However he did notice one episode of shortness of breath that was less than an hour on 05/21/2020.  He feels this is related to COPD and allergies.  He reports that he is wearing a mask with doing yard work because he is sensitive to pollen.  He will have watchman device implanted in May.  I will have him maintain his physical activity, give him salty 6 diet sheet, and have him follow-up in 6 months.  Today he denies chest pain, shortness of breath, lower extremity edema, fatigue, palpitations, melena, hematuria,  hemoptysis, diaphoresis, weakness, presyncope, syncope, orthopnea, and PND.   Home Medications    Prior to Admission medications   Medication Sig Start Date End Date Taking? Authorizing Provider  acetaminophen (TYLENOL) 500 MG tablet Take 1 tablet (500 mg total) by mouth every 6 (six) hours as needed for headache. 12/16/15   Cheryln Manly, NP  albuterol (PROVENTIL HFA;VENTOLIN HFA) 108 (90 BASE) MCG/ACT inhaler Inhale 2 puffs into the lungs every 6 (six) hours as needed for wheezing or shortness of breath. 12/08/14   Reyne Dumas, MD  apixaban (ELIQUIS) 5 MG TABS tablet Take 1 tablet (5 mg total) by mouth 2 (two) times daily. 03/31/20   [name], MD  Cholecalciferol (VITAMIN D3) 50 MCG (2000 UT) TABS Take 2,000 Units by mouth daily.    [provider]  docusate sodium (COLACE) 100 MG capsule Take 100 mg by mouth 2 (two)  times daily.     [provider]  famotidine (PEPCID) 20 MG tablet Take 20 mg by mouth daily before breakfast.     [provider]  HYDROcodone-acetaminophen (NORCO) 10-325 MG per tablet Take 1 tablet by mouth every 8 (eight) hours as needed (for pain).  08/07/14   [provider]  methylPREDNISolone (MEDROL DOSEPAK) 4 MG TBPK tablet Take by mouth as directed. 05/15/20   [provider]  metoprolol tartrate (LOPRESSOR) 25 MG tablet Take 1 tablet (25 mg total) by mouth 2 (two) times daily. 03/24/20   O'Neal, Cassie Freer, MD  nitroGLYCERIN (NITROSTAT) 0.4 MG SL tablet Place 1 tablet (0.4 mg total) under the tongue every 5 (five) minutes as needed for chest pain (CP or SOB). 12/16/15 01/10/21  Jettie Booze, MD  oxcarbazepine (TRILEPTAL) 600 MG tablet TAKE ONE TABLET BY MOUTH TWICE DAILY 06/23/16   Marcial Pacas, MD  pantoprazole (PROTONIX) 40 MG tablet Take 1 tablet (40 mg total) by mouth daily. 04/21/20 06/05/20  Baldwin Jamaica, PA-C  potassium chloride SA (KLOR-CON M20) 20 MEQ tablet Take 1 tablet (20 mEq total) by mouth daily. 02/11/20 05/11/20  Geralynn Rile, MD  rosuvastatin (CRESTOR) 40 MG tablet Take 1 tablet (40 mg total) by mouth daily. 07/25/17   Jettie Booze, MD  torsemide (DEMADEX) 20 MG tablet Take 1 tablet (20 mg total) by mouth daily. 02/11/20 05/11/20  O'NealCassie Freer, MD    Family History    Family History  Problem Relation Age of Onset  . Hyperlipidemia Mother   . Cancer Father        Mesothelioma  . Hypertension Father   . Asthma Father   . Heart attack Paternal Uncle        In 51s too  . Stroke Neg Hx    He indicated that his mother is alive. He indicated that his father is deceased. He indicated that the status of his paternal uncle is unknown. He indicated that the status of his neg hx is unknown.  Social History    Social History   Socioeconomic History  . Marital status: Married    Spouse name: [name]  . Number  of children: 2  . Years of education: 10 th  . Highest education level: Not on file  Occupational History  . Occupation: retired     Fish farm manager: RETIRED    Comment: retired  Tobacco Use  . Smoking status: Former Smoker    Packs/day: 3.00    Years: 27.00    Pack years: 81.00    Types: Cigarettes  Quit date: 02/08/2001    Years since quitting: 19.2  . Smokeless tobacco: Never Used  Vaping Use  . Vaping Use: Never used  Substance and Sexual Activity  . Alcohol use: Yes    Alcohol/week: 7.0 standard drinks    Types: 7 Cans of beer per week    Comment: 12 oz beer   . Drug use: No  . Sexual activity: Yes  Other Topics Concern  . Not on file  Social History Narrative   Patient lives at home with his wife [name]).   Two adult children.   Retired from [employer] approx age 31 due to medical reasons (disabled due to memory issues, seizure d/o and chronic LBP).   Education 10th grade.   Right handed.   Caffeine two cups of tea and two cups of coffee.   Tobacco 45 pack-yr hx, quit approx age 32.  Alcohol: 1-2 beers per night.     No hx of alc or drug problems.   No exercise.   Diet: regular   Social Determinants of Health   Financial Resource Strain: Not on file  Food Insecurity: Not on file  Transportation Needs: Not on file  Physical Activity: Not on file  Stress: Not on file  Social Connections: Not on file  Intimate Partner Violence: Not on file     Review of Systems    General:  No chills, fever, night sweats or weight changes.  Cardiovascular:  No chest pain, dyspnea on exertion, edema, orthopnea, palpitations, paroxysmal nocturnal dyspnea. Dermatological: No rash, lesions/masses Respiratory: No cough, dyspnea Urologic: No hematuria, dysuria Abdominal:   No nausea, vomiting, diarrhea, bright red blood per rectum, melena, or hematemesis Neurologic:  No visual changes, wkns, changes in mental status. All other systems reviewed and are otherwise negative except as noted  above.  Physical Exam    VS:  BP 120/80   Pulse 60   Ht 5\' 11"  (1.803 m)   Wt 253 lb (114.8 kg)   SpO2 96%   BMI 35.29 kg/m  , BMI Body mass index is 35.29 kg/m. GEN: Well nourished, well developed, in no acute distress. HEENT: normal. Neck: Supple, no JVD, carotid bruits, or masses. Cardiac: RRR, no murmurs, rubs, or gallops. No clubbing, cyanosis, edema.  Radials/DP/PT 2+ and equal bilaterally.  Respiratory:  Respirations regular and unlabored, clear to auscultation bilaterally. GI: Soft, nontender, nondistended, BS + x 4. MS: no deformity or atrophy.  Left foot in walking boot rupture plantar fascia Skin: warm and dry, no rash. Neuro:  Strength and sensation are intact. Psych: Normal affect.  Accessory Clinical Findings    Recent Labs: 01/13/2020: ALT 29 01/14/2020: Magnesium 1.9 02/11/2020: BNP 25.3; TSH 1.040 03/31/2020: BUN 15; Creatinine, Ser 1.02; Hemoglobin 16.0; Platelets 195; Potassium 4.2; Sodium 140   Recent Lipid Panel    Component Value Date/Time   CHOL 149 02/06/2016 0749   TRIG 99 02/06/2016 0749   HDL 43 02/06/2016 0749   CHOLHDL 3.5 02/06/2016 0749   VLDL 20 02/06/2016 0749   LDLCALC 86 02/06/2016 0749    ECG personally reviewed by me today-none today.  Echocardiogram 01/14/2020 IMPRESSIONS    1. Left ventricular ejection fraction, by estimation, is 55 to 60%. The  left ventricle has normal function.  2. Right ventricular systolic function is normal. The right ventricular  size is normal.  3. No left atrial/left atrial appendage thrombus was detected.  4. The mitral valve is normal in structure. No evidence of mitral valve  regurgitation. No  evidence of mitral stenosis.  5. The aortic valve is tricuspid. Aortic valve regurgitation is trivial.  No aortic stenosis is present.  6. There is Moderate (Grade III) plaque involving the descending aorta.  7. Evidence of atrial level shunting detected by color flow Doppler.  There is a small patent  foramen ovale with predominantly left to right  shunting across the atrial septum.   Conclusion(s)/Recommendation(s): No LA/LAA thrombus identified. Successful  cardioversion performed with restoration of normal sinus rhythm.  Assessment & Plan   1.  Coronary artery disease-no chest pain today.  Underwent cardiac catheterization with PCI to his circumflex and LAD in 2009.  Received DES to D1 2017 and was noted to have 99% distal RCA with left-to-right collaterals. Continue apixaban, metoprolol, nitroglycerin, rosuvastatin Heart healthy low-sodium diet-salty 6 given Increase physical activity as tolerated  Persistent atrial fibrillation-heart rate today 60.  Underwent A. fib/flutter ablation 04/21/2020.  On follow-up with atrial fibrillation clinic he was in sinus rhythm.  He denied chest pain, difficulty swallowing, and groin issues.  Plan for insertion of watchman device 06/19/2020.  History of CVA 2016.  Right frontal AVM status post craniotomy 1981. Continue apixaban, metoprolol Heart healthy low-sodium diet-salty 6 given Increase physical activity as tolerated Avoid triggers chocolate, caffeine, EtOH, dehydration etc.  Chronic diastolic CHF-euvolemic today.  No increased activity intolerance or DOE.  Echocardiogram 01/14/2020 showed normal EF.  Details above. Continue metoprolol, torsemide, potassium Heart healthy low-sodium diet-salty 6 given Increase physical activity as tolerated  Essential hypertension-BP today 120/80.  Well-controlled at home. Continue metoprolol Heart healthy low-sodium diet-salty 6 given Increase physical activity as tolerated  Hyperlipidemia-LDL 86 on 02/06/2016 Continue rosuvastatin Heart healthy low-sodium high-fiber diet Increase physical activity as tolerated Follows with PCP  Disposition: Follow-up with Dr. [name] 6 months.  Jossie Ng. Mahnoor Mathisen NP-C    05/22/2020, 10:58 AM [place] [address] Office  [phone] Fax [phone]  Notice: This dictation was prepared with Dragon dictation along with smaller phrase technology. Any transcriptional errors that result from this process are unintentional and may not be corrected upon review.  I spent 13 minutes examining this patient, reviewing medications, and using patient centered shared decision making involving her cardiac care.  Prior to her visit I spent greater than 20 minutes reviewing her past medical history,  medications, and prior cardiac tests.

## 2020-05-22 ENCOUNTER — Other Ambulatory Visit: Payer: Self-pay

## 2020-05-22 ENCOUNTER — Ambulatory Visit: Payer: Medicare HMO | Admitting: General Practice

## 2020-05-22 ENCOUNTER — Encounter: Payer: Self-pay | Admitting: General Practice

## 2020-05-22 VITALS — BP 120/80 | HR 60 | Ht 71.0 in | Wt 253.0 lb

## 2020-05-22 DIAGNOSIS — I5032 Chronic diastolic (congestive) heart failure: Secondary | ICD-10-CM | POA: Diagnosis not present

## 2020-05-22 DIAGNOSIS — E782 Mixed hyperlipidemia: Secondary | ICD-10-CM | POA: Diagnosis not present

## 2020-05-22 DIAGNOSIS — M722 Plantar fascial fibromatosis: Secondary | ICD-10-CM | POA: Diagnosis not present

## 2020-05-22 DIAGNOSIS — I10 Essential (primary) hypertension: Secondary | ICD-10-CM | POA: Diagnosis not present

## 2020-05-22 DIAGNOSIS — I2510 Atherosclerotic heart disease of native coronary artery without angina pectoris: Secondary | ICD-10-CM | POA: Diagnosis not present

## 2020-05-22 DIAGNOSIS — M71572 Other bursitis, not elsewhere classified, left ankle and foot: Secondary | ICD-10-CM | POA: Diagnosis not present

## 2020-05-22 DIAGNOSIS — I4819 Other persistent atrial fibrillation: Secondary | ICD-10-CM

## 2020-05-22 NOTE — Patient Instructions (Addendum)
Medication Instructions:  The current medical regimen is effective;  continue present plan and medications as directed. Please refer to the Current Medication list given to you today.  *If you need a refill on your cardiac medications before your next appointment, please call your pharmacy*  Special Instructions TAKE AND LOG YOUR WEIGHT DAILY  PLEASE READ AND FOLLOW SALTY 6-ATTACHED-1,800mg  daily  Follow-Up: Your next appointment:  6 month(s) In Person with Eleonore Chiquito, MD OR IF UNAVAILABLE [place], FNP-C   Please call our office 2 months in advance to schedule this appointment   At [facility], you and your health needs are our priority.  As part of our continuing mission to provide you with exceptional heart care, we have created designated Provider Care Teams.  These Care Teams include your primary Cardiologist (physician) and Advanced Practice Providers (APPs -  Physician Assistants and Nurse Practitioners) who all work together to provide you with the care you need, when you need it.            6 SALTY THINGS TO AVOID     1,800MG  DAILY

## 2020-06-12 ENCOUNTER — Telehealth: Payer: Self-pay | Admitting: General Practice

## 2020-06-12 NOTE — Telephone Encounter (Signed)
Encounter not needed billing picked up

## 2020-06-16 ENCOUNTER — Other Ambulatory Visit: Payer: Self-pay

## 2020-06-16 ENCOUNTER — Other Ambulatory Visit (HOSPITAL_COMMUNITY)
Admission: RE | Admit: 2020-06-16 | Discharge: 2020-06-16 | Disposition: A | Discharge status: Home / Self Care | Payer: Medicare HMO | Source: Ambulatory Visit | Attending: Cardiology | Admitting: Cardiology

## 2020-06-16 ENCOUNTER — Ambulatory Visit (HOSPITAL_COMMUNITY)
Admission: RE | Admit: 2020-06-16 | Discharge: 2020-06-16 | Disposition: A | Discharge status: Home / Self Care | Payer: Medicare HMO | Source: Ambulatory Visit | Attending: Physician Assistant | Admitting: Physician Assistant

## 2020-06-16 DIAGNOSIS — Z20822 Contact with and (suspected) exposure to covid-19: Secondary | ICD-10-CM | POA: Insufficient documentation

## 2020-06-16 DIAGNOSIS — Z01812 Encounter for preprocedural laboratory examination: Secondary | ICD-10-CM | POA: Insufficient documentation

## 2020-06-16 LAB — BASIC METABOLIC PANEL
Anion gap: 8 (ref 5–15)
BUN: 10 mg/dL (ref 6–20)
CO2: 24 mmol/L (ref 22–32)
Calcium: 9.3 mg/dL (ref 8.9–10.3)
Chloride: 107 mmol/L (ref 98–111)
Creatinine, Ser: 0.89 mg/dL (ref 0.61–1.24)
GFR, Estimated: >60 mL/min (ref >60)
Glucose, Bld: 126 mg/dL — ABNORMAL HIGH (ref 70–99)
Potassium: 3.9 mmol/L (ref 3.5–5.1)
Sodium: 139 mmol/L (ref 135–145)

## 2020-06-16 LAB — CBC
HCT: 50.1 % (ref 39.0–52.0)
Hemoglobin: 16.7 g/dL (ref 13.0–17.0)
MCH: 30.2 pg (ref 26.0–34.0)
MCHC: 33.3 g/dL (ref 30.0–36.0)
MCV: 90.6 fL (ref 80.0–100.0)
Platelets: 200 10*3/uL (ref 150–400)
RBC: 5.53 MIL/uL (ref 4.22–5.81)
RDW: 13.1 % (ref 11.5–15.5)
WBC: 8.4 10*3/uL (ref 4.0–10.5)
nRBC: 0 % (ref 0.0–0.2)

## 2020-06-16 LAB — SARS CORONAVIRUS 2 (TAT 6-24 HRS): SARS Coronavirus 2: NEGATIVE

## 2020-06-16 NOTE — Patient Instructions (Signed)
Day of procedure start aspirin 81mg  once a day   LEFT ATRIAL APPENDAGE CLOSURE INSTRUCTIONS:  You are scheduled for a Left Atrial Appendage Device Implantation on Thursday, May 12th  1. Please arrive at the [facility] (Main Entrance A) at [facility]: [address] [place], [place] [address] at 530AM   (This time is two hours before your procedure to ensure your preparation). Free valet parking service is available. You are allowed ONE visitor in the waiting room during your procedure. Both you and your guest must wear masks. Special note: Every effort is made to have your procedure done on time. Please understand that emergencies sometimes delay scheduled procedures.  2.   Do not eat or drink after midnight prior to your procedure.     3.   Medication Instructions: Take ONLY the listed medications morning of procedure with a sip of water:  Metoprolol, aspirin  4. Plan for one night stay--bring personal belongings. When you are discharged, you will need someone to drive you home and stay with you for 24 hours.  5. Bring a current list of your medications and current insurance cards.  6. Please wear clothes that are easy to get on and off and wear slip-on shoes.  7. Ambrose Pancoast, the [facility], will arrange follow-up for you during your hospital stay and you will be discharged with your appointment dates/times. His direct number is [phone] if you need assistance.  **If you have any questions, do not hesitate to call the office! You will also be contacted the week of your procedure to confirm instructions.**

## 2020-06-18 ENCOUNTER — Encounter (HOSPITAL_COMMUNITY): Payer: Self-pay | Admitting: Cardiology

## 2020-06-18 NOTE — Progress Notes (Signed)
Chest x-ray - 01/11/20 (2V) EKG - 05/19/20 Stress Test - 11/30/17 ECHO - 01/13/20 Cardiac Cath - 12/15/15  Anesthesia review: Yes  Coronavirus Screening Covid test on 06/16/20 was negative.

## 2020-06-19 ENCOUNTER — Encounter (HOSPITAL_COMMUNITY)
Admission: RE | Disposition: A | Discharge status: Home / Self Care | Payer: Medicare HMO | Source: Home / Self Care | Attending: Cardiology

## 2020-06-19 ENCOUNTER — Encounter (HOSPITAL_COMMUNITY): Payer: Self-pay | Admitting: Cardiology

## 2020-06-19 ENCOUNTER — Inpatient Hospital Stay (HOSPITAL_COMMUNITY): Payer: Medicare HMO

## 2020-06-19 ENCOUNTER — Inpatient Hospital Stay (HOSPITAL_COMMUNITY): Payer: Medicare HMO | Admitting: Certified Registered"

## 2020-06-19 ENCOUNTER — Inpatient Hospital Stay (HOSPITAL_COMMUNITY)
Admission: RE | Admit: 2020-06-19 | Discharge: 2020-06-20 | DRG: 274 | Disposition: A | Discharge status: Home / Self Care | Payer: Medicare HMO | Attending: Cardiology | Admitting: Cardiology

## 2020-06-19 ENCOUNTER — Other Ambulatory Visit: Payer: Self-pay

## 2020-06-19 DIAGNOSIS — I252 Old myocardial infarction: Secondary | ICD-10-CM

## 2020-06-19 DIAGNOSIS — Z79899 Other long term (current) drug therapy: Secondary | ICD-10-CM

## 2020-06-19 DIAGNOSIS — G40802 Other epilepsy, not intractable, without status epilepticus: Secondary | ICD-10-CM | POA: Diagnosis not present

## 2020-06-19 DIAGNOSIS — E785 Hyperlipidemia, unspecified: Secondary | ICD-10-CM | POA: Diagnosis present

## 2020-06-19 DIAGNOSIS — I2510 Atherosclerotic heart disease of native coronary artery without angina pectoris: Secondary | ICD-10-CM | POA: Diagnosis present

## 2020-06-19 DIAGNOSIS — I4891 Unspecified atrial fibrillation: Secondary | ICD-10-CM | POA: Diagnosis present

## 2020-06-19 DIAGNOSIS — I480 Paroxysmal atrial fibrillation: Secondary | ICD-10-CM

## 2020-06-19 DIAGNOSIS — K219 Gastro-esophageal reflux disease without esophagitis: Secondary | ICD-10-CM | POA: Diagnosis present

## 2020-06-19 DIAGNOSIS — Z006 Encounter for examination for normal comparison and control in clinical research program: Secondary | ICD-10-CM | POA: Diagnosis not present

## 2020-06-19 DIAGNOSIS — J449 Chronic obstructive pulmonary disease, unspecified: Secondary | ICD-10-CM | POA: Diagnosis not present

## 2020-06-19 DIAGNOSIS — Z955 Presence of coronary angioplasty implant and graft: Secondary | ICD-10-CM | POA: Diagnosis not present

## 2020-06-19 DIAGNOSIS — I4892 Unspecified atrial flutter: Secondary | ICD-10-CM | POA: Diagnosis not present

## 2020-06-19 DIAGNOSIS — I4819 Other persistent atrial fibrillation: Secondary | ICD-10-CM | POA: Diagnosis not present

## 2020-06-19 DIAGNOSIS — I10 Essential (primary) hypertension: Secondary | ICD-10-CM | POA: Diagnosis not present

## 2020-06-19 DIAGNOSIS — Z87891 Personal history of nicotine dependence: Secondary | ICD-10-CM | POA: Diagnosis not present

## 2020-06-19 DIAGNOSIS — Z8673 Personal history of transient ischemic attack (TIA), and cerebral infarction without residual deficits: Secondary | ICD-10-CM

## 2020-06-19 DIAGNOSIS — Z20822 Contact with and (suspected) exposure to covid-19: Secondary | ICD-10-CM | POA: Diagnosis present

## 2020-06-19 DIAGNOSIS — I080 Rheumatic disorders of both mitral and aortic valves: Secondary | ICD-10-CM | POA: Diagnosis not present

## 2020-06-19 DIAGNOSIS — Z7982 Long term (current) use of aspirin: Secondary | ICD-10-CM

## 2020-06-19 HISTORY — PX: LEFT ATRIAL APPENDAGE OCCLUSION: EP1229

## 2020-06-19 HISTORY — PX: TEE WITHOUT CARDIOVERSION: SHX5443

## 2020-06-19 LAB — TYPE AND SCREEN
ABO/RH(D): A POS
Antibody Screen: NEGATIVE

## 2020-06-19 LAB — ABO/RH: ABO/RH(D): A POS

## 2020-06-19 LAB — SURGICAL PCR SCREEN
MRSA, PCR: NEGATIVE
Staphylococcus aureus: NEGATIVE

## 2020-06-19 LAB — POCT ACTIVATED CLOTTING TIME: Activated Clotting Time: 315 seconds

## 2020-06-19 SURGERY — LEFT ATRIAL APPENDAGE OCCLUSION
Anesthesia: General

## 2020-06-19 MED ORDER — METOPROLOL TARTRATE 25 MG PO TABS
25.0000 mg | ORAL_TABLET | Freq: Two times a day (BID) | ORAL | Status: DC
  Administered 2020-06-19 – 2020-06-20 (×3): 25 mg via ORAL
  Filled 2020-06-19 (×3): qty 1

## 2020-06-19 MED ORDER — SODIUM CHLORIDE 0.9% FLUSH
3.0000 mL | Freq: Two times a day (BID) | INTRAVENOUS | Status: DC
  Administered 2020-06-19: 3 mL via INTRAVENOUS

## 2020-06-19 MED ORDER — IOHEXOL 350 MG/ML SOLN
INTRAVENOUS | Status: DC | PRN
  Administered 2020-06-19: 10 mL

## 2020-06-19 MED ORDER — PROPOFOL 10 MG/ML IV BOLUS
INTRAVENOUS | Status: DC | PRN
  Administered 2020-06-19: 30 mg via INTRAVENOUS
  Administered 2020-06-19: 170 mg via INTRAVENOUS

## 2020-06-19 MED ORDER — ONDANSETRON HCL 4 MG/2ML IJ SOLN: 4.0000 mg | Freq: Four times a day (QID) | INTRAMUSCULAR | Status: DC | PRN

## 2020-06-19 MED ORDER — FAMOTIDINE 20 MG PO TABS
20.0000 mg | ORAL_TABLET | Freq: Every day | ORAL | Status: DC
  Administered 2020-06-20: 20 mg via ORAL
  Filled 2020-06-19: qty 1

## 2020-06-19 MED ORDER — SUGAMMADEX SODIUM 200 MG/2ML IV SOLN
INTRAVENOUS | Status: DC | PRN
  Administered 2020-06-19: 200 mg via INTRAVENOUS

## 2020-06-19 MED ORDER — SODIUM CHLORIDE 0.9 % IV SOLN: 250.0000 mL | INTRAVENOUS | Status: DC | PRN

## 2020-06-19 MED ORDER — HEPARIN SODIUM (PORCINE) 1000 UNIT/ML IJ SOLN
INTRAMUSCULAR | Status: DC | PRN
  Administered 2020-06-19: 15000 [IU] via INTRAVENOUS

## 2020-06-19 MED ORDER — CHLORHEXIDINE GLUCONATE 0.12 % MT SOLN
15.0000 mL | Freq: Once | OROMUCOSAL | Status: AC
  Administered 2020-06-19: 15 mL via OROMUCOSAL
  Filled 2020-06-19 (×2): qty 15

## 2020-06-19 MED ORDER — FENTANYL CITRATE (PF) 100 MCG/2ML IJ SOLN
INTRAMUSCULAR | Status: DC | PRN
  Administered 2020-06-19: 100 ug via INTRAVENOUS

## 2020-06-19 MED ORDER — PHENYLEPHRINE HCL-NACL 10-0.9 MG/250ML-% IV SOLN
INTRAVENOUS | Status: DC | PRN
  Administered 2020-06-19: 25 ug/min via INTRAVENOUS

## 2020-06-19 MED ORDER — HYDROCODONE-ACETAMINOPHEN 10-325 MG PO TABS
1.0000 | ORAL_TABLET | Freq: Three times a day (TID) | ORAL | Status: DC | PRN
  Administered 2020-06-19 – 2020-06-20 (×3): 1 via ORAL
  Filled 2020-06-19 (×3): qty 1

## 2020-06-19 MED ORDER — HEPARIN (PORCINE) IN NACL 2000-0.9 UNIT/L-% IV SOLN
INTRAVENOUS | Status: DC | PRN
  Administered 2020-06-19: 1000 mL

## 2020-06-19 MED ORDER — ONDANSETRON HCL 4 MG/2ML IJ SOLN
INTRAMUSCULAR | Status: DC | PRN
  Administered 2020-06-19: 4 mg via INTRAVENOUS

## 2020-06-19 MED ORDER — ROCURONIUM BROMIDE 10 MG/ML (PF) SYRINGE
PREFILLED_SYRINGE | INTRAVENOUS | Status: DC | PRN
  Administered 2020-06-19: 60 mg via INTRAVENOUS

## 2020-06-19 MED ORDER — HEPARIN (PORCINE) IN NACL 2000-0.9 UNIT/L-% IV SOLN
INTRAVENOUS | Status: AC
  Filled 2020-06-19: qty 1000

## 2020-06-19 MED ORDER — SODIUM CHLORIDE 0.9 % IV SOLN: INTRAVENOUS | Status: DC

## 2020-06-19 MED ORDER — ACETAMINOPHEN 325 MG PO TABS
650.0000 mg | ORAL_TABLET | ORAL | Status: DC | PRN
  Filled 2020-06-19: qty 2

## 2020-06-19 MED ORDER — APIXABAN 5 MG PO TABS
5.0000 mg | ORAL_TABLET | Freq: Two times a day (BID) | ORAL | Status: DC
  Administered 2020-06-19 – 2020-06-20 (×3): 5 mg via ORAL
  Filled 2020-06-19 (×3): qty 1

## 2020-06-19 MED ORDER — HEPARIN (PORCINE) IN NACL 1000-0.9 UT/500ML-% IV SOLN
INTRAVENOUS | Status: DC | PRN
  Administered 2020-06-19: 500 mL

## 2020-06-19 MED ORDER — PROTAMINE SULFATE 10 MG/ML IV SOLN
INTRAVENOUS | Status: DC | PRN
  Administered 2020-06-19: 25 mg via INTRAVENOUS

## 2020-06-19 MED ORDER — LACTATED RINGERS IV SOLN: INTRAVENOUS | Status: DC

## 2020-06-19 MED ORDER — DEXAMETHASONE SODIUM PHOSPHATE 10 MG/ML IJ SOLN
INTRAMUSCULAR | Status: DC | PRN
  Administered 2020-06-19: 5 mg via INTRAVENOUS

## 2020-06-19 MED ORDER — CEFAZOLIN SODIUM-DEXTROSE 2-4 GM/100ML-% IV SOLN
2.0000 g | INTRAVENOUS | Status: AC
  Administered 2020-06-19: 2 g via INTRAVENOUS
  Filled 2020-06-19: qty 100

## 2020-06-19 MED ORDER — OXCARBAZEPINE 300 MG PO TABS
600.0000 mg | ORAL_TABLET | Freq: Two times a day (BID) | ORAL | Status: DC
  Administered 2020-06-19 – 2020-06-20 (×3): 600 mg via ORAL
  Filled 2020-06-19 (×3): qty 2

## 2020-06-19 MED ORDER — MIDAZOLAM HCL 5 MG/5ML IJ SOLN
INTRAMUSCULAR | Status: DC | PRN
  Administered 2020-06-19: 2 mg via INTRAVENOUS

## 2020-06-19 MED ORDER — HEPARIN (PORCINE) IN NACL 1000-0.9 UT/500ML-% IV SOLN
INTRAVENOUS | Status: AC
  Filled 2020-06-19: qty 500

## 2020-06-19 MED ORDER — SODIUM CHLORIDE 0.9% FLUSH: 3.0000 mL | INTRAVENOUS | Status: DC | PRN

## 2020-06-19 MED ORDER — LIDOCAINE 2% (20 MG/ML) 5 ML SYRINGE
INTRAMUSCULAR | Status: DC | PRN
  Administered 2020-06-19: 60 mg via INTRAVENOUS

## 2020-06-19 SURGICAL SUPPLY — 20 items
CATH DIAG 6FR PIGTAIL ANGLED (CATHETERS) ×2 IMPLANT
DEVICE CLOSURE PERCLS PRGLD 6F (VASCULAR PRODUCTS) ×2 IMPLANT
DILATOR VESSEL 38 20CM 11FR (INTRODUCER) ×2 IMPLANT
DILATOR VESSEL 38 20CM 16FR (INTRODUCER) ×2 IMPLANT
KIT HEART LEFT (KITS) ×2 IMPLANT
KIT VERSACROSS LRG ACCESS (CATHETERS) ×2 IMPLANT
PACK CARDIAC CATHETERIZATION (CUSTOM PROCEDURE TRAY) ×2 IMPLANT
PAD DEFIB LIFELINK (PAD) ×2 IMPLANT
PERCLOSE PROGLIDE 6F (VASCULAR PRODUCTS) ×4
SHEATH PERFORMER 16FR 30 (SHEATH) ×2 IMPLANT
SHEATH PINNACLE 8F 10CM (SHEATH) ×2 IMPLANT
SHEATH PROBE COVER 6X72 (BAG) ×2 IMPLANT
STOPCOCK MORSE 400PSI 3WAY (MISCELLANEOUS) ×2 IMPLANT
TRANSDUCER W/STOPCOCK (MISCELLANEOUS) ×2 IMPLANT
TUBING CIL FLEX 10 FLL-RA (TUBING) ×4 IMPLANT
WATCHMAN FLX 20 (Prosthesis & Implant Heart) ×2 IMPLANT
WATCHMAN FLX PROCEDURE DEVICE (KITS) ×2 IMPLANT
WATCHMAN PROCED TRUSEAL ACCESS (SHEATH) ×2 IMPLANT
WATCHMAN TRUSEAL DOUBLE CURVE (SHEATH) ×2 IMPLANT
WIRE EMERALD 3MM-J .035X150CM (WIRE) ×2 IMPLANT

## 2020-06-19 NOTE — Discharge Summary (Signed)
ELECTROPHYSIOLOGY PROCEDURE DISCHARGE SUMMARY    Patient ID: Peter Barnett,  MRN: [id number], DOB/AGE: 1960/11/21 60 y.o.  Admit date: 06/19/2020 Discharge date: 06/20/20  Primary Care Physician: [name], MD  Primary Cardiologist: Ellis Parents, MD  Electrophysiologist: [name], MD  Primary Discharge Diagnosis:  Persistent Atrial Fibrillation/flutter Poor candidacy for long term anticoagulation due to h/o intracranial hemorrhage/AVMs CHA2DS2Vasc is 2 on Eliquis  Secondary Discharge Diagnosis:  1. CAD      PCI hx 2017 2. COPD 3. HTN 4. Stroke hx 5. Seizure d/o  Procedures This Admission:  1.Left atrial appendage occlusive device placement on 06/19/20 by Dr. Quentin Ore.   CONCLUSIONS:  1.Successful implantation of a WATCHMAN left atrial appendage occlusive device   2. TEE demonstrating no LAA thrombus 3. No early apparent complications.    Brief HPI: Peter Barnett is a 60 y.o. male with a history of Persistent Atrial Fibrillation/flutter The patient was seen as an outpatient and thought to be a poor candidate for continued anticoagulation due to h/o intracranial hemorrhage/AVMs. Risks, benefits, and alternatives to left atrial appendage occlusive device placement device were reviewed with the patient who wished to proceed.  The patient underwent intra-procedural TEE as above.   Hospital Course:  The patient was admitted and underwent Left Atrial appendage occlusive device placement with details as outlined above.  He was monitored on telemetry overnight which demonstrated SB 50's.  R groin was without complication on the day of discharge.   The patient feels well this morning, no CP, SOB, site pain, he was examined by Dr. Quentin Ore and considered to be stable for discharge.  Wound care and restrictions were reviewed with the patient.  Follow up with Dr. Quentin Ore is in place for 1 month and repeat TEE in approximately 45 days (both scheduled) post procedure to  ensure proper seal of the device. They will follow up with Dr. Quentin Ore in 12 weeks for post ablation follow up.    Discharge anticoagulation will be ASA 81mg  daily and Eliquis 2.5mg  BID, patient is aware They have 5mg  Eliquis at home, discussed with pharmacy from a tablet perspective OK to cut, though not scored and may be difficult. I have sent in a new RX for the patient of 2.5mg  tabs if cutting the tablet is difficult/cuts poorly  Physical Exam: Vitals:   06/19/20 2300 06/20/20 0300 06/20/20 0737 06/20/20 0745  BP: 113/77 103/73 123/79   Pulse: 69 64 (!) 58 (!) 55  Resp: 14 14 14 19   Temp: 97.6 F (36.4 C) 97.6 F (36.4 C) 98.5 F (36.9 C)   TempSrc: Oral Oral Oral   SpO2: 96% 95% 95% 93%  Weight:      Height:        GEN- The patient is well appearing, alert and oriented x 3 today.   HEENT: normocephalic, atraumatic; sclera clear, conjunctiva pink; hearing intact; oropharynx clear; neck supple  Lungs- CTA b/l, normal work of breathing.  No wheezes, rales, rhonchi Heart- RRR, no murmurs, rubs or gallops  GI- soft, non-tender, non-distended, bowel sounds present  Extremities- no clubbing, cyanosis, or edema; R groin is stable without hematoma/bruit MS- no significant deformity or atrophy Skin- warm and dry, no rash or lesion Psych- euthymic mood, full affect Neuro- strength and sensation are intact   Labs:   Lab Results  Component Value Date   WBC 8.4 06/16/2020   HGB 16.7 06/16/2020   HCT 50.1 06/16/2020   MCV 90.6 06/16/2020  PLT 200 06/16/2020    Recent Labs  Lab 06/20/20 0102  NA 136  K 4.1  CL 105  CO2 26  BUN 11  CREATININE 0.87  CALCIUM 8.9  GLUCOSE 120*     Discharge Medications:  Allergies as of 06/20/2020      Reactions   Brilinta [ticagrelor] Other (See Comments)   HISTORY OF A BRAIN BLEED- was told by an MD to not take    Tramadol Other (See Comments)   SEIZURES and "Staring off spells"   Atorvastatin Other (See Comments)   MYALGIAS    Ezetimibe Other (See Comments)   Muscle pain   Morphine And Related Nausea And Vomiting      Medication List    TAKE these medications   acetaminophen 500 MG tablet Commonly known as: TYLENOL Take 1 tablet (500 mg total) by mouth every 6 (six) hours as needed for headache.   albuterol 108 (90 Base) MCG/ACT inhaler Commonly known as: VENTOLIN HFA Inhale 2 puffs into the lungs every 6 (six) hours as needed for wheezing or shortness of breath.   apixaban 2.5 MG Tabs tablet Commonly known as: ELIQUIS Take 1 tablet (2.5 mg total) by mouth 2 (two) times daily. What changed:   medication strength  how much to take   aspirin EC 81 MG tablet Take 1 tablet (81 mg total) by mouth daily. Swallow whole.   docusate sodium 100 MG capsule Commonly known as: COLACE Take 100 mg by mouth 2 (two) times daily.   famotidine 20 MG tablet Commonly known as: PEPCID Take 20 mg by mouth daily before breakfast.   HYDROcodone-acetaminophen 10-325 MG tablet Commonly known as: NORCO Take 1 tablet by mouth every 8 (eight) hours as needed (for pain).   metoprolol tartrate 25 MG tablet Commonly known as: LOPRESSOR Take 1 tablet (25 mg total) by mouth 2 (two) times daily.   nitroGLYCERIN 0.4 MG SL tablet Commonly known as: NITROSTAT Place 1 tablet (0.4 mg total) under the tongue every 5 (five) minutes as needed for chest pain (CP or SOB).   oxcarbazepine 600 MG tablet Commonly known as: TRILEPTAL TAKE ONE TABLET BY MOUTH TWICE DAILY   rosuvastatin 40 MG tablet Commonly known as: CRESTOR Take 1 tablet (40 mg total) by mouth daily. What changed: when to take this   Vitamin D3 50 MCG (2000 UT) Tabs Take 2,000 Units by mouth daily.       Disposition: Home Discharge Instructions    Diet - low sodium heart healthy   Complete by: As directed    Increase activity slowly   Complete by: As directed       Follow-up Information    [name], MD Follow up.   Specialties: Cardiology,  Radiology Why: 07/25/20 @ 10:45AM Contact information: [place] [address] [phone]               Duration of Discharge Encounter: Greater than 30 minutes including physician time.  Signed, Baldwin Jamaica, PA-C  06/20/2020 9:42 AM

## 2020-06-19 NOTE — Plan of Care (Signed)

## 2020-06-19 NOTE — Progress Notes (Signed)
  Echocardiogram Echocardiogram Transesophageal with color, doppler, and 3D has been performed.  Darlina Sicilian M 06/19/2020, 9:26 AM

## 2020-06-19 NOTE — H&P (Signed)
Electrophysiology Office Note:    Date:  03/10/2020   ID:  Peter Barnett, DOB October 20, 1960, MRN [id number]  PCP:  [name], MD          [facility] Cardiologist:  Ellis Parents, MD  [facility] Electrophysiologist:  [name], MD   Referring MD: [name], MD   Chief Complaint: Paroxysmal atrial fibrillation, history of intracranial hemorrhage  History of Present Illness:    Peter Barnett is a 60 y.o. male who presents for an evaluation of paroxysmal atrial fibrillation and history of intracranial hemorrhage at the request of Dr. [name]. Their medical history includes coronary artery disease, COPD, hypertension, hyperlipidemia, seizures, stroke, cerebral AVMs, intracranial hemorrhage.  The patient was last seen by Dr. [name] February 11, 2020.  This was then the setting of a recent hospitalization for atrial flutter in December 2021.  He had been cardioverted in December 2021 for his atrial flutter and was on a short course of anticoagulation after this cardioversion.  He had been cardioverted in the past in 2020 for atrial fibrillation.  His CHA2DS2-VASc is 2 for hypertension and coronary artery disease.  He is interested in pursuing the watchman implant. [name] tells me that he is symptomatic when he is in atrial fibrillation with palpitations.       Past Medical History:  Diagnosis Date  . Atypical mole   . CAD (coronary artery disease)    a. MI/DES to Cx 2009 with staged PCI of LAD with DES and distal RCA with BMS. b. NSTEMI 12/2015 s/p DES to D1, known occlusion of dRCA tx medically.  . Chronic lower back pain    Still with recurrent left sided LBP with paresthesias down left leg --primarily with activities: pain pill use is avg <90 tabs per mo  . COPD (chronic obstructive pulmonary disease) ([place])    NOTED on CT chest 11/2013 done for lung ca screening.  Hosp for acute exac 11/2014.  Spirometry not supportive of this dx as of pulm  eval 2017, though.  Spireva trial by Dr. Elsworth Soho 02/2015.  Marland Kitchen GERD (gastroesophageal reflux disease)   . Heart attack ([place]) 12/12/2015  . History of adenomatous polyp of colon 02/2009;02/2014   Recall 5 yrs ([facility])  . History of kidney stones   . HTN (hypertension)   . Hyperlipidemia   . Intracranial hemorrhage ([place]) 12/27/14   small acute hemorrhage in gliotic medial R frontal lobe  . Memory loss   . MI (myocardial infarction) ([place]) 2009   "just had arm pain; never any chest pain"  . Migraine syndrome 10/07/2011    a couple per month usually, but worsening 2017 so Dr. Krista Blue adjusted med  . 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 Seizures ([place])    last one was fall of 2016.  Complex partial sz d/o.  Marland Kitchen Short-term memory loss 10/07/2011   "post brain OR & from his seizures"--worsening 2017, Dr. Krista Blue in neurology doing w/u.  Marland Kitchen Stroke ([place]) 12/2014  . Tobacco abuse   . Urethral stricture since 1981   Recurrent dilation has been required [facility] urology)         Past Surgical History:  Procedure Laterality Date  . BACK SURGERY  11/25/2015  . CARDIAC CATHETERIZATION  2011   [place], records unavailable: pt reports "normal".  . CARDIAC CATHETERIZATION N/A 12/15/2015   Procedure: Left Heart Cath and Coronary Angiography;  Surgeon: Peter M Martinique, MD;  Location: [place] CV LAB;  Service: Cardiovascular;  Laterality: N/A;  . CARDIAC CATHETERIZATION N/A 12/15/2015   Procedure: Coronary Stent Intervention;  Surgeon: Peter M Martinique, MD;  Location: [place] [facility];  Service: Cardiovascular;  Laterality: N/A;  . CARDIOVERSION N/A 06/21/2018   Procedure: CARDIOVERSION;  Surgeon: [name], MD;  Location: [facility] ENDOSCOPY;  Service: Cardiovascular;  Laterality: N/A;  . CARDIOVERSION N/A 01/14/2020   Procedure: CARDIOVERSION;  Surgeon: Buford Dresser, MD;  Location: [facility] ENDOSCOPY;  Service: Cardiovascular;  Laterality: N/A;  . COLONOSCOPY W/  POLYPECTOMY  02/2009;02/2014   Recall 5 yr (aden poly, hyperplast polyp, int and ext hemorr).  Next recall is 02/2019.  Marland Kitchen CORONARY ANGIOPLASTY WITH STENT PLACEMENT  2009   3 DES to LCx; still with known distal RCA/PDA occlusion.  EF 60% by LV-gram.  . CRANIOTOMY FOR AVM  1981   Dx'd after pt began having seizures.  . CT ANGIOGRAM (CEREBRAL)  12/31/14   NORMAL--plan per neuro is to repeat this in 3 mo.  Pt states it was repeated Spring 2017 and was "fine"  . CYSTOSCOPY WITH URETHRAL DILATATION N/A 03/27/2019   Procedure: CYSTOSCOPY WITH URETHRAL DILATATION;  Surgeon: Peter Seal, MD;  Location: [facility];  Service: Urology;  Laterality: N/A;  . LUMBAR [place] SURGERY  2008; 2009   Dr. Joya Salm  . LUMBAR LAMINECTOMY/DECOMPRESSION MICRODISCECTOMY N/A 11/25/2015   Procedure: Lumbar three- four Lumbar four- five Laminectomy/Foraminotomy;  Surgeon: Leeroy Cha, MD;  Location: [place];  Service: Neurosurgery;  Laterality: N/A;  L3-4 L4-5 Laminectomy/Foraminotomy/possible Lumbar Drain  . TEE WITHOUT CARDIOVERSION N/A 06/21/2018   Procedure: TRANSESOPHAGEAL ECHOCARDIOGRAM (TEE);  Surgeon: Acie Fredrickson Wonda Cheng, MD;  Location: [place];  Service: Cardiovascular;  Laterality: N/A;  . TEE WITHOUT CARDIOVERSION N/A 01/14/2020   Procedure: TRANSESOPHAGEAL ECHOCARDIOGRAM (TEE);  Surgeon: Buford Dresser, MD;  Location: [facility] ENDOSCOPY;  Service: Cardiovascular;  Laterality: N/A;  . TONSILLECTOMY     "I was little"  . TRANSTHORACIC ECHOCARDIOGRAM  12/07/14   Normal LV size/fxn, EF 60%, normal valves.  Marland Kitchen URETEROSCOPY WITH HOLMIUM LASER LITHOTRIPSY Left 03/27/2019   Procedure: cysto with dilation of uretheral sticture, ureteroscopy and left retrograde pyelogram.;  Surgeon: Peter Seal, MD;  Location: [facility];  Service: Urology;  Laterality: Left;    Current Medications: Active Medications      Current Meds  Medication Sig  . acetaminophen (TYLENOL) 500 MG tablet Take 1 tablet (500 mg total) by  mouth every 6 (six) hours as needed for headache. (Patient taking differently: Take 1,000 mg by mouth every 6 (six) hours as needed for headache.)  . albuterol (PROVENTIL HFA;VENTOLIN HFA) 108 (90 BASE) MCG/ACT inhaler Inhale 2 puffs into the lungs every 6 (six) hours as needed for wheezing or shortness of breath.  Marland Kitchen aspirin EC 81 MG tablet Take 81 mg by mouth at bedtime.   . Cholecalciferol (VITAMIN D3) 50 MCG (2000 UT) TABS Take 2,000 Units by mouth daily.  Marland Kitchen docusate sodium (COLACE) 100 MG capsule Take 100 mg by mouth 2 (two) times daily.   . famotidine (PEPCID) 20 MG tablet Take 20 mg by mouth daily before breakfast.   . HYDROcodone-acetaminophen (NORCO) 10-325 MG per tablet Take 1 tablet by mouth every 8 (eight) hours as needed (for pain).   . metoprolol tartrate (LOPRESSOR) 25 MG tablet Take 25 mg by mouth 2 (two) times daily.  . nitroGLYCERIN (NITROSTAT) 0.4 MG SL tablet Place 1 tablet (0.4 mg total) under the tongue every 5 (five) minutes as needed for chest pain (CP or SOB). (  Patient taking differently: Place 0.4 mg under the tongue every 5 (five) minutes as needed for chest pain (OR shortness of breath).)  . oxcarbazepine (TRILEPTAL) 600 MG tablet TAKE ONE TABLET BY MOUTH TWICE DAILY (Patient taking differently: Take 600 mg by mouth 2 (two) times daily.)  . potassium chloride SA (KLOR-CON M20) 20 MEQ tablet Take 1 tablet (20 mEq total) by mouth daily.  . rosuvastatin (CRESTOR) 40 MG tablet Take 1 tablet (40 mg total) by mouth daily. (Patient taking differently: Take 40 mg by mouth every other day.)  . torsemide (DEMADEX) 20 MG tablet Take 1 tablet (20 mg total) by mouth daily.       Allergies:   Brilinta [ticagrelor], Tramadol, Atorvastatin, Ezetimibe, and Morphine and related   Social History        Socioeconomic History  . Marital status: Married    Spouse name: [name]  . Number of children: 2  . Years of education: 10 th  . Highest education level: Not on file   Occupational History  . Occupation: retired     Fish farm manager: RETIRED    Comment: retired  Tobacco Use  . Smoking status: Former Smoker    Packs/day: 3.00    Years: 27.00    Pack years: 81.00    Types: Cigarettes    Quit date: 02/08/2001    Years since quitting: 19.0  . Smokeless tobacco: Never Used  Vaping Use  . Vaping Use: Never used  Substance and Sexual Activity  . Alcohol use: Yes    Alcohol/week: 7.0 standard drinks    Types: 7 Cans of beer per week    Comment: 12 oz beer   . Drug use: No  . Sexual activity: Yes  Other Topics Concern  . Not on file  Social History Narrative   Patient lives at home with his wife [name]).   Two adult children.   Retired from [employer] approx age 68 due to medical reasons (disabled due to memory issues, seizure d/o and chronic LBP).   Education 10th grade.   Right handed.   Caffeine two cups of tea and two cups of coffee.   Tobacco 45 pack-yr hx, quit approx age 40.  Alcohol: 1-2 beers per night.     No hx of alc or drug problems.   No exercise.   Diet: regular   Social Determinants of Health   Financial Resource Strain: Not on file  Food Insecurity: Not on file  Transportation Needs: Not on file  Physical Activity: Not on file  Stress: Not on file  Social Connections: Not on file     Family History: The patient's family history includes Asthma in his father; Cancer in his father; Heart attack in his paternal uncle; Hyperlipidemia in his mother; Hypertension in his father. There is no history of Stroke.  ROS:   Please see the history of present illness.    All other systems reviewed and are negative.  EKGs/Labs/Other Studies Reviewed:    The following studies were reviewed today:  January 14, 2020 TEE personally reviewed Left ventricular function normal, 55% Right ventricular function normal No left atrial appendage thrombus Moderate plaque in the ascending aorta  December 15, 2015  left heart catheterization  The left ventricular systolic function is normal.  LV end diastolic pressure is mildly elevated.  The left ventricular ejection fraction is 55-65% by visual estimate.  Prox RCA lesion, 30 %stenosed.  Mid RCA lesion, 60 %stenosed.  Mid RCA to Dist RCA lesion, 60 %stenosed.  Dist RCA lesion, 99 %stenosed.  Ost LAD lesion, 0 %stenosed.  Mid Cx lesion, 0 %stenosed.  Ost 2nd Mrg to 2nd Mrg lesion, 40 %stenosed.  A STENT SYNERGY DES 2.25X12 drug eluting stent was successfully placed.  1st Diag lesion, 95 %stenosed.  Post intervention, there is a 0% residual stenosis.  1. 2 vessel obstructive CAD - 95% first diagonal - 99% distal RCA. This is just distal to prior stent and appears chronic with left to right collaterals. The vessel is very small in this area and involves with PLOM and PDA bifurcation 2. Continued patency of stents in the proximal LAD and mid LCx. 3. Normal LV function with mildly elevated LVEDP 4. Successful stenting of the first diagonal with DES.     January 11, 2020 EKG Atrial flutter     Jun 20, 2018 EKG Atrial fibrillation     Recent Labs: 01/12/2020: Hemoglobin 16.5; Platelets 185 01/13/2020: ALT 29 01/14/2020: BUN 9; Creatinine, Ser 0.81; Magnesium 1.9; Potassium 3.8; Sodium 135 02/11/2020: BNP 25.3; TSH 1.040  Recent Lipid Panel Labs (Brief)          Component Value Date/Time   CHOL 149 02/06/2016 0749   TRIG 99 02/06/2016 0749   HDL 43 02/06/2016 0749   CHOLHDL 3.5 02/06/2016 0749   VLDL 20 02/06/2016 0749   LDLCALC 86 02/06/2016 0749      Physical Exam:    VS:  BP 128/72   Pulse 60   Ht 5\' 11"  (1.803 m)   Wt 258 lb (117 kg)   SpO2 97%   BMI 35.98 kg/m        Wt Readings from Last 3 Encounters:  03/10/20 258 lb (117 kg)  02/11/20 260 lb (117.9 kg)  01/14/20 254 lb 6.4 oz (115.4 kg)     GEN:  Well nourished, well developed in no acute distress HEENT: Normal NECK:  No JVD; No carotid bruits LYMPHATICS: No lymphadenopathy CARDIAC: RRR, no murmurs, rubs, gallops RESPIRATORY:  Clear to auscultation without rales, wheezing or rhonchi  ABDOMEN: Soft, non-tender, non-distended MUSCULOSKELETAL:  No edema; No deformity  SKIN: Warm and dry NEUROLOGIC:  Alert and oriented x 3 PSYCHIATRIC:  Normal affect   ASSESSMENT:    1. Persistent atrial fibrillation (HCC)   2. H/O intracranial hemorrhage   3. Arteriovenous malformation of cerebral vessels   4. Coronary artery disease involving native coronary artery of native heart without angina pectoris    PLAN:    In order of problems listed above:  1. Persistent atrial fibrillation and flutter Symptomatic.  CHA2DS2-VASc of at least 2 for hypertension and coronary/aortic disease.  Has a history of AVMs and intracranial hemorrhage which precludes use of chronic anticoagulation.  He has tolerated short courses of anticoagulation in the past surrounding the times of cardioversion.  Patient would like to pursue long-term prophylaxis against stroke without the use of long-term anticoagulants.  We discussed the watchman implant during today's appointment at length.  His wife was present for our discussion.  I think he is an excellent candidate for the watchman device.  As a part of the treatment plan for his atrial fibrillation and flutter which is required prior cardioversions, we discussed addressing his atrial arrhythmias first before pursuing watchman implant.  We discussed the atrial fibrillation ablation procedure in detail during today's visit including the risks, expected recovery time and expected efficacy.  They would like to proceed with a staged PVI/CTI followed 6 to 8 weeks later by the watchman implant.  He  would require 3 weeks of anticoagulation prior to the ablation.  After the ablation he would continue 5 mg of Eliquis twice daily until the time of watchman implant.  At the time of watchman implant he would  decrease his dose of Eliquis to 2.5 mg twice daily in addition to an 81 mg aspirin daily.  I have seen Kathyrn Drown Dombrosky in the office today who is being considered for a Watchman left atrial appendage closure device. I believe they will benefit from this procedure given their history of atrial fibrillation, CHA2DS2-VASc score of 2 and unadjusted ischemic stroke rate of 2.2% per year. Unfortunately, the patient is not felt to be a long term anticoagulation candidate secondary to a history of intracranial hemorrhage. The patient's chart has been reviewed and I feel that they would be a candidate for short term oral anticoagulation after Watchman implant.   It is my belief that after undergoing a LAA closure procedure, Peter Barnett will not need long term anticoagulation which eliminates anticoagulation side effects and major bleeding risk.   Procedural risks for the Watchman implant have been reviewed with the patient including a 0.5% risk of stroke, <1% risk of perforation and <1% risk of device embolization.    The published clinical data on the safety and effectiveness of WATCHMAN include but are not limited to the following: - Holmes DR, Mechele Claude, Sick P et al. for the PROTECT AF Investigators. Percutaneous closure of the left atrial appendage versus warfarin therapy for prevention of stroke in patients with atrial fibrillation: a randomised non-inferiority trial. Lancet 2009; 374: 534-42. Mechele Claude, Doshi SK, Abelardo Diesel D et al. on behalf of the PROTECT AF Investigators. Percutaneous Left Atrial Appendage Closure for Stroke Prophylaxis in Patients With Atrial Fibrillation 2.3-Year Follow-up of the PROTECT AF (Watchman Left Atrial Appendage System for Embolic Protection in Patients With Atrial Fibrillation) Trial. Circulation 2013; 127:720-729. - Alli O, Doshi S,  Kar S, Reddy VY, Sievert H et al. Quality of Life Assessment in the Randomized PROTECT AF (Percutaneous Closure of the Left  Atrial Appendage Versus Warfarin Therapy for Prevention of Stroke in Patients With Atrial Fibrillation) Trial of Patients at Risk for Stroke With Nonvalvular Atrial Fibrillation. J Am Coll Cardiol 2013; 80:9983-3. Vertell Limber DR, Tarri Abernethy, Price M, Kendrick, Sievert H, Doshi S, Huber K, Reddy V. Prospective randomized evaluation of the Watchman left atrial appendage Device in patients with atrial fibrillation versus long-term warfarin therapy; the PREVAIL trial. Journal of the [employer] of Cardiology, Vol. 4, No. 1, 2014, 1-11. - Kar S, Doshi SK, Sadhu A, Horton R, Osorio J et al. Primary outcome evaluation of a next-generation left atrial appendage closure device: results from the PINNACLE FLX trial. Circulation 2021;143(18)1754-1762.    After today's visit with the patient which was dedicated solely for shared decision making visit regarding LAA closure device, the patient decided to proceed with the LAA appendage closure procedure scheduled to be done in the near future at [facility]. Prior to the procedure, I would like to obtain a gated CT scan of the chest with contrast timed for PV/LA visualization.    2.  History of intracranial hemorrhage, AVMs Has tolerated short courses of anticoagulation in the past.  Given his history of intracranial hemorrhage, I believe he would be an excellent candidate for a watchman procedure with the goal of avoiding long-term anticoagulation.  3.  Coronary artery disease No ischemic symptoms during today's visit.   ----------------------------------------------------------------  I have seen, examined the patient, and reviewed the above assessment and plan.    Patient doing well after PVI+CTI ablation on 04/21/2020. Plan for LAAO [employer]) today. He has been taking his anticoagulant uninterrupted. Procedural risks for the Watchman implant have been reviewed with the patient including bleeding, vascular damage, death, a 0.5% risk of stroke,  <1% risk of perforation and <1% risk of device embolization.    [name], MD 06/19/2020 7:24 AM

## 2020-06-19 NOTE — Transfer of Care (Signed)
Immediate Anesthesia Transfer of Care Note  Patient: Peter Barnett  Procedure(s) Performed: LEFT ATRIAL APPENDAGE OCCLUSION (N/A ) TRANSESOPHAGEAL ECHOCARDIOGRAM (TEE) (N/A )  Patient Location: Cath Lab  Anesthesia Type:General  Level of Consciousness: drowsy and patient cooperative  Airway & Oxygen Therapy: Patient Spontanous Breathing and Patient connected to nasal cannula oxygen  Post-op Assessment: Report given to RN, Post -op Vital signs reviewed and stable and Patient moving all extremities  Post vital signs: Reviewed and stable  Last Vitals:  Vitals Value Taken Time  BP 134/85 06/19/20 0912  Temp 36.6 C 06/19/20 0908  Pulse 57 06/19/20 0915  Resp 11 06/19/20 0915  SpO2 98 % 06/19/20 0915  Vitals shown include unvalidated device data.  Last Pain:  Vitals:   06/19/20 0908  TempSrc: Temporal  PainSc: 0-No pain         Complications: No complications documented.

## 2020-06-19 NOTE — Anesthesia Procedure Notes (Signed)
Procedure Name: Intubation Date/Time: 06/19/2020 7:56 AM Performed by: Moshe Salisbury, CRNA Pre-anesthesia Checklist: Patient identified, Emergency Drugs available, Suction available and Patient being monitored Patient Re-evaluated:Patient Re-evaluated prior to induction Oxygen Delivery Method: Circle System Utilized Preoxygenation: Pre-oxygenation with 100% oxygen Induction Type: IV induction Ventilation: Mask ventilation with difficulty and Oral airway inserted - appropriate to patient size Laryngoscope Size: Mac and 4 Grade View: Grade III Tube type: Oral Tube size: 8.0 mm Number of attempts: 1 Airway Equipment and Method: Stylet and Oral airway Placement Confirmation: ETT inserted through vocal cords under direct vision,  positive ETCO2 and breath sounds checked- equal and bilateral Secured at: 22 cm Tube secured with: Tape Dental Injury: Teeth and Oropharynx as per pre-operative assessment

## 2020-06-19 NOTE — Plan of Care (Signed)
  Problem: Education: Goal: Knowledge of General Education information will improve Description: Including pain rating scale, medication(s)/side effects and non-pharmacologic comfort measures Outcome: Progressing   Problem: Health Behavior/Discharge Planning: Goal: Ability to manage health-related needs will improve Outcome: Progressing   Problem: Clinical Measurements: Goal: Ability to maintain clinical measurements within normal limits will improve Outcome: Progressing   Problem: Clinical Measurements: Goal: Diagnostic test results will improve Outcome: Progressing   Problem: Activity: Goal: Risk for activity intolerance will decrease Outcome: Progressing   Problem: Nutrition: Goal: Adequate nutrition will be maintained Outcome: Progressing   Problem: Elimination: Goal: Will not experience complications related to urinary retention Outcome: Progressing   Problem: Pain Managment: Goal: General experience of comfort will improve Outcome: Progressing   Problem: Skin Integrity: Goal: Risk for impaired skin integrity will decrease Outcome: Progressing

## 2020-06-19 NOTE — Anesthesia Preprocedure Evaluation (Signed)
Anesthesia Evaluation  Patient identified by MRN, date of birth, ID band Patient awake    Reviewed: Allergy & Precautions, H&P , NPO status , Patient's Chart, lab work & pertinent test results  Airway Mallampati: II   Neck ROM: full    Dental   Pulmonary COPD, former smoker,    breath sounds clear to auscultation       Cardiovascular hypertension, + CAD, + Past MI and + Cardiac Stents   Rhythm:regular Rate:Normal     Neuro/Psych  Headaches, Seizures -,   Neuromuscular disease CVA    GI/Hepatic GERD  ,  Endo/Other    Renal/GU      Musculoskeletal   Abdominal   Peds  Hematology   Anesthesia Other Findings   Reproductive/Obstetrics                             Anesthesia Physical Anesthesia Plan  ASA: III  Anesthesia Plan: General   Post-op Pain Management:    Induction: Intravenous  PONV Risk Score and Plan: 2  Airway Management Planned: Oral ETT  Additional Equipment: Arterial line  Intra-op Plan:   Post-operative Plan: Extubation in OR  Informed Consent: I have reviewed the patients History and Physical, chart, labs and discussed the procedure including the risks, benefits and alternatives for the proposed anesthesia with the patient or authorized representative who has indicated his/her understanding and acceptance.     Dental advisory given  Plan Discussed with: CRNA, Anesthesiologist and Surgeon  Anesthesia Plan Comments:         Anesthesia Quick Evaluation

## 2020-06-20 ENCOUNTER — Encounter (HOSPITAL_COMMUNITY): Payer: Self-pay | Admitting: Cardiology

## 2020-06-20 DIAGNOSIS — I2510 Atherosclerotic heart disease of native coronary artery without angina pectoris: Secondary | ICD-10-CM | POA: Diagnosis not present

## 2020-06-20 DIAGNOSIS — K219 Gastro-esophageal reflux disease without esophagitis: Secondary | ICD-10-CM | POA: Diagnosis not present

## 2020-06-20 DIAGNOSIS — G40802 Other epilepsy, not intractable, without status epilepticus: Secondary | ICD-10-CM | POA: Diagnosis not present

## 2020-06-20 DIAGNOSIS — Z20822 Contact with and (suspected) exposure to covid-19: Secondary | ICD-10-CM | POA: Diagnosis not present

## 2020-06-20 DIAGNOSIS — Z8673 Personal history of transient ischemic attack (TIA), and cerebral infarction without residual deficits: Secondary | ICD-10-CM | POA: Diagnosis not present

## 2020-06-20 DIAGNOSIS — I4892 Unspecified atrial flutter: Secondary | ICD-10-CM | POA: Diagnosis not present

## 2020-06-20 DIAGNOSIS — Z87891 Personal history of nicotine dependence: Secondary | ICD-10-CM | POA: Diagnosis not present

## 2020-06-20 DIAGNOSIS — I4819 Other persistent atrial fibrillation: Secondary | ICD-10-CM | POA: Diagnosis not present

## 2020-06-20 DIAGNOSIS — J449 Chronic obstructive pulmonary disease, unspecified: Secondary | ICD-10-CM | POA: Diagnosis not present

## 2020-06-20 DIAGNOSIS — I10 Essential (primary) hypertension: Secondary | ICD-10-CM | POA: Diagnosis not present

## 2020-06-20 LAB — BASIC METABOLIC PANEL
Anion gap: 5 (ref 5–15)
BUN: 11 mg/dL (ref 6–20)
CO2: 26 mmol/L (ref 22–32)
Calcium: 8.9 mg/dL (ref 8.9–10.3)
Chloride: 105 mmol/L (ref 98–111)
Creatinine, Ser: 0.87 mg/dL (ref 0.61–1.24)
GFR, Estimated: >60 mL/min (ref >60)
Glucose, Bld: 120 mg/dL — ABNORMAL HIGH (ref 70–99)
Potassium: 4.1 mmol/L (ref 3.5–5.1)
Sodium: 136 mmol/L (ref 135–145)

## 2020-06-20 MED ORDER — APIXABAN 2.5 MG PO TABS: 2.5000 mg | ORAL_TABLET | Freq: Two times a day (BID) | ORAL | 2 refills | Status: DC

## 2020-06-20 MED ORDER — ASPIRIN EC 81 MG PO TBEC: 81.0000 mg | DELAYED_RELEASE_TABLET | Freq: Every day | ORAL | Status: AC

## 2020-06-20 NOTE — Plan of Care (Signed)

## 2020-06-20 NOTE — Anesthesia Postprocedure Evaluation (Signed)
Anesthesia Post Note  Patient: Peter Barnett  Procedure(s) Performed: LEFT ATRIAL APPENDAGE OCCLUSION (N/A ) TRANSESOPHAGEAL ECHOCARDIOGRAM (TEE) (N/A )     Patient location during evaluation: Cath Lab Anesthesia Type: General Level of consciousness: awake and alert Pain management: pain level controlled Vital Signs Assessment: post-procedure vital signs reviewed and stable Respiratory status: spontaneous breathing, nonlabored ventilation, respiratory function stable and patient connected to nasal cannula oxygen Cardiovascular status: blood pressure returned to baseline and stable Postop Assessment: no apparent nausea or vomiting Anesthetic complications: no   No complications documented.  Last Vitals:  Vitals:   06/20/20 0300 06/20/20 0737  BP: 103/73 123/79  Pulse: 64 (!) 58  Resp: 14 14  Temp: 36.4 C 36.9 C  SpO2: 95% 95%    Last Pain:  Vitals:   06/20/20 0737  TempSrc: Oral  PainSc:                  [place]

## 2020-06-20 NOTE — Discharge Instructions (Signed)
[facility] Procedure, Care After  Procedure MD: Dr. Benson Norway Clinical Coordinator: Ambrose Pancoast, RN  This sheet gives you information about how to care for yourself after your procedure. Your health care provider may also give you more specific instructions. If you have problems or questions, contact your health care provider.  What can I expect after the procedure? After the procedure, it is common to have:  Bruising around your puncture site.  Tenderness around your puncture site.  Tiredness (fatigue).  Medication instructions . It is very important to continue to take your blood thinner as directed by your doctor after the Watchman procedure. Call your procedure doctor's office with question or concerns. . If you are on Coumadin (warfarin), you will have your INR checked the week after your procedure, with a goal INR of 2.0 - 3.0. Marland Kitchen Please follow your medication instructions on your discharge summary. Only take the medications listed on your discharge paperwork.  Follow up . You will be seen in 1 month after your procedure  . You will have another TEE (Transesophageal Echocardiogram) approximately 6 weeks after your procedure mark to check your device, scheduled for 07/31/20 . You will follow up the MD/APP who performed your procedure 6 months after your procedure . The Watchman Clinical Coordinator will check in with you from time to time, including 1 and 2 years after your procedure.    Follow these instructions at home: Puncture site care   Follow instructions from your health care provider about how to take care of your puncture site. Make sure you: ? If present, leave stitches (sutures), skin glue, or adhesive strips in place.  ? If a large square bandage is present, this may be removed 24 hours after surgery.   Check your puncture site every day for signs of infection. Check for: ? Redness, swelling, or pain. ? Fluid or blood. If your puncture site starts to  bleed, lie down on your back, apply firm pressure to the area, and contact your health care provider. ? Warmth. ? Pus or a bad smell. Driving  Do not drive yourself home if you received sedation  Do not drive for at least 4 days after your procedure or however long your health care provider recommends. (Do not resume driving if you have previously been instructed not to drive for other health reasons.)  Do not spend greater than 1 hour at a time in a car for the first 3 days. Stop and take a break with a 5 minute walk at least every hour.   Do not drive or use heavy machinery while taking prescription pain medicine.  Activity  Avoid activities that take a lot of effort, including exercise, for at least 7 days after your procedure.  For the first 3 days, avoid sitting for longer than one hour at a time.   Avoid alcoholic beverages, signing paperwork, or participating in legal proceedings for 24 hours after receiving sedation  Do not lift anything that is heavier than 10 lb (4.5 kg) for one week.   No sexual activity for 1 week.   Return to your normal activities as told by your health care provider. Ask your health care provider what activities are safe for you. General instructions  Take over-the-counter and prescription medicines only as told by your health care provider.  Do not use any products that contain nicotine or tobacco, such as cigarettes and e-cigarettes. If you need help quitting, ask your health care provider.  You may shower after  24 hours, but Do not take baths, swim, or use a hot tub for 1 week.   Do not drink alcohol for 24 hours after your procedure.  Keep all follow-up visits as told by your health care provider. This is important.  Dental Work: You will require antibiotics prior to any dental work, including cleanings, for 6 months after your Watchman implantation to help protect you from infection. After 6 months, antibiotics are no longer  required. Contact a health care provider if:  You have redness, mild swelling, or pain around your puncture site.  You have soreness in your throat or at your puncture site that does not improve after several days  You have fluid or blood coming from your puncture site that stops after applying firm pressure to the area.  Your puncture site feels warm to the touch.  You have pus or a bad smell coming from your puncture site.  You have a fever.  You have chest pain or discomfort that spreads to your neck, jaw, or arm.  You are sweating a lot.  You feel nauseous.  You have a fast or irregular heartbeat.  You have shortness of breath.  You are dizzy or light-headed and feel the need to lie down.  You have pain or numbness in the arm or leg closest to your puncture site. Get help right away if:  Your puncture site suddenly swells.  Your puncture site is bleeding and the bleeding does not stop after applying firm pressure to the area. These symptoms may represent a serious problem that is an emergency. Do not wait to see if the symptoms will go away. Get medical help right away. Call your local emergency services (911 in the [place] Do not drive yourself to the hospital. Summary  After the procedure, it is normal to have bruising and tenderness at the puncture site in your groin, neck, or forearm.  Check your puncture site every day for signs of infection.  Get help right away if your puncture site is bleeding and the bleeding does not stop after applying firm pressure to the area. This is a medical emergency.  This information is not intended to replace advice given to you by your health care provider. Make sure you discuss any questions you have with your health care provider.

## 2020-06-23 ENCOUNTER — Telehealth: Payer: Self-pay | Admitting: Nurse Practitioner

## 2020-06-23 NOTE — Telephone Encounter (Signed)
Watchman follow up call attempted with no answer. Detailed voicemail left on cell phon with instructions to return call when possible.  Ambrose Pancoast, RN Watchman Coordinator

## 2020-06-23 NOTE — Telephone Encounter (Signed)
   Post Watchman follow up call placed and returned by Mr.Palardy. Patient reports no complications or concern at this time. We reviewed [facility] medication regimen and also discussed upcoming 45 day TEE appointment. Patient agrees with treatment regimen and next steps in the Watchman process. Contact information provided and instructed to call me with any question before TEE appointment.    Ambrose Pancoast, RN Watchman Coordinator

## 2020-07-23 DIAGNOSIS — Z23 Encounter for immunization: Secondary | ICD-10-CM | POA: Diagnosis not present

## 2020-07-23 DIAGNOSIS — R042 Hemoptysis: Secondary | ICD-10-CM | POA: Diagnosis not present

## 2020-07-23 DIAGNOSIS — D6869 Other thrombophilia: Secondary | ICD-10-CM | POA: Diagnosis not present

## 2020-07-23 DIAGNOSIS — I10 Essential (primary) hypertension: Secondary | ICD-10-CM | POA: Diagnosis not present

## 2020-07-23 DIAGNOSIS — Z0000 Encounter for general adult medical examination without abnormal findings: Secondary | ICD-10-CM | POA: Diagnosis not present

## 2020-07-23 DIAGNOSIS — E1165 Type 2 diabetes mellitus with hyperglycemia: Secondary | ICD-10-CM | POA: Diagnosis not present

## 2020-07-23 DIAGNOSIS — E7800 Pure hypercholesterolemia, unspecified: Secondary | ICD-10-CM | POA: Diagnosis not present

## 2020-07-24 NOTE — H&P (View-Only) (Signed)
Electrophysiology Office Follow up Visit Note:    Date:  07/25/2020   ID:  Peter Barnett, DOB 30-Jun-1960, MRN [id number]  PCP:  [name], MD  [facility] Cardiologist:  Ellis Parents, MD  [facility] Electrophysiologist:  [name], MD    Interval History:    Peter Barnett is a 60 y.o. male who presents for a follow up visit.  Peter Barnett had an atrial fibrillation ablation on April 21, 2020 followed by a left atrial appendage closure procedure on Jun 19, 2020.  Both procedures were successful and he did well.  He has been maintained on Eliquis 2.5 mg twice daily in addition to aspirin 81 mg by mouth once daily.  He has been doing well.  He is with his wife who have previously met.  He tells me that he has not had any sustained arrhythmias since his ablation.  He has noted some swelling in his lower extremities that he is dealt with in the past.  The swelling in his legs is actually why he was transition to torsemide from Lasix.  The torsemide is very effective.  He does tell me that he had about a week of intermittent blood-tinged sputum.  This is since resolved.  No illness at that time.  He said that it presented with either pink sputum or darker tinged sputum at times.  Not particularly short of breath at those times.  He has a history of tobacco abuse and has an upcoming CT scan to screen for lung cancer to further investigate this hemoptysis.       Past Medical History:  Diagnosis Date   Atypical mole    CAD (coronary artery disease)    a. MI/DES to Cx 2009 with staged PCI of LAD with DES and distal RCA with BMS. b. NSTEMI 12/2015 s/p DES to D1, known occlusion of dRCA tx medically.   Chronic lower back pain    Still with recurrent left sided LBP with paresthesias down left leg --primarily with activities: pain pill use is avg <90 tabs per mo   COPD (chronic obstructive pulmonary disease) ([place])    NOTED on CT chest 11/2013 done for lung ca screening.   Hosp for acute exac 11/2014.  Spirometry not supportive of this dx as of pulm eval 2017, though.  Spireva trial by Dr. Elsworth Soho 02/2015.   GERD (gastroesophageal reflux disease)    Heart attack ([place]) 12/12/2015   History of adenomatous polyp of colon 02/2009;02/2014   Recall 5 yrs ([facility])   History of kidney stones    HTN (hypertension)    Hyperlipidemia    Intracranial hemorrhage ([place]) 12/27/14   small acute hemorrhage in gliotic medial R frontal lobe   Memory loss    MI (myocardial infarction) ([address]) 2009   "just had arm pain; never any chest pain"   Migraine syndrome 10/07/2011    a couple per month usually, but worsening 2017 so Dr. 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  Complex partial sz d/o.   Short-term memory loss 10/07/2011   "post brain OR & from his seizures"--worsening 2017, Dr. Krista Blue in neurology doing w/u.   Stroke [facility]) 12/2014   Tobacco abuse    Urethral stricture since 1981   Recurrent dilation has been required [facility] urology)    Past Surgical History:  Procedure Laterality Date   ATRIAL FIBRILLATION ABLATION N/A  04/21/2020   Procedure: ATRIAL FIBRILLATION ABLATION;  Surgeon: [name], MD;  Location: [place] CV LAB;  Service: Cardiovascular;  Laterality: N/A;   BACK SURGERY  11/25/2015   CARDIAC CATHETERIZATION  2011   [place], records unavailable: pt reports "normal".   CARDIAC CATHETERIZATION N/A 12/15/2015   Procedure: Left Heart Cath and Coronary Angiography;  Surgeon: Peter M Martinique, MD;  Location: [place] CV LAB;  Service: Cardiovascular;  Laterality: N/A;   CARDIAC CATHETERIZATION N/A 12/15/2015   Procedure: Coronary Stent Intervention;  Surgeon: Peter M Martinique, MD;  Location: [place] CV LAB;  Service: Cardiovascular;  Laterality: N/A;   CARDIOVERSION N/A 06/21/2018   Procedure: CARDIOVERSION;  Surgeon: Acie Fredrickson Wonda Cheng, MD;  Location: [facility] ENDOSCOPY;   Service: Cardiovascular;  Laterality: N/A;   CARDIOVERSION N/A 01/14/2020   Procedure: CARDIOVERSION;  Surgeon: Buford Dresser, MD;  Location: [facility] ENDOSCOPY;  Service: Cardiovascular;  Laterality: N/A;   COLONOSCOPY W/ POLYPECTOMY  02/2009;02/2014   Recall 5 yr (aden poly, hyperplast polyp, int and ext hemorr).  Next recall is 02/2019.   CORONARY ANGIOPLASTY WITH STENT PLACEMENT  2009   3 DES to LCx; still with known distal RCA/PDA occlusion.  EF 60% by LV-gram.   CRANIOTOMY FOR AVM  1981   Dx'd after pt began having seizures.   CT ANGIOGRAM (CEREBRAL)  12/31/14   NORMAL--plan per neuro is to repeat this in 3 mo.  Pt states it was repeated Spring 2017 and was "fine"   [place] N/A 03/27/2019   Procedure: CYSTOSCOPY WITH URETHRAL DILATATION;  Surgeon: Irine Seal, MD;  Location: [facility];  Service: Urology;  Laterality: N/A;   LEFT ATRIAL APPENDAGE OCCLUSION N/A 06/19/2020   Procedure: LEFT ATRIAL APPENDAGE OCCLUSION;  Surgeon: [name], MD;  Location: [place] [facility];  Service: Cardiovascular;  Laterality: N/A;   LUMBAR DISC SURGERY  2008; 2009   Dr. Joya Salm   LUMBAR LAMINECTOMY/DECOMPRESSION MICRODISCECTOMY N/A 11/25/2015   Procedure: Lumbar three- four Lumbar four- five Laminectomy/Foraminotomy;  Surgeon: Leeroy Cha, MD;  Location: [place];  Service: Neurosurgery;  Laterality: N/A;  L3-4 L4-5 Laminectomy/Foraminotomy/possible Lumbar Drain   TEE WITHOUT CARDIOVERSION N/A 06/21/2018   Procedure: TRANSESOPHAGEAL ECHOCARDIOGRAM (TEE);  Surgeon: Acie Fredrickson Wonda Cheng, MD;  Location: [place];  Service: Cardiovascular;  Laterality: N/A;   TEE WITHOUT CARDIOVERSION N/A 01/14/2020   Procedure: TRANSESOPHAGEAL ECHOCARDIOGRAM (TEE);  Surgeon: Buford Dresser, MD;  Location: [facility] ENDOSCOPY;  Service: Cardiovascular;  Laterality: N/A;   TEE WITHOUT CARDIOVERSION N/A 06/19/2020   Procedure: TRANSESOPHAGEAL ECHOCARDIOGRAM (TEE);  Surgeon: [name], MD;   Location: [place] CV LAB;  Service: Cardiovascular;  Laterality: N/A;   TONSILLECTOMY     "I was little"   TRANSTHORACIC ECHOCARDIOGRAM  12/07/14   Normal LV size/fxn, EF 60%, normal valves.   URETEROSCOPY WITH HOLMIUM LASER LITHOTRIPSY Left 03/27/2019   Procedure: cysto with dilation of uretheral sticture, ureteroscopy and left retrograde pyelogram.;  Surgeon: Irine Seal, MD;  Location: [facility];  Service: Urology;  Laterality: Left;    Current Medications: Current Meds  Medication Sig   acetaminophen (TYLENOL) 500 MG tablet Take 1 tablet (500 mg total) by mouth every 6 (six) hours as needed for headache.   albuterol (PROVENTIL HFA;VENTOLIN HFA) 108 (90 BASE) MCG/ACT inhaler Inhale 2 puffs into the lungs every 6 (six) hours as needed for wheezing or shortness of breath.   apixaban (ELIQUIS) 2.5 MG TABS tablet Take 1 tablet (2.5 mg total) by mouth 2 (two) times  daily.   aspirin EC 81 MG tablet Take 1 tablet (81 mg total) by mouth daily. Swallow whole.   Cholecalciferol (VITAMIN D3) 50 MCG (2000 UT) TABS Take 2,000 Units by mouth daily.   docusate sodium (COLACE) 100 MG capsule Take 100 mg by mouth 2 (two) times daily.    famotidine (PEPCID) 20 MG tablet Take 20 mg by mouth daily before breakfast.    HYDROcodone-acetaminophen (NORCO) 10-325 MG per tablet Take 1 tablet by mouth every 8 (eight) hours as needed (for pain).    metoprolol tartrate (LOPRESSOR) 25 MG tablet Take 1 tablet (25 mg total) by mouth 2 (two) times daily.   nitroGLYCERIN (NITROSTAT) 0.4 MG SL tablet Place 1 tablet (0.4 mg total) under the tongue every 5 (five) minutes as needed for chest pain (CP or SOB).   oxcarbazepine (TRILEPTAL) 600 MG tablet TAKE ONE TABLET BY MOUTH TWICE DAILY (Patient taking differently: Take 600 mg by mouth 2 (two) times daily.)   rosuvastatin (CRESTOR) 40 MG tablet Take 1 tablet (40 mg total) by mouth daily. (Patient taking differently: Take 40 mg by mouth every other day.)     Allergies:    Brilinta [ticagrelor], Tramadol, Atorvastatin, Ezetimibe, and Morphine and related   Social History   Socioeconomic History   Marital status: Married    Spouse name: [name]   Number of children: 2   Years of education: 10 th   Highest education level: Not on file  Occupational History   Occupation: retired     Fish farm manager: RETIRED    Comment: retired  Tobacco Use   Smoking status: Former    Packs/day: 3.00    Years: 27.00    Pack years: 81.00    Types: Cigarettes    Quit date: 02/08/2001    Years since quitting: 19.4   Smokeless tobacco: Never  Vaping Use   Vaping Use: Never used  Substance and Sexual Activity   Alcohol use: Yes    Alcohol/week: 7.0 standard drinks    Types: 7 Cans of beer per week    Comment: 12 oz beer    Drug use: No   Sexual activity: Yes  Other Topics Concern   Not on file  Social History Narrative   Patient lives at home with his wife [name]).   Two adult children.   Retired from [employer] approx age 59 due to medical reasons (disabled due to memory issues, seizure d/o and chronic LBP).   Education 10th grade.   Right handed.   Caffeine two cups of tea and two cups of coffee.   Tobacco 45 pack-yr hx, quit approx age 41.  Alcohol: 1-2 beers per night.     No hx of alc or drug problems.   No exercise.   Diet: regular   Social Determinants of Health   Financial Resource Strain: Not on file  Food Insecurity: Not on file  Transportation Needs: Not on file  Physical Activity: Not on file  Stress: Not on file  Social Connections: Not on file     Family History: The patient's family history includes Asthma in his father; Cancer in his father; Heart attack in his paternal uncle; Hyperlipidemia in his mother; Hypertension in his father. There is no history of Stroke.  ROS:   Please see the history of present illness.    All other systems reviewed and are negative.  EKGs/Labs/Other Studies Reviewed:    The following studies were reviewed  today: Procedure reports  EKG:  The ekg ordered  today demonstrates sinus rhythm.  Recent Labs: 01/13/2020: ALT 29 01/14/2020: Magnesium 1.9 02/11/2020: BNP 25.3; TSH 1.040 06/16/2020: Hemoglobin 16.7; Platelets 200 06/20/2020: BUN 11; Creatinine, Ser 0.87; Potassium 4.1; Sodium 136  Recent Lipid Panel    Component Value Date/Time   CHOL 149 02/06/2016 0749   TRIG 99 02/06/2016 0749   HDL 43 02/06/2016 0749   CHOLHDL 3.5 02/06/2016 0749   VLDL 20 02/06/2016 0749   LDLCALC 86 02/06/2016 0749    Physical Exam:    VS:  BP 118/70   Pulse (!) 52   Ht _0  (1.803 m)   Wt 252 lb (114.3 kg)   BMI 35.15 kg/m     Wt Readings from Last 3 Encounters:  07/25/20 252 lb (114.3 kg)  06/19/20 253 lb (114.8 kg)  05/22/20 253 lb (114.8 kg)     GEN:  Well nourished, well developed in no acute distress HEENT: Normal NECK: No JVD; No carotid bruits LYMPHATICS: No lymphadenopathy CARDIAC: RRR, no murmurs, rubs, gallops.  Trace to 1+ pitting edema up to the lower third of his shins bilaterally.  Warm extremities. RESPIRATORY:  Clear to auscultation without rales, wheezing or rhonchi  ABDOMEN: Soft, non-tender, non-distended MUSCULOSKELETAL:  No deformity  SKIN: Warm and dry NEUROLOGIC:  Alert and oriented x 3 PSYCHIATRIC:  Normal affect   ASSESSMENT:    1. Persistent atrial fibrillation ([place])   2. Chronic diastolic heart failure ([place])   3. HTN (hypertension), benign   4. Obesity (BMI 30-39.9)    PLAN:    In order of problems listed above:  Paroxysmal atrial fibrillation and flutter Maintaining sinus rhythm after his ablation.  He is pleased with the results.  EKG today shows sinus rhythm.  Patient has a watchman device for stroke prophylaxis given a history of intracranial hemorrhage.  Currently he is taking 2.5 mg of Eliquis twice daily in addition to aspirin 81 mg by mouth daily.  He has a transesophageal echo scheduled for June 23 to follow-up after his watchman implant.  If the  watchman device is well-seated without significant leakage, we will plan to discontinue the Eliquis at that time and continue aspirin.  We will also add Plavix 75 mg once a day.  That regimen will continue until the 81-monthmark after implant.  2.  Chronic diastolic heart failure  Slightly volume overloaded today.  NYHA class II.  I have asked him to take an extra dose of his torsemide for a couple of days if he is going to be at home all day to help diurese.  3.  Hypertension Controlled  4.  Obesity   Follow-up 9 months.  Will reach out to him after his transesophageal echo to discuss changes to his medications.     Medication Adjustments/Labs and Tests Ordered: Current medicines are reviewed at length with the patient today.  Concerns regarding medicines are outlined above.  No orders of the defined types were placed in this encounter.  No orders of the defined types were placed in this encounter.    Signed, CLars Mage MD, FBaptist [facility]6/17/2022 11:10 AM    Electrophysiology [facility]

## 2020-07-24 NOTE — Progress Notes (Signed)
Electrophysiology Office Follow up Visit Note:    Date:  07/25/2020   ID:  Peter Barnett, DOB 30-Jun-1960, MRN [id number]  PCP:  [name], MD  [facility] Cardiologist:  Ellis Parents, MD  [facility] Electrophysiologist:  [name], MD    Interval History:    Peter Barnett is a 60 y.o. male who presents for a follow up visit.  Peter Barnett had an atrial fibrillation ablation on April 21, 2020 followed by a left atrial appendage closure procedure on Jun 19, 2020.  Both procedures were successful and he did well.  He has been maintained on Eliquis 2.5 mg twice daily in addition to aspirin 81 mg by mouth once daily.  He has been doing well.  He is with his wife who have previously met.  He tells me that he has not had any sustained arrhythmias since his ablation.  He has noted some swelling in his lower extremities that he is dealt with in the past.  The swelling in his legs is actually why he was transition to torsemide from Lasix.  The torsemide is very effective.  He does tell me that he had about a week of intermittent blood-tinged sputum.  This is since resolved.  No illness at that time.  He said that it presented with either pink sputum or darker tinged sputum at times.  Not particularly short of breath at those times.  He has a history of tobacco abuse and has an upcoming CT scan to screen for lung cancer to further investigate this hemoptysis.       Past Medical History:  Diagnosis Date   Atypical mole    CAD (coronary artery disease)    a. MI/DES to Cx 2009 with staged PCI of LAD with DES and distal RCA with BMS. b. NSTEMI 12/2015 s/p DES to D1, known occlusion of dRCA tx medically.   Chronic lower back pain    Still with recurrent left sided LBP with paresthesias down left leg --primarily with activities: pain pill use is avg <90 tabs per mo   COPD (chronic obstructive pulmonary disease) ([place])    NOTED on CT chest 11/2013 done for lung ca screening.   Hosp for acute exac 11/2014.  Spirometry not supportive of this dx as of pulm eval 2017, though.  Spireva trial by Dr. Elsworth Soho 02/2015.   GERD (gastroesophageal reflux disease)    Heart attack ([place]) 12/12/2015   History of adenomatous polyp of colon 02/2009;02/2014   Recall 5 yrs ([facility])   History of kidney stones    HTN (hypertension)    Hyperlipidemia    Intracranial hemorrhage ([place]) 12/27/14   small acute hemorrhage in gliotic medial R frontal lobe   Memory loss    MI (myocardial infarction) ([address]) 2009   "just had arm pain; never any chest pain"   Migraine syndrome 10/07/2011    a couple per month usually, but worsening 2017 so Dr. 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  Complex partial sz d/o.   Short-term memory loss 10/07/2011   "post brain OR & from his seizures"--worsening 2017, Dr. Krista Blue in neurology doing w/u.   Stroke [facility]) 12/2014   Tobacco abuse    Urethral stricture since 1981   Recurrent dilation has been required [facility] urology)    Past Surgical History:  Procedure Laterality Date   ATRIAL FIBRILLATION ABLATION N/A  04/21/2020   Procedure: ATRIAL FIBRILLATION ABLATION;  Surgeon: [name], MD;  Location: [place] CV LAB;  Service: Cardiovascular;  Laterality: N/A;   BACK SURGERY  11/25/2015   CARDIAC CATHETERIZATION  2011   [place], records unavailable: pt reports "normal".   CARDIAC CATHETERIZATION N/A 12/15/2015   Procedure: Left Heart Cath and Coronary Angiography;  Surgeon: Peter M Martinique, MD;  Location: [place] CV LAB;  Service: Cardiovascular;  Laterality: N/A;   CARDIAC CATHETERIZATION N/A 12/15/2015   Procedure: Coronary Stent Intervention;  Surgeon: Peter M Martinique, MD;  Location: [place] CV LAB;  Service: Cardiovascular;  Laterality: N/A;   CARDIOVERSION N/A 06/21/2018   Procedure: CARDIOVERSION;  Surgeon: Acie Fredrickson Wonda Cheng, MD;  Location: [facility] ENDOSCOPY;   Service: Cardiovascular;  Laterality: N/A;   CARDIOVERSION N/A 01/14/2020   Procedure: CARDIOVERSION;  Surgeon: Buford Dresser, MD;  Location: [facility] ENDOSCOPY;  Service: Cardiovascular;  Laterality: N/A;   COLONOSCOPY W/ POLYPECTOMY  02/2009;02/2014   Recall 5 yr (aden poly, hyperplast polyp, int and ext hemorr).  Next recall is 02/2019.   CORONARY ANGIOPLASTY WITH STENT PLACEMENT  2009   3 DES to LCx; still with known distal RCA/PDA occlusion.  EF 60% by LV-gram.   CRANIOTOMY FOR AVM  1981   Dx'd after pt began having seizures.   CT ANGIOGRAM (CEREBRAL)  12/31/14   NORMAL--plan per neuro is to repeat this in 3 mo.  Pt states it was repeated Spring 2017 and was "fine"   [place] N/A 03/27/2019   Procedure: CYSTOSCOPY WITH URETHRAL DILATATION;  Surgeon: Irine Seal, MD;  Location: [facility];  Service: Urology;  Laterality: N/A;   LEFT ATRIAL APPENDAGE OCCLUSION N/A 06/19/2020   Procedure: LEFT ATRIAL APPENDAGE OCCLUSION;  Surgeon: [name], MD;  Location: [place] CV LAB;  Service: Cardiovascular;  Laterality: N/A;   LUMBAR DISC SURGERY  2008; 2009   Dr. Joya Salm   LUMBAR LAMINECTOMY/DECOMPRESSION MICRODISCECTOMY N/A 11/25/2015   Procedure: Lumbar three- four Lumbar four- five Laminectomy/Foraminotomy;  Surgeon: Leeroy Cha, MD;  Location: [place];  Service: Neurosurgery;  Laterality: N/A;  L3-4 L4-5 Laminectomy/Foraminotomy/possible Lumbar Drain   TEE WITHOUT CARDIOVERSION N/A 06/21/2018   Procedure: TRANSESOPHAGEAL ECHOCARDIOGRAM (TEE);  Surgeon: Acie Fredrickson Wonda Cheng, MD;  Location: [place];  Service: Cardiovascular;  Laterality: N/A;   TEE WITHOUT CARDIOVERSION N/A 01/14/2020   Procedure: TRANSESOPHAGEAL ECHOCARDIOGRAM (TEE);  Surgeon: Buford Dresser, MD;  Location: [facility] ENDOSCOPY;  Service: Cardiovascular;  Laterality: N/A;   TEE WITHOUT CARDIOVERSION N/A 06/19/2020   Procedure: TRANSESOPHAGEAL ECHOCARDIOGRAM (TEE);  Surgeon: [name], MD;   Location: [place] CV LAB;  Service: Cardiovascular;  Laterality: N/A;   TONSILLECTOMY     "I was little"   TRANSTHORACIC ECHOCARDIOGRAM  12/07/14   Normal LV size/fxn, EF 60%, normal valves.   URETEROSCOPY WITH HOLMIUM LASER LITHOTRIPSY Left 03/27/2019   Procedure: cysto with dilation of uretheral sticture, ureteroscopy and left retrograde pyelogram.;  Surgeon: Irine Seal, MD;  Location: [facility];  Service: Urology;  Laterality: Left;    Current Medications: Current Meds  Medication Sig   acetaminophen (TYLENOL) 500 MG tablet Take 1 tablet (500 mg total) by mouth every 6 (six) hours as needed for headache.   albuterol (PROVENTIL HFA;VENTOLIN HFA) 108 (90 BASE) MCG/ACT inhaler Inhale 2 puffs into the lungs every 6 (six) hours as needed for wheezing or shortness of breath.   apixaban (ELIQUIS) 2.5 MG TABS tablet Take 1 tablet (2.5 mg total) by mouth 2 (two) times  daily.   aspirin EC 81 MG tablet Take 1 tablet (81 mg total) by mouth daily. Swallow whole.   Cholecalciferol (VITAMIN D3) 50 MCG (2000 UT) TABS Take 2,000 Units by mouth daily.   docusate sodium (COLACE) 100 MG capsule Take 100 mg by mouth 2 (two) times daily.    famotidine (PEPCID) 20 MG tablet Take 20 mg by mouth daily before breakfast.    HYDROcodone-acetaminophen (NORCO) 10-325 MG per tablet Take 1 tablet by mouth every 8 (eight) hours as needed (for pain).    metoprolol tartrate (LOPRESSOR) 25 MG tablet Take 1 tablet (25 mg total) by mouth 2 (two) times daily.   nitroGLYCERIN (NITROSTAT) 0.4 MG SL tablet Place 1 tablet (0.4 mg total) under the tongue every 5 (five) minutes as needed for chest pain (CP or SOB).   oxcarbazepine (TRILEPTAL) 600 MG tablet TAKE ONE TABLET BY MOUTH TWICE DAILY (Patient taking differently: Take 600 mg by mouth 2 (two) times daily.)   rosuvastatin (CRESTOR) 40 MG tablet Take 1 tablet (40 mg total) by mouth daily. (Patient taking differently: Take 40 mg by mouth every other day.)     Allergies:    Brilinta [ticagrelor], Tramadol, Atorvastatin, Ezetimibe, and Morphine and related   Social History   Socioeconomic History   Marital status: Married    Spouse name: Peter Barnett   Number of children: 2   Years of education: 10 th   Highest education level: Not on file  Occupational History   Occupation: retired     Fish farm manager: RETIRED    Comment: retired  Tobacco Use   Smoking status: Former    Packs/day: 3.00    Years: 27.00    Pack years: 81.00    Types: Cigarettes    Quit date: 02/08/2001    Years since quitting: 19.4   Smokeless tobacco: Never  Vaping Use   Vaping Use: Never used  Substance and Sexual Activity   Alcohol use: Yes    Alcohol/week: 7.0 standard drinks    Types: 7 Cans of beer per week    Comment: 12 oz beer    Drug use: No   Sexual activity: Yes  Other Topics Concern   Not on file  Social History Narrative   Patient lives at home with his wife Peter Barnett).   Two adult children.   Retired from [employer] approx age 59 due to medical reasons (disabled due to memory issues, seizure d/o and chronic LBP).   Education 10th grade.   Right handed.   Caffeine two cups of tea and two cups of coffee.   Tobacco 45 pack-yr hx, quit approx age 41.  Alcohol: 1-2 beers per night.     No hx of alc or drug problems.   No exercise.   Diet: regular   Social Determinants of Health   Financial Resource Strain: Not on file  Food Insecurity: Not on file  Transportation Needs: Not on file  Physical Activity: Not on file  Stress: Not on file  Social Connections: Not on file     Family History: The patient's family history includes Asthma in his father; Cancer in his father; Heart attack in his paternal uncle; Hyperlipidemia in his mother; Hypertension in his father. There is no history of Stroke.  ROS:   Please see the history of present illness.    All other systems reviewed and are negative.  EKGs/Labs/Other Studies Reviewed:    The following studies were reviewed  today: Procedure reports  EKG:  The ekg ordered  today demonstrates sinus rhythm.  Recent Labs: 01/13/2020: ALT 29 01/14/2020: Magnesium 1.9 02/11/2020: BNP 25.3; TSH 1.040 06/16/2020: Hemoglobin 16.7; Platelets 200 06/20/2020: BUN 11; Creatinine, Ser 0.87; Potassium 4.1; Sodium 136  Recent Lipid Panel    Component Value Date/Time   CHOL 149 02/06/2016 0749   TRIG 99 02/06/2016 0749   HDL 43 02/06/2016 0749   CHOLHDL 3.5 02/06/2016 0749   VLDL 20 02/06/2016 0749   LDLCALC 86 02/06/2016 0749    Physical Exam:    VS:  BP 118/70   Pulse (!) 52   Ht _0  (1.803 m)   Wt 252 lb (114.3 kg)   BMI 35.15 kg/m     Wt Readings from Last 3 Encounters:  07/25/20 252 lb (114.3 kg)  06/19/20 253 lb (114.8 kg)  05/22/20 253 lb (114.8 kg)     GEN:  Well nourished, well developed in no acute distress HEENT: Normal NECK: No JVD; No carotid bruits LYMPHATICS: No lymphadenopathy CARDIAC: RRR, no murmurs, rubs, gallops.  Trace to 1+ pitting edema up to the lower third of his shins bilaterally.  Warm extremities. RESPIRATORY:  Clear to auscultation without rales, wheezing or rhonchi  ABDOMEN: Soft, non-tender, non-distended MUSCULOSKELETAL:  No deformity  SKIN: Warm and dry NEUROLOGIC:  Alert and oriented x 3 PSYCHIATRIC:  Normal affect   ASSESSMENT:    1. Persistent atrial fibrillation ([place])   2. Chronic diastolic heart failure ([place])   3. HTN (hypertension), benign   4. Obesity (BMI 30-39.9)    PLAN:    In order of problems listed above:  Paroxysmal atrial fibrillation and flutter Maintaining sinus rhythm after his ablation.  He is pleased with the results.  EKG today shows sinus rhythm.  Patient has a watchman device for stroke prophylaxis given a history of intracranial hemorrhage.  Currently he is taking 2.5 mg of Eliquis twice daily in addition to aspirin 81 mg by mouth daily.  He has a transesophageal echo scheduled for June 23 to follow-up after his watchman implant.  If the  watchman device is well-seated without significant leakage, we will plan to discontinue the Eliquis at that time and continue aspirin.  We will also add Plavix 75 mg once a day.  That regimen will continue until the 81-monthmark after implant.  2.  Chronic diastolic heart failure  Slightly volume overloaded today.  NYHA class II.  I have asked him to take an extra dose of his torsemide for a couple of days if he is going to be at home all day to help diurese.  3.  Hypertension Controlled  4.  Obesity   Follow-up 9 months.  Will reach out to him after his transesophageal echo to discuss changes to his medications.     Medication Adjustments/Labs and Tests Ordered: Current medicines are reviewed at length with the patient today.  Concerns regarding medicines are outlined above.  No orders of the defined types were placed in this encounter.  No orders of the defined types were placed in this encounter.    Signed, CLars Mage MD, FBapt[facility]6/17/2022 11:10 AM    Electrophysiology [facility]

## 2020-07-25 ENCOUNTER — Other Ambulatory Visit: Payer: Self-pay

## 2020-07-25 ENCOUNTER — Encounter: Payer: Self-pay | Admitting: *Deleted

## 2020-07-25 ENCOUNTER — Ambulatory Visit: Payer: Medicare HMO | Admitting: Cardiology

## 2020-07-25 VITALS — BP 118/70 | HR 52 | Ht 71.0 in | Wt 252.0 lb

## 2020-07-25 DIAGNOSIS — I2510 Atherosclerotic heart disease of native coronary artery without angina pectoris: Secondary | ICD-10-CM | POA: Diagnosis not present

## 2020-07-25 DIAGNOSIS — Z01812 Encounter for preprocedural laboratory examination: Secondary | ICD-10-CM | POA: Diagnosis not present

## 2020-07-25 DIAGNOSIS — I5032 Chronic diastolic (congestive) heart failure: Secondary | ICD-10-CM | POA: Diagnosis not present

## 2020-07-25 DIAGNOSIS — Z01818 Encounter for other preprocedural examination: Secondary | ICD-10-CM | POA: Diagnosis not present

## 2020-07-25 DIAGNOSIS — R042 Hemoptysis: Secondary | ICD-10-CM | POA: Diagnosis not present

## 2020-07-25 DIAGNOSIS — I4819 Other persistent atrial fibrillation: Secondary | ICD-10-CM | POA: Diagnosis not present

## 2020-07-25 DIAGNOSIS — R911 Solitary pulmonary nodule: Secondary | ICD-10-CM | POA: Diagnosis not present

## 2020-07-25 DIAGNOSIS — I700 Atherosclerosis of aorta: Secondary | ICD-10-CM | POA: Diagnosis not present

## 2020-07-25 DIAGNOSIS — I10 Essential (primary) hypertension: Secondary | ICD-10-CM

## 2020-07-25 DIAGNOSIS — E669 Obesity, unspecified: Secondary | ICD-10-CM | POA: Diagnosis not present

## 2020-07-25 DIAGNOSIS — Z95818 Presence of other cardiac implants and grafts: Secondary | ICD-10-CM

## 2020-07-25 DIAGNOSIS — J439 Emphysema, unspecified: Secondary | ICD-10-CM | POA: Diagnosis not present

## 2020-07-25 LAB — BASIC METABOLIC PANEL
BUN/Creatinine Ratio: 14 (ref 10–24)
BUN: 15 mg/dL (ref 8–27)
CO2: 19 mmol/L — ABNORMAL LOW (ref 20–29)
Calcium: 9.4 mg/dL (ref 8.6–10.2)
Chloride: 105 mmol/L (ref 96–106)
Creatinine, Ser: 1.04 mg/dL (ref 0.76–1.27)
Glucose: 100 mg/dL — ABNORMAL HIGH (ref 65–99)
Potassium: 4.3 mmol/L (ref 3.5–5.2)
Sodium: 141 mmol/L (ref 134–144)
eGFR: 82 mL/min/{1.73_m2} (ref >59)

## 2020-07-25 LAB — CBC
Hematocrit: 49.1 % (ref 37.5–51.0)
Hemoglobin: 15.9 g/dL (ref 13.0–17.7)
MCH: 28.6 pg (ref 26.6–33.0)
MCHC: 32.4 g/dL (ref 31.5–35.7)
MCV: 89 fL (ref 79–97)
Platelets: 228 10*3/uL (ref 150–450)
RBC: 5.55 x10E6/uL (ref 4.14–5.80)
RDW: 13.1 % (ref 11.6–15.4)
WBC: 8 10*3/uL (ref 3.4–10.8)

## 2020-07-25 NOTE — Patient Instructions (Addendum)
Medication Instructions:  No changes today *If you need a refill on your cardiac medications before your next appointment, please call your pharmacy*   Lab Work: BMET/CBC -pre procedure labs   If you have labs (blood work) drawn today and your tests are completely normal, you will receive your results only by: [place] (if you have MyChart) OR A paper copy in the mail If you have any lab test that is abnormal or we need to change your treatment, we will call you to review the results.   Testing/Procedures: TEE as scheduled.   Follow-Up: At [facility], you and your health needs are our priority.  As part of our continuing mission to provide you with exceptional heart care, we have created designated Provider Care Teams.  These Care Teams include your primary Cardiologist (physician) and Advanced Practice Providers (APPs -  Physician Assistants and Nurse Practitioners) who all work together to provide you with the care you need, when you need it.  We recommend signing up for the patient portal called "MyChart".  Sign up information is provided on this After Visit Summary.  MyChart is used to connect with patients for Virtual Visits (Telemedicine).  Patients are able to view lab/test results, encounter notes, upcoming appointments, etc.  Non-urgent messages can be sent to your provider as well.   To learn more about what you can do with MyChart, go to [email or web address].    Your next appointment:   9 month(s)  The format for your next appointment:   In Person  Provider:   Lars Mage, MD   Other Instructions Addendum: TEE instructions sent to patient via [place].   Rosa, RN 07/25/20 2:23 pm  TEE is scheduled for 07/31/20 at 7:30 am, please plan to arrive at 6:30 am to short stay at [facility]

## 2020-07-28 ENCOUNTER — Ambulatory Visit (HOSPITAL_COMMUNITY): Payer: Medicare HMO | Admitting: Physician Assistant

## 2020-07-30 NOTE — Anesthesia Preprocedure Evaluation (Addendum)
Anesthesia Evaluation  Patient identified by MRN, date of birth, ID band Patient awake    Reviewed: Allergy & Precautions, NPO status , Patient's Chart, lab work & pertinent test results  History of Anesthesia Complications Negative for: history of anesthetic complications  Airway Mallampati: II  TM Distance: >3 FB Neck ROM: Full    Dental   Pulmonary COPD, former smoker,    Pulmonary exam normal        Cardiovascular hypertension, Pt. on medications and Pt. on home beta blockers + CAD, + Past MI and + Cardiac Stents (2009, 2017)  Normal cardiovascular exam     Neuro/Psych Seizures -,  CVA (2016; h/o AVM w/ hemorrhage)    GI/Hepatic Neg liver ROS, GERD  ,  Endo/Other  negative endocrine ROS  Renal/GU negative Renal ROS  negative genitourinary   Musculoskeletal negative musculoskeletal ROS (+)   Abdominal   Peds  Hematology negative hematology ROS (+) Eliquis   Anesthesia Other Findings  Echo 06/19/20: EF 55-60%, normal RV function, mild AR, valves otherwise unremarkable  Plan for TEE to determine effectiveness of watchman procedure  Reproductive/Obstetrics                            Anesthesia Physical Anesthesia Plan  ASA: 3  Anesthesia Plan: MAC   Post-op Pain Management:    Induction: Intravenous  PONV Risk Score and Plan: 1 and Propofol infusion, TIVA and Treatment may vary due to age or medical condition  Airway Management Planned: Natural Airway, Nasal Cannula and Simple Face Mask  Additional Equipment: None  Intra-op Plan:   Post-operative Plan:   Informed Consent: I have reviewed the patients History and Physical, chart, labs and discussed the procedure including the risks, benefits and alternatives for the proposed anesthesia with the patient or authorized representative who has indicated his/her understanding and acceptance.       Plan Discussed with:    Anesthesia Plan Comments:        Anesthesia Quick Evaluation

## 2020-07-31 ENCOUNTER — Other Ambulatory Visit (HOSPITAL_COMMUNITY): Payer: Self-pay | Admitting: Nurse Practitioner

## 2020-07-31 ENCOUNTER — Encounter (HOSPITAL_COMMUNITY)
Admission: RE | Disposition: A | Discharge status: Home / Self Care | Payer: Self-pay | Source: Home / Self Care | Attending: Cardiovascular Disease

## 2020-07-31 ENCOUNTER — Ambulatory Visit (HOSPITAL_COMMUNITY): Payer: Medicare HMO | Admitting: Anesthesiology

## 2020-07-31 ENCOUNTER — Ambulatory Visit (HOSPITAL_COMMUNITY)
Admission: RE | Admit: 2020-07-31 | Discharge: 2020-07-31 | Disposition: A | Discharge status: Home / Self Care | Payer: Medicare HMO | Attending: Cardiovascular Disease | Admitting: Cardiovascular Disease

## 2020-07-31 ENCOUNTER — Encounter (HOSPITAL_COMMUNITY): Payer: Self-pay | Admitting: Cardiovascular Disease

## 2020-07-31 ENCOUNTER — Telehealth: Payer: Self-pay | Admitting: Nurse Practitioner

## 2020-07-31 ENCOUNTER — Ambulatory Visit (HOSPITAL_BASED_OUTPATIENT_CLINIC_OR_DEPARTMENT_OTHER)
Admission: RE | Admit: 2020-07-31 | Discharge: 2020-07-31 | Disposition: A | Discharge status: Home / Self Care | Payer: Medicare HMO | Source: Home / Self Care | Attending: Cardiology | Admitting: Cardiology

## 2020-07-31 DIAGNOSIS — I083 Combined rheumatic disorders of mitral, aortic and tricuspid valves: Secondary | ICD-10-CM | POA: Diagnosis not present

## 2020-07-31 DIAGNOSIS — I340 Nonrheumatic mitral (valve) insufficiency: Secondary | ICD-10-CM | POA: Diagnosis not present

## 2020-07-31 DIAGNOSIS — I5032 Chronic diastolic (congestive) heart failure: Secondary | ICD-10-CM | POA: Diagnosis not present

## 2020-07-31 DIAGNOSIS — Z87891 Personal history of nicotine dependence: Secondary | ICD-10-CM | POA: Insufficient documentation

## 2020-07-31 DIAGNOSIS — Z79899 Other long term (current) drug therapy: Secondary | ICD-10-CM | POA: Diagnosis not present

## 2020-07-31 DIAGNOSIS — I361 Nonrheumatic tricuspid (valve) insufficiency: Secondary | ICD-10-CM

## 2020-07-31 DIAGNOSIS — Z7901 Long term (current) use of anticoagulants: Secondary | ICD-10-CM | POA: Diagnosis not present

## 2020-07-31 DIAGNOSIS — J441 Chronic obstructive pulmonary disease with (acute) exacerbation: Secondary | ICD-10-CM | POA: Diagnosis not present

## 2020-07-31 DIAGNOSIS — Z885 Allergy status to narcotic agent status: Secondary | ICD-10-CM | POA: Insufficient documentation

## 2020-07-31 DIAGNOSIS — E669 Obesity, unspecified: Secondary | ICD-10-CM | POA: Diagnosis not present

## 2020-07-31 DIAGNOSIS — I10 Essential (primary) hypertension: Secondary | ICD-10-CM | POA: Diagnosis not present

## 2020-07-31 DIAGNOSIS — I4892 Unspecified atrial flutter: Secondary | ICD-10-CM | POA: Insufficient documentation

## 2020-07-31 DIAGNOSIS — Z7982 Long term (current) use of aspirin: Secondary | ICD-10-CM | POA: Insufficient documentation

## 2020-07-31 DIAGNOSIS — I2510 Atherosclerotic heart disease of native coronary artery without angina pectoris: Secondary | ICD-10-CM | POA: Diagnosis not present

## 2020-07-31 DIAGNOSIS — Z6835 Body mass index (BMI) 35.0-35.9, adult: Secondary | ICD-10-CM | POA: Insufficient documentation

## 2020-07-31 DIAGNOSIS — I4819 Other persistent atrial fibrillation: Secondary | ICD-10-CM | POA: Insufficient documentation

## 2020-07-31 DIAGNOSIS — I110 Hypertensive heart disease with heart failure: Secondary | ICD-10-CM | POA: Diagnosis not present

## 2020-07-31 DIAGNOSIS — I351 Nonrheumatic aortic (valve) insufficiency: Secondary | ICD-10-CM

## 2020-07-31 DIAGNOSIS — I4891 Unspecified atrial fibrillation: Secondary | ICD-10-CM

## 2020-07-31 DIAGNOSIS — Z888 Allergy status to other drugs, medicaments and biological substances status: Secondary | ICD-10-CM | POA: Diagnosis not present

## 2020-07-31 DIAGNOSIS — I080 Rheumatic disorders of both mitral and aortic valves: Secondary | ICD-10-CM | POA: Diagnosis not present

## 2020-07-31 HISTORY — PX: TEE WITHOUT CARDIOVERSION: SHX5443

## 2020-07-31 SURGERY — ECHOCARDIOGRAM, TRANSESOPHAGEAL
Anesthesia: Monitor Anesthesia Care

## 2020-07-31 MED ORDER — PROPOFOL 500 MG/50ML IV EMUL
INTRAVENOUS | Status: DC | PRN
  Administered 2020-07-31: 100 ug/kg/min via INTRAVENOUS

## 2020-07-31 MED ORDER — CLOPIDOGREL BISULFATE 75 MG PO TABS: 75.0000 mg | ORAL_TABLET | Freq: Every day | ORAL | 1 refills | Status: DC

## 2020-07-31 MED ORDER — SODIUM CHLORIDE 0.9 % IV SOLN: INTRAVENOUS | Status: DC

## 2020-07-31 NOTE — Telephone Encounter (Addendum)
Mr. Slowey contacted and advised to stop Eliquis effective 6/24 and to begin Plavix 75 mg PO QD per Dr. Quentin Ore and  Watchman Protocol. He was advised to continue his ASA 81 mg. Patient expressed a full understanding of treatment plan and advised to contact me or the office with any question or concerns.  [place] Team

## 2020-07-31 NOTE — Anesthesia Postprocedure Evaluation (Signed)
Anesthesia Post Note  Patient: Peter Barnett  Procedure(s) Performed: TRANSESOPHAGEAL ECHOCARDIOGRAM (TEE)     Patient location during evaluation: Endoscopy Anesthesia Type: MAC Level of consciousness: awake and alert Pain management: pain level controlled Vital Signs Assessment: post-procedure vital signs reviewed and stable Respiratory status: spontaneous breathing, nonlabored ventilation and respiratory function stable Cardiovascular status: blood pressure returned to baseline and stable Postop Assessment: no apparent nausea or vomiting Anesthetic complications: no   No notable events documented.  Last Vitals:  Vitals:   07/31/20 0820 07/31/20 0830  BP: 119/77   Pulse: (!) 58 (!) 55  Resp: 15 17  Temp:    SpO2: 95% 97%    Last Pain:  Vitals:   07/31/20 0830  TempSrc:   PainSc: 0-No pain                 Lidia Collum

## 2020-07-31 NOTE — Progress Notes (Signed)
  Echocardiogram Echocardiogram Transesophageal with 3D and color has been performed.  Darlina Sicilian M 07/31/2020, 8:40 AM

## 2020-07-31 NOTE — Interval H&P Note (Signed)
History and Physical Interval Note:  07/31/2020 7:12 AM  Peter Barnett  has presented today for surgery, with the diagnosis of POST WATCHMAN IMPLANT.  The various methods of treatment have been discussed with the patient and family. After consideration of risks, benefits and other options for treatment, the patient has consented to  Procedure(s): TRANSESOPHAGEAL ECHOCARDIOGRAM (TEE) (N/A) as a surgical intervention.  The patient's history has been reviewed, patient examined, no change in status, stable for surgery.  I have reviewed the patient's chart and labs.  Questions were answered to the patient's satisfaction.     Jenkins Rouge

## 2020-07-31 NOTE — Discharge Instructions (Signed)

## 2020-07-31 NOTE — Anesthesia Procedure Notes (Signed)
Procedure Name: MAC Date/Time: 07/31/2020 7:40 AM Performed by: Griffin Dakin, CRNA Pre-anesthesia Checklist: Patient identified, Emergency Drugs available, Suction available, Patient being monitored and Timeout performed Patient Re-evaluated:Patient Re-evaluated prior to induction Oxygen Delivery Method: Nasal cannula Induction Type: IV induction Airway Equipment and Method: Patient positioned with wedge pillow Placement Confirmation: positive ETCO2 and breath sounds checked- equal and bilateral Dental Injury: Teeth and Oropharynx as per pre-operative assessment

## 2020-07-31 NOTE — Transfer of Care (Signed)
Immediate Anesthesia Transfer of Care Note  Patient: Peter Barnett  Procedure(s) Performed: TRANSESOPHAGEAL ECHOCARDIOGRAM (TEE)  Patient Location: PACU and Endoscopy Unit  Anesthesia Type:MAC  Level of Consciousness: awake and drowsy  Airway & Oxygen Therapy: Patient Spontanous Breathing and Patient connected to nasal cannula oxygen  Post-op Assessment: Report given to RN and Post -op Vital signs reviewed and stable  Post vital signs: Reviewed and stable  Last Vitals:  Vitals Value Taken Time  BP 120/70 07/31/20 0802  Temp    Pulse 69 07/31/20 0803  Resp 23 07/31/20 0803  SpO2 97 % 07/31/20 0803  Vitals shown include unvalidated device data.  Last Pain:  Vitals:   07/31/20 0700  TempSrc: Temporal  PainSc: 0-No pain         Complications: No notable events documented.

## 2020-07-31 NOTE — CV Procedure (Signed)
TEE: Anesthesia: Propofol  EF 55-60% Well positioned 20 mm Watchman FLX device with no leak Compression near lower limit of 10%  Mild MR Mild AR Trans septal puncture site closed No effusion  Extensive 3D imaging used of LAA/Atrial septum  Jenkins Rouge MD [facility]

## 2020-07-31 NOTE — Telephone Encounter (Signed)
Update:  This was per Dr Johnsie Cancel.  From: Josue Hector, MD  Sent: 07/31/2020   8:07 AM EDT  To: Eileen Stanford, PA-C, *   Device looks good should be able to change to plavix

## 2020-08-04 ENCOUNTER — Other Ambulatory Visit: Payer: Self-pay | Admitting: Cardiovascular Disease

## 2020-08-13 MED ORDER — TORSEMIDE 20 MG PO TABS: 20.0000 mg | ORAL_TABLET | Freq: Every day | ORAL | 3 refills | Status: DC

## 2020-08-21 DIAGNOSIS — R0683 Snoring: Secondary | ICD-10-CM | POA: Diagnosis not present

## 2020-08-21 DIAGNOSIS — Z87891 Personal history of nicotine dependence: Secondary | ICD-10-CM | POA: Diagnosis not present

## 2020-08-21 DIAGNOSIS — I10 Essential (primary) hypertension: Secondary | ICD-10-CM | POA: Diagnosis not present

## 2020-08-21 DIAGNOSIS — R0600 Dyspnea, unspecified: Secondary | ICD-10-CM | POA: Diagnosis not present

## 2020-08-21 DIAGNOSIS — G4719 Other hypersomnia: Secondary | ICD-10-CM | POA: Diagnosis not present

## 2020-08-21 DIAGNOSIS — J439 Emphysema, unspecified: Secondary | ICD-10-CM | POA: Diagnosis not present

## 2020-10-31 ENCOUNTER — Encounter: Payer: Self-pay | Admitting: Podiatry

## 2020-10-31 ENCOUNTER — Other Ambulatory Visit: Payer: Self-pay

## 2020-10-31 ENCOUNTER — Ambulatory Visit: Payer: Medicare HMO | Admitting: Podiatry

## 2020-10-31 DIAGNOSIS — L600 Ingrowing nail: Secondary | ICD-10-CM

## 2020-10-31 DIAGNOSIS — M79675 Pain in left toe(s): Secondary | ICD-10-CM

## 2020-10-31 MED ORDER — CEPHALEXIN 500 MG PO CAPS: 500.0000 mg | ORAL_CAPSULE | Freq: Three times a day (TID) | ORAL | 0 refills | Status: DC

## 2020-10-31 NOTE — Patient Instructions (Signed)

## 2020-10-31 NOTE — Progress Notes (Signed)
Subjective:   Patient ID: Peter Barnett, male   DOB: 60 y.o.   MRN: [id number]   HPI 60 year old male presents the office today with his wife for concerns of pain to his left big toe which is been ongoing for the last 1 to 2 weeks.  He states that it swollen and red on the corners.  No drainage or pus.  Points on the medial hallux nail border where he gets discomfort.  No recent treatment.  He has had chronic swelling to his feet many years.   Review of Systems  All other systems reviewed and are negative.  Past Medical History:  Diagnosis Date   Atypical mole    CAD (coronary artery disease)    a. MI/DES to Cx 2009 with staged PCI of LAD with DES and distal RCA with BMS. b. NSTEMI 12/2015 s/p DES to D1, known occlusion of dRCA tx medically.   Chronic lower back pain    Still with recurrent left sided LBP with paresthesias down left leg --primarily with activities: pain pill use is avg <90 tabs per mo   COPD (chronic obstructive pulmonary disease) ([place])    NOTED on CT chest 11/2013 done for lung ca screening.  Hosp for acute exac 11/2014.  Spirometry not supportive of this dx as of pulm eval 2017, though.  Spireva trial by Dr. Elsworth Soho 02/2015.   GERD (gastroesophageal reflux disease)    Heart attack ([place]) 12/12/2015   History of adenomatous polyp of colon 02/2009;02/2014   Recall 5 yrs ([facility])   History of kidney stones    HTN (hypertension)    Hyperlipidemia    Intracranial hemorrhage ([place]) 12/27/14   small acute hemorrhage in gliotic medial R frontal lobe   Memory loss    MI (myocardial infarction) ([place]) 2009   "just had arm pain; never any chest pain"   Migraine syndrome 10/07/2011    a couple per month usually, but worsening 2017 so Dr. 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  Complex partial sz d/o.   Short-term memory loss 10/07/2011   "post brain OR & from his seizures"--worsening 2017, Dr.  Krista Blue in neurology doing w/u.   Stroke [facility]) 12/2014   Tobacco abuse    Urethral stricture since 1981   Recurrent dilation has been required ([facility])    Past Surgical History:  Procedure Laterality Date   ATRIAL FIBRILLATION ABLATION N/A 04/21/2020   Procedure: ATRIAL FIBRILLATION ABLATION;  Surgeon: [name], MD;  Location: [place] [facility];  Service: Cardiovascular;  Laterality: N/A;   BACK SURGERY  11/25/2015   CARDIAC CATHETERIZATION  2011   [place], records unavailable: pt reports "normal".   CARDIAC CATHETERIZATION N/A 12/15/2015   Procedure: Left Heart Cath and Coronary Angiography;  Surgeon: Peter M Martinique, MD;  Location: [place] [facility];  Service: Cardiovascular;  Laterality: N/A;   CARDIAC CATHETERIZATION N/A 12/15/2015   Procedure: Coronary Stent Intervention;  Surgeon: Peter M Martinique, MD;  Location: [place] [facility];  Service: Cardiovascular;  Laterality: N/A;   CARDIOVERSION N/A 06/21/2018   Procedure: CARDIOVERSION;  Surgeon: Acie Fredrickson Wonda Cheng, MD;  Location: [facility] ENDOSCOPY;  Service: Cardiovascular;  Laterality: N/A;   CARDIOVERSION N/A 01/14/2020   Procedure: CARDIOVERSION;  Surgeon: Buford Dresser, MD;  Location: [place];  Service: Cardiovascular;  Laterality: N/A;   COLONOSCOPY W/ POLYPECTOMY  02/2009;02/2014   Recall 5 yr (aden  poly, hyperplast polyp, int and ext hemorr).  Next recall is 02/2019.   CORONARY ANGIOPLASTY WITH STENT PLACEMENT  2009   3 DES to LCx; still with known distal RCA/PDA occlusion.  EF 60% by LV-gram.   CRANIOTOMY FOR AVM  1981   Dx'd after pt began having seizures.   CT ANGIOGRAM (CEREBRAL)  12/31/14   NORMAL--plan per neuro is to repeat this in 3 mo.  Pt states it was repeated Spring 2017 and was "fine"   [place] N/A 03/27/2019   Procedure: CYSTOSCOPY WITH URETHRAL DILATATION;  Surgeon: Irine Seal, MD;  Location: [facility];  Service: Urology;  Laterality: N/A;   LEFT ATRIAL APPENDAGE  OCCLUSION N/A 06/19/2020   Procedure: LEFT ATRIAL APPENDAGE OCCLUSION;  Surgeon: [name], MD;  Location: [place] [facility];  Service: Cardiovascular;  Laterality: N/A;   LUMBAR DISC SURGERY  2008; 2009   Dr. Joya Salm   LUMBAR LAMINECTOMY/DECOMPRESSION MICRODISCECTOMY N/A 11/25/2015   Procedure: Lumbar three- four Lumbar four- five Laminectomy/Foraminotomy;  Surgeon: Leeroy Cha, MD;  Location: [place];  Service: Neurosurgery;  Laterality: N/A;  L3-4 L4-5 Laminectomy/Foraminotomy/possible Lumbar Drain   TEE WITHOUT CARDIOVERSION N/A 06/21/2018   Procedure: TRANSESOPHAGEAL ECHOCARDIOGRAM (TEE);  Surgeon: Acie Fredrickson Wonda Cheng, MD;  Location: [place];  Service: Cardiovascular;  Laterality: N/A;   TEE WITHOUT CARDIOVERSION N/A 01/14/2020   Procedure: TRANSESOPHAGEAL ECHOCARDIOGRAM (TEE);  Surgeon: Buford Dresser, MD;  Location: [facility] ENDOSCOPY;  Service: Cardiovascular;  Laterality: N/A;   TEE WITHOUT CARDIOVERSION N/A 06/19/2020   Procedure: TRANSESOPHAGEAL ECHOCARDIOGRAM (TEE);  Surgeon: [name], MD;  Location: [place] [facility];  Service: Cardiovascular;  Laterality: N/A;   TEE WITHOUT CARDIOVERSION N/A 07/31/2020   Procedure: TRANSESOPHAGEAL ECHOCARDIOGRAM (TEE);  Surgeon: Josue Hector, MD;  Location: [facility] ENDOSCOPY;  Service: Cardiovascular;  Laterality: N/A;   TONSILLECTOMY     "I was little"   TRANSTHORACIC ECHOCARDIOGRAM  12/07/14   Normal LV size/fxn, EF 60%, normal valves.   URETEROSCOPY WITH HOLMIUM LASER LITHOTRIPSY Left 03/27/2019   Procedure: cysto with dilation of uretheral sticture, ureteroscopy and left retrograde pyelogram.;  Surgeon: Irine Seal, MD;  Location: [facility];  Service: Urology;  Laterality: Left;     Current Outpatient Medications:    acetaminophen (TYLENOL) 500 MG tablet, Take 1 tablet (500 mg total) by mouth every 6 (six) hours as needed for headache., Disp: 30 tablet, Rfl: 0   albuterol (PROVENTIL HFA;VENTOLIN HFA) 108 (90 BASE) MCG/ACT  inhaler, Inhale 2 puffs into the lungs every 6 (six) hours as needed for wheezing or shortness of breath., Disp: 1 Inhaler, Rfl: 2   aspirin EC 81 MG tablet, Take 1 tablet (81 mg total) by mouth daily. Swallow whole., Disp: , Rfl:    Cholecalciferol (VITAMIN D3) 50 MCG (2000 UT) TABS, Take 2,000 Units by mouth daily., Disp: , Rfl:    clopidogrel (PLAVIX) 75 MG tablet, Take 1 tablet (75 mg total) by mouth daily., Disp: 90 tablet, Rfl: 1   docusate sodium (COLACE) 100 MG capsule, Take 100 mg by mouth 2 (two) times daily. , Disp: , Rfl:    ELIQUIS 5 MG TABS tablet, Take 5 mg by mouth 2 (two) times daily., Disp: , Rfl:    famotidine (PEPCID) 20 MG tablet, Take 20 mg by mouth daily before breakfast. , Disp: , Rfl:    HYDROcodone-acetaminophen (NORCO) 10-325 MG per tablet, Take 1 tablet by mouth every 8 (eight) hours as needed (for pain). , Disp: , Rfl:  KLOR-CON M20 20 MEQ tablet, Take 20 mEq by mouth daily., Disp: , Rfl:    metoprolol tartrate (LOPRESSOR) 25 MG tablet, Take 1 tablet (25 mg total) by mouth 2 (two) times daily., Disp: 180 tablet, Rfl: 1   MODERNA COVID-19 VACCINE 100 MCG/0.5ML injection, , Disp: , Rfl:    nitroGLYCERIN (NITROSTAT) 0.4 MG SL tablet, Place 1 tablet (0.4 mg total) under the tongue every 5 (five) minutes as needed for chest pain (CP or SOB)., Disp: 25 tablet, Rfl: 3   oxcarbazepine (TRILEPTAL) 600 MG tablet, TAKE ONE TABLET BY MOUTH TWICE DAILY (Patient taking differently: Take 600 mg by mouth 2 (two) times daily.), Disp: 180 tablet, Rfl: 0   rosuvastatin (CRESTOR) 40 MG tablet, Take 1 tablet (40 mg total) by mouth daily. (Patient taking differently: Take 40 mg by mouth every other day.), Disp: 15 tablet, Rfl: 0   torsemide (DEMADEX) 20 MG tablet, Take 1 tablet (20 mg total) by mouth daily., Disp: 90 tablet, Rfl: 3  Allergies  Allergen Reactions   Brilinta [Ticagrelor] Other (See Comments)    HISTORY OF A BRAIN BLEED- was told by an MD to not take    Tramadol Other (See  Comments)    SEIZURES and "Staring off spells"   Atorvastatin Other (See Comments)    MYALGIAS   Ezetimibe Other (See Comments)    Muscle pain   Morphine And Related Nausea And Vomiting          Objective:  Physical Exam  General: AAO x3, NAD  Dermatological: Incurvation present to the left medial hallux nail border with localized edema and erythema.  There is no drainage or pus.  No fluctuation or crepitation.  No open lesions.  Vascular: Dorsalis Pedis artery and Posterior Tibial artery pedal pulses are palpable bilateral with immedate capillary fill time. Pedal hair growth present.  There is no pain with calf compression, swelling, warmth, erythema.   Neruologic: Grossly intact via light touch bilateral.    Musculoskeletal: There is to the left medial hallux nail border.  No other areas of discomfort.  Muscular strength 5/5 in all groups tested bilateral.  Gait: Unassisted, Nonantalgic.       Assessment:   Ingrown toenail left medial hallux     Plan:  -Treatment options discussed including all alternatives, risks, and complications -Etiology of symptoms were discussed -At this time, the patient is requesting partial nail removal with chemical matricectomy to the symptomatic portion of the nail. Risks and complications were discussed with the patient for which they understand and written consent was obtained. Under sterile conditions a total of 3 mL of a mixture of 2% lidocaine plain and 0.5% Marcaine plain was infiltrated in a hallux block fashion. Once anesthetized, the skin was prepped in sterile fashion. A tourniquet was then applied. Next the medial aspect of hallux nail border was then sharply excised making sure to remove the entire offending nail border. Once the nails were ensured to be removed area was debrided and the underlying skin was intact. There is no purulence identified in the procedure. Next phenol was then applied under standard conditions and copiously  irrigated. Silvadene was applied. A dry sterile dressing was applied. After application of the dressing the tourniquet was removed and there is found to be an immediate capillary refill time to the digit. The patient tolerated the procedure well any complications. Post procedure instructions were discussed the patient for which he verbally understood. Follow-up in one week for nail check or sooner if  any problems are to arise. Discussed signs/symptoms of infection and directed to call the office immediately should any occur or go directly to the emergency room. In the meantime, encouraged to call the office with any questions, concerns, changes symptoms. -[place]  Trula Slade DPM

## 2020-11-13 ENCOUNTER — Ambulatory Visit: Payer: Medicare HMO | Admitting: Podiatry

## 2020-11-13 ENCOUNTER — Other Ambulatory Visit: Payer: Self-pay

## 2020-11-13 ENCOUNTER — Encounter: Payer: Self-pay | Admitting: Podiatry

## 2020-11-13 DIAGNOSIS — L600 Ingrowing nail: Secondary | ICD-10-CM

## 2020-11-13 NOTE — Progress Notes (Signed)
  Subjective:  Patient ID: Peter Barnett, male    DOB: 1960-10-17,   MRN: [id number]  No chief complaint on file.   60 y.o. male presents for follow-up of ingrown nail procedure on left big toe. States he soaked for about a week. Relates the pain is better but still having occasional drainage. Denies any other pedal complaints. Denies n/v/f/c.   Past Medical History:  Diagnosis Date   Atypical mole    CAD (coronary artery disease)    a. MI/DES to Cx 2009 with staged PCI of LAD with DES and distal RCA with BMS. b. NSTEMI 12/2015 s/p DES to D1, known occlusion of dRCA tx medically.   Chronic lower back pain    Still with recurrent left sided LBP with paresthesias down left leg --primarily with activities: pain pill use is avg <90 tabs per mo   COPD (chronic obstructive pulmonary disease) ([place])    NOTED on CT chest 11/2013 done for lung ca screening.  Hosp for acute exac 11/2014.  Spirometry not supportive of this dx as of pulm eval 2017, though.  Spireva trial by Dr. Elsworth Soho 02/2015.   GERD (gastroesophageal reflux disease)    Heart attack ([place]) 12/12/2015   History of adenomatous polyp of colon 02/2009;02/2014   Recall 5 yrs ([facility])   History of kidney stones    HTN (hypertension)    Hyperlipidemia    Intracranial hemorrhage ([place]) 12/27/14   small acute hemorrhage in gliotic medial R frontal lobe   Memory loss    MI (myocardial infarction) ([place]) 2009   "just had arm pain; never any chest pain"   Migraine syndrome 10/07/2011    a couple per month usually, but worsening 2017 so Dr. 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  Complex partial sz d/o.   Short-term memory loss 10/07/2011   "post brain OR & from his seizures"--worsening 2017, Dr. Krista Blue in neurology doing w/u.   Stroke [facility]) 12/2014   Tobacco abuse    Urethral stricture since 1981   Recurrent dilation has been required [facility] urology)     Objective:  Physical Exam: Vascular: DP/PT pulses 2/4 bilateral. CFT <3 seconds. Normal hair growth on digits. No edema.  Skin. No lacerations or abrasions bilateral feet. Medal hallux left border healing well no edema present mild erythema. Small amount of drainage present.  Musculoskeletal: MMT 5/5 bilateral lower extremities in DF, PF, Inversion and Eversion. Deceased ROM in DF of ankle joint.  Neurological: Sensation intact to light touch.   Assessment:   1. Ingrown toenail      Plan:  Patient was evaluated and treated and all questions answered. Toe was evaluated and appears to be healing well.  Advised to continue soaking a few more days and letting air out at night.   Patient to follow-up as needed.   Lorenda Peck, DPM

## 2020-11-24 ENCOUNTER — Other Ambulatory Visit: Payer: Self-pay | Admitting: Cardiovascular Disease

## 2020-12-15 ENCOUNTER — Encounter: Payer: Self-pay | Admitting: Cardiology

## 2020-12-15 ENCOUNTER — Ambulatory Visit: Payer: Medicare HMO | Admitting: Cardiology

## 2020-12-15 ENCOUNTER — Other Ambulatory Visit: Payer: Self-pay

## 2020-12-15 VITALS — BP 110/72 | HR 58 | Ht 71.0 in | Wt 255.4 lb

## 2020-12-15 DIAGNOSIS — I4819 Other persistent atrial fibrillation: Secondary | ICD-10-CM

## 2020-12-15 DIAGNOSIS — Z95818 Presence of other cardiac implants and grafts: Secondary | ICD-10-CM | POA: Diagnosis not present

## 2020-12-15 DIAGNOSIS — I10 Essential (primary) hypertension: Secondary | ICD-10-CM

## 2020-12-15 DIAGNOSIS — E782 Mixed hyperlipidemia: Secondary | ICD-10-CM | POA: Diagnosis not present

## 2020-12-15 NOTE — Progress Notes (Signed)
[facility]                                     Cardiology Office Note:    Date:  12/15/2020   ID:  Peter Barnett, DOB 05/13/1960, MRN [id number]  PCP:  [name], MD  [facility] Cardiologist:  Ellis Parents, MD  [facility] Electrophysiologist:  Peter Epley, MD   Referring MD: [name], MD   Chief Complaint  Patient presents with   Follow-up    6 month post Watchman    History of Present Illness:    Peter Barnett is a 60 y.o. male with a hx of atrial fibrillation s/p ablation performed 04/21/20 followed by a left atrial appendage closure procedure on 06/19/20. Also a hx of intracranial hemorrhage, GI bleed with hx of AVMs, HTN, COPD, HLD, tobacco use, and CAD s/p NSTEMI witjh PCI to [facility] and LAD in 2009, DES to DI in 2017 with a known 99% dRCA stenosis.   He underwent LAAO closure without complication. He was discharged 06/20/20 with anticoagulation/antiplatelet recommendations for ASA 81mg  daily and Eliquis 2.5mg  BID.   He was then seen by Dr. Quentin Ore 07/25/20 at which time he had noted some swelling in his legs. He has previously been transitioned from Lasix to torsemide. He also had intermittent blood tinged sputum. He has a history of tobacco abuse and has an upcoming CT scan to screen for lung cancer to further investigate this hemoptysis.  Post implant TEE performed 07/31/20 which showed well sealed implant therefore Eliquis was discontinued and he was continued on ASA with the addition of Plavix 75mg  PO QD for 6 months. Today we will plan to stop Plavix and continue ASA lifelong. No further need for SBE at this time given duration since implant.  He is here with his wife and has no significant complaints. He has had no issues with bleeding while on ASA and Plavix. We discussed plan moving forward including continuing lifelong ASA therapy. Will plan 6 month follow up with myself (1 year post Watchman). He denies chest pain, palpitations,  orthopnea, SOB, dizziness or syncope. He does still have mild LE edema. Discussed adding LE compression stockings, leg elevation and salt reduction.   Past Medical History:  Diagnosis Date   Atypical mole    CAD (coronary artery disease)    a. MI/DES to Cx 2009 with staged PCI of LAD with DES and distal RCA with BMS. b. NSTEMI 12/2015 s/p DES to D1, known occlusion of dRCA tx medically.   Chronic lower back pain    Still with recurrent left sided LBP with paresthesias down left leg --primarily with activities: pain pill use is avg <90 tabs per mo   COPD (chronic obstructive pulmonary disease) ([place])    NOTED on CT chest 11/2013 done for lung ca screening.  Hosp for acute exac 11/2014.  Spirometry not supportive of this dx as of pulm eval 2017, though.  Spireva trial by Dr. Elsworth Soho 02/2015.   GERD (gastroesophageal reflux disease)    Heart attack ([name]) 12/12/2015   History of adenomatous polyp of colon 02/2009;02/2014   Recall 5 yrs ([facility])   History of kidney stones    HTN (hypertension)    Hyperlipidemia    Intracranial hemorrhage ([place]) 12/27/14   small acute hemorrhage in gliotic medial R frontal lobe   Memory loss  MI (myocardial infarction) ([place]) 2009   "just had arm pain; never any chest pain"   Migraine syndrome 10/07/2011    a couple per month usually, but worsening 2017 so Dr. 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  Complex partial sz d/o.   Short-term memory loss 10/07/2011   "post brain OR & from his seizures"--worsening 2017, Dr. Krista Blue in neurology doing w/u.   Stroke [facility]) 12/2014   Tobacco abuse    Urethral stricture since 1981   Recurrent dilation has been required ([facility])    Past Surgical History:  Procedure Laterality Date   ATRIAL FIBRILLATION ABLATION N/A 04/21/2020   Procedure: ATRIAL FIBRILLATION ABLATION;  Surgeon: Peter Epley, MD;  Location: [place] CV LAB;   Service: Cardiovascular;  Laterality: N/A;   BACK SURGERY  11/25/2015   CARDIAC CATHETERIZATION  2011   [place], records unavailable: pt reports "normal".   CARDIAC CATHETERIZATION N/A 12/15/2015   Procedure: Left Heart Cath and Coronary Angiography;  Surgeon: Peter M Martinique, MD;  Location: [place] CV LAB;  Service: Cardiovascular;  Laterality: N/A;   CARDIAC CATHETERIZATION N/A 12/15/2015   Procedure: Coronary Stent Intervention;  Surgeon: Peter M Martinique, MD;  Location: [place] CV LAB;  Service: Cardiovascular;  Laterality: N/A;   CARDIOVERSION N/A 06/21/2018   Procedure: CARDIOVERSION;  Surgeon: Peter Fredrickson Wonda Cheng, MD;  Location: [facility] ENDOSCOPY;  Service: Cardiovascular;  Laterality: N/A;   CARDIOVERSION N/A 01/14/2020   Procedure: CARDIOVERSION;  Surgeon: Peter Dresser, MD;  Location: [facility] ENDOSCOPY;  Service: Cardiovascular;  Laterality: N/A;   COLONOSCOPY W/ POLYPECTOMY  02/2009;02/2014   Recall 5 yr (aden poly, hyperplast polyp, int and ext hemorr).  Next recall is 02/2019.   CORONARY ANGIOPLASTY WITH STENT PLACEMENT  2009   3 DES to LCx; still with known distal RCA/PDA occlusion.  EF 60% by LV-gram.   CRANIOTOMY FOR AVM  1981   Dx'd after pt began having seizures.   CT ANGIOGRAM (CEREBRAL)  12/31/14   NORMAL--plan per neuro is to repeat this in 3 mo.  Pt states it was repeated Spring 2017 and was "fine"   [place] N/A 03/27/2019   Procedure: CYSTOSCOPY WITH URETHRAL DILATATION;  Surgeon: Peter Seal, MD;  Location: [facility];  Service: Urology;  Laterality: N/A;   LEFT ATRIAL APPENDAGE OCCLUSION N/A 06/19/2020   Procedure: LEFT ATRIAL APPENDAGE OCCLUSION;  Surgeon: Peter Epley, MD;  Location: [place] CV LAB;  Service: Cardiovascular;  Laterality: N/A;   LUMBAR DISC SURGERY  2008; 2009   Dr. Joya Barnett   LUMBAR LAMINECTOMY/DECOMPRESSION MICRODISCECTOMY N/A 11/25/2015   Procedure: Lumbar three- four Lumbar four- five Laminectomy/Foraminotomy;  Surgeon:  Leeroy Cha, MD;  Location: [place];  Service: Neurosurgery;  Laterality: N/A;  L3-4 L4-5 Laminectomy/Foraminotomy/possible Lumbar Drain   TEE WITHOUT CARDIOVERSION N/A 06/21/2018   Procedure: TRANSESOPHAGEAL ECHOCARDIOGRAM (TEE);  Surgeon: Peter Fredrickson Wonda Cheng, MD;  Location: [place];  Service: Cardiovascular;  Laterality: N/A;   TEE WITHOUT CARDIOVERSION N/A 01/14/2020   Procedure: TRANSESOPHAGEAL ECHOCARDIOGRAM (TEE);  Surgeon: Peter Dresser, MD;  Location: [facility] ENDOSCOPY;  Service: Cardiovascular;  Laterality: N/A;   TEE WITHOUT CARDIOVERSION N/A 06/19/2020   Procedure: TRANSESOPHAGEAL ECHOCARDIOGRAM (TEE);  Surgeon: Peter Epley, MD;  Location: [place] CV LAB;  Service: Cardiovascular;  Laterality: N/A;   TEE WITHOUT CARDIOVERSION N/A 07/31/2020   Procedure: TRANSESOPHAGEAL ECHOCARDIOGRAM (TEE);  Surgeon: Josue Hector, MD;  Location:  [facility] ENDOSCOPY;  Service: Cardiovascular;  Laterality: N/A;   TONSILLECTOMY     "I was little"   TRANSTHORACIC ECHOCARDIOGRAM  12/07/14   Normal LV size/fxn, EF 60%, normal valves.   URETEROSCOPY WITH HOLMIUM LASER LITHOTRIPSY Left 03/27/2019   Procedure: cysto with dilation of uretheral sticture, ureteroscopy and left retrograde pyelogram.;  Surgeon: Peter Seal, MD;  Location: [facility];  Service: Urology;  Laterality: Left;    Current Medications: Current Meds  Medication Sig   acetaminophen (TYLENOL) 500 MG tablet Take 1 tablet (500 mg total) by mouth every 6 (six) hours as needed for headache.   albuterol (PROVENTIL HFA;VENTOLIN HFA) 108 (90 BASE) MCG/ACT inhaler Inhale 2 puffs into the lungs every 6 (six) hours as needed for wheezing or shortness of breath.   aspirin EC 81 MG tablet Take 1 tablet (81 mg total) by mouth daily. Swallow whole.   Cholecalciferol (VITAMIN D3) 50 MCG (2000 UT) TABS Take 2,000 Units by mouth daily.   docusate sodium (COLACE) 100 MG capsule Take 100 mg by mouth 2 (two) times daily.    famotidine (PEPCID) 20 MG  tablet Take 20 mg by mouth daily before breakfast.    HYDROcodone-acetaminophen (NORCO) 10-325 MG per tablet Take 1 tablet by mouth every 8 (eight) hours as needed (for pain).    KLOR-CON M20 20 MEQ tablet Take 20 mEq by mouth daily.   metoprolol tartrate (LOPRESSOR) 25 MG tablet TAKE 1 TABLET BY MOUTH TWICE A DAY   MODERNA COVID-19 VACCINE 100 MCG/0.5ML injection    nitroGLYCERIN (NITROSTAT) 0.4 MG SL tablet Place 1 tablet (0.4 mg total) under the tongue every 5 (five) minutes as needed for chest pain (CP or SOB).   oxcarbazepine (TRILEPTAL) 600 MG tablet TAKE ONE TABLET BY MOUTH TWICE DAILY (Patient taking differently: Take 600 mg by mouth 2 (two) times daily.)   rosuvastatin (CRESTOR) 40 MG tablet Take 1 tablet (40 mg total) by mouth daily. (Patient taking differently: Take 40 mg by mouth every other day.)   [DISCONTINUED] clopidogrel (PLAVIX) 75 MG tablet Take 1 tablet (75 mg total) by mouth daily.     Allergies:   Brilinta [ticagrelor], Tramadol, Atorvastatin, Ezetimibe, and Morphine and related   Social History   Socioeconomic History   Marital status: Married    Spouse name: [name]   Number of children: 2   Years of education: 10 th   Highest education level: Not on file  Occupational History   Occupation: retired     Fish farm manager: RETIRED    Comment: retired  Tobacco Use   Smoking status: Former    Packs/day: 3.00    Years: 27.00    Pack years: 81.00    Types: Cigarettes    Quit date: 02/08/2001    Years since quitting: 19.8   Smokeless tobacco: Never  Vaping Use   Vaping Use: Never used  Substance and Sexual Activity   Alcohol use: Yes    Alcohol/week: 7.0 standard drinks    Types: 7 Cans of beer per week    Comment: 12 oz beer    Drug use: No   Sexual activity: Yes  Other Topics Concern   Not on file  Social History Narrative   Patient lives at home with his wife [name]).   Two adult children.   Retired from [employer] approx age 73 due to medical reasons (disabled due  to memory issues, seizure d/o and chronic LBP).   Education 10th grade.   Right handed.   Caffeine two  cups of tea and two cups of coffee.   Tobacco 45 pack-yr hx, quit approx age 10.  Alcohol: 1-2 beers per night.     No hx of alc or drug problems.   No exercise.   Diet: regular   Social Determinants of Health   Financial Resource Strain: Not on file  Food Insecurity: Not on file  Transportation Needs: Not on file  Physical Activity: Not on file  Stress: Not on file  Social Connections: Not on file     Family History: The patient's family history includes Asthma in his father; Cancer in his father; Heart attack in his paternal uncle; Hyperlipidemia in his mother; Hypertension in his father. There is no history of Stroke.  ROS:   Please see the history of present illness.    All other systems reviewed and are negative.  EKGs/Labs/Other Studies Reviewed:    The following studies were reviewed today:  TEE 07/31/20:   1. Well placed Watchman FLX 20 mm device with no leak. Average  compression 10% No thormbus noted on device and no leak by color flow.   2. Left ventricular ejection fraction, by estimation, is 55 to 60%. The  left ventricle has normal function.   3. Right ventricular systolic function is normal. The right ventricular  size is normal.   4. Left atrial size was moderately dilated. No left atrial/left atrial  appendage thrombus was detected.   5. The mitral valve is normal in structure. Mild mitral valve  regurgitation.   6. Tricuspid valve regurgitation is mild to moderate.   7. The aortic valve is tricuspid. Aortic valve regurgitation is mild.   8. Aortic dilatation noted. There is mild dilatation of the ascending  aorta, measuring 38 mm.   LAAO 06/19/20:  CONCLUSIONS:  1.Successful implantation of a WATCHMAN left atrial appendage occlusive device    2. TEE demonstrating no LAA thrombus 3. No early apparent complications.   EKG:  EKG is not ordered  today.    Recent Labs: 01/13/2020: ALT 29 01/14/2020: Magnesium 1.9 02/11/2020: BNP 25.3; TSH 1.040 07/25/2020: BUN 15; Creatinine, Ser 1.04; Hemoglobin 15.9; Platelets 228; Potassium 4.3; Sodium 141   Recent Lipid Panel    Component Value Date/Time   CHOL 149 02/06/2016 0749   TRIG 99 02/06/2016 0749   HDL 43 02/06/2016 0749   CHOLHDL 3.5 02/06/2016 0749   VLDL 20 02/06/2016 0749   LDLCALC 86 02/06/2016 0749   Physical Exam:    VS:  BP 110/72   Pulse (!) 58   Ht 5\' 11"  (1.803 m)   Wt 255 lb 6.4 oz (115.8 kg)   SpO2 96%   BMI 35.62 kg/m     Wt Readings from Last 3 Encounters:  12/15/20 255 lb 6.4 oz (115.8 kg)  07/31/20 252 lb (114.3 kg)  07/25/20 252 lb (114.3 kg)    General: Well developed, well nourished, NAD Neck: Negative for carotid bruits. No JVD Lungs:Clear to ausculation bilaterally. Breathing is unlabored. Cardiovascular: RRR with S1 S2. No murmurs Extremities: No edema.  Neuro: Alert and oriented. No focal deficits. No facial asymmetry. MAE spontaneously. Psych: Responds to questions appropriately with normal affect.    ASSESSMENT/PLAN:     1. Paroxysmal atrial fibrillation/flutter: Watchman implant  for stroke prophylaxis given a history of intracranial hemorrhage. He was placed on 2.5 mg of Eliquis BID in addition to aspirin 81 mg by mouth daily post procedure. After TEE with adequate Barnett, transitioned to ASA with the addition of Plavix  75mg  PO QD for 6 months. Today we will plan to stop Plavix and continue ASA lifelong. No further need for SBE at this time given duration since implant. Plan for 6 month follow up (1 year post implant) with myself.   2. Chronic diastolic CHF: Appears euvolemic today on exam. Continue torsemide  3. HTN: Stable with no need for changes today   4. HLD: Continue rosuvastatin  Medication Adjustments/Labs and Tests Ordered: Current medicines are reviewed at length with the patient today.  Concerns regarding medicines are outlined  above.  No orders of the defined types were placed in this encounter.  No orders of the defined types were placed in this encounter.   Patient Instructions  Medication Instructions:  Your physician has recommended you make the following change in your medication:   STOP Plavix  *If you need a refill on your cardiac medications before your next appointment, please call your pharmacy*   Lab Work: If you have labs (blood work) drawn today and your tests are completely normal, you will receive your results only by: [place] (if you have MyChart) OR A paper copy in the mail If you have any lab test that is abnormal or we need to change your treatment, we will call you to review the results.  Follow-Up: At [facility], you and your health needs are our priority.  As part of our continuing mission to provide you with exceptional heart care, we have created designated Provider Care Teams.  These Care Teams include your primary Cardiologist (physician) and Advanced Practice Providers (APPs -  Physician Assistants and Nurse Practitioners) who all work together to provide you with the care you need, when you need it.  We recommend signing up for the patient portal called "MyChart".  Sign up information is provided on this After Visit Summary.  MyChart is used to connect with patients for Virtual Visits (Telemedicine).  Patients are able to view lab/test results, encounter notes, upcoming appointments, etc.  Non-urgent messages can be sent to your provider as well.   To learn more about what you can do with MyChart, go to [email or web address].    Your next appointment:   6 month(s)  The format for your next appointment:   In Person  Provider:   Kathyrn Drown, NP      Signed, Peter Drown, NP  12/15/2020 11:34 AM    [place]

## 2020-12-15 NOTE — Patient Instructions (Signed)
Medication Instructions:  Your physician has recommended you make the following change in your medication:   STOP Plavix  *If you need a refill on your cardiac medications before your next appointment, please call your pharmacy*   Lab Work: If you have labs (blood work) drawn today and your tests are completely normal, you will receive your results only by: [place] (if you have MyChart) OR A paper copy in the mail If you have any lab test that is abnormal or we need to change your treatment, we will call you to review the results.  Follow-Up: At [facility], you and your health needs are our priority.  As part of our continuing mission to provide you with exceptional heart care, we have created designated Provider Care Teams.  These Care Teams include your primary Cardiologist (physician) and Advanced Practice Providers (APPs -  Physician Assistants and Nurse Practitioners) who all work together to provide you with the care you need, when you need it.  We recommend signing up for the patient portal called "MyChart".  Sign up information is provided on this After Visit Summary.  MyChart is used to connect with patients for Virtual Visits (Telemedicine).  Patients are able to view lab/test results, encounter notes, upcoming appointments, etc.  Non-urgent messages can be sent to your provider as well.   To learn more about what you can do with MyChart, go to [email or web address].    Your next appointment:   6 month(s)  The format for your next appointment:   In Person  Provider:   Kathyrn Drown, NP

## 2020-12-16 DIAGNOSIS — M5442 Lumbago with sciatica, left side: Secondary | ICD-10-CM | POA: Diagnosis not present

## 2020-12-16 DIAGNOSIS — E1165 Type 2 diabetes mellitus with hyperglycemia: Secondary | ICD-10-CM | POA: Diagnosis not present

## 2020-12-16 DIAGNOSIS — E7800 Pure hypercholesterolemia, unspecified: Secondary | ICD-10-CM | POA: Diagnosis not present

## 2020-12-16 DIAGNOSIS — G8929 Other chronic pain: Secondary | ICD-10-CM | POA: Diagnosis not present

## 2020-12-16 DIAGNOSIS — Z79899 Other long term (current) drug therapy: Secondary | ICD-10-CM | POA: Diagnosis not present

## 2020-12-16 DIAGNOSIS — E782 Mixed hyperlipidemia: Secondary | ICD-10-CM | POA: Diagnosis not present

## 2020-12-16 DIAGNOSIS — I10 Essential (primary) hypertension: Secondary | ICD-10-CM | POA: Diagnosis not present

## 2020-12-30 ENCOUNTER — Telehealth: Payer: Self-pay

## 2020-12-30 NOTE — Telephone Encounter (Signed)
Letter has been sent to patient instructing them to call us if they are still interested in completing their sleep study. If we have not received a response from the patient within 30 days of this notice, the order will be cancelled and they will need to discuss the need for a sleep study at their next office visit.  ° °

## 2021-01-23 ENCOUNTER — Other Ambulatory Visit: Payer: Self-pay | Admitting: Cardiovascular Disease

## 2021-02-04 ENCOUNTER — Other Ambulatory Visit: Payer: Self-pay | Admitting: Cardiovascular Disease

## 2021-02-06 ENCOUNTER — Other Ambulatory Visit: Payer: Self-pay

## 2021-02-06 ENCOUNTER — Encounter: Payer: Self-pay | Admitting: Cardiovascular Disease

## 2021-02-06 MED ORDER — KLOR-CON M20 20 MEQ PO TBCR: 20.0000 meq | EXTENDED_RELEASE_TABLET | Freq: Every day | ORAL | 1 refills | Status: DC

## 2021-03-12 ENCOUNTER — Encounter: Payer: Self-pay | Admitting: Cardiology

## 2021-03-12 DIAGNOSIS — R0683 Snoring: Secondary | ICD-10-CM

## 2021-03-12 DIAGNOSIS — R5382 Chronic fatigue, unspecified: Secondary | ICD-10-CM

## 2021-03-19 ENCOUNTER — Telehealth: Payer: Self-pay | Admitting: *Deleted

## 2021-03-19 NOTE — Telephone Encounter (Signed)
/  PER AETNA MEDICARE NO PA REQUIRED FOR HST

## 2021-03-30 DIAGNOSIS — E1165 Type 2 diabetes mellitus with hyperglycemia: Secondary | ICD-10-CM | POA: Diagnosis not present

## 2021-03-30 DIAGNOSIS — I5032 Chronic diastolic (congestive) heart failure: Secondary | ICD-10-CM | POA: Diagnosis not present

## 2021-03-30 DIAGNOSIS — M25511 Pain in right shoulder: Secondary | ICD-10-CM | POA: Diagnosis not present

## 2021-03-30 DIAGNOSIS — J439 Emphysema, unspecified: Secondary | ICD-10-CM | POA: Diagnosis not present

## 2021-03-30 DIAGNOSIS — M5442 Lumbago with sciatica, left side: Secondary | ICD-10-CM | POA: Diagnosis not present

## 2021-03-30 DIAGNOSIS — F1120 Opioid dependence, uncomplicated: Secondary | ICD-10-CM | POA: Diagnosis not present

## 2021-03-30 DIAGNOSIS — G40909 Epilepsy, unspecified, not intractable, without status epilepticus: Secondary | ICD-10-CM | POA: Diagnosis not present

## 2021-03-30 DIAGNOSIS — R69 Illness, unspecified: Secondary | ICD-10-CM | POA: Diagnosis not present

## 2021-03-30 DIAGNOSIS — I480 Paroxysmal atrial fibrillation: Secondary | ICD-10-CM | POA: Diagnosis not present

## 2021-03-30 DIAGNOSIS — E782 Mixed hyperlipidemia: Secondary | ICD-10-CM | POA: Diagnosis not present

## 2021-03-31 DIAGNOSIS — M25511 Pain in right shoulder: Secondary | ICD-10-CM | POA: Diagnosis not present

## 2021-03-31 DIAGNOSIS — G8929 Other chronic pain: Secondary | ICD-10-CM | POA: Diagnosis not present

## 2021-03-31 DIAGNOSIS — M47812 Spondylosis without myelopathy or radiculopathy, cervical region: Secondary | ICD-10-CM | POA: Diagnosis not present

## 2021-03-31 DIAGNOSIS — M19011 Primary osteoarthritis, right shoulder: Secondary | ICD-10-CM | POA: Diagnosis not present

## 2021-04-28 ENCOUNTER — Ambulatory Visit (HOSPITAL_BASED_OUTPATIENT_CLINIC_OR_DEPARTMENT_OTHER): Payer: Medicare HMO | Admitting: Cardiovascular Disease

## 2021-04-28 DIAGNOSIS — R0683 Snoring: Secondary | ICD-10-CM

## 2021-04-28 DIAGNOSIS — R5382 Chronic fatigue, unspecified: Secondary | ICD-10-CM

## 2021-04-30 ENCOUNTER — Ambulatory Visit (HOSPITAL_BASED_OUTPATIENT_CLINIC_OR_DEPARTMENT_OTHER): Payer: Medicare HMO | Attending: Cardiovascular Disease | Admitting: Cardiovascular Disease

## 2021-04-30 DIAGNOSIS — R0683 Snoring: Secondary | ICD-10-CM | POA: Diagnosis not present

## 2021-04-30 DIAGNOSIS — G4733 Obstructive sleep apnea (adult) (pediatric): Secondary | ICD-10-CM | POA: Diagnosis not present

## 2021-04-30 DIAGNOSIS — R5382 Chronic fatigue, unspecified: Secondary | ICD-10-CM | POA: Diagnosis not present

## 2021-04-30 DIAGNOSIS — G4736 Sleep related hypoventilation in conditions classified elsewhere: Secondary | ICD-10-CM | POA: Insufficient documentation

## 2021-05-06 ENCOUNTER — Encounter (HOSPITAL_BASED_OUTPATIENT_CLINIC_OR_DEPARTMENT_OTHER): Payer: Self-pay | Admitting: Cardiovascular Disease

## 2021-05-06 NOTE — Procedures (Signed)
? ? ? ? ? ?  Patient Name: Peter Barnett, Zinni ?Study Date: 04/30/2021 ?Gender: Male ?D.O.B: 12-14-1960 ?Age (years): 64 ?Referring Provider: Cassie Freer ONeal ?Height (inches): 71 ?Interpreting Physician: Shelva Majestic MD, ABSM ?Weight (lbs): 250 ?RPSGT: Gerhard Perches ?BMI: 35 ?MRN: [id number] ?Neck Size: 17.00 ? ?CLINICAL INFORMATION ?Sleep Study Type: HST ? ?Indication for sleep study: snoring, atrial fibrillation, fatigue ? ?Epworth Sleepiness Score: 11 ? ?SLEEP STUDY TECHNIQUE ?A multi-channel overnight portable sleep study was performed. The channels recorded were: nasal airflow, thoracic respiratory movement, and oxygen saturation with a pulse oximetry. Snoring was also monitored. ? ?MEDICATIONS ?Patient self administered medications include: N/A. ? ?SLEEP ARCHITECTURE ?Patient was studied for 362 minutes. The sleep efficiency was 100.0 % and the patient was supine for 99.2%. The arousal index was 0.0 per hour. ? ?RESPIRATORY PARAMETERS ?The overall AHI was 71.9 per hour, with a central apnea index of 0 per hour. ? ?The oxygen nadir was 71% during sleep. ? ?CARDIAC DATA ?Mean heart rate during sleep was 52.9 bpm.  The lowest HR during sleep was 42 bpm and highest HR was 85 bpm. ? ?IMPRESSIONS ?- Severe obstructive sleep apnea occurred during this study (AHI 71.9/h). There is a significant positional component with supine sleep AHI 72.4/h versus non-supine sleep AHI 20.0/h. ?- Severe oxygen desaturation to a nadir of 71%. Time spent < 89% was 57 minutes.  ?- Patient snored for 74.6 minutes (20.6%) during the sleep. ? ?DIAGNOSIS ?- Obstructive Sleep Apnea (G47.33) ?- Nocturnal Hypoxemia (G47.36) ? ?RECOMMENDATIONS ?- In this patient with severe sleep apnea and significant cardiovascular morbidities, recommend an in-lab CPAP titration; if unable for an in-lab study initiate Auto - PAP with EPR of 3 at 7 - 20 cm of water.  ?- Effort should be made to optimize nasal and oropharyngeal patency. ?- Positional  therapy avoiding supine position during sleep. ?- Avoid alcohol, sedatives and other CNS depressants that may worsen sleep apnea and disrupt normal sleep architecture. ?- Sleep hygiene should be reviewed to assess factors that may improve sleep quality. ?- Weight management (BMI 35) and regular exercise should be initiated or continued. ?- Recommend a download and sleep clionic evaluation after 4 weeks of therapy.  ? ? ?[Electronically signed] 05/06/2021 10:33 AM ? ?Shelva Majestic MD, [facility], ABSM ?[place], [place] Board of Sleep Medicine ? ? ?NPI: [id number] ? ?[place] ?PH: [phone]   FX: [phone] ?ACCREDITED BY THE AMERICAN ACADEMY OF SLEEP MEDICINE ? ?

## 2021-05-13 ENCOUNTER — Telehealth: Payer: Self-pay | Admitting: *Deleted

## 2021-05-13 ENCOUNTER — Other Ambulatory Visit: Payer: Self-pay | Admitting: Cardiovascular Disease

## 2021-05-13 DIAGNOSIS — J449 Chronic obstructive pulmonary disease, unspecified: Secondary | ICD-10-CM

## 2021-05-13 DIAGNOSIS — I10 Essential (primary) hypertension: Secondary | ICD-10-CM

## 2021-05-13 DIAGNOSIS — G4733 Obstructive sleep apnea (adult) (pediatric): Secondary | ICD-10-CM

## 2021-05-13 DIAGNOSIS — I4891 Unspecified atrial fibrillation: Secondary | ICD-10-CM

## 2021-05-13 DIAGNOSIS — G4736 Sleep related hypoventilation in conditions classified elsewhere: Secondary | ICD-10-CM

## 2021-05-13 NOTE — Telephone Encounter (Signed)
Patient notified of HST results and recommendations. All of his questions were answered. He agrees to proceed with CPAP treatment. ?

## 2021-05-15 NOTE — Progress Notes (Signed)
?[facility]   ?                                  ?Cardiology Office Note:   ? ?Date:  05/18/2021  ? ?ID:  Peter Barnett, DOB 16-Sep-1960, MRN [id number] ? ?PCP:  [name], MD  ?[facility] Cardiologist:  Ellis Parents, MD  ?[facility] Electrophysiologist:  [name], MD  ? ?Referring MD: [name], MD  ? ?Chief Complaint  ?Patient presents with  ? Follow-up  ?  1 year follow up s/p Watchman   ? ?History of Present Illness:   ? ?Peter Barnett is a 61 y.o. male with a hx of atrial fibrillation s/p ablation performed 04/21/20 followed by a left atrial appendage closure procedure on 06/19/20. Also a hx of intracranial hemorrhage, GI bleed with hx of AVMs, HTN, COPD, HLD, tobacco use, and CAD s/p NSTEMI with PCI to [facility] and LAD in 2009, DES to DI in 2017 with a known 99% dRCA stenosis.  ?  ?He underwent LAAO closure 0/09/38 without complication. Anticoagulation/antiplatelet recommendations for ASA '81mg'$  daily and Eliquis 2.'5mg'$  BID. He was then seen by Dr. Quentin Ore 07/25/20 at which time he had noted some swelling in his legs. He has previously been transitioned from Lasix to torsemide.  ?  ?Post implant TEE performed 07/31/20 which showed well sealed implant therefore Eliquis was discontinued and he was continued on ASA with the addition of Plavix '75mg'$  PO QD for 6 months. He was seen by myself in follow up and Plavix was discontinued. He was kept on lifelong ASA.   ? ?Today he is here with his wife. He has been doing well. Continues to have mild LE edema and reports upper extremity muscle aches. He is on a statin and states this has been an ongoing issue since starting. He has tried Lipitor and is currently taking Crestor. He was referred for Repatha many years ago however this was found to be too expensive for them. He is interested in [facility] referral once again. As far as his edema, he has no orthopnea or SOB. Denies chest pain, palpitations, dizziness, or syncope.  Tolerating ASA well. No other concerns today.  ? ?Past Medical History:  ?Diagnosis Date  ? Atypical mole   ? CAD (coronary artery disease)   ? a. MI/DES to Cx 2009 with staged PCI of LAD with DES and distal RCA with BMS. b. NSTEMI 12/2015 s/p DES to D1, known occlusion of dRCA tx medically.  ? Chronic lower back pain   ? Still with recurrent left sided LBP with paresthesias down left leg --primarily with activities: pain pill use is avg <90 tabs per mo  ? COPD (chronic obstructive pulmonary disease) ([place])   ? NOTED on CT chest 11/2013 done for lung ca screening.  Hosp for acute exac 11/2014.  Spirometry not supportive of this dx as of pulm eval 2017, though.  Spireva trial by Dr. Elsworth Soho 02/2015.  ? GERD (gastroesophageal reflux disease)   ? Heart attack ([place]) 12/12/2015  ? History of adenomatous polyp of colon 02/2009;02/2014  ? Recall 5 yrs ([facility])  ? History of kidney stones   ? HTN (hypertension)   ? Hyperlipidemia   ? Intracranial hemorrhage ([place]) 12/27/14  ? small acute hemorrhage in gliotic medial R frontal lobe  ? Memory loss   ? MI (myocardial infarction) ([place])  2009  ? "just had arm pain; never any chest pain"  ? Migraine syndrome 10/07/2011  ?  a couple per month usually, but worsening 2017 so Dr. Krista Blue adjusted med  ? 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 Seizures ([place])   ? last one was fall of 2016.  Complex partial sz d/o.  ? Short-term memory loss 10/07/2011  ? "post brain OR & from his seizures"--worsening 2017, Dr. Krista Blue in neurology doing w/u.  ? Stroke [facility]) 12/2014  ? Tobacco abuse   ? Urethral stricture since 1981  ? Recurrent dilation has been required [facility] urology)  ? ? ?Past Surgical History:  ?Procedure Laterality Date  ? ATRIAL FIBRILLATION ABLATION N/A 04/21/2020  ? Procedure: ATRIAL FIBRILLATION ABLATION;  Surgeon: [name], MD;  Location: [place] [facility];  Service: Cardiovascular;  Laterality: N/A;  ? BACK SURGERY  11/25/2015  ? CARDIAC CATHETERIZATION   2011  ? Mikel Cella, records unavailable: pt reports "normal".  ? CARDIAC CATHETERIZATION N/A 12/15/2015  ? Procedure: Left Heart Cath and Coronary Angiography;  Surgeon: Peter M Martinique, MD;  Location: [place] [facility];  Service: Cardiovascular;  Laterality: N/A;  ? CARDIAC CATHETERIZATION N/A 12/15/2015  ? Procedure: Coronary Stent Intervention;  Surgeon: Peter M Martinique, MD;  Location: [place] [facility];  Service: Cardiovascular;  Laterality: N/A;  ? CARDIOVERSION N/A 06/21/2018  ? Procedure: CARDIOVERSION;  Surgeon: [name], MD;  Location: [place];  Service: Cardiovascular;  Laterality: N/A;  ? CARDIOVERSION N/A 01/14/2020  ? Procedure: CARDIOVERSION;  Surgeon: Buford Dresser, MD;  Location: [facility] ENDOSCOPY;  Service: Cardiovascular;  Laterality: N/A;  ? COLONOSCOPY W/ POLYPECTOMY  02/2009;02/2014  ? Recall 5 yr (aden poly, hyperplast polyp, int and ext hemorr).  Next recall is 02/2019.  ? [place] STENT PLACEMENT  2009  ? 3 DES to LCx; still with known distal RCA/PDA occlusion.  EF 60% by LV-gram.  ? CRANIOTOMY FOR AVM  1981  ? Dx'd after pt began having seizures.  ? CT ANGIOGRAM (CEREBRAL)  12/31/14  ? NORMAL--plan per neuro is to repeat this in 3 mo.  Pt states it was repeated Spring 2017 and was "fine"  ? CYSTOSCOPY WITH URETHRAL DILATATION N/A 03/27/2019  ? Procedure: CYSTOSCOPY WITH URETHRAL DILATATION;  Surgeon: Irine Seal, MD;  Location: [facility];  Service: Urology;  Laterality: N/A;  ? LEFT ATRIAL APPENDAGE OCCLUSION N/A 06/19/2020  ? Procedure: LEFT ATRIAL APPENDAGE OCCLUSION;  Surgeon: [name], MD;  Location: [place] [facility];  Service: Cardiovascular;  Laterality: N/A;  ? LUMBAR [place] SURGERY  2008; 2009  ? Dr. Joya Salm  ? LUMBAR LAMINECTOMY/DECOMPRESSION MICRODISCECTOMY N/A 11/25/2015  ? Procedure: Lumbar three- four Lumbar four- five Laminectomy/Foraminotomy;  Surgeon: Leeroy Cha, MD;  Location: [place];  Service: Neurosurgery;  Laterality: N/A;  L3-4 L4-5  Laminectomy/Foraminotomy/possible Lumbar Drain  ? TEE WITHOUT CARDIOVERSION N/A 06/21/2018  ? Procedure: TRANSESOPHAGEAL ECHOCARDIOGRAM (TEE);  Surgeon: Acie Fredrickson Wonda Cheng, MD;  Location: [place];  Service: Cardiovascular;  Laterality: N/A;  ? TEE WITHOUT CARDIOVERSION N/A 01/14/2020  ? Procedure: TRANSESOPHAGEAL ECHOCARDIOGRAM (TEE);  Surgeon: Buford Dresser, MD;  Location: [place];  Service: Cardiovascular;  Laterality: N/A;  ? TEE WITHOUT CARDIOVERSION N/A 06/19/2020  ? Procedure: TRANSESOPHAGEAL ECHOCARDIOGRAM (TEE);  Surgeon: [name], MD;  Location: [place] [facility];  Service: Cardiovascular;  Laterality: N/A;  ? TEE WITHOUT CARDIOVERSION N/A 07/31/2020  ? Procedure: TRANSESOPHAGEAL ECHOCARDIOGRAM (TEE);  Surgeon: Josue Hector, MD;  Location: [facility] ENDOSCOPY;  Service:  Cardiovascular;  Laterality: N/A;  ? TONSILLECTOMY    ? "I was little"  ? TRANSTHORACIC ECHOCARDIOGRAM  12/07/14  ? Normal LV size/fxn, EF 60%, normal valves.  ? URETEROSCOPY WITH HOLMIUM LASER LITHOTRIPSY Left 03/27/2019  ? Procedure: cysto with dilation of uretheral sticture, ureteroscopy and left retrograde pyelogram.;  Surgeon: Irine Seal, MD;  Location: [facility];  Service: Urology;  Laterality: Left;  ? ? ?Current Medications: ?Current Meds  ?Medication Sig  ? acetaminophen (TYLENOL) 500 MG tablet Take 1 tablet (500 mg total) by mouth every 6 (six) hours as needed for headache.  ? albuterol (PROVENTIL HFA;VENTOLIN HFA) 108 (90 BASE) MCG/ACT inhaler Inhale 2 puffs into the lungs every 6 (six) hours as needed for wheezing or shortness of breath.  ? aspirin EC 81 MG tablet Take 1 tablet (81 mg total) by mouth daily. Swallow whole.  ? Cholecalciferol (VITAMIN D3) 50 MCG (2000 UT) TABS Take 2,000 Units by mouth daily.  ? docusate sodium (COLACE) 100 MG capsule Take 100 mg by mouth 2 (two) times daily.   ? famotidine (PEPCID) 20 MG tablet Take 20 mg by mouth daily before breakfast.   ? HYDROcodone-acetaminophen (NORCO)  10-325 MG per tablet Take 1 tablet by mouth every 8 (eight) hours as needed (for pain).   ? KLOR-CON M20 20 MEQ tablet Take 1 tablet (20 mEq total) by mouth daily.  ? metoprolol tartrate (LOPRESSOR) 25 MG tablet TAKE 1 TA

## 2021-05-18 ENCOUNTER — Ambulatory Visit: Payer: Medicare HMO | Admitting: Cardiology

## 2021-05-18 VITALS — BP 116/72 | HR 58 | Ht 71.0 in | Wt 256.2 lb

## 2021-05-18 DIAGNOSIS — I4891 Unspecified atrial fibrillation: Secondary | ICD-10-CM | POA: Diagnosis not present

## 2021-05-18 DIAGNOSIS — I10 Essential (primary) hypertension: Secondary | ICD-10-CM

## 2021-05-18 DIAGNOSIS — Z79899 Other long term (current) drug therapy: Secondary | ICD-10-CM

## 2021-05-18 DIAGNOSIS — Z95818 Presence of other cardiac implants and grafts: Secondary | ICD-10-CM

## 2021-05-18 LAB — CBC
Hematocrit: 47.6 % (ref 37.5–51.0)
Hemoglobin: 16.2 g/dL (ref 13.0–17.7)
MCH: 29.6 pg (ref 26.6–33.0)
MCHC: 34 g/dL (ref 31.5–35.7)
MCV: 87 fL (ref 79–97)
Platelets: 198 10*3/uL (ref 150–450)
RBC: 5.48 x10E6/uL (ref 4.14–5.80)
RDW: 14 % (ref 11.6–15.4)
WBC: 7.9 10*3/uL (ref 3.4–10.8)

## 2021-05-18 LAB — BASIC METABOLIC PANEL
BUN/Creatinine Ratio: 12 (ref 10–24)
BUN: 11 mg/dL (ref 8–27)
CO2: 23 mmol/L (ref 20–29)
Calcium: 9.5 mg/dL (ref 8.6–10.2)
Chloride: 103 mmol/L (ref 96–106)
Creatinine, Ser: 0.91 mg/dL (ref 0.76–1.27)
Glucose: 106 mg/dL — ABNORMAL HIGH (ref 70–99)
Potassium: 4.3 mmol/L (ref 3.5–5.2)
Sodium: 138 mmol/L (ref 134–144)
eGFR: 96 mL/min/{1.73_m2} (ref >59)

## 2021-05-18 NOTE — Patient Instructions (Addendum)
Medication Instructions:  ?Your physician has recommended you make the following change in your medication:  ?TAKE AN EXTRA DOSE OF TORSEMIDE IF YOU HAVE SWELLING OR FOR WEIGHT GAIN - 3 LBS IN A DAY OR 5 LBS IN A WEEK ? ?*If you need a refill on your cardiac medications before your next appointment, please call your pharmacy* ? ? ?Lab Work: ? TODAY: BMET, CBC ?If you have labs (blood work) drawn today and your tests are completely normal, you will receive your results only by: ?MyChart Message (if you have MyChart) OR ?A paper copy in the mail ?If you have any lab test that is abnormal or we need to change your treatment, we will call you to review the results. ? ? ?Testing/Procedures: ?NONE ? ? ?Follow-Up: ?At [facility], you and your health needs are our priority.  As part of our continuing mission to provide you with exceptional heart care, we have created designated Provider Care Teams.  These Care Teams include your primary Cardiologist (physician) and Advanced Practice Providers (APPs -  Physician Assistants and Nurse Practitioners) who all work together to provide you with the care you need, when you need it. ? ?We recommend signing up for the patient portal called "MyChart".  Sign up information is provided on this After Visit Summary.  MyChart is used to connect with patients for Virtual Visits (Telemedicine).  Patients are able to view lab/test results, encounter notes, upcoming appointments, etc.  Non-urgent messages can be sent to your provider as well.   ?To learn more about what you can do with MyChart, go to [email or web address].   ? ?Your next appointment:   ?6-8 week(s) ? ?The format for your next appointment:   ?In Person ? ?Provider:   ?DR. O'NEAL  ? ?Important Information About Sugar ? ? ? ? ?  ?

## 2021-05-28 ENCOUNTER — Ambulatory Visit: Payer: Medicare HMO

## 2021-05-28 ENCOUNTER — Ambulatory Visit: Payer: Medicare HMO | Admitting: Podiatry

## 2021-05-28 ENCOUNTER — Ambulatory Visit (INDEPENDENT_AMBULATORY_CARE_PROVIDER_SITE_OTHER): Payer: Medicare HMO

## 2021-05-28 ENCOUNTER — Encounter: Payer: Self-pay | Admitting: Podiatry

## 2021-05-28 DIAGNOSIS — M722 Plantar fascial fibromatosis: Secondary | ICD-10-CM | POA: Diagnosis not present

## 2021-05-28 DIAGNOSIS — M7662 Achilles tendinitis, left leg: Secondary | ICD-10-CM

## 2021-05-28 DIAGNOSIS — M7661 Achilles tendinitis, right leg: Secondary | ICD-10-CM

## 2021-05-28 MED ORDER — TRIAMCINOLONE ACETONIDE 10 MG/ML IJ SUSP
10.0000 mg | Freq: Once | INTRAMUSCULAR | Status: AC
  Administered 2021-05-28: 10 mg

## 2021-05-28 MED ORDER — METHYLPREDNISOLONE 4 MG PO TBPK: ORAL_TABLET | ORAL | 0 refills | Status: DC

## 2021-05-29 ENCOUNTER — Other Ambulatory Visit: Payer: Self-pay | Admitting: Cardiovascular Disease

## 2021-05-29 NOTE — Progress Notes (Signed)
Subjective:  ? ?Patient ID: Peter Barnett, male   DOB: 61 y.o.   MRN: [id number]  ? ?HPI ?Patient presents stating that he has developed severe pain in his left heel over the last week and has had 1 history of this about a year ago and did well with treatment.  States hard to walk on ? ? ?ROS ? ? ?   ?Objective:  ?Physical Exam  ?Neurovascular status intact with exquisite discomfort left plantar fascia at the insertion to the calcaneus with fluid buildup within the area no indication that it involves the calcaneus itself ? ?   ?Assessment:  ?Acute Planter fasciitis left with fluid buildup ? ?   ?Plan:  ?H&P x-ray reviewed and today I did sterile prep and I injected the fascial insertion left 3 mg Kenalog 5 mg Xylocaine advised on reduced activity ice therapy and support and reappoint as symptoms indicate ? ?X-rays indicate spur formation no indications of acute stress fracture arthritis  ?   ? ? ?

## 2021-06-16 ENCOUNTER — Encounter (HOSPITAL_BASED_OUTPATIENT_CLINIC_OR_DEPARTMENT_OTHER): Payer: Self-pay | Admitting: Cardiovascular Disease

## 2021-06-24 ENCOUNTER — Encounter (HOSPITAL_BASED_OUTPATIENT_CLINIC_OR_DEPARTMENT_OTHER): Payer: Medicare HMO | Admitting: Cardiovascular Disease

## 2021-07-08 DIAGNOSIS — I10 Essential (primary) hypertension: Secondary | ICD-10-CM | POA: Diagnosis not present

## 2021-07-08 DIAGNOSIS — E119 Type 2 diabetes mellitus without complications: Secondary | ICD-10-CM | POA: Diagnosis not present

## 2021-07-08 DIAGNOSIS — M5442 Lumbago with sciatica, left side: Secondary | ICD-10-CM | POA: Diagnosis not present

## 2021-07-08 DIAGNOSIS — G8929 Other chronic pain: Secondary | ICD-10-CM | POA: Diagnosis not present

## 2021-07-15 NOTE — Progress Notes (Signed)
Cardiology Office Note:   Date:  07/16/2021  NAME:  Peter Barnett    MRN: [id number] DOB:  03-Mar-1960   PCP:  [name], MD  Cardiologist:  Ellis Parents, MD  Electrophysiologist:  [name], MD   Referring MD: [name], MD   Chief Complaint  Patient presents with   Follow-up   History of Present Illness:   Peter Barnett is a 61 y.o. male with a hx of CAD, persistent Afib s/p ablation, HLD who presents for follow-up.  Reports his Watchman procedure went well.  No recurrence of atrial fibrillation.  He is off anticoagulation.  Denies any chest pain or trouble breathing.  Currently in active.  Does have some lower extremity edema that is improved with torsemide.  BMI 35.  Weight 256.  He needs to work on losing weight.  He understands this.  Not exercising but doing yard work.  No significant limitations.  Cholesterol level not at goal.  We discussed that his cholesterol level is not at goal.  Most recent LDL 105.  On Crestor every other day.  Cannot tolerate Zetia.  We discussed referral to pharmacy for PCSK9 inhibitor therapy.  He is interested.  Problem List 1. CAD -PCI to LCX/LAD 2009 -DES to D1 2017 -99% dRCA with L to R collaterals  2. ICH/CVA 2016 -R frontal AVM s/p craniotomy 1981 3. COPD/Tobacco abuse  4. HTN 5. HLD -T chol 182, LDL 105, HDL 41, triglycerides 210 6. Persistent Afib/flutter -CHADSVASC = 2 (HTN, CAD) -Afib ablation 04/21/2020 -20 mm Watchman FLX  Past Medical History: Past Medical History:  Diagnosis Date   Atypical mole    CAD (coronary artery disease)    a. MI/DES to Cx 2009 with staged PCI of LAD with DES and distal RCA with BMS. b. NSTEMI 12/2015 s/p DES to D1, known occlusion of dRCA tx medically.   Chronic lower back pain    Still with recurrent left sided LBP with paresthesias down left leg --primarily with activities: pain pill use is avg <90 tabs per mo   COPD (chronic obstructive pulmonary disease) ([place])    NOTED  on CT chest 11/2013 done for lung ca screening.  Hosp for acute exac 11/2014.  Spirometry not supportive of this dx as of pulm eval 2017, though.  Spireva trial by Dr. Elsworth Soho 02/2015.   GERD (gastroesophageal reflux disease)    Heart attack ([place]) 12/12/2015   History of adenomatous polyp of colon 02/2009;02/2014   Recall 5 yrs ([facility])   History of kidney stones    HTN (hypertension)    Hyperlipidemia    Intracranial hemorrhage ([place]) 12/27/14   small acute hemorrhage in gliotic medial R frontal lobe   Memory loss    MI (myocardial infarction) ([place]) 2009   "just had arm pain; never any chest pain"   Migraine syndrome 10/07/2011    a couple per month usually, but worsening 2017 so Dr. 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  Complex partial sz d/o.   Short-term memory loss 10/07/2011   "post brain OR & from his seizures"--worsening 2017, Dr. Krista Blue in neurology doing w/u.   Stroke [facility]) 12/2014   Tobacco abuse    Urethral stricture since 1981   Recurrent dilation has been required [facility] urology)    Past Surgical History: Past Surgical History:  Procedure Laterality Date   ATRIAL FIBRILLATION  ABLATION N/A 04/21/2020   Procedure: ATRIAL FIBRILLATION ABLATION;  Surgeon: [name], MD;  Location: [place] CV LAB;  Service: Cardiovascular;  Laterality: N/A;   BACK SURGERY  11/25/2015   CARDIAC CATHETERIZATION  2011   [place], records unavailable: pt reports "normal".   CARDIAC CATHETERIZATION N/A 12/15/2015   Procedure: Left Heart Cath and Coronary Angiography;  Surgeon: Peter M Martinique, MD;  Location: [place] CV LAB;  Service: Cardiovascular;  Laterality: N/A;   CARDIAC CATHETERIZATION N/A 12/15/2015   Procedure: Coronary Stent Intervention;  Surgeon: Peter M Martinique, MD;  Location: [place] CV LAB;  Service: Cardiovascular;  Laterality: N/A;   CARDIOVERSION N/A 06/21/2018   Procedure:  CARDIOVERSION;  Surgeon: Acie Fredrickson Wonda Cheng, MD;  Location: [facility] ENDOSCOPY;  Service: Cardiovascular;  Laterality: N/A;   CARDIOVERSION N/A 01/14/2020   Procedure: CARDIOVERSION;  Surgeon: Buford Dresser, MD;  Location: [facility] ENDOSCOPY;  Service: Cardiovascular;  Laterality: N/A;   COLONOSCOPY W/ POLYPECTOMY  02/2009;02/2014   Recall 5 yr (aden poly, hyperplast polyp, int and ext hemorr).  Next recall is 02/2019.   CORONARY ANGIOPLASTY WITH STENT PLACEMENT  2009   3 DES to LCx; still with known distal RCA/PDA occlusion.  EF 60% by LV-gram.   CRANIOTOMY FOR AVM  1981   Dx'd after pt began having seizures.   CT ANGIOGRAM (CEREBRAL)  12/31/14   NORMAL--plan per neuro is to repeat this in 3 mo.  Pt states it was repeated Spring 2017 and was "fine"   [place] N/A 03/27/2019   Procedure: CYSTOSCOPY WITH URETHRAL DILATATION;  Surgeon: Irine Seal, MD;  Location: [facility];  Service: Urology;  Laterality: N/A;   LEFT ATRIAL APPENDAGE OCCLUSION N/A 06/19/2020   Procedure: LEFT ATRIAL APPENDAGE OCCLUSION;  Surgeon: [name], MD;  Location: [place] CV LAB;  Service: Cardiovascular;  Laterality: N/A;   LUMBAR DISC SURGERY  2008; 2009   Dr. Joya Salm   LUMBAR LAMINECTOMY/DECOMPRESSION MICRODISCECTOMY N/A 11/25/2015   Procedure: Lumbar three- four Lumbar four- five Laminectomy/Foraminotomy;  Surgeon: Leeroy Cha, MD;  Location: [place];  Service: Neurosurgery;  Laterality: N/A;  L3-4 L4-5 Laminectomy/Foraminotomy/possible Lumbar Drain   TEE WITHOUT CARDIOVERSION N/A 06/21/2018   Procedure: TRANSESOPHAGEAL ECHOCARDIOGRAM (TEE);  Surgeon: Acie Fredrickson Wonda Cheng, MD;  Location: [place];  Service: Cardiovascular;  Laterality: N/A;   TEE WITHOUT CARDIOVERSION N/A 01/14/2020   Procedure: TRANSESOPHAGEAL ECHOCARDIOGRAM (TEE);  Surgeon: Buford Dresser, MD;  Location: [facility] ENDOSCOPY;  Service: Cardiovascular;  Laterality: N/A;   TEE WITHOUT CARDIOVERSION N/A 06/19/2020   Procedure:  TRANSESOPHAGEAL ECHOCARDIOGRAM (TEE);  Surgeon: [name], MD;  Location: [place] CV LAB;  Service: Cardiovascular;  Laterality: N/A;   TEE WITHOUT CARDIOVERSION N/A 07/31/2020   Procedure: TRANSESOPHAGEAL ECHOCARDIOGRAM (TEE);  Surgeon: Josue Hector, MD;  Location: [facility] ENDOSCOPY;  Service: Cardiovascular;  Laterality: N/A;   TONSILLECTOMY     "I was little"   TRANSTHORACIC ECHOCARDIOGRAM  12/07/14   Normal LV size/fxn, EF 60%, normal valves.   URETEROSCOPY WITH HOLMIUM LASER LITHOTRIPSY Left 03/27/2019   Procedure: cysto with dilation of uretheral sticture, ureteroscopy and left retrograde pyelogram.;  Surgeon: Irine Seal, MD;  [facility];  Service: Urology;  Laterality: Left;    Current Medications: Current Meds  Medication Sig   acetaminophen (TYLENOL) 500 MG tablet Take 1 tablet (500 mg total) by mouth every 6 (six) hours as needed for headache.   albuterol (PROVENTIL HFA;VENTOLIN HFA) 108 (90 BASE) MCG/ACT inhaler Inhale 2 puffs into the lungs  every 6 (six) hours as needed for wheezing or shortness of breath.   Cholecalciferol (VITAMIN D3) 50 MCG (2000 UT) TABS Take 2,000 Units by mouth daily.   docusate sodium (COLACE) 100 MG capsule Take 100 mg by mouth 2 (two) times daily.    famotidine (PEPCID) 20 MG tablet Take 20 mg by mouth daily before breakfast.    HYDROcodone-acetaminophen (NORCO) 10-325 MG per tablet Take 1 tablet by mouth every 8 (eight) hours as needed (for pain).    metoprolol tartrate (LOPRESSOR) 25 MG tablet TAKE 1 TABLET BY MOUTH TWICE A DAY   nitroGLYCERIN (NITROSTAT) 0.4 MG SL tablet Place 1 tablet (0.4 mg total) under the tongue every 5 (five) minutes as needed for chest pain (CP or SOB).   oxcarbazepine (TRILEPTAL) 600 MG tablet TAKE ONE TABLET BY MOUTH TWICE DAILY   rosuvastatin (CRESTOR) 40 MG tablet Take 1 tablet (40 mg total) by mouth daily.   [DISCONTINUED] KLOR-CON M20 20 MEQ tablet Take 1 tablet (20 mEq total) by mouth daily.    [DISCONTINUED] torsemide (DEMADEX) 20 MG tablet Take 1 tablet (20 mg total) by mouth daily.     Allergies:    Brilinta [ticagrelor], Tramadol, Atorvastatin, Ezetimibe, and Morphine and related   Social History: Social History   Socioeconomic History   Marital status: Married    Spouse name: [name]   Number of children: 2   Years of education: 10 th   Highest education level: Not on file  Occupational History   Occupation: retired     Fish farm manager: RETIRED    Comment: retired  Tobacco Use   Smoking status: Former    Packs/day: 3.00    Years: 27.00    Total pack years: 81.00    Types: Cigarettes    Quit date: 02/08/2001    Years since quitting: 20.4   Smokeless tobacco: Never  Vaping Use   Vaping Use: Never used  Substance and Sexual Activity   Alcohol use: Yes    Alcohol/week: 7.0 standard drinks of alcohol    Types: 7 Cans of beer per week    Comment: 12 oz beer    Drug use: No   Sexual activity: Yes  Other Topics Concern   Not on file  Social History Narrative   Patient lives at home with his wife [name]).   Two adult children.   Retired from [employer] approx age 52 due to medical reasons (disabled due to memory issues, seizure d/o and chronic LBP).   Education 10th grade.   Right handed.   Caffeine two cups of tea and two cups of coffee.   Tobacco 45 pack-yr hx, quit approx age 48.  Alcohol: 1-2 beers per night.     No hx of alc or drug problems.   No exercise.   Diet: regular   Social Determinants of Health   Financial Resource Strain: Not on file  Food Insecurity: Not on file  Transportation Needs: Not on file  Physical Activity: Not on file  Stress: Not on file  Social Connections: Not on file     Family History: The patient's family history includes Asthma in his father; Cancer in his father; Heart attack in his paternal uncle; Hyperlipidemia in his mother; Hypertension in his father. There is no history of Stroke.  ROS:   All other ROS reviewed and  negative. Pertinent positives noted in the HPI.     EKGs/Labs/Other Studies Reviewed:   The following studies were personally reviewed by me today:  Recent  Labs: 05/18/2021: BUN 11; Creatinine, Ser 0.91; Hemoglobin 16.2; Platelets 198; Potassium 4.3; Sodium 138   Recent Lipid Panel    Component Value Date/Time   CHOL 149 02/06/2016 0749   TRIG 99 02/06/2016 0749   HDL 43 02/06/2016 0749   CHOLHDL 3.5 02/06/2016 0749   VLDL 20 02/06/2016 0749   LDLCALC 86 02/06/2016 0749    Physical Exam:   VS:  BP 120/73   Pulse (!) 52   Ht '5\' 11"'$  (1.803 m)   Wt 256 lb 9.6 oz (116.4 kg)   SpO2 93%   BMI 35.79 kg/m    Wt Readings from Last 3 Encounters:  07/16/21 256 lb 9.6 oz (116.4 kg)  05/18/21 256 lb 3.2 oz (116.2 kg)  04/28/21 250 lb (113.4 kg)    General: Well nourished, well developed, in no acute distress Head: Atraumatic, normal size  Eyes: PEERLA, EOMI  Neck: Supple, no JVD Endocrine: No thryomegaly Cardiac: Normal S1, S2; RRR; no murmurs, rubs, or gallops Lungs: Clear to auscultation bilaterally, no wheezing, rhonchi or rales  Abd: Soft, nontender, no hepatomegaly  Ext: No edema, pulses 2+ Musculoskeletal: No deformities, BUE and BLE strength normal and equal Skin: Warm and dry, no rashes   Neuro: Alert and oriented to person, place, time, and situation, CNII-XII grossly intact, no focal deficits  Psych: Normal mood and affect   ASSESSMENT:   Peter Barnett is a 61 y.o. male who presents for the following: 1. Persistent atrial fibrillation ([place])   2. Presence of Watchman left atrial appendage closure device   3. Coronary artery disease involving native coronary artery of native heart without angina pectoris   4. Mixed hyperlipidemia   5. Essential hypertension     PLAN:   1. Persistent atrial fibrillation ([place]) 2. Presence of Watchman left atrial appendage closure device -Status post A-fib ablation.  No recurrence.  Status post Watchman procedure.  20 mm watchman  flex device.  No leak.  Off anticoagulation.  Seems to be doing well.  I encouraged him to remain active and to lose weight.  3. Coronary artery disease involving native coronary artery of native heart without angina pectoris 4. Mixed hyperlipidemia -History of multiple PCI's as well as subtotaled distal RCA with collateral flow.  No symptoms of angina.  Echo shows normal LV function.  Currently not on aspirin due to history of intracerebral hemorrhage.  Cholesterol level not at goal.  On Crestor 40 mg every other day.  Cannot tolerate Zetia.  We discussed referral for PCSK9 inhibitor therapy.  We will do this today.  He is interested.  Goal LDL cholesterol less than 50.  5. Essential hypertension -Controlled in office.  Refill torsemide.  Encouraged to remain active.  Weight loss also encouraged.  Disposition: Return in about 6 months (around 01/15/2022).  Medication Adjustments/Labs and Tests Ordered: Current medicines are reviewed at length with the patient today.  Concerns regarding medicines are outlined above.  Orders Placed This Encounter  Procedures   AMB Referral to [facility]   Meds ordered this encounter  Medications   torsemide (DEMADEX) 20 MG tablet    Sig: Take 1 tablet (20 mg total) by mouth daily.    Dispense:  90 tablet    Refill:  3   KLOR-CON M20 20 MEQ tablet    Sig: Take 1 tablet (20 mEq total) by mouth daily.    Dispense:  90 tablet    Refill:  1    Patient Instructions  Medication  Instructions:  The current medical regimen is effective;  continue present plan and medications.  *If you need a refill on your cardiac medications before your next appointment, please call your pharmacy*   Follow-Up: At [facility], you and your health needs are our priority.  As part of our continuing mission to provide you with exceptional heart care, we have created designated Provider Care Teams.  These Care Teams include your primary Cardiologist (physician) and  Advanced Practice Providers (APPs -  Physician Assistants and Nurse Practitioners) who all work together to provide you with the care you need, when you need it.  We recommend signing up for the patient portal called "MyChart".  Sign up information is provided on this After Visit Summary.  MyChart is used to connect with patients for Virtual Visits (Telemedicine).  Patients are able to view lab/test results, encounter notes, upcoming appointments, etc.  Non-urgent messages can be sent to your provider as well.   To learn more about what you can do with MyChart, go to [email or web address].    Your next appointment:   6 month(s)  The format for your next appointment:   In Person  Provider:   Eleonore Chiquito, MD  Referral sent to pharmacy to discuss medications- they will be in touch.           Time Spent with Patient: I have spent a total of 35 minutes with patient reviewing hospital notes, telemetry, EKGs, labs and examining the patient as well as establishing an assessment and plan that was discussed with the patient.  > 50% of time was spent in direct patient care.  Signed, Addison Naegeli. [name], MD, [place]  [address], [place], [place] [address] [phone]  07/16/2021 8:57 AM

## 2021-07-16 ENCOUNTER — Ambulatory Visit (INDEPENDENT_AMBULATORY_CARE_PROVIDER_SITE_OTHER): Payer: Medicare HMO | Admitting: Cardiovascular Disease

## 2021-07-16 ENCOUNTER — Encounter: Payer: Self-pay | Admitting: Cardiovascular Disease

## 2021-07-16 VITALS — BP 120/73 | HR 52 | Ht 71.0 in | Wt 256.6 lb

## 2021-07-16 DIAGNOSIS — I2510 Atherosclerotic heart disease of native coronary artery without angina pectoris: Secondary | ICD-10-CM | POA: Diagnosis not present

## 2021-07-16 DIAGNOSIS — Z95818 Presence of other cardiac implants and grafts: Secondary | ICD-10-CM

## 2021-07-16 DIAGNOSIS — E782 Mixed hyperlipidemia: Secondary | ICD-10-CM

## 2021-07-16 DIAGNOSIS — I4819 Other persistent atrial fibrillation: Secondary | ICD-10-CM | POA: Diagnosis not present

## 2021-07-16 DIAGNOSIS — I10 Essential (primary) hypertension: Secondary | ICD-10-CM

## 2021-07-16 MED ORDER — TORSEMIDE 20 MG PO TABS: 20.0000 mg | ORAL_TABLET | Freq: Every day | ORAL | 3 refills | Status: DC

## 2021-07-16 MED ORDER — KLOR-CON M20 20 MEQ PO TBCR: 20.0000 meq | EXTENDED_RELEASE_TABLET | Freq: Every day | ORAL | 1 refills | Status: DC

## 2021-07-16 NOTE — Patient Instructions (Signed)
Medication Instructions:  The current medical regimen is effective;  continue present plan and medications.  *If you need a refill on your cardiac medications before your next appointment, please call your pharmacy*   Follow-Up: At [facility], you and your health needs are our priority.  As part of our continuing mission to provide you with exceptional heart care, we have created designated Provider Care Teams.  These Care Teams include your primary Cardiologist (physician) and Advanced Practice Providers (APPs -  Physician Assistants and Nurse Practitioners) who all work together to provide you with the care you need, when you need it.  We recommend signing up for the patient portal called "MyChart".  Sign up information is provided on this After Visit Summary.  MyChart is used to connect with patients for Virtual Visits (Telemedicine).  Patients are able to view lab/test results, encounter notes, upcoming appointments, etc.  Non-urgent messages can be sent to your provider as well.   To learn more about what you can do with MyChart, go to [email or web address].    Your next appointment:   6 month(s)  The format for your next appointment:   In Person  Provider:   Eleonore Chiquito, MD  Referral sent to pharmacy to discuss medications- they will be in touch.

## 2021-07-28 ENCOUNTER — Encounter: Payer: Self-pay | Admitting: Pharmacist

## 2021-07-28 ENCOUNTER — Ambulatory Visit (INDEPENDENT_AMBULATORY_CARE_PROVIDER_SITE_OTHER): Payer: Medicare HMO | Admitting: Pharmacist

## 2021-07-28 DIAGNOSIS — M791 Myalgia, unspecified site: Secondary | ICD-10-CM | POA: Diagnosis not present

## 2021-07-28 DIAGNOSIS — T466X5A Adverse effect of antihyperlipidemic and antiarteriosclerotic drugs, initial encounter: Secondary | ICD-10-CM

## 2021-07-28 DIAGNOSIS — I252 Old myocardial infarction: Secondary | ICD-10-CM | POA: Diagnosis not present

## 2021-07-28 DIAGNOSIS — I2510 Atherosclerotic heart disease of native coronary artery without angina pectoris: Secondary | ICD-10-CM

## 2021-07-28 NOTE — Patient Instructions (Addendum)
It was nice meeting you 2 today  We would like your LDL (bad cholesterol) to be less than 50  We will start a new medication called Repatha which you will inject once every 2 weeks  I will complete the prior authorization for you in July and contact you when it is approved  Once you start the medication we will recheck your cholesterol in 2-3 months  Please call with any questions  Karren Cobble, PharmD, [place], [place], [place] [facility], [address] [place], [place], [address] Phone: [phone], Fax: [phone]

## 2021-07-28 NOTE — Progress Notes (Signed)
Patient ID: Peter Barnett                 DOB: 04-Jan-1961                    MRN: [id number]     HPI: Peter Barnett is a 61 y.o. male patient referred to lipid clinic by [place]. PMH is significant for HTN, MI, CAD, A fib CHF, and HLD.  Patient is status post Watchman procedure.  Patient presents today with wife. Has tried multiple statins in the past plus ezetimibe and all have caused muscle pain. Has tried atorvastatin and simvastatin and is currently taking rosuvastatin '40mg'$  every other day but it still causes him muscle pain. Wife handles his medications.  Is not very physically active other than yard work. Former tobacco user.  Current Medications:  Rosuvastatin '40mg'$  every other day  Intolerances:  Atorvastatin Ezetimibe Simvastatin  Risk Factors:  CAD NSTEMI CHF T2DM? (A1c of 6.6 in record) History of smoking  LDL goal: <50  Labs:  HDL 41, Trigs 210, LDL 105, TC 182 (on rosuvastatin 40 every other day)  Past Medical History:  Diagnosis Date   Atypical mole    CAD (coronary artery disease)    a. MI/DES to Cx 2009 with staged PCI of LAD with DES and distal RCA with BMS. b. NSTEMI 12/2015 s/p DES to D1, known occlusion of dRCA tx medically.   Chronic lower back pain    Still with recurrent left sided LBP with paresthesias down left leg --primarily with activities: pain pill use is avg <90 tabs per mo   COPD (chronic obstructive pulmonary disease) ([place])    NOTED on CT chest 11/2013 done for lung ca screening.  Hosp for acute exac 11/2014.  Spirometry not supportive of this dx as of pulm eval 2017, though.  Spireva trial by Dr. Elsworth Soho 02/2015.   GERD (gastroesophageal reflux disease)    Heart attack ([place]) 12/12/2015   History of adenomatous polyp of colon 02/2009;02/2014   Recall 5 yrs ([facility])   History of kidney stones    HTN (hypertension)    Hyperlipidemia    Intracranial hemorrhage ([place]) 12/27/14   small acute hemorrhage in gliotic medial R  frontal lobe   Memory loss    MI (myocardial infarction) ([place]) 2009   "just had arm pain; never any chest pain"   Migraine syndrome 10/07/2011    a couple per month usually, but worsening 2017 so Dr. 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  Complex partial sz d/o.   Short-term memory loss 10/07/2011   "post brain OR & from his seizures"--worsening 2017, Dr. Krista Blue in neurology doing w/u.   Stroke [facility]) 12/2014   Tobacco abuse    Urethral stricture since 1981   Recurrent dilation has been required [facility] urology)    Current Outpatient Medications on File Prior to Visit  Medication Sig Dispense Refill   acetaminophen (TYLENOL) 500 MG tablet Take 1 tablet (500 mg total) by mouth every 6 (six) hours as needed for headache. 30 tablet 0   albuterol (PROVENTIL HFA;VENTOLIN HFA) 108 (90 BASE) MCG/ACT inhaler Inhale 2 puffs into the lungs every 6 (six) hours as needed for wheezing or shortness of breath. 1 Inhaler 2   Cholecalciferol (VITAMIN D3) 50 MCG (2000 UT) TABS Take 2,000 Units by mouth daily.     docusate sodium (COLACE) 100  MG capsule Take 100 mg by mouth 2 (two) times daily.      famotidine (PEPCID) 20 MG tablet Take 20 mg by mouth daily before breakfast.      HYDROcodone-acetaminophen (NORCO) 10-325 MG per tablet Take 1 tablet by mouth every 8 (eight) hours as needed (for pain).      KLOR-CON M20 20 MEQ tablet Take 1 tablet (20 mEq total) by mouth daily. 90 tablet 1   metoprolol tartrate (LOPRESSOR) 25 MG tablet TAKE 1 TABLET BY MOUTH TWICE A DAY 180 tablet 3   nitroGLYCERIN (NITROSTAT) 0.4 MG SL tablet Place 1 tablet (0.4 mg total) under the tongue every 5 (five) minutes as needed for chest pain (CP or SOB). 25 tablet 3   oxcarbazepine (TRILEPTAL) 600 MG tablet TAKE ONE TABLET BY MOUTH TWICE DAILY 180 tablet 0   rosuvastatin (CRESTOR) 40 MG tablet Take 1 tablet (40 mg total) by mouth daily. 15 tablet 0   torsemide  (DEMADEX) 20 MG tablet Take 1 tablet (20 mg total) by mouth daily. 90 tablet 3   No current facility-administered medications on file prior to visit.    Allergies  Allergen Reactions   Brilinta [Ticagrelor] Other (See Comments)    HISTORY OF A BRAIN BLEED- was told by an MD to not take    Tramadol Other (See Comments)    SEIZURES and "Staring off spells"   Atorvastatin Other (See Comments)    MYALGIAS   Ezetimibe Other (See Comments)    Muscle pain   Morphine And Related Nausea And Vomiting    Assessment/Plan:  1. Hyperlipidemia - Patient LDL 106 which is above goal of <50 despite rosuvastatin '40mg'$  every other day. This is however causing him muscle pain and he would like to stop. Recommended we start PCSK9i. Insurance plan prefers Praluent until July 1 when plan changes formularies and will prefer Repatha.  Advised medications were very similar and administered the same way.  Using demo pen educated patient on mechanism of action, storage, site selection, administration, and possible adverse effects. Will complete prior authorization and contact patient when approved. Recheck lipid panel in 2-3 months. Patient and wife prefer to wait until July to submit PA so they do not have to switch medications abruptly.    Continue rosuvastatin '40mg'$  every other day In July start Repatha '140mg'$  sq q 14 days Recheck lipid panel in 2-3 months  Karren Cobble, PharmD, [place], [place], [place], [place], [place], [address] Phone: [phone], Fax: [phone]

## 2021-08-12 ENCOUNTER — Telehealth: Payer: Self-pay | Admitting: Pharmacist

## 2021-08-12 DIAGNOSIS — I214 Non-ST elevation (NSTEMI) myocardial infarction: Secondary | ICD-10-CM

## 2021-08-12 DIAGNOSIS — I2510 Atherosclerotic heart disease of native coronary artery without angina pectoris: Secondary | ICD-10-CM

## 2021-08-12 DIAGNOSIS — I252 Old myocardial infarction: Secondary | ICD-10-CM

## 2021-08-12 MED ORDER — PRALUENT 75 MG/ML ~~LOC~~ SOAJ: 1.0000 mL | SUBCUTANEOUS | 3 refills | Status: DC

## 2021-08-12 NOTE — Addendum Note (Signed)
Addended by: Rollen Sox on: 08/12/2021 08:30 AM   Modules accepted: Orders

## 2021-08-12 NOTE — Telephone Encounter (Signed)
Contacted patient and he is aware.  Lipid panel scheduled for 2-3 months

## 2021-08-12 NOTE — Telephone Encounter (Signed)
PA for Praluent submitted. Key:  [id number].  Approved through 02/07/22.

## 2021-08-13 DIAGNOSIS — I10 Essential (primary) hypertension: Secondary | ICD-10-CM | POA: Diagnosis not present

## 2021-08-13 DIAGNOSIS — G40909 Epilepsy, unspecified, not intractable, without status epilepticus: Secondary | ICD-10-CM | POA: Diagnosis not present

## 2021-08-13 DIAGNOSIS — Z8679 Personal history of other diseases of the circulatory system: Secondary | ICD-10-CM | POA: Diagnosis not present

## 2021-08-13 DIAGNOSIS — R69 Illness, unspecified: Secondary | ICD-10-CM | POA: Diagnosis not present

## 2021-08-19 ENCOUNTER — Encounter: Payer: Self-pay | Admitting: Podiatry

## 2021-08-19 ENCOUNTER — Ambulatory Visit: Payer: Medicare HMO | Admitting: Podiatry

## 2021-08-19 DIAGNOSIS — M722 Plantar fascial fibromatosis: Secondary | ICD-10-CM

## 2021-08-19 MED ORDER — TRIAMCINOLONE ACETONIDE 10 MG/ML IJ SUSP
10.0000 mg | Freq: Once | INTRAMUSCULAR | Status: AC
  Administered 2021-08-19: 10 mg

## 2021-08-19 NOTE — Progress Notes (Signed)
Subjective:   Patient ID: Peter Barnett, male   DOB: 61 y.o.   MRN: [id number]   HPI Patient presents with pain in the left heel that is been severe.  States it started over the last month and has been very hard to walk on   ROS      Objective:  Physical Exam  Neurovascular status intact muscle strength found to be adequate range of motion adequate with the patient point tendon calcaneus with inflammation fluid of the medial band.  Patient is noted to have good digital perfusion well oriented and pain is worse when he gets up in the morning and after periods of sitting     Assessment:  Appears to be an acute fasciitis condition more related to inflammatory condition with fluid buildup around the medial band      Plan:  H&P reviewed condition and went ahead today and advised this patient on continued shoe gear usage with support ice therapy and I dispensed ice packs I did dispense a night splint fitting it properly for him today and I want him to start using this and also I did go ahead and I reinjected the fascia and 3 mg Kenalog 5 mg Xylocaine at the insertion.  Patient will be seen back as symptoms indicate  Due to intensity of discomfort I did go ahead today and I did do a precautionary x-ray I did not note any sign of a stress fracture associated with the acute pain that he is experiencing along with the swelling even though it still possible or not seen it yet on a normal x-ray

## 2021-09-15 DIAGNOSIS — T466X5A Adverse effect of antihyperlipidemic and antiarteriosclerotic drugs, initial encounter: Secondary | ICD-10-CM | POA: Insufficient documentation

## 2021-09-21 DIAGNOSIS — I2510 Atherosclerotic heart disease of native coronary artery without angina pectoris: Secondary | ICD-10-CM | POA: Diagnosis not present

## 2021-09-21 DIAGNOSIS — Z0000 Encounter for general adult medical examination without abnormal findings: Secondary | ICD-10-CM | POA: Diagnosis not present

## 2021-09-21 DIAGNOSIS — I10 Essential (primary) hypertension: Secondary | ICD-10-CM | POA: Diagnosis not present

## 2021-09-21 DIAGNOSIS — I5032 Chronic diastolic (congestive) heart failure: Secondary | ICD-10-CM | POA: Diagnosis not present

## 2021-09-21 DIAGNOSIS — I480 Paroxysmal atrial fibrillation: Secondary | ICD-10-CM | POA: Diagnosis not present

## 2021-09-21 DIAGNOSIS — J439 Emphysema, unspecified: Secondary | ICD-10-CM | POA: Diagnosis not present

## 2021-09-21 DIAGNOSIS — R609 Edema, unspecified: Secondary | ICD-10-CM | POA: Diagnosis not present

## 2021-09-21 DIAGNOSIS — M5442 Lumbago with sciatica, left side: Secondary | ICD-10-CM | POA: Diagnosis not present

## 2021-09-21 DIAGNOSIS — G8929 Other chronic pain: Secondary | ICD-10-CM | POA: Diagnosis not present

## 2021-09-21 DIAGNOSIS — I252 Old myocardial infarction: Secondary | ICD-10-CM | POA: Diagnosis not present

## 2021-09-21 DIAGNOSIS — R69 Illness, unspecified: Secondary | ICD-10-CM | POA: Diagnosis not present

## 2021-09-21 DIAGNOSIS — E1165 Type 2 diabetes mellitus with hyperglycemia: Secondary | ICD-10-CM | POA: Insufficient documentation

## 2021-09-21 DIAGNOSIS — E782 Mixed hyperlipidemia: Secondary | ICD-10-CM | POA: Diagnosis not present

## 2021-09-25 ENCOUNTER — Encounter: Payer: Self-pay | Admitting: Cardiovascular Disease

## 2021-10-23 ENCOUNTER — Other Ambulatory Visit: Payer: Self-pay

## 2021-10-23 DIAGNOSIS — I252 Old myocardial infarction: Secondary | ICD-10-CM | POA: Diagnosis not present

## 2021-10-23 DIAGNOSIS — I214 Non-ST elevation (NSTEMI) myocardial infarction: Secondary | ICD-10-CM | POA: Diagnosis not present

## 2021-10-23 DIAGNOSIS — I2510 Atherosclerotic heart disease of native coronary artery without angina pectoris: Secondary | ICD-10-CM | POA: Diagnosis not present

## 2021-10-24 LAB — LIPID PANEL
Chol/HDL Ratio: 2.8 ratio (ref 0.0–5.0)
Cholesterol, Total: 120 mg/dL (ref 100–199)
HDL: 43 mg/dL (ref >39)
LDL Chol Calc (NIH): 54 mg/dL (ref 0–99)
Triglycerides: 131 mg/dL (ref 0–149)
VLDL Cholesterol Cal: 23 mg/dL (ref 5–40)

## 2021-10-26 ENCOUNTER — Telehealth: Payer: Self-pay | Admitting: *Deleted

## 2021-10-26 ENCOUNTER — Encounter: Payer: Self-pay | Admitting: *Deleted

## 2021-10-26 NOTE — Patient Outreach (Signed)
  Care Coordination   Initial Visit Note   10/26/2021 Name: Peter Barnett MRN: [id number] DOB: 02/19/1960  Peter Barnett is a 61 y.o. year old male who sees Corrington, Kip A, MD for primary care. I spoke with  Peter Barnett by phone today.  What matters to the patients health and wellness today?  "I need help with affording medications"    Goals Addressed             This Visit's Progress    COMPLETED: "I need help with affording medications"       Care Coordination Interventions: Patient interviewed about adult health maintenance status including  verified patient had Annual Wellness Visit on 09/21/21.  Patient reports he cannot always afford his medications and if he gets the Alirocumb he will be in the donut hole and cannot afford the others, pt requests pharmacy referral Provided education about importance of taking medications as prescribed, adhering to heart healthy diet Pain assessment completed, reviewed importance of keeping pain under control and reporting any new symptoms to doctor [facility] care coordination program explained to patient who is agreeable to today's outreach but declines any further outreach from nurse, pt feels he is managing well, agreeable to pharmacy referral. RN care manager placed order for pharmacy referral          SDOH assessments and interventions completed:  Yes  SDOH Interventions Today    Flowsheet Row Most Recent Value  SDOH Interventions   Food Insecurity Interventions Intervention Not Indicated  Housing Interventions Intervention Not Indicated  Transportation Interventions Intervention Not Indicated        Care Coordination Interventions Activated:  Yes  Care Coordination Interventions:  Yes, provided   Follow up plan: Referral made to pharmacist    Encounter Outcome:  Pt. Visit Completed   Jacqlyn Larsen [facility], BSN [facility] RN Care Coordinator [phone]

## 2021-10-27 NOTE — Telephone Encounter (Signed)
Peter Barnett referral to [facility]  Direct Dial: [phone]

## 2021-10-29 ENCOUNTER — Telehealth: Payer: Self-pay

## 2021-10-29 NOTE — Progress Notes (Signed)
[facility]) Care Management  [place]   10/29/2021  Peter Barnett [birth year] [id number]  Reason for referral: Medication Assistance  Referral source: [place] Management RN Referral medication(s): Praluent Current insurance:[employer] [place]   [place] Team received medication assistance referral for Peter Barnett. Per discussion with patient, he is one Praluent prescription refill away from going into the Medicare Coverage Gap. Given his reported income, patient does not qualify for Praluent's Patient Assistance Program. However, he qualifies for [employer] and [employer] grant programs but enrollment is not open. Patient was signed up for the waitlists and instructed to contact this pharmacist when enrollment opens.   Patient has tried and failed several statins as well as Zetia.     Medication Assistance Findings:  Medication assistance needs identified: Praluent      Additional medication assistance options reviewed with patient as warranted:  Foundation programs  Plan: I will route this note to provider and clinical pharmacist at lipid clinic to assess for cost effective alternative (if any) while patient is on grant waitlist.   I will help assist patient with applying for grant program when grant is available.   Thank you for allowing pharmacy to be a part of this patient's care.  Kristeen Miss, PharmD Clinical Pharmacist [place] Cell: [phone]

## 2021-10-29 NOTE — Progress Notes (Signed)
Open in error

## 2021-11-13 DIAGNOSIS — H526 Other disorders of refraction: Secondary | ICD-10-CM | POA: Diagnosis not present

## 2021-11-13 DIAGNOSIS — H43813 Vitreous degeneration, bilateral: Secondary | ICD-10-CM | POA: Diagnosis not present

## 2021-11-13 DIAGNOSIS — H40013 Open angle with borderline findings, low risk, bilateral: Secondary | ICD-10-CM | POA: Diagnosis not present

## 2021-11-13 DIAGNOSIS — H25813 Combined forms of age-related cataract, bilateral: Secondary | ICD-10-CM | POA: Diagnosis not present

## 2021-11-13 DIAGNOSIS — E119 Type 2 diabetes mellitus without complications: Secondary | ICD-10-CM | POA: Diagnosis not present

## 2021-11-13 DIAGNOSIS — H524 Presbyopia: Secondary | ICD-10-CM | POA: Diagnosis not present

## 2021-12-29 DIAGNOSIS — G8929 Other chronic pain: Secondary | ICD-10-CM | POA: Diagnosis not present

## 2021-12-29 DIAGNOSIS — M5442 Lumbago with sciatica, left side: Secondary | ICD-10-CM | POA: Diagnosis not present

## 2021-12-29 DIAGNOSIS — R69 Illness, unspecified: Secondary | ICD-10-CM | POA: Diagnosis not present

## 2021-12-29 DIAGNOSIS — I10 Essential (primary) hypertension: Secondary | ICD-10-CM | POA: Diagnosis not present

## 2021-12-29 DIAGNOSIS — F1120 Opioid dependence, uncomplicated: Secondary | ICD-10-CM | POA: Diagnosis not present

## 2022-01-15 ENCOUNTER — Encounter: Payer: Self-pay | Admitting: Podiatry

## 2022-01-15 ENCOUNTER — Ambulatory Visit: Payer: Medicare HMO | Admitting: Podiatry

## 2022-01-15 DIAGNOSIS — M722 Plantar fascial fibromatosis: Secondary | ICD-10-CM

## 2022-01-15 MED ORDER — TRIAMCINOLONE ACETONIDE 10 MG/ML IJ SUSP
10.0000 mg | Freq: Once | INTRAMUSCULAR | Status: AC
  Administered 2022-01-15: 10 mg

## 2022-01-17 ENCOUNTER — Other Ambulatory Visit: Payer: Self-pay | Admitting: Cardiovascular Disease

## 2022-01-17 NOTE — Progress Notes (Signed)
Subjective:   Patient ID: Peter Barnett, male   DOB: 61 y.o.   MRN: [id number]   HPI Patient presents with severe pain in his left heel states its been very hard to walk on and states that he feels like its unstable.  States the entire bottom of the heel gets very sore   ROS      Objective:  Physical Exam  Neuro vascular status intact with exquisite discomfort left plantar fascia into the middle band slightly into the bone itself but mostly plantar within the fascia     Assessment:  Acute plantar fasciitis left very painful with patient having inability to walk with any degree of comfort on the foot     Plan:  H&P reviewed condition went ahead today did sterile prep injected the fascia left 3 mg Kenalog 5 mg Xylocaine and then went ahead and applied air fracture walker to completely immobilize with air fracture walker being fitted properly to the lower leg and all instructions given for utilization  X-rays indicate small spur no indications of stress fracture or arthritis

## 2022-01-24 ENCOUNTER — Other Ambulatory Visit: Payer: Self-pay | Admitting: Interventional Cardiology

## 2022-01-25 ENCOUNTER — Telehealth: Payer: Self-pay | Admitting: *Deleted

## 2022-01-25 ENCOUNTER — Other Ambulatory Visit: Payer: Self-pay | Admitting: Interventional Cardiology

## 2022-01-25 MED ORDER — ROSUVASTATIN CALCIUM 40 MG PO TABS: 40.0000 mg | ORAL_TABLET | Freq: Every day | ORAL | 0 refills | Status: DC

## 2022-01-25 NOTE — Telephone Encounter (Signed)
Can take ibuprophen or tylenol

## 2022-01-25 NOTE — Telephone Encounter (Signed)
Patient is calling to request something for foot pain after injection, never did stop the pain completely, is now having problems with walking, following instructions and wearing the boot,is taking the hydrocodone prescribed by another physician but does not want to take the medication often.

## 2022-01-26 NOTE — Telephone Encounter (Signed)
Called patient, no answer, left voice message per physician's recommendations.

## 2022-02-15 ENCOUNTER — Encounter: Payer: Self-pay | Admitting: Podiatry

## 2022-02-15 ENCOUNTER — Ambulatory Visit: Payer: Medicare HMO | Admitting: Podiatry

## 2022-02-15 VITALS — BP 130/70

## 2022-02-15 DIAGNOSIS — M722 Plantar fascial fibromatosis: Secondary | ICD-10-CM | POA: Diagnosis not present

## 2022-02-15 MED ORDER — PREDNISONE 10 MG PO TABS: ORAL_TABLET | ORAL | 0 refills | Status: AC

## 2022-02-15 MED ORDER — TRIAMCINOLONE ACETONIDE 10 MG/ML IJ SUSP
10.0000 mg | Freq: Once | INTRAMUSCULAR | Status: AC
  Administered 2022-02-15: 10 mg

## 2022-02-15 NOTE — Progress Notes (Signed)
Subjective:   Patient ID: Peter Barnett, male   DOB: 62 y.o.   MRN: [id number]   HPI Patient states he has had some improvement but still having a lot of discomfort in his left heel   ROS      Objective:  Physical Exam  Neurovascular status intact with discomfort and pain of the left plantar fascia at the insertional point of the tendon into the calcaneus     Assessment:  Acute plantar fasciitis left inflammation fluid around the medial band     Plan:  H&P reviewed condition and recommended trying still conservatively and placed him back onto a Sterapred DS 12-day Dosepak along with today doing sterile prep and reinjected the fascia 3 mg Kenalog 5 mg Xylocaine.  Continue boot usage reappoint 4 weeks may require surgery if symptoms do not come under control

## 2022-02-25 ENCOUNTER — Telehealth: Payer: Self-pay

## 2022-02-25 NOTE — Progress Notes (Signed)
[facility]) [place]   02/25/2022  JESE COMELLA 04-27-1960 [id number]  Reason for referral: Medication Assistance with Praluent   Referral source: [facility] RN Current insurance: [employer]  Reason for call: Fatima [place] application for [employer]:  Unsuccessful telephone call attempt #1 to patient.   HIPAA compliant voicemail left requesting a return call  Plan:  -I will make another outreach attempt to patient within 3-4 business days.  Thank you for allowing [facility] pharmacy to be a part of this patient's care.  Kristeen Miss, PharmD Clinical Pharmacist [place] Cell: [phone]

## 2022-03-02 ENCOUNTER — Telehealth: Payer: Self-pay

## 2022-03-02 NOTE — Progress Notes (Signed)
[place]) [place]   03/02/2022  Peter Barnett 12-08-1960 [id number]  Reason for referral: Medication Assistance with Praluent   Referral source: [facility] RN Current insurance: [employer] Falling  Reason for call: Fatima S[place] application for [employer]:  Unsuccessful telephone call attempt #2 to patient.   HIPAA compliant voicemail left requesting a return call  Plan:  -I will make another outreach attempt to patient within 3-4 business days.  Thank you for allowing [facility] pharmacy to be a part of this patient's care.  Kristeen Miss, PharmD Clinical Pharmacist [place] Cell: [phone]

## 2022-03-12 ENCOUNTER — Telehealth: Payer: Self-pay

## 2022-03-12 NOTE — Progress Notes (Signed)
[place] [facility]) [place]   03/12/2022  MARSH HECKLER [birth year] [id number]  Reason for referral: Medication Assistance with Praluent   Referral source:  [place] Management RN Current insurance: [employer] Falling  Reason for call: Update regarding grant [facility]) application   Outreach:  Unsuccessful telephone call attempt #3 (total) to patient.   HIPAA compliant voicemail left requesting a return call  Of note, I called and spoke with  Mr. Augello earlier this week regarding [place] application. There is an error in the system that does not allow the application to be completed online as a new patient and when you try to submit the application as an existing patient it indicates there are no records of this patient. Since that time, I have been trying to call the patient regarding an update and resolution to the issue. I have called the patient multiple times throughout this referral and it has been difficult to contact.   Plan:  -I will close [facility] pharmacy case at this time as I have been unable to establish and/or maintain contact with patient. -I am happy to assist in the future as needed.  Thank you for allowing [facility] pharmacy to be a part of this patient's care.  Kristeen Miss, PharmD Clinical Pharmacist [place] Cell: [phone]

## 2022-03-15 ENCOUNTER — Encounter: Payer: Self-pay | Admitting: Podiatry

## 2022-03-15 ENCOUNTER — Ambulatory Visit: Payer: Medicare HMO | Admitting: Podiatry

## 2022-03-15 DIAGNOSIS — M7662 Achilles tendinitis, left leg: Secondary | ICD-10-CM

## 2022-03-15 DIAGNOSIS — M722 Plantar fascial fibromatosis: Secondary | ICD-10-CM

## 2022-03-16 NOTE — Progress Notes (Signed)
Subjective:   Patient ID: Peter Barnett, male   DOB: 62 y.o.   MRN: [id number]   HPI Patient presents stating that his heel is improving with the boot and he still has been wearing it full-time.  States that there is still pain but not to the same degree but has not tested it   ROS      Objective:  Physical Exam  Neurovascular status intact with patient noted to have continued discomfort in the plantar fascia left with improvement but pain still noted upon deep palpation to the area     Assessment:  Improvement in fascial inflammation left with pain still present upon deep palpation     Plan:  Reviewed condition at great length.  At this point we are going to start reducing the boot with all instructions given to him currently and I did review with him the gradual reduction of boot usage and how to properly wear shoe gear and to ice the area and continue stretching exercises.  Patient's discharge will be seen back as needed

## 2022-04-07 DIAGNOSIS — E782 Mixed hyperlipidemia: Secondary | ICD-10-CM | POA: Diagnosis not present

## 2022-04-07 DIAGNOSIS — I10 Essential (primary) hypertension: Secondary | ICD-10-CM | POA: Diagnosis not present

## 2022-04-07 DIAGNOSIS — M5442 Lumbago with sciatica, left side: Secondary | ICD-10-CM | POA: Diagnosis not present

## 2022-04-07 DIAGNOSIS — G8929 Other chronic pain: Secondary | ICD-10-CM | POA: Diagnosis not present

## 2022-04-07 DIAGNOSIS — E119 Type 2 diabetes mellitus without complications: Secondary | ICD-10-CM | POA: Diagnosis not present

## 2022-04-07 DIAGNOSIS — Z1330 Encounter for screening examination for mental health and behavioral disorders, unspecified: Secondary | ICD-10-CM | POA: Diagnosis not present

## 2022-04-14 DIAGNOSIS — R69 Illness, unspecified: Secondary | ICD-10-CM | POA: Diagnosis not present

## 2022-04-14 DIAGNOSIS — J0100 Acute maxillary sinusitis, unspecified: Secondary | ICD-10-CM | POA: Diagnosis not present

## 2022-04-14 DIAGNOSIS — F1120 Opioid dependence, uncomplicated: Secondary | ICD-10-CM | POA: Diagnosis not present

## 2022-04-14 DIAGNOSIS — R051 Acute cough: Secondary | ICD-10-CM | POA: Diagnosis not present

## 2022-04-14 DIAGNOSIS — I10 Essential (primary) hypertension: Secondary | ICD-10-CM | POA: Diagnosis not present

## 2022-04-19 DIAGNOSIS — J029 Acute pharyngitis, unspecified: Secondary | ICD-10-CM | POA: Diagnosis not present

## 2022-04-19 DIAGNOSIS — R058 Other specified cough: Secondary | ICD-10-CM | POA: Diagnosis not present

## 2022-04-19 DIAGNOSIS — J069 Acute upper respiratory infection, unspecified: Secondary | ICD-10-CM | POA: Diagnosis not present

## 2022-04-19 DIAGNOSIS — I10 Essential (primary) hypertension: Secondary | ICD-10-CM | POA: Diagnosis not present

## 2022-04-19 DIAGNOSIS — B9689 Other specified bacterial agents as the cause of diseases classified elsewhere: Secondary | ICD-10-CM | POA: Diagnosis not present

## 2022-04-20 DIAGNOSIS — J069 Acute upper respiratory infection, unspecified: Secondary | ICD-10-CM | POA: Diagnosis not present

## 2022-04-20 DIAGNOSIS — R058 Other specified cough: Secondary | ICD-10-CM | POA: Diagnosis not present

## 2022-05-01 ENCOUNTER — Other Ambulatory Visit: Payer: Self-pay | Admitting: Cardiovascular Disease

## 2022-05-18 ENCOUNTER — Telehealth: Payer: Self-pay

## 2022-05-18 NOTE — Telephone Encounter (Signed)
Called to check in with patient, who had LAAO on 06/19/2020. The patient reports doing well with no issues.  Scheduled him for routine overdue follow-up with Dr. Flora Lipps in July 2024. He was grateful for assistance.

## 2022-05-18 NOTE — Telephone Encounter (Signed)
Called to check in with patient, who had LAAO on 06/19/2020.  Left message to call back. Will offer to schedule routine Cardiology follow-up.

## 2022-07-19 DIAGNOSIS — F1120 Opioid dependence, uncomplicated: Secondary | ICD-10-CM | POA: Diagnosis not present

## 2022-07-19 DIAGNOSIS — R600 Localized edema: Secondary | ICD-10-CM | POA: Diagnosis not present

## 2022-07-19 DIAGNOSIS — I10 Essential (primary) hypertension: Secondary | ICD-10-CM | POA: Diagnosis not present

## 2022-07-19 DIAGNOSIS — G8929 Other chronic pain: Secondary | ICD-10-CM | POA: Diagnosis not present

## 2022-07-19 DIAGNOSIS — M5442 Lumbago with sciatica, left side: Secondary | ICD-10-CM | POA: Diagnosis not present

## 2022-07-19 DIAGNOSIS — E1165 Type 2 diabetes mellitus with hyperglycemia: Secondary | ICD-10-CM | POA: Diagnosis not present

## 2022-07-19 DIAGNOSIS — E119 Type 2 diabetes mellitus without complications: Secondary | ICD-10-CM | POA: Diagnosis not present

## 2022-07-24 ENCOUNTER — Other Ambulatory Visit: Payer: Self-pay | Admitting: Interventional Cardiology

## 2022-07-30 ENCOUNTER — Other Ambulatory Visit: Payer: Self-pay | Admitting: Cardiovascular Disease

## 2022-08-06 NOTE — Progress Notes (Signed)
Cardiology Office Note:   Date:  08/16/2022  NAME:  Peter Barnett    MRN: [id number] DOB:  March 16, 1960   PCP:  Vivien Presto, MD  Cardiologist:  Reatha Harps, MD  Electrophysiologist:  Lanier Prude, MD   Referring MD: Vivien Presto, MD   Chief Complaint  Patient presents with   Follow-up    History of Present Illness:   Peter Barnett is a 62 y.o. male with a hx of CAD, hypertension, persistent atrial fibrillation status post ablation who presents for follow-up.  Referred to pharmacy for PCSK9 inhibitor.  He reports he is doing well.  Still having swelling in his left lower extremity.  He takes the torsemide and this improves.  He is unable to wear compression stockings.  We did discuss weight loss.  He likely has venous insufficiency.  He reports his A1c number increased on Praluent.  He is no longer taking a PCSK9 inhibitor.  He is on Crestor every other day.  Denies any chest pains or trouble breathing.  No regular exercise but doing lots of yard work.  Problem List 1. CAD -PCI to LCX/LAD 2009 -DES to D1 2017 -99% dRCA with L to R collaterals  2. ICH/CVA 2016 -R frontal AVM s/p craniotomy 1981 3. COPD/Tobacco abuse  4. HTN 5. HLD -T chol 120, HDL 43, LDL 54, TG 131 6. Persistent Afib/flutter -CHADSVASC = 2 (HTN, CAD) -Afib ablation 04/21/2020 -20 mm Watchman FLX 06/19/2020  Past Medical History: Past Medical History:  Diagnosis Date   Atypical mole    CAD (coronary artery disease)    a. MI/DES to Cx 2009 with staged PCI of LAD with DES and distal RCA with BMS. b. NSTEMI 12/2015 s/p DES to D1, known occlusion of dRCA tx medically.   Chronic lower back pain    Still with recurrent left sided LBP with paresthesias down left leg --primarily with activities: pain pill use is avg <90 tabs per mo   COPD (chronic obstructive pulmonary disease) (HCC)    NOTED on CT chest 11/2013 done for lung ca screening.  Hosp for acute exac 11/2014.  Spirometry not supportive of  this dx as of pulm eval 2017, though.  Spireva trial by Dr. Vassie Loll 02/2015.   GERD (gastroesophageal reflux disease)    Heart attack (HCC) 12/12/2015   History of adenomatous polyp of colon 02/2009;02/2014   Recall 5 yrs ([facility])   History of kidney stones    HTN (hypertension)    Hyperlipidemia    Intracranial hemorrhage (HCC) 12/27/14   small acute hemorrhage in gliotic medial R frontal lobe   Memory loss    MI (myocardial infarction) (HCC) 2009   "just had arm pain; never any chest pain"   Migraine syndrome 10/07/2011    a couple per month usually, but worsening 2017 so Dr. 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  Complex partial sz d/o.   Short-term memory loss 10/07/2011   "post brain OR & from his seizures"--worsening 2017, Dr. Terrace Arabia in neurology doing w/u.   Stroke [facility]) 12/2014   Tobacco abuse    Urethral stricture since 1981   Recurrent dilation has been required ([facility])    Past Surgical History: Past Surgical History:  Procedure Laterality Date   ATRIAL FIBRILLATION ABLATION N/A 04/21/2020   Procedure: ATRIAL FIBRILLATION ABLATION;  Surgeon: Lanier Prude, MD;  Location: [facility]  INVASIVE CV LAB;  Service: Cardiovascular;  Laterality: N/A;   BACK SURGERY  11/25/2015   CARDIAC CATHETERIZATION  2011   [facility], records unavailable: pt reports "normal".   CARDIAC CATHETERIZATION N/A 12/15/2015   Procedure: Left Heart Cath and Coronary Angiography;  Surgeon: Peter M Swaziland, MD;  Location[facility] INVASIVE CV LAB;  Service: Cardiovascular;  Laterality: N/A;   CARDIAC CATHETERIZATION N/A 12/15/2015   Procedure: Coronary Stent Intervention;  Surgeon: Peter M Swaziland, MD;  Location: [facility] INVASIVE CV LAB;  Service: Cardiovascular;  Laterality: N/A;   CARDIOVERSION N/A 06/21/2018   Procedure: CARDIOVERSION;  Surgeon: Elease Hashimoto Deloris Ping, MD;  Location: [facility] ENDOSCOPY;  Service: Cardiovascular;  Laterality:  N/A;   CARDIOVERSION N/A 01/14/2020   Procedure: CARDIOVERSION;  Surgeon: Jodelle Red, MD;  Location: [facility] ENDOSCOPY;  Service: Cardiovascular;  Laterality: N/A;   COLONOSCOPY W/ POLYPECTOMY  02/2009;02/2014   Recall 5 yr (aden poly, hyperplast polyp, int and ext hemorr).  Next recall is 02/2019.   CORONARY ANGIOPLASTY WITH STENT PLACEMENT  2009   3 DES to LCx; still with known distal RCA/PDA occlusion.  EF 60% by LV-gram.   CRANIOTOMY FOR AVM  1981   Dx'd after pt began having seizures.   CT ANGIOGRAM (CEREBRAL)  12/31/14   NORMAL--plan per neuro is to repeat this in 3 mo.  Pt states it was repeated Spring 2017 and was "fine"   CYSTOSCOPY WITH URETHRAL DILATATION N/A 03/27/2019   Procedure: CYSTOSCOPY WITH URETHRAL DILATATION;  Surgeon: Bjorn Pippin, MD;  Location: [facility] ORS;  Service: Urology;  Laterality: N/A;   LEFT ATRIAL APPENDAGE OCCLUSION N/A 06/19/2020   Procedure: LEFT ATRIAL APPENDAGE OCCLUSION;  Surgeon: Lanier Prude, MD;  Location: [facility] INVASIVE CV LAB;  Service: Cardiovascular;  Laterality: N/A;   LUMBAR DISC SURGERY  2008; 2009   Dr. Jeral Fruit   LUMBAR LAMINECTOMY/DECOMPRESSION MICRODISCECTOMY N/A 11/25/2015   Procedure: Lumbar three- four Lumbar four- five Laminectomy/Foraminotomy;  Surgeon: Hilda Lias, MD;  Location: [facility] OR;  Service: Neurosurgery;  Laterality: N/A;  L3-4 L4-5 Laminectomy/Foraminotomy/possible Lumbar Drain   TEE WITHOUT CARDIOVERSION N/A 06/21/2018   Procedure: TRANSESOPHAGEAL ECHOCARDIOGRAM (TEE);  Surgeon: Elease Hashimoto Deloris Ping, MD;  Location: [facility] ENDOSCOPY;  Service: Cardiovascular;  Laterality: N/A;   TEE WITHOUT CARDIOVERSION N/A 01/14/2020   Procedure: TRANSESOPHAGEAL ECHOCARDIOGRAM (TEE);  Surgeon: Jodelle Red, MD;  Location: [facility] ENDOSCOPY;  Service: Cardiovascular;  Laterality: N/A;   TEE WITHOUT CARDIOVERSION N/A 06/19/2020   Procedure: TRANSESOPHAGEAL ECHOCARDIOGRAM (TEE);  Surgeon: Lanier Prude, MD;  Location: [facility] INVASIVE CV LAB;   Service: Cardiovascular;  Laterality: N/A;   TEE WITHOUT CARDIOVERSION N/A 07/31/2020   Procedure: TRANSESOPHAGEAL ECHOCARDIOGRAM (TEE);  Surgeon: Wendall Stade, MD;  Location: [facility] ENDOSCOPY;  Service: Cardiovascular;  Laterality: N/A;   TONSILLECTOMY     "I was little"   TRANSTHORACIC ECHOCARDIOGRAM  12/07/14   Normal LV size/fxn, EF 60%, normal valves.   URETEROSCOPY WITH HOLMIUM LASER LITHOTRIPSY Left 03/27/2019   Procedure: cysto with dilation of uretheral sticture, ureteroscopy and left retrograde pyelogram.;  Surgeon: Bjorn Pippin, MD;  Location: [facility] ORS;  Service: Urology;  Laterality: Left;    Current Medications: Current Meds  Medication Sig   acetaminophen (TYLENOL) 500 MG tablet Take 1 tablet (500 mg total) by mouth every 6 (six) hours as needed for headache.   albuterol (PROVENTIL HFA;VENTOLIN HFA) 108 (90 BASE) MCG/ACT inhaler Inhale 2 puffs into the lungs every 6 (six) hours as needed for wheezing or shortness of breath.   Cholecalciferol (VITAMIN D3) 50  MCG (2000 UT) TABS Take 2,000 Units by mouth daily.   docusate sodium (COLACE) 100 MG capsule Take 100 mg by mouth 2 (two) times daily.    famotidine (PEPCID) 20 MG tablet Take 20 mg by mouth daily before breakfast.    HYDROcodone-acetaminophen (NORCO) 10-325 MG per tablet Take 1 tablet by mouth every 8 (eight) hours as needed (for pain).   metoprolol tartrate (LOPRESSOR) 25 MG tablet TAKE 1 TABLET BY MOUTH TWICE A DAY   oxcarbazepine (TRILEPTAL) 600 MG tablet TAKE ONE TABLET BY MOUTH TWICE DAILY   predniSONE (DELTASONE) 10 MG tablet 12 day tapering dose   [DISCONTINUED] KLOR-CON M20 20 MEQ tablet TAKE 1 TABLET BY MOUTH EVERY DAY   [DISCONTINUED] rosuvastatin (CRESTOR) 40 MG tablet TAKE 1 TABLET BY MOUTH EVERY DAY   [DISCONTINUED] torsemide (DEMADEX) 20 MG tablet TAKE 1 TABLET BY MOUTH EVERY DAY     Allergies:    Brilinta [ticagrelor], Tramadol, Atorvastatin, Ezetimibe, and Morphine and codeine   Social History: Social  History   Socioeconomic History   Marital status: Married    Spouse name: Kathie Rhodes   Number of children: 2   Years of education: 10 th   Highest education level: Not on file  Occupational History   Occupation: retired     Associate Professor: RETIRED    Comment: retired  Tobacco Use   Smoking status: Former    Packs/day: 3.00    Years: 27.00    Additional pack years: 0.00    Total pack years: 81.00    Types: Cigarettes    Quit date: 02/08/2001    Years since quitting: 21.5   Smokeless tobacco: Never  Vaping Use   Vaping Use: Never used  Substance and Sexual Activity   Alcohol use: Yes    Alcohol/week: 7.0 standard drinks of alcohol    Types: 7 Cans of beer per week    Comment: 12 oz beer    Drug use: No   Sexual activity: Yes  Other Topics Concern   Not on file  Social History Narrative   Patient lives at home with his wife Kathie Rhodes).   Two adult children.   Retired from [employer] approx age 24 due to medical reasons (disabled due to memory issues, seizure d/o and chronic LBP).   Education 10th grade.   Right handed.   Caffeine two cups of tea and two cups of coffee.   Tobacco 45 pack-yr hx, quit approx age 66.  Alcohol: 1-2 beers per night.     No hx of alc or drug problems.   No exercise.   Diet: regular   Social Determinants of Health   Financial Resource Strain: Not on file  Food Insecurity: No Food Insecurity (10/26/2021)   Hunger Vital Sign    Worried About Running Out of Food in the Last Year: Never true    Ran Out of Food in the Last Year: Never true  Transportation Needs: No Transportation Needs (10/26/2021)   PRAPARE - Administrator, Civil Service (Medical): No    Lack of Transportation (Non-Medical): No  Physical Activity: Not on file  Stress: Not on file  Social Connections: Not on file     Family History: The patient's family history includes Asthma in his father; Cancer in his father; Heart attack in his paternal uncle; Hyperlipidemia in his mother;  Hypertension in his father. There is no history of Stroke.  ROS:   All other ROS reviewed and negative. Pertinent positives noted in the  HPI.     EKGs/Labs/Other Studies Reviewed:   The following studies were personally reviewed by me today:  EKG:  EKG is ordered today.  SB 54 bpm, LAFB, RBBB, no acute changes.   Recent Labs: No results found for requested labs within last 365 days.   Recent Lipid Panel    Component Value Date/Time   CHOL 120 10/23/2021 1021   TRIG 131 10/23/2021 1021   HDL 43 10/23/2021 1021   CHOLHDL 2.8 10/23/2021 1021   CHOLHDL 3.5 02/06/2016 0749   VLDL 20 02/06/2016 0749   LDLCALC 54 10/23/2021 1021    Physical Exam:   VS:  BP 110/66   Pulse (!) 54   Ht 5\' 11"  (1.803 m)   Wt 251 lb 6.4 oz (114 kg)   SpO2 96%   BMI 35.06 kg/m    Wt Readings from Last 3 Encounters:  08/16/22 251 lb 6.4 oz (114 kg)  07/16/21 256 lb 9.6 oz (116.4 kg)  05/18/21 256 lb 3.2 oz (116.2 kg)    General: Well nourished, well developed, in no acute distress Head: Atraumatic, normal size  Eyes: PEERLA, EOMI  Neck: Supple, no JVD Endocrine: No thryomegaly Cardiac: Normal S1, S2; RRR; no murmurs, rubs, or gallops Lungs: Clear to auscultation bilaterally, no wheezing, rhonchi or rales  Abd: Soft, nontender, no hepatomegaly  Ext: Trace edema left lower extremity Musculoskeletal: No deformities, BUE and BLE strength normal and equal Skin: Warm and dry, no rashes   Neuro: Alert and oriented to person, place, time, and situation, CNII-XII grossly intact, no focal deficits  Psych: Normal mood and affect   ASSESSMENT:   Peter Barnett is a 62 y.o. male who presents for the following: 1. Coronary artery disease involving native coronary artery of native heart without angina pectoris   2. Myalgia due to statin   3. Mixed hyperlipidemia   4. Persistent atrial fibrillation (HCC)   5. Presence of Watchman left atrial appendage closure device     PLAN:   1. Coronary artery  disease involving native coronary artery of native heart without angina pectoris 2. Myalgia due to statin 3. Mixed hyperlipidemia -History of PCI x 2.  Not on aspirin due to intracerebral hemorrhage history.  Intolerant of PCSK9 inhibitors.  Reports A1c increased.  Tolerating Crestor 40 mg every other day.  He will have repeat lipids checked in September.  He will forward [place] results.  No symptoms of angina.  On metoprolol.  Overall doing well. -He takes torsemide for lower extremity edema.  Should continue this. -EKG shows longstanding history of left anterior fascicular block.  No change today.  4. Persistent atrial fibrillation (HCC) 5. Presence of Watchman left atrial appendage closure device -History of atrial fibrillation and flutter status post ablation.  Status post watchman due to history of intracerebral hemorrhage.  No recurrence of arrhythmia.  Overall doing well.       Disposition: Return in about 1 year (around 08/16/2023).  Medication Adjustments/Labs and Tests Ordered: Current medicines are reviewed at length with the patient today.  Concerns regarding medicines are outlined above.  Orders Placed This Encounter  Procedures   EKG 12-Lead   Meds ordered this encounter  Medications   KLOR-CON M20 20 MEQ tablet    Sig: Take 1 tablet (20 mEq total) by mouth as needed. When  you are taking torsemide.    Dispense:  90 tablet    Refill:  3   torsemide (DEMADEX) 20 MG tablet  Sig: Take 1 tablet (20 mg total) by mouth as needed.    Dispense:  90 tablet    Refill:  0   rosuvastatin (CRESTOR) 40 MG tablet    Sig: Take 1 tablet (40 mg total) by mouth every other day.    Dispense:  50 tablet    Refill:  3   Patient Instructions  Medication Instructions:    Rosuvastatin 20 mg  every other day     Torsemide as needed along with potassium as needed    *If you need a refill on your cardiac medications before your next appointment, please call your pharmacy*   Lab  Work:  Not needed   Testing/Procedures: Not needed   Follow-Up: At [facility], you and your health needs are our priority.  As part of our continuing mission to provide you with exceptional heart care, we have created designated Provider Care Teams.  These Care Teams include your primary Cardiologist (physician) and Advanced Practice Providers (APPs -  Physician Assistants and Nurse Practitioners) who all work together to provide you with the care you need, when you need it.     Your next appointment:   12 month(s)  The format for your next appointment:   In Person  Provider:   Reatha Harps, MD      Time Spent with Patient: I have spent a total of 25 minutes with patient reviewing hospital notes, telemetry, EKGs, labs and examining the patient as well as establishing an assessment and plan that was discussed with the patient.  > 50% of time was spent in direct patient care.  Signed, Lenna Gilford. Flora Lipps, MD, [facility]  [facility]  [address] [place], [place] [address] [phone]  08/16/2022 9:26 AM

## 2022-08-09 DIAGNOSIS — I480 Paroxysmal atrial fibrillation: Secondary | ICD-10-CM | POA: Diagnosis not present

## 2022-08-09 DIAGNOSIS — J439 Emphysema, unspecified: Secondary | ICD-10-CM | POA: Diagnosis not present

## 2022-08-09 DIAGNOSIS — I5032 Chronic diastolic (congestive) heart failure: Secondary | ICD-10-CM | POA: Diagnosis not present

## 2022-08-09 DIAGNOSIS — G40909 Epilepsy, unspecified, not intractable, without status epilepticus: Secondary | ICD-10-CM | POA: Diagnosis not present

## 2022-08-16 ENCOUNTER — Encounter: Payer: Self-pay | Admitting: Cardiovascular Disease

## 2022-08-16 ENCOUNTER — Ambulatory Visit: Payer: Medicare HMO | Admitting: Cardiovascular Disease

## 2022-08-16 VITALS — BP 110/66 | HR 54 | Ht 71.0 in | Wt 251.4 lb

## 2022-08-16 DIAGNOSIS — T466X5D Adverse effect of antihyperlipidemic and antiarteriosclerotic drugs, subsequent encounter: Secondary | ICD-10-CM

## 2022-08-16 DIAGNOSIS — M791 Myalgia, unspecified site: Secondary | ICD-10-CM | POA: Diagnosis not present

## 2022-08-16 DIAGNOSIS — I4819 Other persistent atrial fibrillation: Secondary | ICD-10-CM | POA: Diagnosis not present

## 2022-08-16 DIAGNOSIS — Z95818 Presence of other cardiac implants and grafts: Secondary | ICD-10-CM

## 2022-08-16 DIAGNOSIS — E782 Mixed hyperlipidemia: Secondary | ICD-10-CM

## 2022-08-16 DIAGNOSIS — I2510 Atherosclerotic heart disease of native coronary artery without angina pectoris: Secondary | ICD-10-CM | POA: Diagnosis not present

## 2022-08-16 MED ORDER — KLOR-CON M20 20 MEQ PO TBCR: 20.0000 meq | EXTENDED_RELEASE_TABLET | ORAL | 3 refills | Status: DC | PRN

## 2022-08-16 MED ORDER — ROSUVASTATIN CALCIUM 40 MG PO TABS: 40.0000 mg | ORAL_TABLET | ORAL | 3 refills | Status: DC

## 2022-08-16 MED ORDER — TORSEMIDE 20 MG PO TABS: 20.0000 mg | ORAL_TABLET | ORAL | 0 refills | Status: DC | PRN

## 2022-08-16 NOTE — Patient Instructions (Addendum)
Medication Instructions:    Rosuvastatin 20 mg  every other day     Torsemide as needed along with potassium as needed    *If you need a refill on your cardiac medications before your next appointment, please call your pharmacy*   Lab Work:  Not needed   Testing/Procedures: Not needed   Follow-Up: At [facility], you and your health needs are our priority.  As part of our continuing mission to provide you with exceptional heart care, we have created designated Provider Care Teams.  These Care Teams include your primary Cardiologist (physician) and Advanced Practice Providers (APPs -  Physician Assistants and Nurse Practitioners) who all work together to provide you with the care you need, when you need it.     Your next appointment:   12 month(s)  The format for your next appointment:   In Person  Provider:   Reatha Harps, MD

## 2022-10-19 DIAGNOSIS — I480 Paroxysmal atrial fibrillation: Secondary | ICD-10-CM | POA: Diagnosis not present

## 2022-10-19 DIAGNOSIS — F1120 Opioid dependence, uncomplicated: Secondary | ICD-10-CM | POA: Diagnosis not present

## 2022-10-19 DIAGNOSIS — Z8774 Personal history of (corrected) congenital malformations of heart and circulatory system: Secondary | ICD-10-CM | POA: Diagnosis not present

## 2022-10-19 DIAGNOSIS — M5442 Lumbago with sciatica, left side: Secondary | ICD-10-CM | POA: Diagnosis not present

## 2022-10-19 DIAGNOSIS — Z8679 Personal history of other diseases of the circulatory system: Secondary | ICD-10-CM | POA: Diagnosis not present

## 2022-10-19 DIAGNOSIS — R519 Headache, unspecified: Secondary | ICD-10-CM | POA: Diagnosis not present

## 2022-10-19 DIAGNOSIS — G8929 Other chronic pain: Secondary | ICD-10-CM | POA: Diagnosis not present

## 2022-10-22 ENCOUNTER — Other Ambulatory Visit: Payer: Self-pay | Admitting: Interventional Cardiology

## 2022-10-28 ENCOUNTER — Other Ambulatory Visit: Payer: Self-pay | Admitting: Cardiovascular Disease

## 2022-11-12 DIAGNOSIS — Z8679 Personal history of other diseases of the circulatory system: Secondary | ICD-10-CM | POA: Diagnosis not present

## 2022-11-12 DIAGNOSIS — R519 Headache, unspecified: Secondary | ICD-10-CM | POA: Diagnosis not present

## 2022-11-12 DIAGNOSIS — Z8774 Personal history of (corrected) congenital malformations of heart and circulatory system: Secondary | ICD-10-CM | POA: Diagnosis not present

## 2022-11-15 DIAGNOSIS — E119 Type 2 diabetes mellitus without complications: Secondary | ICD-10-CM | POA: Diagnosis not present

## 2022-11-15 DIAGNOSIS — I10 Essential (primary) hypertension: Secondary | ICD-10-CM | POA: Diagnosis not present

## 2022-11-26 ENCOUNTER — Other Ambulatory Visit: Payer: Self-pay | Admitting: Medical Genetics

## 2022-11-26 DIAGNOSIS — Z006 Encounter for examination for normal comparison and control in clinical research program: Secondary | ICD-10-CM

## 2022-12-02 ENCOUNTER — Other Ambulatory Visit (HOSPITAL_COMMUNITY): Payer: Medicare HMO

## 2022-12-13 ENCOUNTER — Other Ambulatory Visit (HOSPITAL_COMMUNITY)
Admission: RE | Admit: 2022-12-13 | Discharge: 2022-12-13 | Disposition: A | Discharge status: Home / Self Care | Payer: Medicare HMO | Source: Ambulatory Visit | Attending: Oncology | Admitting: Oncology

## 2022-12-13 DIAGNOSIS — Z006 Encounter for examination for normal comparison and control in clinical research program: Secondary | ICD-10-CM | POA: Insufficient documentation

## 2022-12-24 LAB — HELIX MOLECULAR SCREEN: Genetic Analysis Overall Interpretation: NEGATIVE

## 2022-12-24 LAB — GENECONNECT MOLECULAR SCREEN

## 2023-01-14 DIAGNOSIS — Z008 Encounter for other general examination: Secondary | ICD-10-CM | POA: Diagnosis not present

## 2023-01-20 DIAGNOSIS — H526 Other disorders of refraction: Secondary | ICD-10-CM | POA: Diagnosis not present

## 2023-01-20 DIAGNOSIS — H43813 Vitreous degeneration, bilateral: Secondary | ICD-10-CM | POA: Diagnosis not present

## 2023-01-20 DIAGNOSIS — H40013 Open angle with borderline findings, low risk, bilateral: Secondary | ICD-10-CM | POA: Diagnosis not present

## 2023-01-20 DIAGNOSIS — H25813 Combined forms of age-related cataract, bilateral: Secondary | ICD-10-CM | POA: Diagnosis not present

## 2023-01-20 DIAGNOSIS — E119 Type 2 diabetes mellitus without complications: Secondary | ICD-10-CM | POA: Diagnosis not present

## 2023-01-20 DIAGNOSIS — H11151 Pinguecula, right eye: Secondary | ICD-10-CM | POA: Diagnosis not present

## 2023-01-30 ENCOUNTER — Other Ambulatory Visit: Payer: Self-pay | Admitting: Cardiovascular Disease

## 2023-01-31 MED ORDER — KLOR-CON M20 20 MEQ PO TBCR: 20.0000 meq | EXTENDED_RELEASE_TABLET | ORAL | 1 refills | Status: DC | PRN

## 2023-02-24 DIAGNOSIS — E119 Type 2 diabetes mellitus without complications: Secondary | ICD-10-CM | POA: Diagnosis not present

## 2023-02-24 DIAGNOSIS — G40909 Epilepsy, unspecified, not intractable, without status epilepticus: Secondary | ICD-10-CM | POA: Diagnosis not present

## 2023-02-24 DIAGNOSIS — I5032 Chronic diastolic (congestive) heart failure: Secondary | ICD-10-CM | POA: Diagnosis not present

## 2023-02-24 DIAGNOSIS — G8929 Other chronic pain: Secondary | ICD-10-CM | POA: Diagnosis not present

## 2023-02-24 DIAGNOSIS — M5442 Lumbago with sciatica, left side: Secondary | ICD-10-CM | POA: Diagnosis not present

## 2023-02-24 DIAGNOSIS — I480 Paroxysmal atrial fibrillation: Secondary | ICD-10-CM | POA: Diagnosis not present

## 2023-02-24 DIAGNOSIS — Z1330 Encounter for screening examination for mental health and behavioral disorders, unspecified: Secondary | ICD-10-CM | POA: Diagnosis not present

## 2023-02-24 DIAGNOSIS — F1120 Opioid dependence, uncomplicated: Secondary | ICD-10-CM | POA: Diagnosis not present

## 2023-02-24 DIAGNOSIS — J439 Emphysema, unspecified: Secondary | ICD-10-CM | POA: Diagnosis not present

## 2023-04-30 ENCOUNTER — Other Ambulatory Visit: Payer: Self-pay | Admitting: Cardiovascular Disease

## 2023-08-30 ENCOUNTER — Other Ambulatory Visit: Payer: Self-pay | Admitting: Cardiovascular Disease

## 2023-09-26 ENCOUNTER — Other Ambulatory Visit: Payer: Self-pay | Admitting: Cardiovascular Disease

## 2023-09-27 ENCOUNTER — Encounter: Payer: Self-pay | Admitting: Cardiovascular Disease

## 2023-09-27 ENCOUNTER — Other Ambulatory Visit: Payer: Self-pay

## 2023-09-27 MED ORDER — KLOR-CON M20 20 MEQ PO TBCR: 20.0000 meq | EXTENDED_RELEASE_TABLET | ORAL | 0 refills | Status: AC | PRN

## 2023-09-27 NOTE — Telephone Encounter (Signed)
 RX sent in
